# Patient Record
Sex: Female | Born: 1960
Health system: Southern US, Community
[De-identification: ages and names within clinical notes are randomized; demographics above are authoritative.]

## PROBLEM LIST (undated history)

## (undated) DIAGNOSIS — R011 Cardiac murmur, unspecified: Secondary | ICD-10-CM

## (undated) DIAGNOSIS — I341 Nonrheumatic mitral (valve) prolapse: Secondary | ICD-10-CM

## (undated) DIAGNOSIS — N200 Calculus of kidney: Secondary | ICD-10-CM

## (undated) DIAGNOSIS — E785 Hyperlipidemia, unspecified: Secondary | ICD-10-CM

## (undated) DIAGNOSIS — E119 Type 2 diabetes mellitus without complications: Secondary | ICD-10-CM

## (undated) DIAGNOSIS — I10 Essential (primary) hypertension: Secondary | ICD-10-CM

## (undated) HISTORY — PX: BACK SURGERY: SHX140

## (undated) HISTORY — PX: CARDIAC CATHETERIZATION: SHX172

## (undated) HISTORY — PX: BREAST SURGERY: SHX581

## (undated) HISTORY — PX: TONSILLECTOMY: SUR1361

## (undated) HISTORY — PX: TUBAL LIGATION: SHX77

## (undated) HISTORY — PX: TRIGGER FINGER RELEASE: SHX641

## (undated) HISTORY — DX: Hyperlipidemia, unspecified: E78.5

## (undated) HISTORY — PX: CHOLECYSTECTOMY: SHX55

## (undated) HISTORY — PX: CARPAL TUNNEL RELEASE: SHX101

## (undated) HISTORY — DX: Type 2 diabetes mellitus without complications: E11.9

---

## 2001-05-06 ENCOUNTER — Encounter: Admission: RE | Admit: 2001-05-06 | Discharge: 2001-05-06 | Payer: Self-pay | Admitting: Internal Medicine

## 2001-05-06 ENCOUNTER — Encounter: Payer: Self-pay | Admitting: Internal Medicine

## 2001-05-12 ENCOUNTER — Encounter: Payer: Self-pay | Admitting: Urology

## 2001-05-12 ENCOUNTER — Encounter: Admission: RE | Admit: 2001-05-12 | Discharge: 2001-05-12 | Payer: Self-pay | Admitting: Urology

## 2001-05-21 ENCOUNTER — Encounter (HOSPITAL_BASED_OUTPATIENT_CLINIC_OR_DEPARTMENT_OTHER): Payer: Self-pay | Admitting: General Surgery

## 2001-05-23 ENCOUNTER — Ambulatory Visit (HOSPITAL_COMMUNITY): Admission: RE | Admit: 2001-05-23 | Discharge: 2001-05-24 | Payer: Self-pay | Admitting: General Surgery

## 2001-05-23 ENCOUNTER — Encounter (HOSPITAL_BASED_OUTPATIENT_CLINIC_OR_DEPARTMENT_OTHER): Payer: Self-pay | Admitting: General Surgery

## 2001-05-23 ENCOUNTER — Encounter (INDEPENDENT_AMBULATORY_CARE_PROVIDER_SITE_OTHER): Payer: Self-pay | Admitting: *Deleted

## 2001-11-18 ENCOUNTER — Other Ambulatory Visit: Admission: RE | Admit: 2001-11-18 | Discharge: 2001-11-18 | Payer: Self-pay | Admitting: Internal Medicine

## 2005-12-18 ENCOUNTER — Ambulatory Visit (HOSPITAL_COMMUNITY): Admission: RE | Admit: 2005-12-18 | Discharge: 2005-12-18 | Payer: Self-pay | Admitting: Internal Medicine

## 2006-01-08 ENCOUNTER — Encounter: Admission: RE | Admit: 2006-01-08 | Discharge: 2006-01-08 | Payer: Self-pay | Admitting: Internal Medicine

## 2008-03-22 ENCOUNTER — Encounter: Admission: RE | Admit: 2008-03-22 | Discharge: 2008-03-22 | Payer: Self-pay | Admitting: Internal Medicine

## 2008-06-02 ENCOUNTER — Encounter: Admission: RE | Admit: 2008-06-02 | Discharge: 2008-06-02 | Payer: Self-pay | Admitting: Internal Medicine

## 2008-07-23 ENCOUNTER — Ambulatory Visit (HOSPITAL_COMMUNITY): Admission: RE | Admit: 2008-07-23 | Discharge: 2008-07-23 | Payer: Self-pay | Admitting: Urology

## 2008-12-08 ENCOUNTER — Ambulatory Visit (HOSPITAL_COMMUNITY): Admission: RE | Admit: 2008-12-08 | Discharge: 2008-12-08 | Payer: Self-pay | Admitting: Neurosurgery

## 2009-01-12 ENCOUNTER — Ambulatory Visit (HOSPITAL_COMMUNITY): Admission: RE | Admit: 2009-01-12 | Discharge: 2009-01-13 | Payer: Self-pay | Admitting: Neurosurgery

## 2009-04-05 ENCOUNTER — Encounter: Admission: RE | Admit: 2009-04-05 | Discharge: 2009-04-05 | Payer: Self-pay | Admitting: Internal Medicine

## 2010-04-10 ENCOUNTER — Encounter: Admission: RE | Admit: 2010-04-10 | Discharge: 2010-04-10 | Payer: Self-pay | Admitting: Internal Medicine

## 2011-01-14 ENCOUNTER — Encounter: Payer: Self-pay | Admitting: Internal Medicine

## 2011-03-28 ENCOUNTER — Other Ambulatory Visit: Payer: Self-pay | Admitting: Internal Medicine

## 2011-03-28 ENCOUNTER — Ambulatory Visit
Admission: RE | Admit: 2011-03-28 | Discharge: 2011-03-28 | Disposition: A | Discharge status: Home / Self Care | Payer: 59 | Source: Ambulatory Visit | Attending: Internal Medicine | Admitting: Internal Medicine

## 2011-03-28 DIAGNOSIS — M79646 Pain in unspecified finger(s): Secondary | ICD-10-CM

## 2011-04-09 LAB — BASIC METABOLIC PANEL
Calcium: 9.2 mg/dL (ref 8.4–10.5)
Chloride: 109 mEq/L (ref 96–112)
Creatinine, Ser: 0.75 mg/dL (ref 0.4–1.2)
GFR calc non Af Amer: >60 mL/min (ref >60)
Glucose, Bld: 96 mg/dL (ref 70–99)
Potassium: 4.1 mEq/L (ref 3.5–5.1)

## 2011-04-09 LAB — CBC
Hemoglobin: 12.7 g/dL (ref 12.0–15.0)
RDW: 14.1 % (ref 11.5–15.5)

## 2011-04-30 ENCOUNTER — Other Ambulatory Visit: Payer: Self-pay | Admitting: Internal Medicine

## 2011-04-30 DIAGNOSIS — Z1231 Encounter for screening mammogram for malignant neoplasm of breast: Secondary | ICD-10-CM

## 2011-05-04 ENCOUNTER — Ambulatory Visit
Admission: RE | Admit: 2011-05-04 | Discharge: 2011-05-04 | Disposition: A | Discharge status: Home / Self Care | Payer: 59 | Source: Ambulatory Visit | Attending: Internal Medicine | Admitting: Internal Medicine

## 2011-05-04 DIAGNOSIS — Z1231 Encounter for screening mammogram for malignant neoplasm of breast: Secondary | ICD-10-CM

## 2011-05-08 NOTE — Op Note (Signed)
NAME:  Mary Holt, Mary Holt               ACCOUNT NO.:  [id number]   MEDICAL RECORD NO.:  [id number]          PATIENT TYPE:  OIB   LOCATION:  3528                         FACILITY:  [facility]   PHYSICIAN:  [name], M.D.     DATE OF BIRTH:  1961-02-03   DATE OF PROCEDURE:  01/12/2009  DATE OF DISCHARGE:                               OPERATIVE REPORT   PREOPERATIVE DIAGNOSES:  1. Lumbar stenosis L4-5.  2. Right L4-L5 radiculopathy.   POSTOPERATIVE DIAGNOSES:  1. Lumbar stenosis L4-5.  2. Right L4-L5 radiculopathy.   PROCEDURE:  Right L4-5 semi-hemilaminectomy with decompression of L4 and  L5 roots.   COMPLICATIONS:  None.   SURGEON:  [name], MD   ASSISTANT:  Danae Orleans. Venetia Maxon, MD   INDICATIONS:  Mary Holt presented with pain in the right lower  extremity with standing and walking.  Myelogram suggested a hang up at  L4-5 level.  After much discussion, she and I agreed that it might be  worthwhile to undergo decompression.   OPERATIVE NOTE:  [name] was brought to the operating room intubated  and placed under general anesthetic.  Her back was prepped after she was  turned prone onto a Tompkins frame.  All pressure points appropriately  padded.  I infiltrated 0.5% lidocaine with 1:200,000 strength  epinephrine.  I opened the skin with a #10 blade and took this down to  the thoracolumbar fascia.  I then exposed the lamina on the right side  of L4-L5.  After localizing that with x-ray, I then proceeded with a  semi hemi-laminectomy using high-speed drill and Kerrison punches.  I  removed the ligamentum flavum and then thoroughly decompressed the L4-L5  roots.  I then irrigated the wound.  Dr. Venetia Maxon assisted with  decompression.  We then closed the wound in layered fashion using Vicryl  sutures.  Dermabond used for sterile dressing.           ______________________________  [name], M.D.     KC/MEDQ  D:  01/12/2009  T:  01/13/2009  Job:  [id number]

## 2011-05-11 NOTE — Op Note (Signed)
[place]  Patient:    Mary Holt, Mary Holt                      MRN: [id number] Proc. Date: 05/23/01 Adm. Date:  [id number] Attending:  Sonda Primes                           Operative Report  PREOPERATIVE DIAGNOSIS:  Chronic calculous cholecystitis.  POSTOPERATIVE DIAGNOSIS:  Chronic calculous cholecystitis.  PROCEDURE:  Laparoscopic cholecystectomy with intraoperative cholangiogram.  SURGEON:  Luisa Hart L. Lurene Shadow, M.D.  ASSISTANT:  Marnee Spring. Wiliam Ke, M.D.  ANESTHESIA:  General.  CLINICAL NOTE:  The patient is a 50 year old woman presenting with upper abdominal symptoms which on ultrasonography and CT scanning of the abdomen shows cholelithiasis.  The CT scan, though, showed no obstructing nephrolithiasis although she did have a history of kidney stones.  Liver function studies are all within normal limits.  She is now brought to the operating room for a laparoscopic cholecystectomy.  DESCRIPTION OF PROCEDURE:  Following the induction of satisfactory general anesthesia with the patient positioned supinely, the abdomen was prepped and draped routinely.  Open laparoscopy of the umbilicus is created, and a Hasson cannula is inserted.  Visual exploration of the abdomen showed the gallbladder to be chronically scarred, the liver edges sharp, liver surfaces smooth, anterior gastric wall appeared to be normal.  The duodenum was somewhat tethered to the gallbladder and had to be dissected away.  Pelvic organs, the uterus was noted to be normal size.  There were several small fibroids noted. The adnexal structures were not visualized.  None of the small or large intestine appeared to be abnormal.  Under direct vision, epigastric, midclavicular, and anterior axillary ports were placed.  The gallbladder was grasped and retracted cephalad with the patient in the slightly head-up position and tilted toward the left. Dissection carried down to the  region of the ampulla.  After adhesions were removed from the gallbladder, the cystic artery and cystic duct were dissected free and traced up to their entry into the gallbladder.  The cystic artery was doubly clipped and transected.  The cystic duct was clipped proximally and opened.  I inserted a 14-gauge Angiocath into the abdomen and through it passed a Reddick cholangiocatheter, through which I injected one-half strength Hypaque into the cystic duct.  The resulting fluoroscopic cholangiogram showed free flow of contrast into the duodenum, no filling defects, normal caliber of ducts.  The cholangiocatheter was removed and the cystic duct doubly clipped and transected.  The gallbladder was then dissected free from the liver bed using electrocautery and maintaining hemostasis along the course of dissection.  By the end of dissection, the liver bed was again inspected. Additional bleeding points were treated with electrocautery.  The right upper quadrant was thoroughly irrigated with normal saline and aspirated.  The camera removed through the epigastric port and the gallbladder retrieved through the umbilical port without difficulty.  The trocars were then removed under direct vision.  Sponge, instrument, and sharp counts were verified and pneumoperitoneum allowed to deflate with the assistance of Valsalva maneuvers from the anesthetist.  The wounds were then closed in layers as follows: Umbilical wound in two layers with 0 Dexon and 4-0 Dexon.  Epigastric and lateral flank wounds closed with 4-0 Dexon suture.  Then all wounds were then reinforced with Steri-Strips.  Sterile dressings were applied to the wounds, the anesthetic reversed, and  the patient removed from the operating room to the recovery room in stable condition.  She tolerated the procedure well. DD:  05/23/01 TD:  05/23/01 Job: [id number]

## 2011-10-26 ENCOUNTER — Emergency Department (HOSPITAL_COMMUNITY): Payer: 59

## 2011-10-26 ENCOUNTER — Emergency Department (HOSPITAL_COMMUNITY)
Admission: EM | Admit: 2011-10-26 | Discharge: 2011-10-26 | Disposition: A | Discharge status: Home / Self Care | Payer: 59 | Attending: Emergency Medicine | Admitting: Emergency Medicine

## 2011-10-26 DIAGNOSIS — I10 Essential (primary) hypertension: Secondary | ICD-10-CM | POA: Insufficient documentation

## 2011-10-26 DIAGNOSIS — R51 Headache: Secondary | ICD-10-CM | POA: Insufficient documentation

## 2011-10-26 DIAGNOSIS — R209 Unspecified disturbances of skin sensation: Secondary | ICD-10-CM | POA: Insufficient documentation

## 2011-10-26 DIAGNOSIS — G510 Bell's palsy: Secondary | ICD-10-CM | POA: Insufficient documentation

## 2011-10-26 LAB — CBC
HCT: 35.4 % — ABNORMAL LOW (ref 36.0–46.0)
Hemoglobin: 11.8 g/dL — ABNORMAL LOW (ref 12.0–15.0)
MCH: 28.2 pg (ref 26.0–34.0)
MCHC: 33.3 g/dL (ref 30.0–36.0)
Platelets: 301 10*3/uL (ref 150–400)
RBC: 4.18 MIL/uL (ref 3.87–5.11)

## 2011-10-26 LAB — DIFFERENTIAL
Basophils Absolute: 0 10*3/uL (ref 0.0–0.1)
Lymphocytes Relative: 27 % (ref 12–46)
Lymphs Abs: 1.6 10*3/uL (ref 0.7–4.0)
Monocytes Relative: 6 % (ref 3–12)
Neutro Abs: 4 10*3/uL (ref 1.7–7.7)
Neutrophils Relative %: 65 % (ref 43–77)

## 2011-10-26 LAB — BASIC METABOLIC PANEL
CO2: 25 mEq/L (ref 19–32)
Calcium: 9.1 mg/dL (ref 8.4–10.5)
Chloride: 104 mEq/L (ref 96–112)
GFR calc Af Amer: >90 mL/min (ref >90)
GFR calc non Af Amer: >90 mL/min (ref >90)

## 2011-10-26 LAB — POCT I-STAT, CHEM 8
BUN: 13 mg/dL (ref 6–23)
Chloride: 107 mEq/L (ref 96–112)
Hemoglobin: 12.9 g/dL (ref 12.0–15.0)
Potassium: 3.5 mEq/L (ref 3.5–5.1)
TCO2: 22 mmol/L (ref 0–100)

## 2011-10-26 LAB — POCT I-STAT TROPONIN I: Troponin i, poc: 0 ng/mL (ref 0.00–0.08)

## 2013-01-16 ENCOUNTER — Emergency Department (HOSPITAL_COMMUNITY)
Admission: EM | Admit: 2013-01-16 | Discharge: 2013-01-16 | Disposition: A | Discharge status: Home Health | Payer: 59 | Attending: Emergency Medicine | Admitting: Emergency Medicine

## 2013-01-16 ENCOUNTER — Encounter (HOSPITAL_COMMUNITY): Payer: Self-pay | Admitting: *Deleted

## 2013-01-16 ENCOUNTER — Emergency Department (HOSPITAL_COMMUNITY): Payer: 59

## 2013-01-16 DIAGNOSIS — N390 Urinary tract infection, site not specified: Secondary | ICD-10-CM | POA: Insufficient documentation

## 2013-01-16 DIAGNOSIS — N200 Calculus of kidney: Secondary | ICD-10-CM | POA: Insufficient documentation

## 2013-01-16 DIAGNOSIS — I10 Essential (primary) hypertension: Secondary | ICD-10-CM | POA: Insufficient documentation

## 2013-01-16 DIAGNOSIS — R319 Hematuria, unspecified: Secondary | ICD-10-CM | POA: Insufficient documentation

## 2013-01-16 DIAGNOSIS — R112 Nausea with vomiting, unspecified: Secondary | ICD-10-CM | POA: Insufficient documentation

## 2013-01-16 HISTORY — DX: Calculus of kidney: N20.0

## 2013-01-16 HISTORY — DX: Essential (primary) hypertension: I10

## 2013-01-16 LAB — CBC WITH DIFFERENTIAL/PLATELET
Basophils Relative: 0 % (ref 0–1)
Eosinophils Absolute: 0.1 10*3/uL (ref 0.0–0.7)
HCT: 36.7 % (ref 36.0–46.0)
Hemoglobin: 11.8 g/dL — ABNORMAL LOW (ref 12.0–15.0)
MCH: 25.1 pg — ABNORMAL LOW (ref 26.0–34.0)
MCHC: 32.2 g/dL (ref 30.0–36.0)
Monocytes Absolute: 0.9 10*3/uL (ref 0.1–1.0)
Monocytes Relative: 6 % (ref 3–12)

## 2013-01-16 LAB — COMPREHENSIVE METABOLIC PANEL
AST: 15 U/L (ref 0–37)
Albumin: 3.7 g/dL (ref 3.5–5.2)
CO2: 23 mEq/L (ref 19–32)
Calcium: 8.8 mg/dL (ref 8.4–10.5)
Creatinine, Ser: 1.13 mg/dL — ABNORMAL HIGH (ref 0.50–1.10)
GFR calc non Af Amer: 55 mL/min — ABNORMAL LOW (ref >90)

## 2013-01-16 LAB — URINALYSIS, ROUTINE W REFLEX MICROSCOPIC
Bilirubin Urine: NEGATIVE
Glucose, UA: NEGATIVE mg/dL
Ketones, ur: 15 mg/dL — AB
pH: 5.5 (ref 5.0–8.0)

## 2013-01-16 LAB — LIPASE, BLOOD: Lipase: 26 U/L (ref 11–59)

## 2013-01-16 LAB — URINE MICROSCOPIC-ADD ON

## 2013-01-16 LAB — POCT I-STAT, CHEM 8
Calcium, Ion: 1.14 mmol/L (ref 1.12–1.23)
Glucose, Bld: 144 mg/dL — ABNORMAL HIGH (ref 70–99)
HCT: 39 % (ref 36.0–46.0)
Hemoglobin: 13.3 g/dL (ref 12.0–15.0)

## 2013-01-16 MED ORDER — SODIUM CHLORIDE 0.9 % IV SOLN
Freq: Once | INTRAVENOUS | Status: AC
  Administered 2013-01-16: 100 mL/h via INTRAVENOUS

## 2013-01-16 MED ORDER — TAMSULOSIN HCL 0.4 MG PO CAPS
0.4000 mg | ORAL_CAPSULE | Freq: Once | ORAL | Status: AC
  Administered 2013-01-16: 0.4 mg via ORAL
  Filled 2013-01-16: qty 1

## 2013-01-16 MED ORDER — TAMSULOSIN HCL 0.4 MG PO CAPS: 0.4000 mg | ORAL_CAPSULE | Freq: Every day | ORAL | Status: DC

## 2013-01-16 MED ORDER — ONDANSETRON HCL 4 MG/2ML IJ SOLN
4.0000 mg | Freq: Once | INTRAMUSCULAR | Status: AC
  Administered 2013-01-16: 4 mg via INTRAVENOUS
  Filled 2013-01-16: qty 2

## 2013-01-16 MED ORDER — MORPHINE SULFATE 4 MG/ML IJ SOLN
4.0000 mg | Freq: Once | INTRAMUSCULAR | Status: AC
  Administered 2013-01-16: 4 mg via INTRAVENOUS
  Filled 2013-01-16: qty 1

## 2013-01-16 MED ORDER — CIPROFLOXACIN HCL 500 MG PO TABS: 500.0000 mg | ORAL_TABLET | Freq: Two times a day (BID) | ORAL | Status: DC

## 2013-01-16 MED ORDER — OXYCODONE-ACETAMINOPHEN 5-325 MG PO TABS
1.0000 | ORAL_TABLET | Freq: Once | ORAL | Status: AC
  Administered 2013-01-16: 1 via ORAL
  Filled 2013-01-16 (×2): qty 1

## 2013-01-16 MED ORDER — IBUPROFEN 600 MG PO TABS: 600.0000 mg | ORAL_TABLET | Freq: Four times a day (QID) | ORAL | Status: DC | PRN

## 2013-01-16 MED ORDER — ONDANSETRON 8 MG PO TBDP: 8.0000 mg | ORAL_TABLET | Freq: Three times a day (TID) | ORAL | Status: DC | PRN

## 2013-01-16 MED ORDER — OXYCODONE-ACETAMINOPHEN 5-325 MG PO TABS: 1.0000 | ORAL_TABLET | ORAL | Status: DC | PRN

## 2013-01-16 MED ORDER — KETOROLAC TROMETHAMINE 30 MG/ML IJ SOLN
30.0000 mg | Freq: Once | INTRAMUSCULAR | Status: AC
  Administered 2013-01-16: 30 mg via INTRAVENOUS
  Filled 2013-01-16: qty 1

## 2013-01-16 NOTE — ED Notes (Signed)
Patient states left sided flank pain starting since Wednesday, patient states n/v/d associated, patient states history of kidney stones and pain feels similiar

## 2013-01-16 NOTE — ED Provider Notes (Signed)
Medical screening examination/treatment/procedure(s) were performed by non-physician practitioner and as supervising physician I was immediately available for consultation/collaboration.  Sunnie Nielsen, MD 01/16/13 2251

## 2013-01-16 NOTE — ED Provider Notes (Signed)
History     CSN: [id number]  Arrival date & time 01/16/13  0604   First MD Initiated Contact with Patient 01/16/13 [phone]      Chief Complaint  Patient presents with  . Flank Pain    (Consider location/radiation/quality/duration/timing/severity/associated sxs/prior treatment) HPI Comments: Mary Holt is a 52 y.o. Female with a PMHx that presents to ER with c/o flank pain since Wednesday.  She states that it is on the left side only and feels that it might be starting to radiate down to her pubic area.  The pain is intermittent, but when present the patient describes at as stabbing and is currently at a 7-8/10 on the pain scale.  She has tried ibuprofen for the pain with only mild relief, and she states that nothing makes the pain worse.  She has had kidney stones in the past and she states that she feels that this feels similar to those.  She  had nausea and vomiting along with the pain on Wednesday and is also "going to the bathroom more" but denies diarrhea.  She says that she feels like she might be urinating some blood, as well, but is not having any dysuria.   She denies fever, headache, sore throat, body aches, melena, and hematochezia.   Patient is a 52 y.o. female presenting with flank pain. The history is provided by the patient.  Flank Pain This is a new problem. The current episode started in the past 7 days. The problem occurs intermittently. The problem has been unchanged. Associated symptoms include a change in bowel habit, nausea and vomiting. Pertinent negatives include no abdominal pain, anorexia, arthralgias, chest pain, chills, congestion, coughing, diaphoresis, fatigue, fever, headaches, joint swelling, myalgias, neck pain, numbness, rash, sore throat, swollen glands, urinary symptoms, vertigo, visual change or weakness. Nothing aggravates the symptoms. She has tried NSAIDs for the symptoms. The treatment provided mild relief.    Past Medical History  Diagnosis Date  .  Kidney stones   . Hypertension     Past Surgical History  Procedure Date  . Back surgery     No family history on file.  History  Substance Use Topics  . Smoking status: Never Smoker   . Smokeless tobacco: Not on file  . Alcohol Use: No    OB History    Grav Para Term Preterm Abortions TAB SAB Ect Mult Living                  Review of Systems  Constitutional: Negative.  Negative for fever, chills, diaphoresis and fatigue.  HENT: Negative.  Negative for congestion, sore throat and neck pain.   Eyes: Negative.   Respiratory: Negative.  Negative for cough.   Cardiovascular: Negative.  Negative for chest pain.  Gastrointestinal: Positive for nausea, vomiting and change in bowel habit. Negative for abdominal pain, diarrhea, constipation, blood in stool and anorexia.       N/V with flank pain on Wed; states that she is "going to bathroom more" but not really diarrhea  Genitourinary: Positive for hematuria and flank pain. Negative for dysuria, urgency, frequency, decreased urine volume, difficulty urinating and pelvic pain.  Musculoskeletal: Negative.  Negative for myalgias, joint swelling and arthralgias.  Skin: Negative.  Negative for rash.  Neurological: Negative.  Negative for vertigo, weakness, numbness and headaches.  Hematological: Negative.   Psychiatric/Behavioral: Negative.   All other systems reviewed and are negative.    Allergies  Penicillins  Home Medications  No current outpatient prescriptions  on file.  BP 168/95  Pulse 85  Temp 98.5 F (36.9 C) (Oral)  Resp 18  SpO2 98%  Physical Exam  Nursing note and vitals reviewed. Constitutional: She is oriented to person, place, and time. She appears well-developed and well-nourished. She appears distressed.       PT is uncomfortable  HENT:  Head: Normocephalic and atraumatic.  Eyes: Conjunctivae normal and EOM are normal. Pupils are equal, round, and reactive to light. No scleral icterus.  Neck: Normal  range of motion. Neck supple.  Cardiovascular: Normal rate, regular rhythm and normal heart sounds.   Pulmonary/Chest: Effort normal and breath sounds normal. No stridor.  Abdominal: Soft. Bowel sounds are normal. There is no tenderness. There is CVA tenderness.  Genitourinary:       Positive CVA tenderness of left side.  No suprapubic tenderness.  Musculoskeletal: Normal range of motion.  Neurological: She is alert and oriented to person, place, and time.  Skin: Skin is warm and dry. She is not diaphoretic.  Psychiatric: She has a normal mood and affect. Her behavior is normal.    ED Course  Procedures (including critical care time)  Pt with left flank pain, n/v, hx of kidney stones. Will get labs, UA.  Results for orders placed during the hospital encounter of 01/16/13  URINALYSIS, ROUTINE W REFLEX MICROSCOPIC      Component Value Range   Color, Urine YELLOW  YELLOW   APPearance CLOUDY (*) CLEAR   Specific Gravity, Urine 1.017  1.005 - 1.030   pH 5.5  5.0 - 8.0   Glucose, UA NEGATIVE  NEGATIVE mg/dL   Hgb urine dipstick LARGE (*) NEGATIVE   Bilirubin Urine NEGATIVE  NEGATIVE   Ketones, ur 15 (*) NEGATIVE mg/dL   Protein, ur NEGATIVE  NEGATIVE mg/dL   Urobilinogen, UA 0.2  0.0 - 1.0 mg/dL   Nitrite NEGATIVE  NEGATIVE   Leukocytes, UA SMALL (*) NEGATIVE  CBC WITH DIFFERENTIAL      Component Value Range   WBC 15.7 (*) 4.0 - 10.5 K/uL   RBC 4.71  3.87 - 5.11 MIL/uL   Hemoglobin 11.8 (*) 12.0 - 15.0 g/dL   HCT 29.5  28.4 - 13.2 %   MCV 77.9 (*) 78.0 - 100.0 fL   MCH 25.1 (*) 26.0 - 34.0 pg   MCHC 32.2  30.0 - 36.0 g/dL   RDW 44.0 (*) 10.2 - 72.5 %   Platelets 324  150 - 400 K/uL   Neutrophils Relative 84 (*) 43 - 77 %   Neutro Abs 13.2 (*) 1.7 - 7.7 K/uL   Lymphocytes Relative 10 (*) 12 - 46 %   Lymphs Abs 1.5  0.7 - 4.0 K/uL   Monocytes Relative 6  3 - 12 %   Monocytes Absolute 0.9  0.1 - 1.0 K/uL   Eosinophils Relative 0  0 - 5 %   Eosinophils Absolute 0.1  0.0 - 0.7  K/uL   Basophils Relative 0  0 - 1 %   Basophils Absolute 0.0  0.0 - 0.1 K/uL  POCT I-STAT, CHEM 8      Component Value Range   Sodium 139  135 - 145 mEq/L   Potassium 3.8  3.5 - 5.1 mEq/L   Chloride 107  96 - 112 mEq/L   BUN 18  6 - 23 mg/dL   Creatinine, Ser 3.66 (*) 0.50 - 1.10 mg/dL   Glucose, Bld 440 (*) 70 - 99 mg/dL   Calcium, Ion 3.47  1.12 - 1.23 mmol/L   TCO2 23  0 - 100 mmol/L   Hemoglobin 13.3  12.0 - 15.0 g/dL   HCT 16.1  09.6 - 04.5 %  COMPREHENSIVE METABOLIC PANEL      Component Value Range   Sodium 139  135 - 145 mEq/L   Potassium 3.7  3.5 - 5.1 mEq/L   Chloride 104  96 - 112 mEq/L   CO2 23  19 - 32 mEq/L   Glucose, Bld 138 (*) 70 - 99 mg/dL   BUN 17  6 - 23 mg/dL   Creatinine, Ser 4.09 (*) 0.50 - 1.10 mg/dL   Calcium 8.8  8.4 - 81.1 mg/dL   Total Protein 7.1  6.0 - 8.3 g/dL   Albumin 3.7  3.5 - 5.2 g/dL   AST 15  0 - 37 U/L   ALT 15  0 - 35 U/L   Alkaline Phosphatase 74  39 - 117 U/L   Total Bilirubin 0.3  0.3 - 1.2 mg/dL   GFR calc non Af Amer 55 (*) >90 mL/min   GFR calc Af Amer 64 (*) >90 mL/min  LIPASE, BLOOD      Component Value Range   Lipase 26  11 - 59 U/L  URINE MICROSCOPIC-ADD ON      Component Value Range   Squamous Epithelial / LPF FEW (*) RARE   WBC, UA 11-20  <3 WBC/hpf   RBC / HPF 3-6  <3 RBC/hpf   Bacteria, UA MANY (*) RARE    Suspect a kidney stone vs pyelonephritis. Will get CT    Ct Abdomen Pelvis Wo Contrast  01/16/2013  *RADIOLOGY REPORT*  Clinical Data: Left flank pain.  CT ABDOMEN AND PELVIS WITHOUT CONTRAST  Technique:  Multidetector CT imaging of the abdomen and pelvis was performed following the standard protocol without intravenous contrast.  Comparison: 07/15/2008  Findings: Minimal dependent atelectasis in the lung bases.  Heart is upper limits normal in size.  No effusions.  Prior cholecystectomy.  Liver, spleen, pancreas, adrenals have an unremarkable unenhanced appearance.  Bilateral nephrolithiasis.  No hydronephrosis  or ureteral stones on the right.  Mild left hydronephrosis and perinephric stranding.  There are numerous calcifications in the pelvis, most of which are stable since 2009 and compatible with phleboliths.  I suspect there are two distal left ureteral stones adjacent to one another, both measuring approximately 6 mm in diameter.  Urinary bladder is unremarkable.  Low density centrally within the uterus, presumably fluid within the endometrium.  No adnexal masses.  No free fluid, free air or adenopathy.  Stomach, large and small bowel are grossly unremarkable.  No acute bony abnormality.  IMPRESSION: Mild left hydronephrosis and perinephric stranding.  Extensive calcifications in the pelvis compatible with phleboliths.  There are 2 new calcifications in the region of the distal left ureter/left UVJ which I suspect are adjacent 6 mm distal left ureteral stones.   Original Report Authenticated By: Charlett Nose, M.D.      1. Kidney stone on left side   2. UTI (lower urinary tract infection)       MDM  PT with left sided flank pain, n/v, hematuria. Hx of kidney stones. Pt does have leukocytosis with contaminated, possibly infected UA. CT obtained to r/o kidney stone, and shows a 6mm stone at UVJ. Pt's pain improved in ED with 30mg  of toradol and 4mg  of morphine IV. She received a dose of flomax and percocet as well. I discussed pt with Dr. Retta Diones  who recommend treatment with cipro, pain medications, follow up in the office on Monday. [facility] to [facility] ED or call on call physician if fever, worsening pain, vomiting. Discussed with pt. She agrees with the plan. At this time afebrile, non toxic. No vomiting in ED.   Filed Vitals:   01/16/13 0939  BP: 132/79  Pulse: 85  Temp:   Resp: 18           Marra Fraga A Shylo Dillenbeck, PA 01/16/13 1243

## 2013-01-18 LAB — URINE CULTURE

## 2013-01-19 NOTE — ED Notes (Signed)
+  Urine. Patient treated with Cipro. Sensitive to same. Per protocol MD. °

## 2013-03-23 ENCOUNTER — Other Ambulatory Visit: Payer: Self-pay | Admitting: Internal Medicine

## 2013-03-23 DIAGNOSIS — M549 Dorsalgia, unspecified: Secondary | ICD-10-CM

## 2013-03-26 ENCOUNTER — Ambulatory Visit
Admission: RE | Admit: 2013-03-26 | Discharge: 2013-03-26 | Disposition: A | Discharge status: Home / Self Care | Payer: 59 | Source: Ambulatory Visit | Attending: Internal Medicine | Admitting: Internal Medicine

## 2013-03-26 DIAGNOSIS — M549 Dorsalgia, unspecified: Secondary | ICD-10-CM

## 2013-03-26 MED ORDER — GADOBENATE DIMEGLUMINE 529 MG/ML IV SOLN
15.0000 mL | Freq: Once | INTRAVENOUS | Status: AC | PRN
  Administered 2013-03-26: 15 mL via INTRAVENOUS

## 2013-04-28 ENCOUNTER — Other Ambulatory Visit: Payer: Self-pay | Admitting: Neurosurgery

## 2013-04-30 ENCOUNTER — Encounter (HOSPITAL_COMMUNITY): Payer: Self-pay | Admitting: Pharmacy Technician

## 2013-05-06 ENCOUNTER — Ambulatory Visit (HOSPITAL_COMMUNITY)
Admission: RE | Admit: 2013-05-06 | Discharge: 2013-05-06 | Disposition: A | Discharge status: Home / Self Care | Payer: 59 | Source: Ambulatory Visit | Attending: Anesthesiology | Admitting: Anesthesiology

## 2013-05-06 ENCOUNTER — Encounter (HOSPITAL_COMMUNITY): Payer: Self-pay

## 2013-05-06 ENCOUNTER — Encounter (HOSPITAL_COMMUNITY)
Admission: RE | Admit: 2013-05-06 | Discharge: 2013-05-06 | Disposition: A | Discharge status: Home / Self Care | Payer: 59 | Source: Ambulatory Visit | Attending: Neurosurgery | Admitting: Neurosurgery

## 2013-05-06 DIAGNOSIS — Z01812 Encounter for preprocedural laboratory examination: Secondary | ICD-10-CM | POA: Insufficient documentation

## 2013-05-06 DIAGNOSIS — I10 Essential (primary) hypertension: Secondary | ICD-10-CM | POA: Insufficient documentation

## 2013-05-06 DIAGNOSIS — Z01818 Encounter for other preprocedural examination: Secondary | ICD-10-CM | POA: Insufficient documentation

## 2013-05-06 DIAGNOSIS — Z01810 Encounter for preprocedural cardiovascular examination: Secondary | ICD-10-CM | POA: Insufficient documentation

## 2013-05-06 HISTORY — DX: Cardiac murmur, unspecified: R01.1

## 2013-05-06 HISTORY — DX: Nonrheumatic mitral (valve) prolapse: I34.1

## 2013-05-06 LAB — BASIC METABOLIC PANEL
CO2: 25 mEq/L (ref 19–32)
Calcium: 9.3 mg/dL (ref 8.4–10.5)
Chloride: 104 mEq/L (ref 96–112)
Creatinine, Ser: 0.82 mg/dL (ref 0.50–1.10)
GFR calc Af Amer: >90 mL/min (ref >90)
Sodium: 140 mEq/L (ref 135–145)

## 2013-05-06 LAB — SURGICAL PCR SCREEN
MRSA, PCR: NEGATIVE
Staphylococcus aureus: POSITIVE — AB

## 2013-05-06 LAB — ABO/RH: ABO/RH(D): A POS

## 2013-05-06 LAB — CBC
HCT: 35.5 % — ABNORMAL LOW (ref 36.0–46.0)
MCV: 78 fL (ref 78.0–100.0)
Platelets: 432 10*3/uL — ABNORMAL HIGH (ref 150–400)
RBC: 4.55 MIL/uL (ref 3.87–5.11)
RDW: 15 % (ref 11.5–15.5)
WBC: 9.1 10*3/uL (ref 4.0–10.5)

## 2013-05-06 LAB — TYPE AND SCREEN: Antibody Screen: NEGATIVE

## 2013-05-06 NOTE — Pre-Procedure Instructions (Signed)
NIANG MITCHELTREE  05/06/2013   Your procedure is scheduled on:  Friday, May 16th.  Report to [facility] at 7:30 AM.  Call this number if you have problems the morning of surgery: [phone]   Remember:   Do not eat food or drink liquids after midnight.   Take these medicines the morning of surgery with A SIP OF WATER: Metorprolo.   Do not wear jewelry, make-up or nail polish.  Do not wear lotions, powders, or perfumes. You may wear deodorant.  Do not shave 48 hours prior to surgery.   Do not bring valuables to the hospital.  Contacts, dentures or bridgework may not be worn into surgery.  Leave suitcase in the car. After surgery it may be brought to your room.  For patients admitted to the hospital, checkout time is 11:00 AM the day of discharge.     Special Instructions: Shower using CHG 2 nights before surgery and the night before surgery.  If you shower the day of surgery use CHG.  Use special wash - you have one bottle of CHG for all showers.  You should use approximately 1/3 of the bottle for each shower.   Please read over the following fact sheets that you were given: Pain Booklet, Coughing and Deep Breathing, Blood Transfusion Information and Surgical Site Infection Prevention

## 2013-05-07 MED ORDER — VANCOMYCIN HCL IN DEXTROSE 1-5 GM/200ML-% IV SOLN
1000.0000 mg | INTRAVENOUS | Status: AC
  Administered 2013-05-08: 1000 mg via INTRAVENOUS
  Filled 2013-05-07: qty 200

## 2013-05-08 ENCOUNTER — Encounter (HOSPITAL_COMMUNITY): Payer: Self-pay | Admitting: Anesthesiology

## 2013-05-08 ENCOUNTER — Encounter (HOSPITAL_COMMUNITY): Payer: Self-pay | Admitting: *Deleted

## 2013-05-08 ENCOUNTER — Inpatient Hospital Stay (HOSPITAL_COMMUNITY)
Admission: RE | Admit: 2013-05-08 | Discharge: 2013-05-13 | DRG: 460 | Disposition: A | Discharge status: Home / Self Care | Payer: 59 | Source: Ambulatory Visit | Attending: Neurosurgery | Admitting: Neurosurgery

## 2013-05-08 ENCOUNTER — Ambulatory Visit (HOSPITAL_COMMUNITY): Payer: 59 | Admitting: Anesthesiology

## 2013-05-08 ENCOUNTER — Encounter (HOSPITAL_COMMUNITY)
Admission: RE | Disposition: A | Discharge status: Home / Self Care | Payer: Self-pay | Source: Ambulatory Visit | Attending: Neurosurgery

## 2013-05-08 ENCOUNTER — Ambulatory Visit (HOSPITAL_COMMUNITY): Payer: 59

## 2013-05-08 DIAGNOSIS — I059 Rheumatic mitral valve disease, unspecified: Secondary | ICD-10-CM | POA: Diagnosis present

## 2013-05-08 DIAGNOSIS — I10 Essential (primary) hypertension: Secondary | ICD-10-CM | POA: Diagnosis present

## 2013-05-08 DIAGNOSIS — K5900 Constipation, unspecified: Secondary | ICD-10-CM | POA: Diagnosis present

## 2013-05-08 DIAGNOSIS — M48062 Spinal stenosis, lumbar region with neurogenic claudication: Principal | ICD-10-CM | POA: Diagnosis present

## 2013-05-08 DIAGNOSIS — Z87442 Personal history of urinary calculi: Secondary | ICD-10-CM

## 2013-05-08 DIAGNOSIS — Q762 Congenital spondylolisthesis: Secondary | ICD-10-CM

## 2013-05-08 SURGERY — POSTERIOR LUMBAR FUSION 1 LEVEL
Anesthesia: General | Site: Back | Wound class: Clean

## 2013-05-08 MED ORDER — LIDOCAINE-EPINEPHRINE 0.5 %-1:200000 IJ SOLN
INTRAMUSCULAR | Status: DC | PRN
  Administered 2013-05-08: 10 mL

## 2013-05-08 MED ORDER — BISACODYL 5 MG PO TBEC
5.0000 mg | DELAYED_RELEASE_TABLET | Freq: Every day | ORAL | Status: DC | PRN
  Administered 2013-05-11: 5 mg via ORAL
  Filled 2013-05-08: qty 1

## 2013-05-08 MED ORDER — METOPROLOL SUCCINATE ER 25 MG PO TB24
25.0000 mg | ORAL_TABLET | Freq: Every day | ORAL | Status: DC
  Administered 2013-05-08 – 2013-05-13 (×5): 25 mg via ORAL
  Filled 2013-05-08 (×6): qty 1

## 2013-05-08 MED ORDER — LACTATED RINGERS IV SOLN
INTRAVENOUS | Status: DC | PRN
  Administered 2013-05-08 (×4): via INTRAVENOUS

## 2013-05-08 MED ORDER — PHENOL 1.4 % MT LIQD: 1.0000 | OROMUCOSAL | Status: DC | PRN

## 2013-05-08 MED ORDER — METOCLOPRAMIDE HCL 5 MG/ML IJ SOLN: 10.0000 mg | Freq: Once | INTRAMUSCULAR | Status: DC | PRN

## 2013-05-08 MED ORDER — HYDROMORPHONE HCL PF 1 MG/ML IJ SOLN
0.2500 mg | INTRAMUSCULAR | Status: DC | PRN
  Administered 2013-05-08 (×4): 0.5 mg via INTRAVENOUS

## 2013-05-08 MED ORDER — 0.9 % SODIUM CHLORIDE (POUR BTL) OPTIME
TOPICAL | Status: DC | PRN
  Administered 2013-05-08: 1000 mL

## 2013-05-08 MED ORDER — SODIUM CHLORIDE 0.9 % IJ SOLN: 3.0000 mL | INTRAMUSCULAR | Status: DC | PRN

## 2013-05-08 MED ORDER — PHENYLEPHRINE HCL 10 MG/ML IJ SOLN
INTRAMUSCULAR | Status: DC | PRN
  Administered 2013-05-08 (×2): 80 ug via INTRAVENOUS
  Administered 2013-05-08: 40 ug via INTRAVENOUS

## 2013-05-08 MED ORDER — PROPOFOL 10 MG/ML IV BOLUS
INTRAVENOUS | Status: DC | PRN
  Administered 2013-05-08: 150 mg via INTRAVENOUS
  Administered 2013-05-08: 50 mg via INTRAVENOUS

## 2013-05-08 MED ORDER — MIDAZOLAM HCL 5 MG/5ML IJ SOLN
INTRAMUSCULAR | Status: DC | PRN
  Administered 2013-05-08: 2 mg via INTRAVENOUS

## 2013-05-08 MED ORDER — SODIUM CHLORIDE 0.9 % IV SOLN
250.0000 mL | INTRAVENOUS | Status: DC
  Administered 2013-05-11: 250 mL via INTRAVENOUS

## 2013-05-08 MED ORDER — OXYCODONE HCL 5 MG PO TABS
5.0000 mg | ORAL_TABLET | ORAL | Status: DC | PRN
  Administered 2013-05-08 – 2013-05-10 (×8): 10 mg via ORAL
  Administered 2013-05-10: 5 mg via ORAL
  Administered 2013-05-11 – 2013-05-13 (×10): 10 mg via ORAL
  Filled 2013-05-08 (×19): qty 2

## 2013-05-08 MED ORDER — THROMBIN 20000 UNITS EX SOLR
CUTANEOUS | Status: DC | PRN
  Administered 2013-05-08: 12:00:00 via TOPICAL

## 2013-05-08 MED ORDER — OXYCODONE HCL 5 MG PO TABS
5.0000 mg | ORAL_TABLET | Freq: Once | ORAL | Status: AC | PRN
  Administered 2013-05-08: 5 mg via ORAL

## 2013-05-08 MED ORDER — FENTANYL CITRATE 0.05 MG/ML IJ SOLN
INTRAMUSCULAR | Status: DC | PRN
  Administered 2013-05-08: 50 ug via INTRAVENOUS
  Administered 2013-05-08: 100 ug via INTRAVENOUS
  Administered 2013-05-08 (×4): 50 ug via INTRAVENOUS
  Administered 2013-05-08: 150 ug via INTRAVENOUS

## 2013-05-08 MED ORDER — OXYCODONE HCL 5 MG PO TABS
ORAL_TABLET | ORAL | Status: AC
  Filled 2013-05-08: qty 1

## 2013-05-08 MED ORDER — OXYCODONE HCL 5 MG/5ML PO SOLN: 5.0000 mg | Freq: Once | ORAL | Status: AC | PRN

## 2013-05-08 MED ORDER — HYDROMORPHONE HCL PF 1 MG/ML IJ SOLN
INTRAMUSCULAR | Status: AC
  Filled 2013-05-08: qty 1

## 2013-05-08 MED ORDER — SENNA 8.6 MG PO TABS
1.0000 | ORAL_TABLET | Freq: Two times a day (BID) | ORAL | Status: DC
  Administered 2013-05-08 – 2013-05-13 (×7): 8.6 mg via ORAL
  Filled 2013-05-08 (×11): qty 1

## 2013-05-08 MED ORDER — VANCOMYCIN HCL 10 G IV SOLR
1250.0000 mg | Freq: Once | INTRAVENOUS | Status: AC
  Administered 2013-05-08: 1250 mg via INTRAVENOUS
  Filled 2013-05-08: qty 1250

## 2013-05-08 MED ORDER — ONDANSETRON HCL 4 MG/2ML IJ SOLN
4.0000 mg | INTRAMUSCULAR | Status: DC | PRN
  Administered 2013-05-09 (×2): 4 mg via INTRAVENOUS
  Filled 2013-05-08 (×3): qty 2

## 2013-05-08 MED ORDER — PHENYLEPHRINE HCL 10 MG/ML IJ SOLN
10.0000 mg | INTRAVENOUS | Status: DC | PRN
  Administered 2013-05-08: 25 ug/min via INTRAVENOUS

## 2013-05-08 MED ORDER — POLYETHYLENE GLYCOL 3350 17 G PO PACK
17.0000 g | PACK | Freq: Every day | ORAL | Status: DC | PRN
  Filled 2013-05-08: qty 1

## 2013-05-08 MED ORDER — MENTHOL 3 MG MT LOZG: 1.0000 | LOZENGE | OROMUCOSAL | Status: DC | PRN

## 2013-05-08 MED ORDER — POTASSIUM CHLORIDE IN NACL 20-0.9 MEQ/L-% IV SOLN
INTRAVENOUS | Status: DC
  Administered 2013-05-09: 06:00:00 via INTRAVENOUS
  Filled 2013-05-08 (×11): qty 1000

## 2013-05-08 MED ORDER — ROCURONIUM BROMIDE 100 MG/10ML IV SOLN
INTRAVENOUS | Status: DC | PRN
  Administered 2013-05-08: 60 mg via INTRAVENOUS
  Administered 2013-05-08: 20 mg via INTRAVENOUS

## 2013-05-08 MED ORDER — ACETAMINOPHEN 10 MG/ML IV SOLN
1000.0000 mg | Freq: Four times a day (QID) | INTRAVENOUS | Status: AC
  Administered 2013-05-08 – 2013-05-09 (×4): 1000 mg via INTRAVENOUS
  Filled 2013-05-08 (×4): qty 100

## 2013-05-08 MED ORDER — DIPHENHYDRAMINE HCL 50 MG/ML IJ SOLN
25.0000 mg | Freq: Four times a day (QID) | INTRAMUSCULAR | Status: DC | PRN
  Administered 2013-05-08: 25 mg via INTRAVENOUS
  Administered 2013-05-11: 50 mg via INTRAVENOUS
  Administered 2013-05-12 (×2): 25 mg via INTRAVENOUS
  Filled 2013-05-08 (×4): qty 1

## 2013-05-08 MED ORDER — SODIUM CHLORIDE 0.9 % IJ SOLN
3.0000 mL | Freq: Two times a day (BID) | INTRAMUSCULAR | Status: DC
  Administered 2013-05-10 – 2013-05-12 (×5): 3 mL via INTRAVENOUS

## 2013-05-08 MED ORDER — LIDOCAINE HCL (CARDIAC) 20 MG/ML IV SOLN
INTRAVENOUS | Status: DC | PRN
  Administered 2013-05-08: 50 mg via INTRAVENOUS

## 2013-05-08 MED ORDER — MORPHINE SULFATE 2 MG/ML IJ SOLN
2.0000 mg | INTRAMUSCULAR | Status: DC | PRN
  Administered 2013-05-08 – 2013-05-10 (×6): 2 mg via INTRAVENOUS
  Administered 2013-05-10: 4 mg via INTRAVENOUS
  Filled 2013-05-08 (×3): qty 1
  Filled 2013-05-08: qty 2
  Filled 2013-05-08 (×3): qty 1

## 2013-05-08 SURGICAL SUPPLY — 69 items
ADH SKN CLS APL DERMABOND .7 (GAUZE/BANDAGES/DRESSINGS) ×1
APL SKNCLS STERI-STRIP NONHPOA (GAUZE/BANDAGES/DRESSINGS)
BENZOIN TINCTURE PRP APPL 2/3 (GAUZE/BANDAGES/DRESSINGS) IMPLANT
BLADE SURG ROTATE 9660 (MISCELLANEOUS) IMPLANT
BONE MATRIX OSTEOCEL PLUS 5CC (Bone Implant) ×2 IMPLANT
BUR MATCHSTICK NEURO 3.0 LAGG (BURR) ×2 IMPLANT
CAGE COROENT 12X28X4 (Cage) ×2 IMPLANT
CANISTER SUCTION 2500CC (MISCELLANEOUS) ×2 IMPLANT
CLOTH BEACON ORANGE TIMEOUT ST (SAFETY) ×2 IMPLANT
CONT SPEC 4OZ CLIKSEAL STRL BL (MISCELLANEOUS) ×4 IMPLANT
COVER BACK TABLE 24X17X13 BIG (DRAPES) IMPLANT
COVER TABLE BACK 60X90 (DRAPES) ×2 IMPLANT
DECANTER SPIKE VIAL GLASS SM (MISCELLANEOUS) IMPLANT
DERMABOND ADVANCED (GAUZE/BANDAGES/DRESSINGS) ×1
DERMABOND ADVANCED .7 DNX12 (GAUZE/BANDAGES/DRESSINGS) ×1 IMPLANT
DRAPE C-ARM 42X72 X-RAY (DRAPES) ×4 IMPLANT
DRAPE C-ARMOR (DRAPES) ×4 IMPLANT
DRAPE LAPAROTOMY 100X72X124 (DRAPES) ×2 IMPLANT
DRAPE POUCH INSTRU U-SHP 10X18 (DRAPES) ×2 IMPLANT
DRAPE SURG 17X23 STRL (DRAPES) ×2 IMPLANT
DRESSING TELFA 8X3 (GAUZE/BANDAGES/DRESSINGS) IMPLANT
DURAPREP 26ML APPLICATOR (WOUND CARE) ×2 IMPLANT
ELECT REM PT RETURN 9FT ADLT (ELECTROSURGICAL) ×2
ELECTRODE REM PT RTRN 9FT ADLT (ELECTROSURGICAL) ×1 IMPLANT
GAUZE SPONGE 4X4 16PLY XRAY LF (GAUZE/BANDAGES/DRESSINGS) IMPLANT
GLOVE BIOGEL PI IND STRL 6.5 (GLOVE) ×1 IMPLANT
GLOVE BIOGEL PI IND STRL 7.5 (GLOVE) ×3 IMPLANT
GLOVE BIOGEL PI INDICATOR 6.5 (GLOVE) ×1
GLOVE BIOGEL PI INDICATOR 7.5 (GLOVE) ×3
GLOVE ECLIPSE 6.5 STRL STRAW (GLOVE) ×6 IMPLANT
GLOVE ECLIPSE 7.5 STRL STRAW (GLOVE) ×8 IMPLANT
GLOVE ECLIPSE 8.5 STRL (GLOVE) ×2 IMPLANT
GLOVE EXAM NITRILE LRG STRL (GLOVE) IMPLANT
GLOVE EXAM NITRILE MD LF STRL (GLOVE) ×4 IMPLANT
GLOVE EXAM NITRILE XL STR (GLOVE) IMPLANT
GLOVE EXAM NITRILE XS STR PU (GLOVE) IMPLANT
GLOVE SURG SS PI 6.5 STRL IVOR (GLOVE) ×2 IMPLANT
GOWN BRE IMP SLV AUR LG STRL (GOWN DISPOSABLE) ×6 IMPLANT
GOWN BRE IMP SLV AUR XL STRL (GOWN DISPOSABLE) ×6 IMPLANT
GOWN STRL REIN 2XL LVL4 (GOWN DISPOSABLE) IMPLANT
KIT BASIN OR (CUSTOM PROCEDURE TRAY) ×2 IMPLANT
KIT POSITION SURG JACKSON T1 (MISCELLANEOUS) IMPLANT
KIT ROOM TURNOVER OR (KITS) ×2 IMPLANT
NEEDLE HYPO 25X1 1.5 SAFETY (NEEDLE) ×2 IMPLANT
NEEDLE SPNL 18GX3.5 QUINCKE PK (NEEDLE) ×2 IMPLANT
NS IRRIG 1000ML POUR BTL (IV SOLUTION) ×2 IMPLANT
PACK LAMINECTOMY NEURO (CUSTOM PROCEDURE TRAY) ×2 IMPLANT
PAD ARMBOARD 7.5X6 YLW CONV (MISCELLANEOUS) ×6 IMPLANT
ROD 5.5X40MM (Rod) ×2 IMPLANT
ROD PREBENT 45MM LUMBAR (Rod) ×2 IMPLANT
SCREW LOCK (Screw) ×8 IMPLANT
SCREW LOCK FXNS SPNE MAS PL (Screw) ×4 IMPLANT
SCREW SHANK 5.0X30MM (Screw) ×8 IMPLANT
SCREW TULIP 5.5 (Screw) ×8 IMPLANT
SLEEVE SURGEON STRL (DRAPES) ×2 IMPLANT
SPONGE GAUZE 4X4 12PLY (GAUZE/BANDAGES/DRESSINGS) IMPLANT
SPONGE LAP 4X18 X RAY DECT (DISPOSABLE) IMPLANT
SPONGE SURGIFOAM ABS GEL 100 (HEMOSTASIS) ×2 IMPLANT
STRIP CLOSURE SKIN 1/2X4 (GAUZE/BANDAGES/DRESSINGS) IMPLANT
SUT PROLENE 6 0 BV (SUTURE) IMPLANT
SUT VIC AB 0 CT1 18XCR BRD8 (SUTURE) ×1 IMPLANT
SUT VIC AB 0 CT1 8-18 (SUTURE) ×2
SUT VIC AB 2-0 CT1 18 (SUTURE) ×2 IMPLANT
SUT VIC AB 3-0 SH 8-18 (SUTURE) ×2 IMPLANT
SYR 20ML ECCENTRIC (SYRINGE) ×2 IMPLANT
TOWEL OR 17X24 6PK STRL BLUE (TOWEL DISPOSABLE) ×2 IMPLANT
TOWEL OR 17X26 10 PK STRL BLUE (TOWEL DISPOSABLE) ×2 IMPLANT
TRAY FOLEY CATH 14FRSI W/METER (CATHETERS) ×2 IMPLANT
WATER STERILE IRR 1000ML POUR (IV SOLUTION) ×2 IMPLANT

## 2013-05-08 NOTE — H&P (Signed)
  BP 168/93  Pulse 87  Temp(Src) 97.1 F (36.2 C) (Oral)  Resp 20  SpO2 99%  LMP 04/23/2013 CC:L4/5 stenosis Mary Holt presents with pain in the lumbar spine, and severe stenosis at L4/5 and spondylolisthesis at the same level. She had previously undergone a lumbar decompression at L4/5 and had done well for a number of years. Now she has symptoms consistent with neurogenic claudication and radiculopathy due to lateral recess stenosis.  Allergies  Allergen Reactions  . Nabumetone Rash  . Penicillins Rash   Past Medical History  Diagnosis Date  . Kidney stones   . Hypertension   . Heart murmur   . Mitral valve prolapse   . Kidney stones     several times, last  one 2014   Past Surgical History  Procedure Laterality Date  . Back surgery    . Trigger finger release      left thumb  . Tonsillectomy    . Breast surgery      cyst right breast  . Tubal ligation     Prior to Admission medications   Medication Sig Start Date End Date Taking? Authorizing Provider  metoprolol succinate (TOPROL-XL) 25 MG 24 hr tablet Take 25 mg by mouth daily.   Yes Historical Provider, MD   Physical Exam  Constitutional: She is oriented to person, place, and time. She appears well-developed and well-nourished. No distress.  HENT:  Head: Normocephalic and atraumatic.  Right Ear: External ear normal.  Left Ear: External ear normal.  Nose: Nose normal.  Mouth/Throat: Oropharynx is clear and moist.  Eyes: Conjunctivae and EOM are normal. Pupils are equal, round, and reactive to light.  Neck: Normal range of motion. Neck supple.  Cardiovascular: Normal rate, regular rhythm and normal heart sounds.   Pulmonary/Chest: Effort normal and breath sounds normal.  Abdominal: Soft. Bowel sounds are normal.  Neurological: She is alert and oriented to person, place, and time. She has normal reflexes. She displays normal reflexes. No cranial nerve deficit. She exhibits normal muscle tone. Coordination normal.   Skin: Skin is warm and dry.  Psychiatric: She has a normal mood and affect. Her behavior is normal. Judgment and thought content normal.  Assessment/Plan Mary Holt has had conservative treatment without pain relief. She had decided

## 2013-05-08 NOTE — Anesthesia Procedure Notes (Signed)
Procedure Name: Intubation Date/Time: 05/08/2013 11:48 AM Performed by: Alanda Amass A Pre-anesthesia Checklist: Patient identified, Timeout performed, Emergency Drugs available, Patient being monitored and Suction available Patient Re-evaluated:Patient Re-evaluated prior to inductionOxygen Delivery Method: Circle system utilized Preoxygenation: Pre-oxygenation with 100% oxygen Intubation Type: IV induction Ventilation: Mask ventilation without difficulty Laryngoscope Size: Mac and 3 Grade View: Grade I Tube type: Oral Tube size: 7.5 mm Number of attempts: 1 Airway Equipment and Method: Stylet Placement Confirmation: ETT inserted through vocal cords under direct vision,  breath sounds checked- equal and bilateral and positive ETCO2 Secured at: 21 cm Tube secured with: Tape Dental Injury: Teeth and Oropharynx as per pre-operative assessment

## 2013-05-08 NOTE — Anesthesia Postprocedure Evaluation (Signed)
Anesthesia Post Note  Patient: Mary Holt  Procedure(s) Performed: Procedure(s) (LRB): POSTERIOR LUMBAR FUSION 1 LEVEL (N/A)  Anesthesia type: General  Patient location: PACU  Post pain: Pain level controlled and Adequate analgesia  Post assessment: Post-op Vital signs reviewed, Patient's Cardiovascular Status Stable, Respiratory Function Stable, Patent Airway and Pain level controlled  Last Vitals:  Filed Vitals:   05/08/13 1555  BP: 164/97  Pulse: 94  Temp:   Resp: 16    Post vital signs: Reviewed and stable  Level of consciousness: awake, alert  and oriented  Complications: No apparent anesthesia complications

## 2013-05-08 NOTE — Op Note (Addendum)
05/08/2013  3:49 PM  PATIENT:  Mary Holt  52 y.o. female with pain in the lower back and left buttocks. She has symptoms which are consistent with neurogenic claudication and left lumbar radiculopathy. He does have some complaints on the right but they are much less and much he complains of the left side appeared I. therefore plan on taking her to the operating room for a left-sided decompression and pedicle screw fixation at L4-5. He does have spondylolisthesis present at L4-5.  PRE-OPERATIVE DIAGNOSIS:  Spondylolisthesis L4-5, lumbar stenosis L4-5  POST-OPERATIVE DIAGNOSIS:  Spondylolisthesis L4 far, lumbar stenosis L4-5  PROCEDURE:  Procedure(s): POSTERIOR LUMBAR iNTERBODY FUSION 1 LEVEL L4-5, Peek cage placement Nuvasive, 12mm Lumbar decompression beyond what is necessary for a PLIF L4-5, decompression of the L4 and L5 nerve rootS Pedicle screw fixation L4/5, non segmental, medial to lateral trajectory. [email or web address]  SURGEON:  Surgeon(s): Carmela Hurt, MD Temple Pacini, MD  ASSISTANTS:Pool, Sherilyn Cooter  ANESTHESIA:   general  EBL:  Total I/O In: 2900 [I.V.:2900] Out: 1000 [Urine:850; Blood:150]  BLOOD ADMINISTERED:none  CELL SAVER GIVEN:none   COUNT:per nursing  DRAINS: none   SPECIMEN:  No Specimen  DICTATION: Mary Holt was taken to the operating room, intubated and placed under a general anesthetic without difficulty. She was positioned prone on a Reznick frame with all pressure points properly padded. Her back was prepped and draped in a sterile manner. I used her old incision as a starting point. I opened this with a 10 blade and then exposed the lamina of L3-L4 and L5 bilaterally. I exposed the pars intra-articularis of L4 and L5 bilaterally. I then brought the fluoroscopy unit into the operative field.  With fluoroscopic guidance I placed medial to lateral pedicle screws at L4 and L5. I used nuvasive hardware. I used the drill to create a pilot hole, then the  hand drill to create a pathway for the screw the check the integrity of the each final pilot hole and did not appreciate a pedicle cutout. I tapped each hole with a 5.0 mm tap, check the integrity of the pedicle once more, then placed the screws.  The screws were assessed in both the AP and lateral planes and all appeared to be in good position.  I then saw to my decompression. I use a high-speed drill to perform a complete inferior facetectomy of L4 on the left side and partial facetectomy of the superior facet of L5 to I was able to then identify the disc space after removing the ligamentum flavum and exposing the thecal sac. I performed a discectomy at the L4-5 space on the left side. I did not do this on the right side as I had previously done a discectomy there, and most of her pain was on the left side. I prepared the disc space for arthrodesis with a combination of cataracts disc space shavers, rasps, and Kerrison punches. I noted the space. I then placed a 12 mm cage with morselized autograft. I also placed autograft into the disc space prior to the cage placement.  I completed my decompression of the L4 root on the left side with a combination of Kerrison punches and the high-speed drill. I completely took down the facet well beyond the disc space to make sure that there was room for the free egress of the L4 root and of the L5 root. I did use some bone work on the right side removing a portion of the superior facets of L5  and the inferior facet of L4 I did this to ensure that there was no bony compression,  there appeared to be none on the preoperative studies. With the decompression now complete and the posterior lumbar interbody arthrodesis complete I turned my attention to the posterolateral arthrodesis. I decorticated the L4-5 facet on the right side appeared then placed morselized allograft over the decorticated bone  I placed the rods and locking caps for the screw rod system. I secured  everything into position. I then irrigated. I then closed the wound but Dr. Lindalou Hose assistance in layered fashion reapproximating the thoracolumbar subcutaneous and subcuticular layers. I used Dermabond for a sterile dressing.  PLAN OF CARE: Admit to inpatient   PATIENT DISPOSITION:  PACU - hemodynamically stable.   Delay start of Pharmacological VTE agent (>24hrs) due to surgical blood loss or risk of bleeding:  yes

## 2013-05-08 NOTE — Transfer of Care (Signed)
Immediate Anesthesia Transfer of Care Note  Patient: Mary Holt  Procedure(s) Performed: Procedure(s) with comments: POSTERIOR LUMBAR FUSION 1 LEVEL (N/A) - Posterior Lumbar Four-Five Interbody and Fusion with Interbody Prothesis, Posterolateral Arthrodesis, and Posterior Nonsegmental Instrumentation  Patient Location: PACU  Anesthesia Type:General  Level of Consciousness: sedated  Airway & Oxygen Therapy: Patient Spontanous Breathing and Patient connected to nasal cannula oxygen  Post-op Assessment: Report given to PACU RN and Post -op Vital signs reviewed and stable  Post vital signs: Reviewed and stable  Complications: No apparent anesthesia complications

## 2013-05-08 NOTE — Anesthesia Preprocedure Evaluation (Addendum)
Anesthesia Evaluation  Patient identified by MRN, date of birth, ID band Patient awake    Reviewed: Allergy & Precautions, H&P , NPO status , Patient's Chart, lab work & pertinent test results, reviewed documented beta blocker date and time   Airway Mallampati: II TM Distance: >3 FB Neck ROM: full    Dental  (+) Dental Advisory Given   Pulmonary neg pulmonary ROS,  breath sounds clear to auscultation        Cardiovascular hypertension, On Medications and On Home Beta Blockers + Valvular Problems/Murmurs MVP Rhythm:regular     Neuro/Psych negative neurological ROS  negative psych ROS   GI/Hepatic negative GI ROS, Neg liver ROS,   Endo/Other  negative endocrine ROS  Renal/GU Renal disease  negative genitourinary   Musculoskeletal   Abdominal   Peds  Hematology negative hematology ROS (+)   Anesthesia Other Findings See surgeon's H&P   Reproductive/Obstetrics negative OB ROS                          Anesthesia Physical Anesthesia Plan  ASA: II  Anesthesia Plan: General   Post-op Pain Management:    Induction: Intravenous  Airway Management Planned: Oral ETT  Additional Equipment:   Intra-op Plan:   Post-operative Plan: Extubation in OR  Informed Consent: I have reviewed the patients History and Physical, chart, labs and discussed the procedure including the risks, benefits and alternatives for the proposed anesthesia with the patient or authorized representative who has indicated his/her understanding and acceptance.   Dental Advisory Given  Plan Discussed with: CRNA and Surgeon  Anesthesia Plan Comments:         Anesthesia Quick Evaluation

## 2013-05-09 NOTE — Progress Notes (Signed)
Occupational Therapy Evaluation   05/09/13 1400  OT Visit Information  Last OT Received On 05/09/13  Assistance Needed +1  OT Time Calculation  OT Start Time 1432  OT Stop Time 1453  OT Time Calculation (min) 21 min  Precautions  Precautions Back  Precaution Booklet Issued No  Precaution Comments Reviewed 3/3 back precautions with pt and family.  Required Braces or Orthoses Spinal Brace  Spinal Brace Lumbar corset;Applied in sitting position  Home Living  Lives With Alone  Available Help at Discharge Family;Available PRN/intermittently (will be alone part of day)  Type of Home House  Home Access Stairs to enter  Entrance Stairs-Number of Steps 4  Entrance Stairs-Rails Right;Left;Can reach both  Home Layout One level  Bathroom Shower/Tub Tub/shower unit;Curtain  Data processing manager None  Prior Function  Level of Independence Independent  Able to Take Stairs? Yes  Driving Yes  Vocation Full time employment  Communication  Communication No difficulties  ADL  Eating/Feeding Performed;Independent  Where Assessed - Eating/Feeding Edge of bed  Upper Body Bathing Simulated;Minimal assistance  Where Assessed - Upper Body Bathing Unsupported sitting  Lower Body Bathing Simulated;Moderate assistance  Where Assessed - Lower Body Bathing Supported sit to stand  Upper Body Dressing Performed;Minimal assistance  Where Assessed - Upper Body Dressing Unsupported sitting  Lower Body Dressing Simulated;Maximal assistance  Where Assessed - Lower Body Dressing Supported sit to Ecologist;Moderate assistance  Toilet Transfer Method (ambulating)  Toilet Transfer Equipment (bed to ambulate in room)  Equipment Used Gait belt;Back brace;Rolling walker  Transfers/Ambulation Related to ADLs min guard to ambulate in room. PT arrived while ambulating to door, and pt continued ambulating with PT.  ADL Comments Pt requiring increased time due to  pain.   Cognition  Arousal/Alertness Awake/alert  Behavior During Therapy WFL for tasks assessed/performed  Overall Cognitive Status Within Functional Limits for tasks assessed  Right Upper Extremity Assessment  RUE ROM/Strength/Tone WFL for tasks assessed  Left Upper Extremity Assessment  LUE ROM/Strength/Tone WFL for tasks assessed  Bed Mobility  Bed Mobility Rolling Left;Left Sidelying to Sit;Sitting - Scoot to [place] of Bed  Rolling Left 4: Min assist;With rail  Left Sidelying to Sit 3: Mod assist  Sitting - Scoot to Edge of Bed 5: Supervision  Details for Bed Mobility Assistance VCs for log roll technique. Assist to support trunk OOB.  Transfers  Transfers Sit to Stand  Sit to Stand 3: Mod assist;From bed;With upper extremity assist  Details for Transfer Assistance Assist for power up from bed.  OT - End of Session  Equipment Utilized During Treatment Gait belt;Back brace  Activity Tolerance Patient tolerated treatment well  Patient left with family/visitor present (ambulating with PT)  Nurse Communication Mobility status  OT Assessment  Clinical Impression Statement Pt s/p L4-5 PLIF.  Pt will benefit from continued OT services to address below problem list.   OT Recommendation/Assessment Patient needs continued OT Services  OT Problem List Decreased strength;Decreased activity tolerance;Decreased knowledge of use of DME or AE;Decreased knowledge of precautions;Pain  OT Therapy Diagnosis  Generalized weakness;Acute pain  OT Plan  OT Frequency Min 2X/week  OT Treatment/Interventions Self-care/ADL training;DME and/or AE instruction;Therapeutic activities;Patient/family education  OT Recommendation  Follow Up Recommendations Home health OT;Supervision - Intermittent  OT Equipment 3 in 1 bedside comode;Tub/shower bench  Individuals Consulted  Consulted and Agree with Results and Recommendations Patient  Acute Rehab OT Goals  OT Goal Formulation With patient  Time For Goal  Achievement 05/16/13  Potential to Achieve Goals Good  ADL Goals  Pt Will Perform Grooming with modified independence;Standing at sink  Pt Will Transfer to Toilet with modified independence;Ambulation;with DME;Comfort height toilet;Maintaining back safety precautions  Pt Will Perform Toileting - Clothing Manipulation with modified independence;Sitting on 3-in-1 or toilet;Standing  Pt Will Perform Toileting - Hygiene with modified independence;Sit to stand from 3-in-1/toilet  Pt Will Perform Lower Body Dressing with modified independence;Sit to stand from chair;Sit to stand from bed;with adaptive equipment  Pt Will Perform Lower Body Bathing with modified independence;Sit to stand from bed;Sit to stand from chair;with adaptive equipment  ADL Goal: Grooming - Progress Goal set today  ADL Goal: Lower Body Bathing - Progress Goal set today  ADL Goal: Lower Body Dressing - Progress Goal set today  ADL Goal: Toilet Transfer - Progress Goal set today  ADL Goal: Toileting - Clothing Manipulation - Progress Goal set today  ADL Goal: Toileting - Hygiene - Progress Goal set today  Miscellaneous OT Goals  Miscellaneous OT Goal #1 Pt will perform bed mobility at mod I level as precursor for EOB ADLs.  OT Goal: Miscellaneous Goal #1 - Progress Goal set today                 Written Expression  Dominant Hand Right  05/09/2013 Cipriano Mile OTR/L Pager [phone] Office [phone]

## 2013-05-09 NOTE — Progress Notes (Signed)
Pt. Had episode while in the bathroom with nurse tech; became nauseated, then diaphoretic and had to lean against bathroom sink with NT holding pt. To stay standing. Pt. Assisted to sit down and then assisted back to bed. BP 97/59; pulse 86. Pt. Alert and oriented x4 after sitting down. MAEW. Zofran and Morphine given. Metoprolol held secondary to low BP. Dr. Jordan Likes on unit and made aware; came and saw patient. Will monitor.

## 2013-05-09 NOTE — Progress Notes (Signed)
Postop day 1. Patient complains of significant incisional pain has some discomfort in her groin bilaterally but no true radiating pain. No complaints of numbness or paresthesias. No bowel pain.  Afebrile. Vitals are stable. Urine output good. On exam she is awake and alert. She is oriented and appropriate. She is obviously uncomfortable. Examination of her chest and abdomen are benign. Motor examination intact bilaterally. Sensory examination nonfocal. Dressing dry.  Status post lumbar decompression and fusion. Progressing reasonably well. Continue efforts at mobilization and pain control.

## 2013-05-09 NOTE — Evaluation (Signed)
Physical Therapy Evaluation Patient Details Name: Mary Holt MRN: [id number] DOB: 1961-10-02 Today's Date: 05/09/2013 Time: 4098-1191 PT Time Calculation (min): 25 min  PT Assessment / Plan / Recommendation Clinical Impression  Patient is a 52 yo female s/p L4-5 PLIF.  Patient with pain and weakness impacting mobility.  Will benefit from acute PT to maximize independence prior to discharge  home.  Patient reports that she will be alone for part of the day - sister will come prn during day.  Recommend HHPT to continue therapy at discharge.    PT Assessment  Patient needs continued PT services    Follow Up Recommendations  Home health PT;Supervision - Intermittent    Does the patient have the potential to tolerate intense rehabilitation      Barriers to Discharge Decreased caregiver support      Equipment Recommendations  Rolling walker with 5" wheels    Recommendations for Other Services     Frequency Min 5X/week    Precautions / Restrictions Precautions Precautions: Back Precaution Comments: Patient able to recall 3/3 back precautions from earlier session.  Talked with patient about how precautions will limit her ability to take care of her dog, carrying things in house, etc.  Discussed using bag on RW to carry things. Required Braces or Orthoses: Spinal Brace Spinal Brace: Lumbar corset;Applied in sitting position Restrictions Weight Bearing Restrictions: No   Pertinent Vitals/Pain       Mobility  Bed Mobility Bed Mobility: Not assessed Rolling Left: 4: Min assist;With rail Left Sidelying to Sit: 3: Mod assist Sitting - Scoot to Edge of Bed: 5: Supervision Details for Bed Mobility Assistance: VCs for log roll technique. Assist to support trunk OOB. Transfers Transfers: Stand to Sit Sit to Stand: 3: Mod assist;From bed;With upper extremity assist Stand to Sit: 4: Min assist;With upper extremity assist;With armrests;To chair/3-in-1 Details for Transfer  Assistance: Verbal cues for hand placement and technique.  Assist to control descent to chair. Ambulation/Gait Ambulation/Gait Assistance: 4: Min guard Ambulation Distance (Feet): 122 Feet Assistive device: Rolling walker Ambulation/Gait Assistance Details: Verbal cues for safe use of RW.  Cues to stand upright during gait.  Slow, guarded gait. Gait Pattern: Step-through pattern;Decreased step length - right;Decreased step length - left Gait velocity: Slow gait speed    Exercises     PT Diagnosis: Difficulty walking;Abnormality of gait;Generalized weakness;Acute pain  PT Problem List: Decreased strength;Decreased activity tolerance;Decreased balance;Decreased mobility;Decreased knowledge of use of DME;Decreased knowledge of precautions;Pain PT Treatment Interventions: DME instruction;Gait training;Stair training;Functional mobility training;Patient/family education   PT Goals Acute Rehab PT Goals PT Goal Formulation: With patient Time For Goal Achievement: 05/16/13 Potential to Achieve Goals: Good Pt will Roll Supine to Right Side: Independently PT Goal: Rolling Supine to Right Side - Progress: Goal set today Pt will Roll Supine to Left Side: Independently PT Goal: Rolling Supine to Left Side - Progress: Goal set today Pt will go Supine/Side to Sit: Independently;with HOB 0 degrees PT Goal: Supine/Side to Sit - Progress: Goal set today Pt will go Sit to Supine/Side: Independently;with HOB 0 degrees PT Goal: Sit to Supine/Side - Progress: Goal set today Pt will go Sit to Stand: with modified independence;with upper extremity assist PT Goal: Sit to Stand - Progress: Goal set today Pt will Ambulate: >150 feet;with modified independence;with rolling walker PT Goal: Ambulate - Progress: Goal set today Pt will Go Up / Down Stairs: 3-5 stairs;with supervision;with rail(s);with least restrictive assistive device PT Goal: Up/Down Stairs - Progress: Goal set today  Additional Goals Additional  Goal #1: Patient will recall 3/3 back precautions and integrate into functional mobility. PT Goal: Additional Goal #1 - Progress: Goal set today  Visit Information  Last PT Received On: 05/09/13 Assistance Needed: +1    Subjective Data  Subjective: "I'm feeling better up walking" Patient Stated Goal: To go home soon.   Prior Functioning  Home Living Lives With: Alone Available Help at Discharge: Family;Available PRN/intermittently (Reports she will be alone for part of the day.) Type of Home: House Home Access: Stairs to enter [employer] of Steps: 4 Entrance Stairs-Rails: Right;Left;Can reach both Home Layout: One level Bathroom Shower/Tub: Forensic scientist: Standard Home Adaptive Equipment: None Prior Function Level of Independence: Independent Able to Take Stairs?: Yes Driving: Yes Vocation: Full time employment Communication Communication: No difficulties    Cognition  Cognition Arousal/Alertness: Awake/alert Behavior During Therapy: WFL for tasks assessed/performed Overall Cognitive Status: Within Functional Limits for tasks assessed    Extremity/Trunk Assessment Right Upper Extremity Assessment RUE ROM/Strength/Tone: [facility] for tasks assessed Left Upper Extremity Assessment LUE ROM/Strength/Tone: WFL for tasks assessed Right Lower Extremity Assessment RLE ROM/Strength/Tone: WFL for tasks assessed RLE Sensation: WFL - Light Touch Left Lower Extremity Assessment LLE ROM/Strength/Tone: WFL for tasks assessed LLE Sensation: WFL - Light Touch   Balance    End of Session PT - End of Session Equipment Utilized During Treatment: Gait belt;Back brace Activity Tolerance: Patient limited by pain;Patient limited by fatigue Patient left: in chair;with call bell/phone within reach;with family/visitor present Nurse Communication: Mobility status  GP     Vena Austria 05/09/2013, 3:50 PM Durenda Hurt. Renaldo Fiddler, [facility] Acute Rehab  Services Pager [phone]

## 2013-05-09 NOTE — Progress Notes (Signed)
Orthopedic Tech Progress Note Patient Details:  Mary Holt 10/14/1961 [id number]  Patient ID: Mary Holt, female   DOB: 07-Feb-1961, 52 y.o.   MRN: [id number]   Mary Holt 05/09/2013, 4:25 PM L-S support completed by bio-tech .

## 2013-05-09 NOTE — Progress Notes (Signed)
Vomited x1 moderate amount in 20 ounce cup(cup was full), yellow vomitus around 12:40am.Zofran given Placed on clear liquid diet

## 2013-05-10 NOTE — Progress Notes (Signed)
Physical Therapy Treatment Patient Details Name: Mary Holt MRN: [id number] DOB: 09-23-1961 Today's Date: 05/10/2013 Time: 4098-1191 PT Time Calculation (min): 35 min  PT Assessment / Plan / Recommendation Comments on Treatment Session  Patient making slow progress.  Encouraged patient to have assist at home especially initially (sister and son to help).  Pain continues to limit mobility.    Follow Up Recommendations  Home health PT;Supervision - Intermittent     Does the patient have the potential to tolerate intense rehabilitation     Barriers to Discharge        Equipment Recommendations  Rolling walker with 5" wheels    Recommendations for Other Services    Frequency Min 5X/week   Plan Discharge plan remains appropriate;Frequency remains appropriate    Precautions / Restrictions Precautions Precautions: Back Precaution Comments: Able to recall 3/3 back precautions Required Braces or Orthoses: Spinal Brace Spinal Brace: Lumbar corset;Applied in sitting position Restrictions Weight Bearing Restrictions: No   Pertinent Vitals/Pain     Mobility  Bed Mobility Bed Mobility: Rolling Right;Right Sidelying to Sit;Sitting - Scoot to Edge of Bed Rolling Right: 4: Min assist;With rail Right Sidelying to Sit: 4: Min assist;With rails;HOB flat Sitting - Scoot to Edge of Bed: 5: Supervision Details for Bed Mobility Assistance: Verbal cues for technique.  Patient takes increased time to complete tasks due to pain level. Transfers Transfers: Sit to Stand;Stand to Sit Sit to Stand: 4: Min assist;With upper extremity assist;From bed;From toilet Stand to Sit: 4: Min assist;With upper extremity assist;To toilet;With armrests;To chair/3-in-1 Details for Transfer Assistance: Verbal cues for technique. Ambulation/Gait Ambulation/Gait Assistance: 4: Min guard Ambulation Distance (Feet): 160 Feet Assistive device: Rolling walker Ambulation/Gait Assistance Details: Patient with  upright posture.  Gait is slow and guarded. Gait Pattern: Step-through pattern;Decreased step length - right;Decreased step length - left Gait velocity: Slow gait speed      PT Goals Acute Rehab PT Goals Pt will Roll Supine to Right Side: Independently PT Goal: Rolling Supine to Right Side - Progress: Progressing toward goal Pt will Roll Supine to Left Side: Independently PT Goal: Rolling Supine to Left Side - Progress: Progressing toward goal Pt will go Supine/Side to Sit: Independently;with HOB 0 degrees PT Goal: Supine/Side to Sit - Progress: Progressing toward goal Pt will go Sit to Stand: with modified independence;with upper extremity assist PT Goal: Sit to Stand - Progress: Progressing toward goal Pt will Ambulate: >150 feet;with modified independence;with rolling walker PT Goal: Ambulate - Progress: Progressing toward goal Additional Goals Additional Goal #1: Patient will recall 3/3 back precautions and integrate into functional mobility. PT Goal: Additional Goal #1 - Progress: Progressing toward goal  Visit Information  Last PT Received On: 05/10/13 Assistance Needed: +1 PT/OT Co-Evaluation/Treatment: Yes    Subjective Data  Subjective: "It's worse when I'm getting in and out of bed."   Cognition  Cognition Arousal/Alertness: Awake/alert Behavior During Therapy: WFL for tasks assessed/performed Overall Cognitive Status: Within Functional Limits for tasks assessed    Balance     End of Session PT - End of Session Equipment Utilized During Treatment: Gait belt;Back brace Activity Tolerance: Patient limited by pain Patient left: in chair;with call bell/phone within reach;with family/visitor present Nurse Communication: Mobility status   GP     Vena Austria 05/10/2013, 1:25 PM Durenda Hurt. Renaldo Fiddler, [facility] Acute Rehab Services Pager [phone]

## 2013-05-10 NOTE — Progress Notes (Signed)
Postop day 2. Pain control still difficult but improved from yesterday. No further nausea and vomiting. No flatus however.  Afebrile. Vitals are stable. Motor and sensory exam intact. Wound dressing clean and dry. Abdomen soft nontender hypoactive bowel sounds.  Progressing reasonably well. Mobilize further. Possible discharge Monday or Tuesday.

## 2013-05-10 NOTE — Progress Notes (Signed)
Occupational Therapy Treatment Patient Details Name: Mary Holt MRN: [id number] DOB: 1961-06-28 Today's Date: 05/10/2013 Time: 7829-5621 OT Time Calculation (min): 14 min  OT Assessment / Plan / Recommendation Comments on Treatment Session Pt slowly making progress. Updated plan to include 24/7 sup/assist at d/c.  Will plan to focus on AE education and tub transfer next session.    Follow Up Recommendations  Home health OT;Supervision/Assistance - 24 hour    Barriers to Discharge       Equipment Recommendations  3 in 1 bedside comode;Tub/shower bench    Recommendations for Other Services    Frequency Min 2X/week   Plan Discharge plan needs to be updated    Precautions / Restrictions Precautions Precautions: Back Precaution Comments: Able to recall 3/3 back precautions Required Braces or Orthoses: Spinal Brace Spinal Brace: Lumbar corset;Applied in sitting position Restrictions Weight Bearing Restrictions: No   Pertinent Vitals/Pain See vitals    ADL  Grooming: Performed;Wash/dry hands;Supervision/safety Where Assessed - Grooming: Unsupported standing Toilet Transfer: Performed;Minimal assistance Statistician Method:  (ambulating) Acupuncturist: Comfort height toilet;Grab bars Toileting - Clothing Manipulation and Hygiene: Performed;Min guard Where Assessed - Engineer, mining and Hygiene: Sit to stand from 3-in-1 or toilet Equipment Used: Back brace;Gait belt Transfers/Ambulation Related to ADLs: min guard with RW. very slow due to pain. ADL Comments: Pt too painful to focus on AE education.  Assisted pt with toileting task.  RN present during session to administer pain meds.  Recommended to pt that she have family plan to stay with her 24/7 initially, and pt reported her sister could stay with her.    OT Diagnosis:    OT Problem List:   OT Treatment Interventions:     OT Goals ADL Goals Pt Will Perform Grooming: with modified  independence;Standing at sink ADL Goal: Grooming - Progress: Progressing toward goals Pt Will Transfer to Toilet: with modified independence;Ambulation;with DME;Comfort height toilet;Maintaining back safety precautions ADL Goal: Toilet Transfer - Progress: Progressing toward goals Pt Will Perform Toileting - Clothing Manipulation: with modified independence;Sitting on 3-in-1 or toilet;Standing ADL Goal: Toileting - Clothing Manipulation - Progress: Progressing toward goals Pt Will Perform Toileting - Hygiene: with modified independence;Sit to stand from 3-in-1/toilet ADL Goal: Toileting - Hygiene - Progress: Progressing toward goals Pt Will Perform Tub/Shower Transfer: Tub transfer;with modified independence;Ambulation;with DME;Transfer tub bench;Maintaining back safety precautions ADL Goal: Tub/Shower Transfer - Progress: Goal set today  Visit Information  Last OT Received On: 05/10/13 Assistance Needed: +1    Subjective Data      Prior Functioning       Cognition  Cognition Arousal/Alertness: Awake/alert Behavior During Therapy: WFL for tasks assessed/performed Overall Cognitive Status: Within Functional Limits for tasks assessed    Mobility  Bed Mobility Bed Mobility: Not assessed (sitting EOB with PT on arrival)  Transfers Transfers: Sit to Stand;Stand to Sit Sit to Stand: 4: Min assist;From bed;From toilet;With upper extremity assist Stand to Sit: 4: Min assist;To toilet;With upper extremity assist Details for Transfer Assistance: VCs for sequencing and hand placement.    Exercises      Balance     End of Session OT - End of Session Equipment Utilized During Treatment: Back brace;Gait belt Activity Tolerance: Patient limited by pain Patient left:  (ambulating with PT) Nurse Communication: Patient requests pain meds  GO    05/10/2013 Cipriano Mile OTR/L Pager [phone] Office [phone]  Cipriano Mile 05/10/2013, 1:46 PM

## 2013-05-10 NOTE — Progress Notes (Signed)
Utilization Review Completed.   Jontez Redfield, RN, BSN Nurse Case Manager  [phone]  

## 2013-05-11 MED ORDER — MAGNESIUM CITRATE PO SOLN
1.0000 | Freq: Once | ORAL | Status: AC
  Administered 2013-05-11: 1 via ORAL
  Filled 2013-05-11: qty 296

## 2013-05-11 NOTE — Progress Notes (Signed)
Patient ID: Mary Holt, female   DOB: 1961/05/04, 52 y.o.   MRN: [id number] BP 111/81  Pulse 100  Temp(Src) 98.5 F (36.9 C) (Oral)  Resp 17  Ht 5\' 3"  (1.6 m)  Wt 78.019 kg (172 lb)  BMI 30.48 kg/m2  SpO2 100%  LMP 04/23/2013 Alert and oriented x 4 Pain in lower extremities bilaterally Still with pain in lower back. Wound is clean and dry Constipated, will order magnesium citrate.

## 2013-05-11 NOTE — Progress Notes (Signed)
   CARE MANAGEMENT NOTE 05/11/2013  Patient:  Mary Holt, Mary Holt   Account Number:  [id number]  Date Initiated:  05/11/2013  Documentation initiated by:  [facility]  Subjective/Objective Assessment:   POSTERIOR LUMBAR iNTERBODY FUSION 1 LEVEL L4-5     Action/Plan:   Anticipated DC Date:  05/11/2013   Anticipated DC Plan:        DC Planning Services  CM consult      [facility] Choice  HOME HEALTH   Choice offered to / List presented to:  C-1 Patient   DME arranged  3-N-1  Levan Hurst      DME agency  [facility]        [facility] agency  [facility]   Status of service:  Completed, signed off Medicare Important Message given?   (If response is "NO", the following Medicare IM given date fields will be blank) Date Medicare IM given:   Date Additional Medicare IM given:    Discharge Disposition:  HOME W HOME HEALTH SERVICES  Per UR Regulation:    If discussed at Long Length of Stay Meetings, dates discussed:    Comments:  05/11/2013 1435 NCM spoke to pt and offered choice for [facility]. Provided list for [facility]. Pt is requesting [facility] for HH. Notified [facility] for HH. Waiting orders for [facility] per MD orders. Isidoro Donning RN CCM Case Mgmt phone [phone]

## 2013-05-11 NOTE — Progress Notes (Signed)
Physical Therapy Treatment Patient Details Name: Mary Holt MRN: [id number] DOB: 11/24/1961 Today's Date: 05/11/2013 Time: 4098-1191 PT Time Calculation (min): 36 min  PT Assessment / Plan / Recommendation Comments on Treatment Session  Pt continues to progress slowly but was able to ambulate 200 feet and ascend/ descend 5 stairs today as well as perform bed mobility without physical assist. She is experiencing some abdominal pain as a result of no BM x 4 days and is hoping this is resolved before d/c. Rec HHPT, PT will continue to follow.    Follow Up Recommendations  Home health PT;Supervision - Intermittent     Does the patient have the potential to tolerate intense rehabilitation     Barriers to Discharge        Equipment Recommendations  Rolling walker with 5" wheels    Recommendations for Other Services    Frequency Min 5X/week   Plan Discharge plan remains appropriate;Frequency remains appropriate    Precautions / Restrictions Precautions Precautions: Back Precaution Comments: recalled 3/3 precautions but needed occasional vc's to prevent twisting with some mobility Required Braces or Orthoses: Spinal Brace Spinal Brace: Lumbar corset;Applied in sitting position Restrictions Weight Bearing Restrictions: No   Pertinent Vitals/Pain 8/10 back pain, decreased with mobility    Mobility  Bed Mobility Bed Mobility: Rolling Right;Right Sidelying to Sit;Sit to Sidelying Right;Rolling Left Rolling Right: 5: Supervision Rolling Left: 5: Supervision Right Sidelying to Sit: 5: Supervision;HOB flat Sitting - Scoot to Edge of Bed: 6: Modified independent (Device/Increase time) Sit to Sidelying Right: 5: Supervision;HOB flat Details for Bed Mobility Assistance: practiced bed mobility into and out of bed. Pt able to perform with bed flat and no rails and no physical assist but vc's needed to guide mvmt to prevent pain. When getting into bed, it helped pt greatly to leave both  knees in bridged position while rolling onto back and then pillow placed under knees. When coming out of bed, ot cued to get all the way onto her side and get legs fully off bed before trying to sit up. She experienced much less pain with bed mobility this afternoon than she had experienced this AM Transfers Transfers: Sit to Stand;Stand to Sit Sit to Stand: 5: Supervision;From bed;With upper extremity assist Stand to Sit: 5: Supervision;To chair/3-in-1;With upper extremity assist Details for Transfer Assistance: pt attempted to stand pushing from bed and was unable to do so without physical assist. Then she tried one hand on RW but this caused twisting as well as concern of therapist that RW would tilt. The only way she could get up without assist was with both hands on RW pushing straight down. Allowed her to do this but educated that she needs to progress to both hands on bed as she is able. Ambulation/Gait Ambulation/Gait Assistance: 5: Supervision Ambulation Distance (Feet): 200 Feet Assistive device: Rolling walker Ambulation/Gait Assistance Details: vc's for not holding breath at end of each inhalation, for relaxing shoulders, and for equal step length, pt sliding left foot on floor instead of picking it up and only stepping it up to right. Able to correct with vc's Gait Pattern: Step-to pattern;Decreased stride length;Decreased dorsiflexion - left;Decreased weight shift to left;Decreased step length - left;Shuffle Gait velocity: decreased Stairs: Yes Stairs Assistance: 5: Supervision Stairs Assistance Details (indicate cue type and reason): pt felt most comfortable and could keep precautions best when facing rail and holding with both hands Stair Management Technique: One rail Left;Sideways;Step to pattern Number of Stairs: Social worker  Mobility: No    Exercises     PT Diagnosis:    PT Problem List:   PT Treatment Interventions:     PT Goals Acute Rehab PT  Goals PT Goal Formulation: With patient Time For Goal Achievement: 05/16/13 Potential to Achieve Goals: Good Pt will Roll Supine to Right Side: Independently PT Goal: Rolling Supine to Right Side - Progress: Progressing toward goal Pt will Roll Supine to Left Side: Independently PT Goal: Rolling Supine to Left Side - Progress: Progressing toward goal Pt will go Supine/Side to Sit: Independently;with HOB 0 degrees PT Goal: Supine/Side to Sit - Progress: Progressing toward goal Pt will go Sit to Supine/Side: Independently;with HOB 0 degrees PT Goal: Sit to Supine/Side - Progress: Progressing toward goal Pt will go Sit to Stand: with modified independence;with upper extremity assist PT Goal: Sit to Stand - Progress: Progressing toward goal Pt will Ambulate: >150 feet;with modified independence;with rolling walker PT Goal: Ambulate - Progress: Progressing toward goal Pt will Go Up / Down Stairs: 3-5 stairs;with supervision;with rail(s);with least restrictive assistive device PT Goal: Up/Down Stairs - Progress: Met Additional Goals Additional Goal #1: Patient will recall 3/3 back precautions and integrate into functional mobility. PT Goal: Additional Goal #1 - Progress: Progressing toward goal  Visit Information  Last PT Received On: 05/11/13 Assistance Needed: +1    Subjective Data  Subjective: I just had to scream this morning when I got out of bed Patient Stated Goal: To go home soon.   Cognition  Cognition Arousal/Alertness: Awake/alert Behavior During Therapy: WFL for tasks assessed/performed Overall Cognitive Status: Within Functional Limits for tasks assessed    Balance  Balance Balance Assessed: Yes Dynamic Standing Balance Dynamic Standing - Balance Support: Bilateral upper extremity supported;During functional activity Dynamic Standing - Level of Assistance: 5: Stand by assistance  End of Session PT - End of Session Equipment Utilized During Treatment: Gait belt;Back  brace Activity Tolerance: Patient tolerated treatment well Patient left: in chair;with call bell/phone within reach Nurse Communication: Mobility status   GP   Lyanne Co, PT  Acute Rehab Services  [phone]   Lyanne Co 05/11/2013, 3:52 PM

## 2013-05-11 NOTE — Progress Notes (Signed)
Occupational Therapy Treatment Patient Details Name: Mary Holt MRN: [id number] DOB: 05/05/1961 Today's Date: 05/11/2013 Time: 4098-1191 OT Time Calculation (min): 37 min  OT Assessment / Plan / Recommendation Comments on Treatment Session Pt to complete tub transfer next session. Pt progressing well and remains slow progression with RW use. Pt with decr gait velocity. Pt completed grooming task at sink level     Follow Up Recommendations  Home health OT;Supervision/Assistance - 24 hour    Barriers to Discharge       Equipment Recommendations  3 in 1 bedside comode;Tub/shower bench    Recommendations for Other Services    Frequency Min 2X/week   Plan Discharge plan needs to be updated    Precautions / Restrictions Precautions Precautions: Back Precaution Comments: Able to recall 3/3 back precautions Required Braces or Orthoses: Spinal Brace Spinal Brace: Lumbar corset;Applied in sitting position Restrictions Weight Bearing Restrictions: No   Pertinent Vitals/Pain 7 out 10 (back and BIL LE pain)    ADL  Eating/Feeding: Modified independent Where Assessed - Eating/Feeding: Chair Grooming: Wash/dry face;Teeth care;Supervision/safety Where Assessed - Grooming: Unsupported standing Lower Body Bathing: Supervision/safety Where Assessed - Lower Body Bathing: Unsupported standing (LT UE on sink for support) Upper Body Dressing: Modified independent Where Assessed - Upper Body Dressing: Unsupported sitting Lower Body Dressing: Minimal assistance (with AE use and (A) for sit<>Stand) Where Assessed - Lower Body Dressing: Supported sit to Pharmacist, hospital: Minimal assistance Statistician Method: Sit to Barista: Regular height toilet;Grab bars Toileting - Clothing Manipulation and Hygiene: Minimal assistance Where Assessed - Engineer, mining and Hygiene: Sit to stand from 3-in-1 or toilet Equipment Used: Gait belt;Back  brace;Rolling walker Transfers/Ambulation Related to ADLs: Pt ambulated with Lt LE first in RW sequence due to weakness. pt with decr gait velocity ADL Comments: pt completed bed mobility, toileting, sink level grooming and sitting in chair for AE education. Pt educated on hand placement for toilet transfer and could benefit from 3n1. Pt educated on need to sponge bath upon d./c home at this time. Pt to be further educated on tub transfer. Pt with deficits single leg standing this session so Ot educated on sponge bath. Pt educated on need for sister (A) for bathing and to avoid bath tub until therapist has practiced if d/c prior to 05/12/13. Pt able to use reacher to don pants. Pt recalled all precautions. Handout provided and house hold adls (kitchen bathroom and dressing reviewed for height saftey with precautions)    OT Diagnosis:    OT Problem List:   OT Treatment Interventions:     OT Goals Acute Rehab OT Goals OT Goal Formulation: With patient Time For Goal Achievement: 05/16/13 Potential to Achieve Goals: Good ADL Goals Pt Will Perform Grooming: with modified independence;Standing at sink ADL Goal: Grooming - Progress: Progressing toward goals Pt Will Perform Lower Body Bathing: with modified independence;Sit to stand from bed;Sit to stand from chair;with adaptive equipment ADL Goal: Lower Body Bathing - Progress: Progressing toward goals Pt Will Perform Lower Body Dressing: with modified independence;Sit to stand from chair;Sit to stand from bed;with adaptive equipment ADL Goal: Lower Body Dressing - Progress: Progressing toward goals Pt Will Transfer to Toilet: with modified independence;Ambulation;with DME;Comfort height toilet;Maintaining back safety precautions ADL Goal: Toilet Transfer - Progress: Progressing toward goals Pt Will Perform Toileting - Clothing Manipulation: with modified independence;Sitting on 3-in-1 or toilet;Standing ADL Goal: Toileting - Clothing Manipulation -  Progress: Progressing toward goals Pt Will Perform Toileting -  Hygiene: with modified independence;Sit to stand from 3-in-1/toilet ADL Goal: Toileting - Hygiene - Progress: Progressing toward goals Pt Will Perform Tub/Shower Transfer: Tub transfer;with modified independence;Ambulation;with DME;Transfer tub bench;Maintaining back safety precautions Miscellaneous OT Goals Miscellaneous OT Goal #1: Pt will perform bed mobility at mod I level as precursor for EOB ADLs. OT Goal: Miscellaneous Goal #1 - Progress: Progressing toward goals  Visit Information  Last OT Received On: 05/11/13 Assistance Needed: +1    Subjective Data      Prior Functioning       Cognition  Cognition Arousal/Alertness: Awake/alert Behavior During Therapy: WFL for tasks assessed/performed Overall Cognitive Status: Within Functional Limits for tasks assessed    Mobility  Bed Mobility Bed Mobility: Supine to Sit;Sitting - Scoot to Edge of Bed Supine to Sit: 4: Min assist;HOB flat Sitting - Scoot to [place] of Bed: 5: Supervision Details for Bed Mobility Assistance: Pt (A) with side to sit due to Lt UE across body and twisting abdomen. Pt cued to avoid twisting. Pt needed (A) to push to upright position Transfers Sit to Stand: 4: Min assist;With upper extremity assist;From bed Stand to Sit: 4: Min assist;With upper extremity assist;To chair/3-in-1 Details for Transfer Assistance: MIn (A) and mod v/c to avoid pulling on RW> Pt with low bed surface at home and simulated this session. Pt unable to complete transfer without (A). Pt educated on higher the surface easier the transfer    Exercises      Balance     End of Session OT - End of Session Activity Tolerance: Patient tolerated treatment well Patient left: in chair Nurse Communication: Mobility status;Precautions  GO     Harrel Carina [facility] 05/11/2013, 9:00 AM Pager: [phone]

## 2013-05-12 NOTE — Progress Notes (Signed)
Physical Therapy Treatment Patient Details Name: Mary Holt MRN: [id number] DOB: 1961-11-01 Today's Date: 05/12/2013 Time: 8295-6213 PT Time Calculation (min): 42 min  PT Assessment / Plan / Recommendation Comments on Treatment Session  Still moving rather slow 2/2 pain. Reviewed and practice bed mobility today. Patient able to verbally describe technique however speed and efficiency limited by her pain. Reviewed safety precautions and efficiency to improve safety at home (with regards to proximity of RW when gettig in the bed).     Follow Up Recommendations  Home health PT;Supervision - Intermittent     Does the patient have the potential to tolerate intense rehabilitation     Barriers to Discharge        Equipment Recommendations  Rolling walker with 5" wheels    Recommendations for Other Services    Frequency Min 5X/week   Plan Discharge plan remains appropriate;Frequency remains appropriate    Precautions / Restrictions Precautions Precautions: Back;Fall Precaution Comments: able to recall 3/3 precautions Required Braces or Orthoses: Spinal Brace Spinal Brace: Lumbar corset Spinal Brace Comments: Pt said that nursing helped her don brace (just getting it around her back to prevent breaking precautions) Restrictions Weight Bearing Restrictions: No   Pertinent Vitals/Pain Reports 7/10 back pain, reinforced back precautions and assisted pt to reposition    Mobility  Bed Mobility Bed Mobility: Right Sidelying to Sit;Sit to Sidelying Right Rolling Right: 6: Modified independent (Device/Increase time) Right Sidelying to Sit: 4: Min guard;HOB flat Sit to Sidelying Right: 4: Min assist;4: Min guard;HOB flat Details for Bed Mobility Assistance: performed sit<>sidelying right x2 for reinforcement of technique, patient initially needing minA to initiate bring legs up to bed level but able to perform the second time without physical assist; verbal cues for efficiency and pain  control during all bed mobility Transfers Transfers: Sit to Stand;Stand to Sit Sit to Stand: 5: Supervision;From chair/3-in-1;From bed;With upper extremity assist Stand to Sit: 5: Supervision;To chair/3-in-1;To bed;With upper extremity assist Details for Transfer Assistance: cues for safety, slow to rise due to pain and needed 1 hand on RW to provide trunk support (patient educated that she must push down and not attempt to pull on this or it will not provide her with any support), sit<>stand x2; cueing for scooting edge of chair to better position herself for sit->stand Ambulation/Gait Ambulation/Gait Assistance: 5: Supervision Assistive device: Rolling walker Ambulation/Gait Assistance Details: slow ambulation but no major concerns, did cue her to try to pick her feet up during swing to avoid catching her toe on anything at home Gait Pattern: Step-through pattern;Shuffle General Gait Details: pt declined further ambulation or stair training today, denied any concerns Stairs: No      PT Goals Acute Rehab PT Goals PT Goal: Rolling Supine to Right Side - Progress: Met PT Goal: Rolling Supine to Left Side - Progress: Progressing toward goal PT Goal: Supine/Side to Sit - Progress: Progressing toward goal PT Goal: Sit to Supine/Side - Progress: Progressing toward goal PT Goal: Sit to Stand - Progress: Progressing toward goal PT Goal: Ambulate - Progress: Progressing toward goal Additional Goals PT Goal: Additional Goal #1 - Progress: Progressing toward goal  Visit Information  Last PT Received On: 05/12/13 Assistance Needed: +1    Subjective Data  Subjective: Its just so sore.  Patient Stated Goal: home   Cognition  Cognition Arousal/Alertness: Awake/alert Behavior During Therapy: WFL for tasks assessed/performed Overall Cognitive Status: Within Functional Limits for tasks assessed    Balance  Dynamic Standing Balance Dynamic Standing -  Balance Support: Left upper extremity  supported Dynamic Standing - Level of Assistance: 5: Stand by assistance  End of Session PT - End of Session Equipment Utilized During Treatment: Gait belt Activity Tolerance: Patient tolerated treatment well Patient left: in chair;with call bell/phone within reach Nurse Communication: Mobility status   GP     [facility] HELEN 05/12/2013, 11:30 AM

## 2013-05-12 NOTE — Progress Notes (Signed)
Patient ID: Mary Holt, female   DOB: Jul 03, 1961, 52 y.o.   MRN: [id number] BP 133/84  Pulse 110  Temp(Src) 97.9 F (36.6 C) (Oral)  Resp 18  Ht 5\' 3"  (1.6 m)  Wt 78.019 kg (172 lb)  BMI 30.48 kg/m2  SpO2 96%  LMP 04/23/2013 Alert and oriented x 3, moving all extremities Wound is clean dry, and without signs of infection.  Discharge tomorrow.

## 2013-05-12 NOTE — Progress Notes (Signed)
I agree with the following treatment note after reviewing documentation.   Johnston, Farrell Broerman Brynn   OTR/L Pager: [phone] Office: [phone] .   

## 2013-05-12 NOTE — Progress Notes (Signed)
Occupational Therapy Treatment Patient Details Name: Mary Holt MRN: [id number] DOB: 11-28-1961 Today's Date: 05/12/2013 Time: 5621-3086 OT Time Calculation (min): 35 min  OT Assessment / Plan / Recommendation Comments on Treatment Session Pt completed tub transfer, and Pt was educated on maintaining precautions and setting shower up so that they can be successful. OTS recommended that Pt have bathing assistance available at home.    Follow Up Recommendations  Home health OT       Equipment Recommendations  3 in 1 bedside comode;Tub/shower bench       Frequency Min 2X/week   Plan Discharge plan remains appropriate    Precautions / Restrictions Precautions Precautions: Back;Fall Precaution Comments: able to recall 3/3 precautions Required Braces or Orthoses: Spinal Brace Spinal Brace: Other (comment) (Pt was sitting in brace when OTS entered the room) Spinal Brace Comments: Pt said that nursing helped her don brace (just getting it around her back to prevent breaking precautions) Restrictions Weight Bearing Restrictions: No   Pertinent Vitals/Pain Pt rated pain at 7/10 pain.    ADL  Tub/Shower Transfer: Performed;Min guard Tub/Shower Transfer Method: Other (comment) (Tub Bench) Tub/Shower Transfer Equipment: Counsellor Used: Back brace;Gait belt;Rolling walker;Other (comment) (Tub Bench,) Transfers/Ambulation Related to ADLs: Pt ambulated down the hall to gym at a decr pace, but increased from previous session. Pt needed mod cueing/education for hand placement during tub transfer because it was Pt's first attempt ADL Comments: Pt completed tub transfer safely under min guard supervision.  Pt needed min cueing on handplacement for precautions and also was instructed to use a belt or towel under knee to aid in getting leg over tub edge. Pt also educated on tub safety as far as bending to reach shower nozzles.  Pt says she will have help at home the first few  times she takes a shower.      OT Goals Acute Rehab OT Goals OT Goal Formulation: With patient Time For Goal Achievement: 05/16/13 Potential to Achieve Goals: Good ADL Goals Pt Will Perform Grooming: with modified independence;Standing at sink Pt Will Perform Lower Body Bathing: with modified independence;Sit to stand from bed;Sit to stand from chair;with adaptive equipment Pt Will Perform Lower Body Dressing: with modified independence;Sit to stand from chair;Sit to stand from bed;with adaptive equipment Pt Will Transfer to Toilet: with modified independence;Ambulation;with DME;Comfort height toilet;Maintaining back safety precautions Pt Will Perform Toileting - Clothing Manipulation: with modified independence;Sitting on 3-in-1 or toilet;Standing Pt Will Perform Toileting - Hygiene: with modified independence;Sit to stand from 3-in-1/toilet Pt Will Perform Tub/Shower Transfer: Tub transfer;with modified independence;Ambulation;with DME;Transfer tub bench;Maintaining back safety precautions ADL Goal: Tub/Shower Transfer - Progress: Progressing toward goals Miscellaneous OT Goals Miscellaneous OT Goal #1: Pt will perform bed mobility at mod I level as precursor for EOB ADLs.  Visit Information  Last OT Received On: 05/12/13 Assistance Needed: +1          Cognition  Cognition Arousal/Alertness: Awake/alert Behavior During Therapy: WFL for tasks assessed/performed Overall Cognitive Status: Within Functional Limits for tasks assessed    Mobility  Bed Mobility Bed Mobility: Not assessed Transfers Transfers: Sit to Stand;Stand to Sit Sit to Stand: 5: Supervision;From chair/3-in-1;With armrests;With upper extremity assist Stand to Sit: 5: Supervision;With upper extremity assist;With armrests;To chair/3-in-1 Details for Transfer Assistance: Pt performed sit <> stand transfers with min cueing and safe handplacement.  Pt had aid of arm rests from chair. Pt was able to stand<>sit from  tub bench with education on lower surface area. OT  instructed that she could use one hand on her knee and one on RW. Pt was successful pushing up from bench with one hand on RW.          End of Session OT - End of Session Equipment Utilized During Treatment: Gait belt;Back brace;Other (comment) (RW) Activity Tolerance: Patient tolerated treatment well Patient left: in chair;with call bell/phone within reach Nurse Communication: Mobility status;Precautions  GO     Sherryl Manges 05/12/2013, 9:33 AM

## 2013-05-12 NOTE — Progress Notes (Signed)
Pts. NSL lt. Ac unable to flushed, site slightly red and tender to touch , IV d/c Dr. Gerlene Fee notified with order to d/c NSL no need to be restarted.

## 2013-05-13 MED ORDER — OXYCODONE-ACETAMINOPHEN 5-325 MG PO TABS
1.0000 | ORAL_TABLET | Freq: Four times a day (QID) | ORAL | Status: DC | PRN
Start: 2013-05-13 — End: 2013-07-31

## 2013-05-13 MED ORDER — CYCLOBENZAPRINE HCL 10 MG PO TABS: 10.0000 mg | ORAL_TABLET | Freq: Three times a day (TID) | ORAL | Status: DC | PRN

## 2013-05-13 NOTE — Progress Notes (Signed)
Physical Therapy Treatment Patient Details Name: Mary Holt MRN: [id number] DOB: 17-Aug-1961 Today's Date: 05/13/2013 Time: 4098-1191 PT Time Calculation (min): 22 min  PT Assessment / Plan / Recommendation Comments on Treatment Session  Moving slow due to pain however appears more mobile and agile today. Reviewed education for likely d/c home today. Reviewed proper transfer technique. Pt to d/c today and she is modI and supervision at this point. Will defer further PT needs to HHPT.     Follow Up Recommendations  Home health PT;Supervision - Intermittent     Does the patient have the potential to tolerate intense rehabilitation     Barriers to Discharge        Equipment Recommendations  Rolling walker with 5" wheels    Recommendations for Other Services    Frequency     Plan Discharge plan remains appropriate; Pt to d/c home today, has reached supervision/modified independent level demonstrating safe mobility for when she will be home by herself, all education about precautions and safe mobility have been reviewed and patient denies further concerns, will defer further PT to HHPT   Precautions / Restrictions Precautions Precautions: Back;Fall Precaution Comments: able to recall 3/3 precautions Required Braces or Orthoses: Spinal Brace Spinal Brace: Lumbar corset Restrictions Weight Bearing Restrictions: No   Pertinent Vitals/Pain Reports aching in her back unrated, RN made aware and pain meds provided    Mobility  Bed Mobility Bed Mobility: Not assessed Details for Bed Mobility Assistance: Patient able to verablize correct technique and reports she was able to do this overnight without physical assist but declined practicing this am Transfers Transfers: Sit to Stand;Stand to Sit Sit to Stand: 5: Supervision;With upper extremity assist;With armrests;From chair/3-in-1 Stand to Sit: 6: Modified independent (Device/Increase time);With upper extremity assist;With  armrests;To chair/3-in-1 Details for Transfer Assistance: initial cues for safe technique and hand placement but good carry over Ambulation/Gait Ambulation/Gait Assistance: 6: Modified independent (Device/Increase time);5: Supervision Ambulation Distance (Feet): 30 Feet Assistive device: Rolling walker Ambulation/Gait Assistance Details: initial verbal and demonstrative cues for proper positioning within RW Gait Pattern: Step-through pattern;Shuffle General Gait Details: decreased step height, declined practicing stairs      PT Goals Acute Rehab PT Goals PT Goal: Sit to Stand - Progress: Met PT Goal: Ambulate - Progress: Met Additional Goals PT Goal: Additional Goal #1 - Progress: Met  Visit Information  Last PT Received On: 05/13/13 Assistance Needed: +1    Subjective Data  Subjective: Im ready to go home today. I guess I'll just hurt here and at home.  Patient Stated Goal: home   Cognition  Cognition Arousal/Alertness: Awake/alert Behavior During Therapy: WFL for tasks assessed/performed Overall Cognitive Status: Within Functional Limits for tasks assessed    Balance     End of Session PT - End of Session Equipment Utilized During Treatment: Gait belt;Back brace Activity Tolerance: Patient tolerated treatment well Patient left: in chair;with call bell/phone within reach Nurse Communication: Mobility status   GP     [facility] HELEN 05/13/2013, 8:58 AM

## 2013-05-13 NOTE — Discharge Summary (Signed)
Physician Discharge Summary  Patient ID: Mary Holt MRN: [id number] DOB/AGE: 1961/08/12 52 y.o.  Admit date: 05/08/2013 Discharge date: 05/13/2013  Admission Diagnoses:Spondylolisthesis L4-5, lumbar stenosis L4-5   Discharge Diagnoses: Spondylolisthesis L4-5, lumbar stenosis L4-5  Active Problems:   * No active hospital problems. *   Discharged Condition: good  Hospital Course: [name] was admitted to the hospital and taken to the operating room for an un complicated lumbar decompression and fusion. Post op she had the expected amount of pain in the lumbar region. She is ambulating, tolerating a regular diet, and voiding without difficulty. Her wound is clean, dry, and without signs of infection at discharge.   Consults: None  Significant Diagnostic Studies: none  Treatments: surgery: POSTERIOR LUMBAR iNTERBODY FUSION 1 LEVEL L4-5 Lumbar decompression beyond what is necessary for a PLIF L4-5, decompression of the L4 and L5 nerve rootS, Pedicle screw fixation. Nuvasive cages and hardware    Discharge Exam: Blood pressure 149/78, pulse 103, temperature 98.4 F (36.9 C), temperature source Oral, resp. rate 19, height 5\' 3"  (1.6 m), weight 78.019 kg (172 lb), last menstrual period 04/23/2013, SpO2 100.00%. General appearance: alert, cooperative, appears stated age and mild distress Neurologic: Alert and oriented X 3, normal strength and tone. Normal symmetric reflexes. Normal coordination and gait  Disposition: 06-Home-Health Care Svc     Medication List    TAKE these medications       cyclobenzaprine 10 MG tablet  Commonly known as:  FLEXERIL  Take 1 tablet (10 mg total) by mouth 3 (three) times daily as needed for muscle spasms.     metoprolol succinate 25 MG 24 hr tablet  Commonly known as:  TOPROL-XL  Take 25 mg by mouth daily.     oxyCODONE-acetaminophen 5-325 MG per tablet  Commonly known as:  ROXICET  Take 1 tablet by mouth every 6 (six) hours as needed  for pain.           Follow-up Information   Follow up with Britney Captain L, MD In 4 weeks. (call office to make appointment)    Contact information:   [address]                         [address] [place] [address] [phone]       Signed: Corderro Koloski L 05/13/2013, 10:39 AM

## 2013-07-31 ENCOUNTER — Encounter (HOSPITAL_COMMUNITY): Payer: Self-pay

## 2013-08-04 ENCOUNTER — Encounter (HOSPITAL_COMMUNITY): Payer: Self-pay

## 2013-08-04 ENCOUNTER — Encounter (HOSPITAL_COMMUNITY)
Admission: RE | Admit: 2013-08-04 | Discharge: 2013-08-04 | Disposition: A | Discharge status: Home / Self Care | Payer: 59 | Source: Ambulatory Visit | Attending: Obstetrics and Gynecology | Admitting: Obstetrics and Gynecology

## 2013-08-04 ENCOUNTER — Other Ambulatory Visit: Payer: Self-pay | Admitting: Obstetrics and Gynecology

## 2013-08-04 DIAGNOSIS — Z01818 Encounter for other preprocedural examination: Secondary | ICD-10-CM | POA: Insufficient documentation

## 2013-08-04 DIAGNOSIS — Z01812 Encounter for preprocedural laboratory examination: Secondary | ICD-10-CM | POA: Insufficient documentation

## 2013-08-04 HISTORY — DX: Nonrheumatic mitral (valve) prolapse: I34.1

## 2013-08-04 LAB — BASIC METABOLIC PANEL
Chloride: 105 mEq/L (ref 96–112)
GFR calc Af Amer: >90 mL/min (ref >90)
GFR calc non Af Amer: >90 mL/min (ref >90)
Glucose, Bld: 107 mg/dL — ABNORMAL HIGH (ref 70–99)
Potassium: 4 mEq/L (ref 3.5–5.1)
Sodium: 138 mEq/L (ref 135–145)

## 2013-08-04 LAB — CBC
Hemoglobin: 11 g/dL — ABNORMAL LOW (ref 12.0–15.0)
MCHC: 31.4 g/dL (ref 30.0–36.0)
WBC: 6.6 10*3/uL (ref 4.0–10.5)

## 2013-08-04 NOTE — Patient Instructions (Addendum)
Your procedure is scheduled on:08/11/13  Enter through the Main Entrance at :0945am Pick up desk phone and dial [phone] and inform us of your arrival.  Please call [phone] if you have any problems the morning of surgery.  Remember: Do not eat food or after midnight:Monday Clear liquids are ok until:0700 am Tuesday   You may brush your teeth the morning of surgery.  Take these meds the morning of surgery with a sip of water:Toprol  DO NOT wear jewelry, eye make-up, lipstick,body lotion, or dark fingernail polish.  (Polished toes are ok) You may wear deodorant.  If you are to be admitted after surgery, leave suitcase in car until your room has been assigned. Patients discharged on the day of surgery will not be allowed to drive home. Wear loose fitting, comfortable clothes for your ride home.

## 2013-08-10 MED ORDER — FENTANYL CITRATE 0.05 MG/ML IJ SOLN
INTRAMUSCULAR | Status: AC
  Filled 2013-08-10: qty 2

## 2013-08-10 MED ORDER — OXYTOCIN 10 UNIT/ML IJ SOLN
INTRAMUSCULAR | Status: AC
  Filled 2013-08-10: qty 2

## 2013-08-10 MED ORDER — MORPHINE SULFATE 0.5 MG/ML IJ SOLN
INTRAMUSCULAR | Status: AC
  Filled 2013-08-10: qty 10

## 2013-08-10 MED ORDER — ONDANSETRON HCL 4 MG/2ML IJ SOLN
INTRAMUSCULAR | Status: AC
  Filled 2013-08-10: qty 2

## 2013-08-11 ENCOUNTER — Encounter (HOSPITAL_COMMUNITY): Payer: Self-pay | Admitting: Anesthesiology

## 2013-08-11 ENCOUNTER — Ambulatory Visit (HOSPITAL_COMMUNITY)
Admission: RE | Admit: 2013-08-11 | Discharge: 2013-08-11 | Disposition: A | Discharge status: Home / Self Care | Payer: 59 | Source: Ambulatory Visit | Attending: Obstetrics and Gynecology | Admitting: Obstetrics and Gynecology

## 2013-08-11 ENCOUNTER — Ambulatory Visit (HOSPITAL_COMMUNITY): Payer: 59 | Admitting: Anesthesiology

## 2013-08-11 ENCOUNTER — Encounter (HOSPITAL_COMMUNITY): Payer: Self-pay

## 2013-08-11 ENCOUNTER — Encounter (HOSPITAL_COMMUNITY)
Admission: RE | Disposition: A | Discharge status: Home / Self Care | Payer: Self-pay | Source: Ambulatory Visit | Attending: Obstetrics and Gynecology

## 2013-08-11 DIAGNOSIS — N949 Unspecified condition associated with female genital organs and menstrual cycle: Secondary | ICD-10-CM | POA: Insufficient documentation

## 2013-08-11 DIAGNOSIS — N840 Polyp of corpus uteri: Secondary | ICD-10-CM | POA: Insufficient documentation

## 2013-08-11 DIAGNOSIS — N924 Excessive bleeding in the premenopausal period: Secondary | ICD-10-CM

## 2013-08-11 DIAGNOSIS — N925 Other specified irregular menstruation: Secondary | ICD-10-CM | POA: Insufficient documentation

## 2013-08-11 DIAGNOSIS — N938 Other specified abnormal uterine and vaginal bleeding: Secondary | ICD-10-CM | POA: Insufficient documentation

## 2013-08-11 HISTORY — PX: DILATATION & CURRETTAGE/HYSTEROSCOPY WITH RESECTOCOPE: SHX5572

## 2013-08-11 SURGERY — DILATATION & CURETTAGE/HYSTEROSCOPY WITH RESECTOCOPE
Anesthesia: General | Site: Vagina | Wound class: Clean Contaminated

## 2013-08-11 MED ORDER — MIDAZOLAM HCL 2 MG/2ML IJ SOLN: 0.5000 mg | Freq: Once | INTRAMUSCULAR | Status: DC | PRN

## 2013-08-11 MED ORDER — MEPERIDINE HCL 25 MG/ML IJ SOLN: 6.2500 mg | INTRAMUSCULAR | Status: DC | PRN

## 2013-08-11 MED ORDER — CHLOROPROCAINE HCL 1 % IJ SOLN
INTRAMUSCULAR | Status: AC
  Filled 2013-08-11: qty 30

## 2013-08-11 MED ORDER — DEXAMETHASONE SODIUM PHOSPHATE 10 MG/ML IJ SOLN
INTRAMUSCULAR | Status: AC
  Filled 2013-08-11: qty 1

## 2013-08-11 MED ORDER — LIDOCAINE HCL (CARDIAC) 20 MG/ML IV SOLN
INTRAVENOUS | Status: DC | PRN
  Administered 2013-08-11: 50 mg via INTRAVENOUS

## 2013-08-11 MED ORDER — PROPOFOL 10 MG/ML IV EMUL
INTRAVENOUS | Status: AC
  Filled 2013-08-11: qty 20

## 2013-08-11 MED ORDER — ONDANSETRON HCL 4 MG/2ML IJ SOLN
INTRAMUSCULAR | Status: DC | PRN
  Administered 2013-08-11: 4 mg via INTRAVENOUS

## 2013-08-11 MED ORDER — CHLOROPROCAINE HCL 1 % IJ SOLN
INTRAMUSCULAR | Status: DC | PRN
  Administered 2013-08-11: 20 mL

## 2013-08-11 MED ORDER — ONDANSETRON HCL 4 MG/2ML IJ SOLN
INTRAMUSCULAR | Status: AC
  Filled 2013-08-11: qty 2

## 2013-08-11 MED ORDER — PHENYLEPHRINE HCL 10 MG/ML IJ SOLN
INTRAMUSCULAR | Status: DC | PRN
  Administered 2013-08-11: 40 ug via INTRAVENOUS
  Administered 2013-08-11 (×2): 80 ug via INTRAVENOUS

## 2013-08-11 MED ORDER — FENTANYL CITRATE 0.05 MG/ML IJ SOLN
INTRAMUSCULAR | Status: DC | PRN
  Administered 2013-08-11: 50 ug via INTRAVENOUS
  Administered 2013-08-11: 100 ug via INTRAVENOUS

## 2013-08-11 MED ORDER — LIDOCAINE HCL (CARDIAC) 20 MG/ML IV SOLN
INTRAVENOUS | Status: AC
  Filled 2013-08-11: qty 5

## 2013-08-11 MED ORDER — PROMETHAZINE HCL 25 MG/ML IJ SOLN: 6.2500 mg | INTRAMUSCULAR | Status: DC | PRN

## 2013-08-11 MED ORDER — MIDAZOLAM HCL 5 MG/5ML IJ SOLN
INTRAMUSCULAR | Status: DC | PRN
  Administered 2013-08-11: 2 mg via INTRAVENOUS

## 2013-08-11 MED ORDER — PHENYLEPHRINE 40 MCG/ML (10ML) SYRINGE FOR IV PUSH (FOR BLOOD PRESSURE SUPPORT)
PREFILLED_SYRINGE | INTRAVENOUS | Status: AC
  Filled 2013-08-11: qty 5

## 2013-08-11 MED ORDER — FENTANYL CITRATE 0.05 MG/ML IJ SOLN
INTRAMUSCULAR | Status: AC
  Filled 2013-08-11: qty 5

## 2013-08-11 MED ORDER — LACTATED RINGERS IV SOLN
INTRAVENOUS | Status: DC
  Administered 2013-08-11 (×2): via INTRAVENOUS

## 2013-08-11 MED ORDER — FENTANYL CITRATE 0.05 MG/ML IJ SOLN: 25.0000 ug | INTRAMUSCULAR | Status: DC | PRN

## 2013-08-11 MED ORDER — DEXAMETHASONE SODIUM PHOSPHATE 10 MG/ML IJ SOLN
INTRAMUSCULAR | Status: DC | PRN
  Administered 2013-08-11: 10 mg via INTRAVENOUS

## 2013-08-11 MED ORDER — PROPOFOL 10 MG/ML IV BOLUS
INTRAVENOUS | Status: DC | PRN
  Administered 2013-08-11: 180 mg via INTRAVENOUS

## 2013-08-11 MED ORDER — MIDAZOLAM HCL 2 MG/2ML IJ SOLN
INTRAMUSCULAR | Status: AC
  Filled 2013-08-11: qty 2

## 2013-08-11 MED ORDER — GLYCINE 1.5 % IR SOLN
Status: DC | PRN
  Administered 2013-08-11: 3000 mL

## 2013-08-11 SURGICAL SUPPLY — 22 items
CANISTER SUCTION 2500CC (MISCELLANEOUS) ×2 IMPLANT
CATH ROBINSON RED A/P 16FR (CATHETERS) ×2 IMPLANT
CLOTH BEACON ORANGE TIMEOUT ST (SAFETY) ×2 IMPLANT
CONTAINER PREFILL 10% NBF 60ML (FORM) ×4 IMPLANT
CORD ACTIVE DISPOSABLE (ELECTRODE) ×1
CORD ELECTRO ACTIVE DISP (ELECTRODE) ×1 IMPLANT
DRESSING TELFA 8X3 (GAUZE/BANDAGES/DRESSINGS) ×2 IMPLANT
ELECT LOOP GYNE PRO 24FR (CUTTING LOOP) ×2
ELECT REM PT RETURN 9FT ADLT (ELECTROSURGICAL) ×2
ELECTRODE LOOP GYNE PRO 24FR (CUTTING LOOP) ×1 IMPLANT
ELECTRODE REM PT RTRN 9FT ADLT (ELECTROSURGICAL) ×1 IMPLANT
ELECTRODE ROLLER VERSAPOINT (ELECTRODE) IMPLANT
ELECTRODE RT ANGLE VERSAPOINT (CUTTING LOOP) IMPLANT
GLOVE BIO SURGEON STRL SZ 6.5 (GLOVE) ×2 IMPLANT
GLOVE BIOGEL PI IND STRL 7.0 (GLOVE) ×1 IMPLANT
GLOVE BIOGEL PI INDICATOR 7.0 (GLOVE) ×1
GOWN STRL REIN XL XLG (GOWN DISPOSABLE) ×4 IMPLANT
LOOP ANGLED CUTTING 22FR (CUTTING LOOP) ×2 IMPLANT
PACK HYSTEROSCOPY LF (CUSTOM PROCEDURE TRAY) ×2 IMPLANT
PAD OB MATERNITY 4.3X12.25 (PERSONAL CARE ITEMS) ×2 IMPLANT
TOWEL OR 17X24 6PK STRL BLUE (TOWEL DISPOSABLE) ×4 IMPLANT
WATER STERILE IRR 1000ML POUR (IV SOLUTION) ×2 IMPLANT

## 2013-08-11 NOTE — Brief Op Note (Signed)
08/11/2013  12:30 PM  PATIENT:  Mary Holt  52 y.o. female  PRE-OPERATIVE DIAGNOSIS:  Abnormal Peri-Menopausal Bleeding, Endometrial Mass  [phone]  POST-OPERATIVE DIAGNOSIS:  Abnormal Perimenopausal bleeding,  Endometrial polyps  PROCEDURE:  Diagnostic hysteroscopy, resection of endometrial polyps, dilation and currettage  SURGEON:  Surgeon(s) and Role:    * Quinlan Mcfall A Harlean Regula, MD - Primary  PHYSICIAN ASSISTANT:   ASSISTANTS: none   ANESTHESIA:   general and paracervical block  Findings: multiple polypoid lesions, endometrial thickening EBL:  Total I/O In: 1300 [I.V.:1300] Out: 160 [Urine:150; Blood:10]  BLOOD ADMINISTERED:none  DRAINS: none   LOCAL MEDICATIONS USED:  OTHER nesicaine  SPECIMEN:  Source of Specimen:  emc with polyps  DISPOSITION OF SPECIMEN:  PATHOLOGY  COUNTS:  YES  TOURNIQUET:  * No tourniquets in log *  DICTATION: .Other Dictation: Dictation Number [id number]  PLAN OF CARE: Discharge to home after PACU  PATIENT DISPOSITION:  PACU - hemodynamically stable.   Delay start of Pharmacological VTE agent (>24hrs) due to surgical blood loss or risk of bleeding: no

## 2013-08-11 NOTE — Anesthesia Postprocedure Evaluation (Signed)
  Anesthesia Post Note  Patient: Mary Holt  Procedure(s) Performed: Procedure(s) (LRB): DILATATION & CURETTAGE/HYSTEROSCOPY WITH RESECTOCOPE (N/A)  Anesthesia type: GA  Patient location: PACU  Post pain: Pain level controlled  Post assessment: Post-op Vital signs reviewed  Last Vitals:  Filed Vitals:   08/11/13 1245  BP: 138/81  Pulse: 86  Temp:   Resp: 12    Post vital signs: Reviewed  Level of consciousness: sedated  Complications: No apparent anesthesia complications

## 2013-08-11 NOTE — Anesthesia Preprocedure Evaluation (Signed)
Anesthesia Evaluation  Patient identified by MRN, date of birth, ID band Patient awake    Reviewed: Allergy & Precautions, H&P , Patient's Chart, lab work & pertinent test results, reviewed documented beta blocker date and time   History of Anesthesia Complications Negative for: history of anesthetic complications  Airway Mallampati: II TM Distance: >3 FB Neck ROM: full    Dental no notable dental hx.    Pulmonary neg pulmonary ROS,  breath sounds clear to auscultation  Pulmonary exam normal       Cardiovascular Exercise Tolerance: Good hypertension, negative cardio ROS  Rhythm:regular Rate:Normal     Neuro/Psych negative neurological ROS  negative psych ROS   GI/Hepatic negative GI ROS, Neg liver ROS,   Endo/Other  negative endocrine ROS  Renal/GU Renal diseasenegative Renal ROS     Musculoskeletal   Abdominal   Peds  Hematology negative hematology ROS (+)   Anesthesia Other Findings Kidney stones     Hypertension        Mitral valve prolapse     Kidney stones   several times, last  one 2014    Heart murmur   asymptomatic MVP (mitral valve prolapse)    Reproductive/Obstetrics negative OB ROS                           Anesthesia Physical Anesthesia Plan  ASA: II  Anesthesia Plan: General LMA   Post-op Pain Management:    Induction:   Airway Management Planned:   Additional Equipment:   Intra-op Plan:   Post-operative Plan:   Informed Consent: I have reviewed the patients History and Physical, chart, labs and discussed the procedure including the risks, benefits and alternatives for the proposed anesthesia with the patient or authorized representative who has indicated his/her understanding and acceptance.   Dental Advisory Given  Plan Discussed with: CRNA, Surgeon and Anesthesiologist  Anesthesia Plan Comments:         Anesthesia Quick Evaluation

## 2013-08-11 NOTE — Transfer of Care (Signed)
Immediate Anesthesia Transfer of Care Note  Patient: Mary Holt  Procedure(s) Performed: Procedure(s): DILATATION & CURETTAGE/HYSTEROSCOPY WITH RESECTOCOPE (N/A)  Patient Location: PACU  Anesthesia Type:General  Level of Consciousness: awake  Airway & Oxygen Therapy: Patient Spontanous Breathing  Post-op Assessment: Report given to PACU RN  Post vital signs: stable  Filed Vitals:   08/11/13 0957  BP: 146/86  Pulse: 69  Temp: 36.7 C  Resp: 16    Complications: No apparent anesthesia complications

## 2013-08-12 ENCOUNTER — Encounter (HOSPITAL_COMMUNITY): Payer: Self-pay | Admitting: Obstetrics and Gynecology

## 2013-08-12 NOTE — Op Note (Signed)
NAME:  Mary Holt, Mary Holt               ACCOUNT NO.:  [id number]  MEDICAL RECORD NO.:  [id number]  LOCATION:  [facility]                          FACILITY:  [facility]  PHYSICIAN:  Maxie Better, M.D.DATE OF BIRTH:  04-May-1961  DATE OF PROCEDURE:  08/11/2013 DATE OF DISCHARGE:  08/11/2013                              OPERATIVE REPORT   PREOPERATIVE DIAGNOSES:  Abnormal perimenopausal bleeding, endometrial mass.  PROCEDURE:  Diagnostic hysteroscopy, hysteroscopic resection of endometrial masses, dilation and curettage.  POSTOPERATIVE DIAGNOSES:  Abnormal perimenopausal bleeding, endometrial mass.  ANESTHESIA:  General, paracervical block.  SURGEON:  Maxie Better, M.D.  ASSISTANT:  None.  PROCEDURE:  Under adequate general anesthesia, the patient was placed in the dorsal lithotomy position.  She was sterilely prepped and draped in usual fashion.  The bladder catheterized for large amount of urine. Examination under anesthesia revealed an enlarged anteverted uterus.  No adnexal masses could be appreciated.  Bivalve speculum was placed in the vagina.  20 mL of 1% Nesacaine was injected paracervically at 3 and 9 o'clock positions.  The anterior lip of the cervix was grasped with single-tooth tenaculum.  The cervix was then serially dilated up to #31 [facility] dilator.  Glycine-primed resectoscope with a single loop was inserted into the uterine cavity.  Both tubal ostias could be seen. Polypoid lesions and thickened endometrium was noted particularly in the posterior wall, all of which was resected and when the resections were done and no other lesions noted, the cavity was then gently curetted. No lesions in the endocervical canal was seen and the procedure at that point was felt to be complete.  All instruments were then removed from the vagina.  Specimen labeled endometrial curetting with polyps sent to Pathology.  Estimated blood loss was 30 mL.  Fluid deficit 125 mL. Complication was  none.  The patient tolerated procedure well, was transferred to the recovery room in stable condition.     Maxie Better, M.D.     [place]/MEDQ  D:  08/11/2013  T:  08/12/2013  Job:  [id number]

## 2014-10-02 ENCOUNTER — Ambulatory Visit (INDEPENDENT_AMBULATORY_CARE_PROVIDER_SITE_OTHER): Payer: 59

## 2014-10-02 ENCOUNTER — Ambulatory Visit (INDEPENDENT_AMBULATORY_CARE_PROVIDER_SITE_OTHER): Payer: 59 | Admitting: Internal Medicine

## 2014-10-02 VITALS — BP 144/90 | HR 74 | Temp 98.4°F | Resp 16 | Ht 63.0 in | Wt 183.8 lb

## 2014-10-02 DIAGNOSIS — R059 Cough, unspecified: Secondary | ICD-10-CM

## 2014-10-02 DIAGNOSIS — R05 Cough: Secondary | ICD-10-CM

## 2014-10-02 DIAGNOSIS — J988 Other specified respiratory disorders: Secondary | ICD-10-CM

## 2014-10-02 DIAGNOSIS — I10 Essential (primary) hypertension: Secondary | ICD-10-CM

## 2014-10-02 DIAGNOSIS — J22 Unspecified acute lower respiratory infection: Secondary | ICD-10-CM

## 2014-10-02 DIAGNOSIS — Z6832 Body mass index (BMI) 32.0-32.9, adult: Secondary | ICD-10-CM

## 2014-10-02 DIAGNOSIS — I119 Hypertensive heart disease without heart failure: Secondary | ICD-10-CM | POA: Insufficient documentation

## 2014-10-02 DIAGNOSIS — Z6831 Body mass index (BMI) 31.0-31.9, adult: Secondary | ICD-10-CM | POA: Insufficient documentation

## 2014-10-02 MED ORDER — AZITHROMYCIN 250 MG PO TABS: ORAL_TABLET | ORAL | Status: DC

## 2014-10-02 MED ORDER — BENZONATATE 100 MG PO CAPS: 100.0000 mg | ORAL_CAPSULE | Freq: Two times a day (BID) | ORAL | Status: DC | PRN

## 2014-10-02 NOTE — Progress Notes (Signed)
Subjective:    Patient ID: Mary Holt, female    DOB: Apr 24, 1961, 53 y.o.   MRN: [id number]  Cough Pertinent negatives include no fever or shortness of breath.   Chief Complaint  Patient presents with  . Cough    x 1 week   This chart was scribed for Mary Lin, MD by Thea Alken, ED Scribe. This patient was seen in room 13 and the patient's care was started at 9:11 AM. 1st [facility] visit  HPI Comments: Mary Holt is a 53 y.o. female who presents to the [facility] complaining of a productive, nagging cough onset 1 week. Pt reports cough interfere with sleep with mild diaphoresis. Pt denies fever, fatigue, SOB. She denies hx asthma. Pt denies smoking. Pt reports hand surgery next week(CTS#2-Dr [facility]).    Past Medical History  Diagnosis Date  . Kidney stones   . Hypertension----stable on meds   . Mitral valve prolapse   . Kidney stones     several times, last  one 2014  . Heart murmur     asymptomatic  . MVP (mitral valve prolapse)    Past Surgical History  Procedure Laterality Date  . Back surgery    . Trigger finger release      left thumb  . Tonsillectomy    . Breast surgery      cyst right breast  . Tubal ligation    . Dilatation & currettage/hysteroscopy with resectocope N/A 08/11/2013    Procedure: DILATATION & CURETTAGE/HYSTEROSCOPY WITH RESECTOCOPE;  Surgeon: Marvene Staff, MD;  Location: [facility];  Service: Gynecology;  Laterality: N/A;   Prior to Admission medications   Medication Sig Start Date End Date Taking? Authorizing Provider  ibuprofen (ADVIL,MOTRIN) 600 MG tablet Take 600 mg by mouth daily as needed for pain (takes a couple times per week).   Yes Historical Provider, MD  metoprolol succinate (TOPROL-XL) 25 MG 24 hr tablet Take 25 mg by mouth daily.   Yes Historical Provider, MD    Review of Systems  Constitutional: Positive for diaphoresis. Negative for fever and fatigue.  Respiratory: Negative for shortness of  breath.   No Reflux  Objective:   Physical Exam  Nursing note and vitals reviewed. Constitutional: She is oriented to person, place, and time. She appears well-developed and well-nourished. No distress.  HENT:  Head: Normocephalic and atraumatic.  Right Ear: External ear normal.  Left Ear: External ear normal.  Mouth/Throat: Oropharynx is clear and moist.  Eyes: Conjunctivae and EOM are normal.  Neck: Neck supple.  Cardiovascular: Normal rate, regular rhythm and normal heart sounds.   No murmur heard. Pulmonary/Chest: Effort normal. She has decreased breath sounds. She has rales.  Decreased breath sound at right base with rales and rhonchi on the right posteriorly  Musculoskeletal: Normal range of motion.  Neurological: She is alert and oriented to person, place, and time.  Skin: Skin is warm and dry.  Psychiatric: She has a normal mood and affect. Her behavior is normal.   [facility] reading (PRIMARY) by Dr. Laney Pastor. No consolidation or infiltrate.   Assessment & Plan:   I have completed the patient encounter in its entirety as documented by the scribe, with editing by me where necessary. Sarahelizabeth Conway P. Laney Pastor, M.D. Cough - Plan: DG Chest 2 View  Lower resp. tract infection  Essential hypertension  BMI 32.0-32.9,adult  Meds ordered this encounter  Medications  . azithromycin (ZITHROMAX) 250 MG tablet    Sig: As packaged  Dispense:  6 tablet    Refill:  0  . benzonatate (TESSALON) 100 MG capsule    Sig: Take 1 capsule (100 mg total) by mouth 2 (two) times daily as needed for cough.    Dispense:  20 capsule    Refill:  0

## 2014-10-05 ENCOUNTER — Telehealth: Payer: Self-pay

## 2014-10-05 NOTE — Telephone Encounter (Signed)
Pt states seen by dr Laney Pastor last weekend and medications prescribed not helping, she still has a cough. Pt is having surgery on Friday and wants to know if there is something else he can prescribe that will help clear this up by her surgery Please call pt to advise

## 2014-10-05 NOTE — Telephone Encounter (Signed)
Pt says the cough is not getting better. The tessalon pearls are not providing relief. Cough is making it hard for her to fall asleep; once she is asleep she is okay. Pt is concerned because she is having hand surgery on Friday.  Please advise.

## 2014-10-06 MED ORDER — PHENYLEPH-PROMETHAZINE-COD 5-6.25-10 MG/5ML PO SYRP: 5.0000 mL | ORAL_SOLUTION | Freq: Four times a day (QID) | ORAL | Status: AC | PRN

## 2014-10-06 NOTE — Telephone Encounter (Signed)
Pt advised.

## 2014-10-06 NOTE — Telephone Encounter (Signed)
Rx printed.  Meds ordered this encounter  Medications  . Phenyleph-Promethazine-Cod 5-6.25-10 MG/5ML SYRP    Sig: Take 5 mLs by mouth every 6 (six) hours as needed (cough).    Dispense:  120 mL    Refill:  0    Order Specific Question:  Supervising Provider    Answer:  DOOLITTLE, ROBERT P [[id number]]

## 2014-10-18 ENCOUNTER — Other Ambulatory Visit: Payer: Self-pay

## 2014-10-18 DIAGNOSIS — Z1231 Encounter for screening mammogram for malignant neoplasm of breast: Secondary | ICD-10-CM

## 2014-11-05 ENCOUNTER — Ambulatory Visit
Admission: RE | Admit: 2014-11-05 | Discharge: 2014-11-05 | Disposition: A | Discharge status: Home / Self Care | Payer: 59 | Source: Ambulatory Visit

## 2014-11-05 DIAGNOSIS — Z1231 Encounter for screening mammogram for malignant neoplasm of breast: Secondary | ICD-10-CM

## 2014-11-09 ENCOUNTER — Other Ambulatory Visit: Payer: Self-pay | Admitting: Internal Medicine

## 2014-11-09 DIAGNOSIS — R928 Other abnormal and inconclusive findings on diagnostic imaging of breast: Secondary | ICD-10-CM

## 2014-11-15 ENCOUNTER — Ambulatory Visit
Admission: RE | Admit: 2014-11-15 | Discharge: 2014-11-15 | Disposition: A | Discharge status: Home / Self Care | Payer: 59 | Source: Ambulatory Visit | Attending: Internal Medicine | Admitting: Internal Medicine

## 2014-11-15 ENCOUNTER — Other Ambulatory Visit: Payer: Self-pay | Admitting: Internal Medicine

## 2014-11-15 DIAGNOSIS — R928 Other abnormal and inconclusive findings on diagnostic imaging of breast: Secondary | ICD-10-CM

## 2015-05-04 ENCOUNTER — Other Ambulatory Visit: Payer: Self-pay | Admitting: Internal Medicine

## 2015-05-04 DIAGNOSIS — N6310 Unspecified lump in the right breast, unspecified quadrant: Secondary | ICD-10-CM

## 2015-05-10 ENCOUNTER — Ambulatory Visit
Admission: RE | Admit: 2015-05-10 | Discharge: 2015-05-10 | Disposition: A | Discharge status: Home / Self Care | Payer: 59 | Source: Ambulatory Visit | Attending: Internal Medicine | Admitting: Internal Medicine

## 2015-05-10 DIAGNOSIS — N6310 Unspecified lump in the right breast, unspecified quadrant: Secondary | ICD-10-CM

## 2016-04-30 ENCOUNTER — Ambulatory Visit (HOSPITAL_COMMUNITY)
Admission: EM | Admit: 2016-04-30 | Discharge: 2016-04-30 | Disposition: A | Discharge status: Home / Self Care | Payer: 59 | Attending: Family Medicine | Admitting: Family Medicine

## 2016-04-30 ENCOUNTER — Encounter (HOSPITAL_COMMUNITY): Payer: Self-pay | Admitting: *Deleted

## 2016-04-30 DIAGNOSIS — I10 Essential (primary) hypertension: Secondary | ICD-10-CM

## 2016-04-30 DIAGNOSIS — R0789 Other chest pain: Secondary | ICD-10-CM | POA: Diagnosis not present

## 2016-04-30 DIAGNOSIS — Z6832 Body mass index (BMI) 32.0-32.9, adult: Secondary | ICD-10-CM

## 2016-04-30 NOTE — Discharge Instructions (Signed)
Advise to follow up with primary care provider at [facility] for blood pressure and atypical chest pain. Spoke to Cardiology and they recommend work up through primary care provider for chest pain that is not suspected to be cardiac in etiology.  Would recommend getting in with PCP this week.  Chest Wall Pain Chest wall pain is pain in or around the bones and muscles of your chest. Sometimes, an injury causes this pain. Sometimes, the cause may not be known. This pain may take several weeks or longer to get better. HOME CARE INSTRUCTIONS  Pay attention to any changes in your symptoms. Take these actions to help with your pain:   Rest as told by your health care provider.   Avoid activities that cause pain. These include any activities that use your chest muscles or your abdominal and side muscles to lift heavy items.   If directed, apply ice to the painful area:  Put ice in a plastic bag.  Place a towel between your skin and the bag.  Leave the ice on for 20 minutes, 2-3 times per day.  Take over-the-counter and prescription medicines only as told by your health care provider.  Do not use tobacco products, including cigarettes, chewing tobacco, and e-cigarettes. If you need help quitting, ask your health care provider.  Keep all follow-up visits as told by your health care provider. This is important. SEEK MEDICAL CARE IF:  You have a fever.  Your chest pain becomes worse.  You have new symptoms. SEEK IMMEDIATE MEDICAL CARE IF:  You have nausea or vomiting.  You feel sweaty or light-headed.  You have a cough with phlegm (sputum) or you cough up blood.  You develop shortness of breath.   This information is not intended to replace advice given to you by your health care provider. Make sure you discuss any questions you have with your health care provider.   Document Released: 12/10/2005 Document Revised: 08/31/2015 Document Reviewed: 03/07/2015 Elsevier Interactive Patient  Education [employer].  Electrocardiography Electrocardiography is a test that records the electrical impulses of the heart. It assesses many aspects of heart health, including:  Heart function.  Heart rhythm.  Heart muscle thickness. Electrocardiography can be done as a routine part of a physical exam. It can also be done to evaluate symptoms such as severe chest pain and heart palpitations. PROCEDURE   Electrocardiography is simple, safe, and painless. No electricity goes through your body during the procedure.  You will be asked to remove your clothes from the waist up and lie on your back for the test.  Sticky patches (electrodes) will be placed on your chest, arms, and legs. The electrodes will be attached by wires to the electrocardiography machine.  You will be asked to relax and lie still for a few seconds while the electrocardiography machine records the electrical activity of your heart. AFTER THE PROCEDURE  If electrocardiography is part of a routine physical exam, you may return to normal activities as told by your health care provider.  Your health care provider or a heart doctor (cardiologist) will interpret the recording.  The test result may not be available during your visit. If your test result is not back during the visit, make an appointment with your health care provider to find out the result. It is your responsibility to get your test results.   This information is not intended to replace advice given to you by your health care provider. Make sure you discuss any questions  you have with your health care provider.   Document Released: 12/07/2000 Document Revised: 12/31/2014 Document Reviewed: 04/21/2012 Elsevier Interactive Patient Education [employer].

## 2016-04-30 NOTE — ED Notes (Signed)
Pt  Reports   Symptoms  Of    Back  Pain   With  Epigastric   Pain  As   Well       Pt  Reports      Symptoms  X  1  Week        [t  Has  An  appt  In  3  Days  With  Her  Spine  Dr   But  She  Felt  Like     She could  Not  Wait  That  Long

## 2016-04-30 NOTE — ED Provider Notes (Signed)
CSN: [id number]     Arrival date & time 04/30/16  1331 History   None    Chief Complaint  Patient presents with  . Chest Pain   (Consider location/radiation/quality/duration/timing/severity/associated sxs/prior Treatment) HPI Comments: 55 y/o AA female c/o intermittant poking pressure in epigastric area when at rest and when she moves.  She denies diaphoresis and nausea.  She has family hx of CAD a father who died in his 80's of MI.  She has hx of back problems and is going to see her back doctor next week.  She denies any sx's at present.  Patient is a 55 y.o. female presenting with chest pain. The history is provided by the patient.  Chest Pain Pain location:  Epigastric Pain quality: dull and stabbing   Pain radiates to:  Does not radiate Pain radiates to the back: no   Pain severity:  Mild Onset quality:  Sudden Duration:  1 week Timing:  Intermittent Progression:  Unchanged Chronicity:  Recurrent Context: movement and at rest   Worsened by:  Nothing tried Ineffective treatments:  None tried Risk factors: hypertension     Past Medical History  Diagnosis Date  . Kidney stones   . Hypertension   . Mitral valve prolapse   . Kidney stones     several times, last  one 2014  . Heart murmur     asymptomatic  . MVP (mitral valve prolapse)    Past Surgical History  Procedure Laterality Date  . Back surgery    . Trigger finger release      left thumb  . Tonsillectomy    . Breast surgery      cyst right breast  . Tubal ligation    . Dilatation & currettage/hysteroscopy with resectocope N/A 08/11/2013    Procedure: DILATATION & CURETTAGE/HYSTEROSCOPY WITH RESECTOCOPE;  Surgeon: Marvene Staff, MD;  Location: [facility];  Service: Gynecology;  Laterality: N/A;   Family History  Problem Relation Age of Onset  . Stroke Mother   . Heart attack Father    Social History  Substance Use Topics  . Smoking status: Never Smoker   . Smokeless tobacco: None  . Alcohol Use: No    OB History    No data available     Review of Systems  Constitutional: Negative.   HENT: Negative.   Eyes: Negative.   Respiratory: Negative.   Cardiovascular: Positive for chest pain.  Endocrine: Negative.   Genitourinary: Negative.   Musculoskeletal: Negative.   Skin: Negative.   Allergic/Immunologic: Negative.   Neurological: Negative.   Hematological: Negative.   Psychiatric/Behavioral: Negative.     Allergies  Nabumetone and Penicillins  Home Medications   Prior to Admission medications   Medication Sig Start Date End Date Taking? Authorizing Provider  azithromycin (ZITHROMAX) 250 MG tablet As packaged 10/02/14   Leandrew Koyanagi, MD  benzonatate (TESSALON) 100 MG capsule Take 1 capsule (100 mg total) by mouth 2 (two) times daily as needed for cough. 10/02/14   Leandrew Koyanagi, MD  ibuprofen (ADVIL,MOTRIN) 600 MG tablet Take 600 mg by mouth daily as needed for pain (takes a couple times per week).    Historical Provider, MD  metoprolol succinate (TOPROL-XL) 25 MG 24 hr tablet Take 25 mg by mouth daily.    Historical Provider, MD   Meds Ordered and Administered this Visit  Medications - No data to display  BP 183/94 mmHg  Pulse 103  Temp(Src) 99.4 F (37.4 C) (Oral)  Resp 16  SpO2 100%  LMP 09/30/2014 No data found.   Physical Exam  Constitutional: She appears well-developed and well-nourished.  HENT:  Head: Normocephalic and atraumatic.  Right Ear: External ear normal.  Left Ear: External ear normal.  Eyes: Conjunctivae and EOM are normal. Pupils are equal, round, and reactive to light.  Neck: Normal range of motion. Neck supple.  Cardiovascular: Normal rate, regular rhythm and normal heart sounds.   Pulmonary/Chest: Effort normal and breath sounds normal.  Abdominal: Soft. Bowel sounds are normal.  Musculoskeletal: She exhibits no edema or tenderness.  No reproducible chest discomfort in epigastric or costal area.    ED Course   .EKG  Date/Time: 04/30/2016 2:59 PM Performed by: Lysbeth Penner Authorized by: Lysbeth Penner Comparison: not compared with previous ECG  Previous ECG: no previous ECG available Rhythm: sinus rhythm Rate: normal QRS axis: normal ST Segments: ST segments normal T Waves: T waves normal Other: no other findings Clinical impression: normal ECG   (including critical care time)  Labs Review Labs Reviewed - No data to display  Imaging Review No results found.   Visual Acuity Review  Right Eye Distance:   Left Eye Distance:   Bilateral Distance:    Right Eye Near:   Left Eye Near:    Bilateral Near:         MDM  Atypical Chest Pain - Called and spoke with Dr. Tamala Julian Cardiology and he recommends she get established with cardiology and then get some work up and go from there.  Dr. Maryruth Eve made aware of patient H&P and advice of Cardiology Dr. Tamala Julian.  I advised patient if the pain changes or returns  And does not go away then would be good idea to go to the ED.  HTN - Follow up this week with PCP for follow up and tx of hypertension.    Lysbeth Penner, FNP 04/30/16 2149

## 2016-05-04 ENCOUNTER — Other Ambulatory Visit: Payer: Self-pay | Admitting: Internal Medicine

## 2016-05-04 ENCOUNTER — Other Ambulatory Visit: Payer: Self-pay | Admitting: Nurse Practitioner

## 2016-05-04 ENCOUNTER — Ambulatory Visit
Admission: RE | Admit: 2016-05-04 | Discharge: 2016-05-04 | Disposition: A | Discharge status: Home / Self Care | Payer: 59 | Source: Ambulatory Visit | Attending: Internal Medicine | Admitting: Internal Medicine

## 2016-05-04 DIAGNOSIS — R0789 Other chest pain: Secondary | ICD-10-CM

## 2016-05-04 DIAGNOSIS — M549 Dorsalgia, unspecified: Secondary | ICD-10-CM

## 2016-05-04 DIAGNOSIS — M545 Low back pain: Secondary | ICD-10-CM

## 2016-11-22 ENCOUNTER — Ambulatory Visit (INDEPENDENT_AMBULATORY_CARE_PROVIDER_SITE_OTHER): Payer: 59 | Admitting: Orthopaedic Surgery

## 2016-11-22 DIAGNOSIS — M65331 Trigger finger, right middle finger: Secondary | ICD-10-CM | POA: Diagnosis not present

## 2016-11-22 DIAGNOSIS — M65332 Trigger finger, left middle finger: Secondary | ICD-10-CM

## 2016-11-22 MED ORDER — BENZONATATE 100 MG PO CAPS: 100.0000 mg | ORAL_CAPSULE | Freq: Two times a day (BID) | ORAL | 0 refills | Status: DC | PRN

## 2016-11-22 MED ORDER — AZITHROMYCIN 250 MG PO TABS: ORAL_TABLET | ORAL | 0 refills | Status: DC

## 2016-11-22 MED ORDER — LIDOCAINE HCL 1 % IJ SOLN
1.0000 mL | INTRAMUSCULAR | Status: AC | PRN
  Administered 2016-11-22: 1 mL

## 2016-11-22 MED ORDER — METHYLPREDNISOLONE ACETATE 40 MG/ML IJ SUSP
40.0000 mg | INTRAMUSCULAR | Status: AC | PRN
  Administered 2016-11-22: 40 mg

## 2016-11-22 NOTE — Progress Notes (Signed)
Office Visit Note   Patient: Mary Holt           Date of Birth: July 19, 1961           MRN: [id number] Visit Date: 11/22/2016              Requested by: No referring provider defined for this encounter. PCP: No primary care provider on file.   Assessment & Plan: Visit Diagnoses: No diagnosis found.  Plan: I did send in a Z-Pak and Tessalon Perles Four her. She tolerated the injections in the A1 pulley on both middle fingers well. She'll follow up as needed.  Follow-Up Instructions: Return if symptoms worsen or fail to improve.   Orders:  No orders of the defined types were placed in this encounter.  Meds ordered this encounter  Medications  . benzonatate (TESSALON) 100 MG capsule    Sig: Take 1 capsule (100 mg total) by mouth 2 (two) times daily as needed for cough.    Dispense:  20 capsule    Refill:  0  . benzonatate (TESSALON) 100 MG capsule    Sig: Take 1 capsule (100 mg total) by mouth 2 (two) times daily as needed for cough.    Dispense:  20 capsule    Refill:  0  . azithromycin (ZITHROMAX) 250 MG tablet    Sig: As packaged    Dispense:  6 tablet    Refill:  0      Procedures: Hand/UE Inj Date/Time: 11/22/2016 4:26 PM Performed by: Mcarthur Rossetti Authorized by: Mcarthur Rossetti   Condition: trigger finger   Location:  Long finger Site:  L long A1 Medications:  1 mL lidocaine 1 %; 40 mg methylPREDNISolone acetate 40 MG/ML Hand/UE Inj Date/Time: 11/22/2016 4:26 PM Performed by: Mcarthur Rossetti Authorized by: Mcarthur Rossetti   Timeout: prior to procedure the correct patient, procedure, and site was verified   Condition: trigger finger   Location:  Long finger Site:  R long A1 Medications:  1 mL lidocaine 1 %; 40 mg methylPREDNISolone acetate 40 MG/ML     Clinical Data: No additional findings.   Subjective: Chief Complaint  Patient presents with  . Right Hand - Pain    Bilateral middle fingers and right  thumb pain. Wants injections   . Left Hand - Pain    HPI She did ask leg just to have an injection in her A1 pulley of the middle finger on both hands. She said the thumb is actually doing well and does need to have one in her thumb today. She does wish for me to: A Z-Pak for her today as well as Tessalon Perles or chronic cough Review of Systems She has no headache but she does have a significant cough and fullness in her sinuses. She denies any shortness of breath fever chills nausea vomiting or chest pain.  Objective: Vital Signs: LMP 09/30/2014   Physical Exam She is alert and oriented 3 in no acute distress. Ortho Exam He has pain over the A1 pulley of both hands middle fingers. I could not get either the trigger today. She says it does wake her up with triggering. Specialty Comments:  No specialty comments available.  Imaging: No results found.   PMFS History: Patient Active Problem List   Diagnosis Date Noted  . HTN (hypertension) 10/02/2014  . BMI 32.0-32.9,adult 10/02/2014   Past Medical History:  Diagnosis Date  . Heart murmur    asymptomatic  . Hypertension   .  Kidney stones   . Kidney stones    several times, last  one 2014  . Mitral valve prolapse   . MVP (mitral valve prolapse)     Family History  Problem Relation Age of Onset  . Stroke Mother   . Heart attack Father     Past Surgical History:  Procedure Laterality Date  . BACK SURGERY    . BREAST SURGERY     cyst right breast  . DILATATION & CURRETTAGE/HYSTEROSCOPY WITH RESECTOCOPE N/A 08/11/2013   Procedure: DILATATION & CURETTAGE/HYSTEROSCOPY WITH RESECTOCOPE;  Surgeon: Marvene Staff, MD;  Location: [facility];  Service: Gynecology;  Laterality: N/A;  . TONSILLECTOMY    . TRIGGER FINGER RELEASE     left thumb  . TUBAL LIGATION     Social History   Occupational History  . Not on file.   Social History Main Topics  . Smoking status: Never Smoker  . Smokeless tobacco: Not on file  .  Alcohol use No  . Drug use: No  . Sexual activity: Not on file

## 2017-02-01 DIAGNOSIS — Z23 Encounter for immunization: Secondary | ICD-10-CM | POA: Diagnosis not present

## 2017-02-01 DIAGNOSIS — I10 Essential (primary) hypertension: Secondary | ICD-10-CM | POA: Diagnosis not present

## 2017-02-01 DIAGNOSIS — R7309 Other abnormal glucose: Secondary | ICD-10-CM | POA: Diagnosis not present

## 2017-02-15 DIAGNOSIS — I10 Essential (primary) hypertension: Secondary | ICD-10-CM | POA: Diagnosis not present

## 2017-05-03 DIAGNOSIS — R0683 Snoring: Secondary | ICD-10-CM | POA: Diagnosis not present

## 2017-05-03 DIAGNOSIS — G4730 Sleep apnea, unspecified: Secondary | ICD-10-CM | POA: Diagnosis not present

## 2017-05-03 DIAGNOSIS — I10 Essential (primary) hypertension: Secondary | ICD-10-CM | POA: Diagnosis not present

## 2017-06-18 ENCOUNTER — Institutional Professional Consult (permissible substitution): Payer: 59 | Admitting: Neurology

## 2017-07-01 ENCOUNTER — Ambulatory Visit (INDEPENDENT_AMBULATORY_CARE_PROVIDER_SITE_OTHER): Payer: 59

## 2017-07-01 ENCOUNTER — Ambulatory Visit (INDEPENDENT_AMBULATORY_CARE_PROVIDER_SITE_OTHER): Payer: 59 | Admitting: Orthopaedic Surgery

## 2017-07-01 DIAGNOSIS — M25562 Pain in left knee: Secondary | ICD-10-CM

## 2017-07-01 MED ORDER — LIDOCAINE HCL 1 % IJ SOLN
3.0000 mL | INTRAMUSCULAR | Status: AC | PRN
  Administered 2017-07-01: 3 mL

## 2017-07-01 MED ORDER — METHYLPREDNISOLONE ACETATE 40 MG/ML IJ SUSP
40.0000 mg | INTRAMUSCULAR | Status: AC | PRN
Start: 2017-07-01 — End: 2017-07-01
  Administered 2017-07-01: 40 mg via INTRA_ARTICULAR

## 2017-07-01 MED ORDER — MELOXICAM 15 MG PO TABS: 15.0000 mg | ORAL_TABLET | Freq: Every day | ORAL | 0 refills | Status: DC

## 2017-07-01 NOTE — Progress Notes (Signed)
Office Visit Note   Patient: Mary Holt           Date of Birth: Feb 18, 1961           MRN: [id number] Visit Date: 07/01/2017              Requested by: No referring provider defined for this encounter. PCP: Glendale Chard, MD   Assessment & Plan: Visit Diagnoses:  1. Acute pain of left knee     Plan: We talked about accommodation of a steroid injection in her left knee as well as anti-inflammatories for short-term and rest and activity modification. She agreed with all these things. I explained the risks and benefits steroid injections. She tolerated the ejection well. I'll see her back in 2 weeks to see how she is doing overall. This is a knee that I would obtain an MRI of that she is having no resolution of symptoms. She is also work on Forensic scientist exercises.  Follow-Up Instructions: Return in about 2 weeks (around 07/15/2017).   Orders:  Orders Placed This Encounter  Procedures  . Large Joint Injection/Arthrocentesis  . XR Knee 1-2 Views Left   Meds ordered this encounter  Medications  . meloxicam (MOBIC) 15 MG tablet    Sig: Take 1 tablet (15 mg total) by mouth daily.    Dispense:  30 tablet    Refill:  0      Procedures: Large Joint Inj Date/Time: 07/01/2017 3:53 PM Performed by: Mcarthur Rossetti Authorized by: Mcarthur Rossetti   Location:  Knee Site:  L knee Ultrasound Guidance: No   Fluoroscopic Guidance: No   Arthrogram: No   Medications:  3 mL lidocaine 1 %; 40 mg methylPREDNISolone acetate 40 MG/ML     Clinical Data: No additional findings.   Subjective: No chief complaint on file. The patient comes in today with chief complaint left knee pain is been slowly worsening for 2 months now. She has no known injury but stands on her feet all night at work. She comes in limping today said is mainly in the back of her knee but not her calf. She's taking some Tylenol as needed and occasional ibuprofen but I does affect her blood  pressure. She denies any locking catching the points the medial joint line is mainly the source of her pain. Is becoming aggravating to her as well. She's not noted any swelling though.  HPI  Review of Systems She currently denies any headache, chest pain, shortness of breath, fever, chills, nausea, vomiting.  Objective: Vital Signs: LMP 09/30/2014   Physical Exam She is alert or 3 in no distress but obvious discomfort as a relates to her left knee. She is walking with a limp. Ortho Exam Examination of her left knee shows no effusion. The knee is cool. Her range of motion is full but painful. She has negative McMurray's and negative Lachman's exam. There is medial joint line tenderness. Specialty Comments:  No specialty comments available.  Imaging: Xr Knee 1-2 Views Left  Result Date: 07/01/2017 AP and lateral left knee show well-maintained joint space with no acute findings. There is no evidence of fracture or an effusion    PMFS History: Patient Active Problem List   Diagnosis Date Noted  . HTN (hypertension) 10/02/2014  . BMI 32.0-32.9,adult 10/02/2014   Past Medical History:  Diagnosis Date  . Heart murmur    asymptomatic  . Hypertension   . Kidney stones   . Kidney stones  several times, last  one 2014  . Mitral valve prolapse   . MVP (mitral valve prolapse)     Family History  Problem Relation Age of Onset  . Stroke Mother   . Heart attack Father     Past Surgical History:  Procedure Laterality Date  . BACK SURGERY    . BREAST SURGERY     cyst right breast  . DILATATION & CURRETTAGE/HYSTEROSCOPY WITH RESECTOCOPE N/A 08/11/2013   Procedure: DILATATION & CURETTAGE/HYSTEROSCOPY WITH RESECTOCOPE;  Surgeon: Marvene Staff, MD;  Location: [facility];  Service: Gynecology;  Laterality: N/A;  . TONSILLECTOMY    . TRIGGER FINGER RELEASE     left thumb  . TUBAL LIGATION     Social History   Occupational History  . Not on file.   Social History Main  Topics  . Smoking status: Never Smoker  . Smokeless tobacco: Not on file  . Alcohol use No  . Drug use: No  . Sexual activity: Not on file

## 2017-07-09 ENCOUNTER — Telehealth (INDEPENDENT_AMBULATORY_CARE_PROVIDER_SITE_OTHER): Payer: Self-pay | Admitting: Radiology

## 2017-07-09 ENCOUNTER — Encounter (INDEPENDENT_AMBULATORY_CARE_PROVIDER_SITE_OTHER): Payer: Self-pay | Admitting: Orthopaedic Surgery

## 2017-07-09 ENCOUNTER — Ambulatory Visit (INDEPENDENT_AMBULATORY_CARE_PROVIDER_SITE_OTHER): Payer: 59 | Admitting: Orthopaedic Surgery

## 2017-07-09 DIAGNOSIS — M25562 Pain in left knee: Secondary | ICD-10-CM | POA: Diagnosis not present

## 2017-07-09 NOTE — Telephone Encounter (Signed)
error 

## 2017-07-09 NOTE — Addendum Note (Signed)
Addended by: Precious Bard on: 07/09/2017 01:45 PM   Modules accepted: Orders

## 2017-07-09 NOTE — Progress Notes (Signed)
Office Visit Note   Patient: Mary Holt           Date of Birth: Feb 06, 1961           MRN: [id number] Visit Date: 07/09/2017              Requested by: Glendale Chard, [address] [place], [place] [address] PCP: Glendale Chard, MD   Assessment & Plan: Visit Diagnoses:  1. Acute pain of left knee     Plan: Overall impression is left knee pain concerning for stress fracture of the tibial plateau. MRI ordered today ASAP. Crutches and out of work note given. Follow-up with Dr. Rush Farmer.  Follow-Up Instructions: Return if symptoms worsen or fail to improve.   Orders:  No orders of the defined types were placed in this encounter.  No orders of the defined types were placed in this encounter.     Procedures: No procedures performed   Clinical Data: No additional findings.   Subjective: Chief Complaint  Patient presents with  . Left Knee - Pain, Follow-up    Patient is a 56 year old female for follow-up of severe left knee pain. Injection that was provided last week helped for a couple days. Now she is having severe pain with weightbearing. She is unable to really ambulate without significant limping. She denies any swelling.    Review of Systems  Constitutional: Negative.   HENT: Negative.   Eyes: Negative.   Respiratory: Negative.   Cardiovascular: Negative.   Endocrine: Negative.   Musculoskeletal: Negative.   Neurological: Negative.   Hematological: Negative.   Psychiatric/Behavioral: Negative.   All other systems reviewed and are negative.    Objective: Vital Signs: LMP 09/30/2014   Physical Exam  Constitutional: She is oriented to person, place, and time. She appears well-developed and well-nourished.  Pulmonary/Chest: Effort normal.  Neurological: She is alert and oriented to person, place, and time.  Skin: Skin is warm. Capillary refill takes less than 2 seconds.  Psychiatric: She has a normal mood and affect. Her behavior is  normal. Judgment and thought content normal.  Nursing note and vitals reviewed.   Ortho Exam Left knee exam shows no joint effusion. She is very tender along the proximal medial portion of the tibia. She is walking with a severe limp and with a very short gait Specialty Comments:  No specialty comments available.  Imaging: No results found.   PMFS History: Patient Active Problem List   Diagnosis Date Noted  . HTN (hypertension) 10/02/2014  . BMI 32.0-32.9,adult 10/02/2014   Past Medical History:  Diagnosis Date  . Heart murmur    asymptomatic  . Hypertension   . Kidney stones   . Kidney stones    several times, last  one 2014  . Mitral valve prolapse   . MVP (mitral valve prolapse)     Family History  Problem Relation Age of Onset  . Stroke Mother   . Heart attack Father     Past Surgical History:  Procedure Laterality Date  . BACK SURGERY    . BREAST SURGERY     cyst right breast  . DILATATION & CURRETTAGE/HYSTEROSCOPY WITH RESECTOCOPE N/A 08/11/2013   Procedure: DILATATION & CURETTAGE/HYSTEROSCOPY WITH RESECTOCOPE;  Surgeon: Marvene Staff, MD;  Location: [facility];  Service: Gynecology;  Laterality: N/A;  . TONSILLECTOMY    . TRIGGER FINGER RELEASE     left thumb  . TUBAL LIGATION     Social History   Occupational  History  . Not on file.   Social History Main Topics  . Smoking status: Never Smoker  . Smokeless tobacco: Never Used  . Alcohol use No  . Drug use: No  . Sexual activity: Not on file

## 2017-07-12 ENCOUNTER — Telehealth (INDEPENDENT_AMBULATORY_CARE_PROVIDER_SITE_OTHER): Payer: Self-pay | Admitting: Orthopaedic Surgery

## 2017-07-12 NOTE — Telephone Encounter (Signed)
I received a request for records from [facility]. It did not have signed release.  I faxed back informing that we must have pts authorization before we can process their request.

## 2017-07-15 ENCOUNTER — Ambulatory Visit (INDEPENDENT_AMBULATORY_CARE_PROVIDER_SITE_OTHER): Payer: 59 | Admitting: Orthopaedic Surgery

## 2017-07-19 ENCOUNTER — Ambulatory Visit
Admission: RE | Admit: 2017-07-19 | Discharge: 2017-07-19 | Disposition: A | Discharge status: Home / Self Care | Payer: 59 | Source: Ambulatory Visit | Attending: Orthopaedic Surgery | Admitting: Orthopaedic Surgery

## 2017-07-19 DIAGNOSIS — M25562 Pain in left knee: Secondary | ICD-10-CM

## 2017-07-19 DIAGNOSIS — M179 Osteoarthritis of knee, unspecified: Secondary | ICD-10-CM | POA: Diagnosis not present

## 2017-07-23 ENCOUNTER — Telehealth (INDEPENDENT_AMBULATORY_CARE_PROVIDER_SITE_OTHER): Payer: Self-pay | Admitting: Orthopaedic Surgery

## 2017-07-23 NOTE — Telephone Encounter (Signed)
Patient called asked if Dr Ninfa Linden will call her with the results of her MRI. The number to contact patient is [phone]

## 2017-07-23 NOTE — Telephone Encounter (Signed)
Please advise 

## 2017-07-24 ENCOUNTER — Other Ambulatory Visit (INDEPENDENT_AMBULATORY_CARE_PROVIDER_SITE_OTHER): Payer: Self-pay

## 2017-07-24 ENCOUNTER — Encounter (INDEPENDENT_AMBULATORY_CARE_PROVIDER_SITE_OTHER): Payer: Self-pay

## 2017-07-24 ENCOUNTER — Telehealth (INDEPENDENT_AMBULATORY_CARE_PROVIDER_SITE_OTHER): Payer: Self-pay | Admitting: Orthopaedic Surgery

## 2017-07-24 DIAGNOSIS — M25562 Pain in left knee: Secondary | ICD-10-CM

## 2017-07-24 NOTE — Telephone Encounter (Signed)
I did speak to her on the phone. Please set up some outpatient physical therapy to work on strengthening her knee as well as any modalities to help calm down her knee pain. I talked her about the MRI and it had no significant findings. Also have her work and at some point keep her out of work the next 3-4 weeks as she is participating in therapy to get her knee to feel better.

## 2017-07-24 NOTE — Telephone Encounter (Signed)
She coming to pick the note up?

## 2017-07-24 NOTE — Telephone Encounter (Signed)
Re-faxed to [employer] that records cannot be faxed until we receive authorization

## 2017-07-29 ENCOUNTER — Ambulatory Visit (INDEPENDENT_AMBULATORY_CARE_PROVIDER_SITE_OTHER): Payer: 59 | Admitting: Orthopaedic Surgery

## 2017-07-30 ENCOUNTER — Encounter: Payer: Self-pay | Admitting: Physical Therapy

## 2017-07-30 ENCOUNTER — Ambulatory Visit: Payer: 59 | Attending: Internal Medicine | Admitting: Physical Therapy

## 2017-07-30 DIAGNOSIS — M6281 Muscle weakness (generalized): Secondary | ICD-10-CM | POA: Diagnosis not present

## 2017-07-30 DIAGNOSIS — R262 Difficulty in walking, not elsewhere classified: Secondary | ICD-10-CM | POA: Diagnosis present

## 2017-07-30 DIAGNOSIS — M25562 Pain in left knee: Secondary | ICD-10-CM | POA: Diagnosis not present

## 2017-07-30 NOTE — Therapy (Signed)
[place], [place], [address] Phone: [phone]   Fax:  [phone]  Physical Therapy Evaluation  Patient Details  Name: Mary Holt MRN: [id number] Date of Birth: [birth year] Referring Provider: Mcarthur Rossetti, MD  Encounter Date: 07/30/2017      PT End of Session - 07/30/17 1146    Visit Number 1   Number of Visits 17   Date for PT Re-Evaluation 09/27/17   Authorization Type U[employer]- 60 visit limit   PT Start Time 1136   PT Stop Time 1222   PT Time Calculation (min) 46 min   Activity Tolerance Patient tolerated treatment well   Behavior During Therapy [facility] for tasks assessed/performed      Past Medical History:  Diagnosis Date  . Heart murmur    asymptomatic  . Hypertension   . Kidney stones   . Kidney stones    several times, last  one 2014  . Mitral valve prolapse   . MVP (mitral valve prolapse)     Past Surgical History:  Procedure Laterality Date  . BACK SURGERY    . BREAST SURGERY     cyst right breast  . DILATATION & CURRETTAGE/HYSTEROSCOPY WITH RESECTOCOPE N/A 08/11/2013   Procedure: DILATATION & CURETTAGE/HYSTEROSCOPY WITH RESECTOCOPE;  Surgeon: Marvene Staff, MD;  Location: [facility];  Service: Gynecology;  Laterality: N/A;  . TONSILLECTOMY    . TRIGGER FINGER RELEASE     left thumb  . TUBAL LIGATION      There were no vitals filed for this visit.       Subjective Assessment - 07/30/17 1135    Subjective A few mo prior to MRI felt something not right behind L knee, caused her to limp. Steriod injection which was helpful for a few days but got worse after going back to work. Works in a [employer] for 19 yr, standing all day. Overall pain is better but something is not quite right on medial & post aspect of knee. Currently out of work. Walks up/down street for exercise (not lately). No set date for return to work.    How long can you stand comfortably? able to walk  grocery store, taking time   Patient Stated Goals return to work, standing   Currently in Pain? Yes   Pain Score 2    Pain Location Knee   Pain Orientation Left   Pain Descriptors / Indicators Sharp   Aggravating Factors  walking/standing for long periods   Pain Relieving Factors rest            [facility] PT Assessment - 07/30/17 0001      Assessment   Medical Diagnosis Left knee pain   Referring Provider Mcarthur Rossetti, MD   Onset Date/Surgical Date --  approx 2 mo ago   Hand Dominance Right   Prior Therapy not this year     Precautions   Precautions None     Restrictions   Weight Bearing Restrictions No     Balance Screen   Has the patient fallen in the past 6 months No     [place] residence   Living Arrangements Alone   Additional Comments a few steps to enter home     Prior Function   Level of Independence Independent   Vocation Other (comment)  currently out of work     Cognition   Overall Cognitive Status Within Functional Limits for tasks assessed  Observation/Other Assessments   Focus on Therapeutic Outcomes (FOTO)  49% limited     Sensation   Additional Comments [facility]     Coordination   9 Hole Peg Test --     Posture/Postural Control   Posture Comments slight flexion at hips upon stand to walk     ROM / Strength   AROM / PROM / Strength Strength     Strength   Strength Assessment Site Hip;Knee   Right/Left Hip Right;Left   Right Hip Flexion 4/5   Right Hip Extension 4/5   Right Hip ABduction 4/5   Left Hip Flexion 3+/5   Left Hip Extension 3/5   Left Hip ABduction 3+/5   Right/Left Knee Right;Left   Right Knee Flexion 5/5   Right Knee Extension 5/5   Left Knee Flexion 4+/5   Left Knee Extension 4+/5     Palpation   Patella mobility limited lateral tracking    Palpation comment Limited L HS flexibility     Ambulation/Gait   Gait Comments mild antalgic pattern noted             Objective measurements completed on examination: See above findings.          [facility] Adult PT Treatment/Exercise - 07/30/17 0001      Exercises   Exercises Knee/Hip     Knee/Hip Exercises: Stretches   Passive Hamstring Stretch Limitations supine with green strap, seated   Hip Flexor Stretch Limitations thomas test position, at wall, 1/2 kneeling   Piriformis Stretch Limitations figure 4 supine & seated     Knee/Hip Exercises: Supine   Bridges with Clamshell 20 reps  green tband                PT Education - 07/30/17 1252    Education provided Yes   Education Details anatomy of condition, POC, HEP, exercise form/rationale   Person(s) Educated Patient   Methods Explanation;Demonstration;Tactile cues;Verbal cues;Handout   Comprehension Verbalized understanding;Returned demonstration;Verbal cues required;Tactile cues required;Need further instruction          PT Short Term Goals - 07/30/17 1239      PT SHORT TERM GOAL #1   Title Pt will verbalize ability to incorporate stretches into daily activities   Baseline began educating at eval   Time 4   Period Weeks   Status New   Target Date 08/30/17     PT SHORT TERM GOAL #2   Title Gross hip strength to 4/5 bilat   Baseline see flowsheet   Time 4   Period Weeks   Status New   Target Date 08/30/17           PT Long Term Goals - 07/30/17 1240      PT LONG TERM GOAL #1   Title Gross hip strength to 5/5 for proper support to biomechanical chain   Baseline see flowsheet   Time 8   Period Weeks   Status New   Target Date 09/27/17     PT LONG TERM GOAL #2   Title Pt will be able to return to work with pain <=3/10 through the day, manage with proper stretch/rest breaks   Baseline not currently at work   Time 8   Period Weeks   Status New   Target Date 09/27/17     PT LONG TERM GOAL #3   Title Pt will be able to return to walking program for long term exercise   Baseline not walking  right now  due to pain   Time 8   Period Weeks   Status New   Target Date 09/27/17     PT LONG TERM GOAL #4   Title FOTO to 35% limitation to indicate significant improvement in functional ability   Baseline 49% limitation at eval   Time 8   Period Weeks   Status New   Target Date 09/27/17                Plan - 07/30/17 1231    Clinical Impression Statement Pt presents to PT with complaints of L knee pain that began insidiously about 2 months ago. MRI shows ACL strain and diffuse degenerative changes. Significant weakness noted in bilateral hips providing poor proximal support to distal control. Pt will benefit from skilled PT in order to improve strength to provide support to knee joint and meet long term goals.    History and Personal Factors relevant to plan of care: HTN, mitral valve prolapse   Clinical Presentation Stable   Clinical Presentation due to: n/a   Clinical Decision Making Low   Rehab Potential Good   PT Frequency 2x / week   PT Duration 8 weeks   PT Treatment/Interventions ADLs/Self Care Home Management;Cryotherapy;Electrical Stimulation;Iontophoresis 4mg /ml Dexamethasone;Functional mobility training;Stair training;Gait training;Ultrasound;Traction;Moist Heat;Therapeutic activities;Therapeutic exercise;Balance training;Neuromuscular re-education;Patient/family education;Passive range of motion;Manual techniques;Dry needling;Taping   PT Next Visit Plan gross hip strength, reformer   PT Home Exercise Plan hip flexor, hamstring, gastroc, piriformis stretches; bridge with clam    Consulted and Agree with Plan of Care Patient      Patient will benefit from skilled therapeutic intervention in order to improve the following deficits and impairments:  Difficulty walking, Increased muscle spasms, Decreased activity tolerance, Decreased endurance, Pain, Improper body mechanics, Impaired flexibility, Decreased balance, Decreased strength, Postural dysfunction  Visit  Diagnosis: Acute pain of left knee - Plan: PT plan of care cert/re-cert  Difficulty in walking, not elsewhere classified - Plan: PT plan of care cert/re-cert  Muscle weakness (generalized) - Plan: PT plan of care cert/re-cert     Problem List Patient Active Problem List   Diagnosis Date Noted  . HTN (hypertension) 10/02/2014  . BMI 32.0-32.9,adult 10/02/2014   Emory Leaver C. Nevea Spiewak PT, DPT 07/30/17 12:55 PM   [facility] [address] [place], [place], [address] Phone: [phone]   Fax:  [phone]  Name: Mary Holt MRN: [id number] Date of Birth: July 18, 1961

## 2017-07-31 ENCOUNTER — Encounter: Payer: Self-pay | Admitting: Physical Therapy

## 2017-07-31 ENCOUNTER — Ambulatory Visit: Payer: 59 | Admitting: Physical Therapy

## 2017-07-31 DIAGNOSIS — M6281 Muscle weakness (generalized): Secondary | ICD-10-CM

## 2017-07-31 DIAGNOSIS — M25562 Pain in left knee: Secondary | ICD-10-CM

## 2017-07-31 DIAGNOSIS — R262 Difficulty in walking, not elsewhere classified: Secondary | ICD-10-CM

## 2017-07-31 NOTE — Therapy (Signed)
[place], [place], [address] Phone: [phone]   Fax:  [phone]  Physical Therapy Treatment  Patient Details  Name: Mary Holt MRN: [id number] Date of Birth: September 02, 1961 Referring Provider: Mcarthur Rossetti, MD  Encounter Date: 07/31/2017      PT End of Session - 07/31/17 1145    Visit Number 2   Number of Visits 17   Date for PT Re-Evaluation 09/27/17   Authorization Type UHC- 60 visit limit   PT Start Time 1145   PT Stop Time 1226   PT Time Calculation (min) 41 min   Activity Tolerance Patient tolerated treatment well   Behavior During Therapy [facility] for tasks assessed/performed      Past Medical History:  Diagnosis Date  . Heart murmur    asymptomatic  . Hypertension   . Kidney stones   . Kidney stones    several times, last  one 2014  . Mitral valve prolapse   . MVP (mitral valve prolapse)     Past Surgical History:  Procedure Laterality Date  . BACK SURGERY    . BREAST SURGERY     cyst right breast  . DILATATION & CURRETTAGE/HYSTEROSCOPY WITH RESECTOCOPE N/A 08/11/2013   Procedure: DILATATION & CURETTAGE/HYSTEROSCOPY WITH RESECTOCOPE;  Surgeon: Marvene Staff, MD;  Location: [facility];  Service: Gynecology;  Laterality: N/A;  . TONSILLECTOMY    . TRIGGER FINGER RELEASE     left thumb  . TUBAL LIGATION      There were no vitals filed for this visit.      Subjective Assessment - 07/31/17 1145    Subjective Not too bad today. Is sleepy today because she has her grand son this week who wakes her up a few times at night.                          [place] Adult PT Treatment/Exercise - 07/31/17 0001      Knee/Hip Exercises: Stretches   Passive Hamstring Stretch Limitations supine with green strap   Hip Flexor Stretch Limitations thomas test position   Piriformis Stretch Limitations figure 4 supine   Gastroc Stretch 30 seconds   Gastroc Stretch  Limitations slant board     Knee/Hip Exercises: Aerobic   Nustep 5 min L 5     Knee/Hip Exercises: Standing   Heel Raises 20 reps;Both   Gait Training heel toe pattern     Knee/Hip Exercises: Supine   Bridges Limitations in DF, arms over chest x20   Straight Leg Raises Both;10 reps   Straight Leg Raise with External Rotation Both;10 reps   Straight Leg Raise with External Rotation Limitations alternating neutral & ER in one exercise      Knee/Hip Exercises: Sidelying   Hip ADduction Both;15 reps   Clams x30 each                PT Education - 07/30/17 1252    Education provided Yes   Education Details anatomy of condition, POC, HEP, exercise form/rationale   Person(s) Educated Patient   Methods Explanation;Demonstration;Tactile cues;Verbal cues;Handout   Comprehension Verbalized understanding;Returned demonstration;Verbal cues required;Tactile cues required;Need further instruction          PT Short Term Goals - 07/30/17 1239      PT SHORT TERM GOAL #1   Title Pt will verbalize ability to incorporate stretches into daily activities   Baseline began educating at eval  Time 4   Period Weeks   Status New   Target Date 08/30/17     PT SHORT TERM GOAL #2   Title Gross hip strength to 4/5 bilat   Baseline see flowsheet   Time 4   Period Weeks   Status New   Target Date 08/30/17           PT Long Term Goals - 07/30/17 1240      PT LONG TERM GOAL #1   Title Gross hip strength to 5/5 for proper support to biomechanical chain   Baseline see flowsheet   Time 8   Period Weeks   Status New   Target Date 09/27/17     PT LONG TERM GOAL #2   Title Pt will be able to return to work with pain <=3/10 through the day, manage with proper stretch/rest breaks   Baseline not currently at work   Time 8   Period Weeks   Status New   Target Date 09/27/17     PT LONG TERM GOAL #3   Title Pt will be able to return to walking program for long term exercise    Baseline not walking right now due to pain   Time 8   Period Weeks   Status New   Target Date 09/27/17     PT LONG TERM GOAL #4   Title FOTO to 35% limitation to indicate significant improvement in functional ability   Baseline 49% limitation at eval   Time 8   Period Weeks   Status New   Target Date 09/27/17               Plan - 07/31/17 1244    Clinical Impression Statement Good tolerance to exercise, fatigue noted without increase in pain. Asked pt to focus on heel-toe gait and try stretching as much as possible. Will add more to HEP after she is finished babysitting her grandson next week.    PT Treatment/Interventions ADLs/Self Care Home Management;Cryotherapy;Electrical Stimulation;Iontophoresis 4mg /ml Dexamethasone;Functional mobility training;Stair training;Gait training;Ultrasound;Traction;Moist Heat;Therapeutic activities;Therapeutic exercise;Balance training;Neuromuscular re-education;Patient/family education;Passive range of motion;Manual techniques;Dry needling;Taping   PT Next Visit Plan gross hip strength, reformer   PT Home Exercise Plan hip flexor, hamstring, gastroc, piriformis stretches; bridge with clam    Consulted and Agree with Plan of Care Patient      Patient will benefit from skilled therapeutic intervention in order to improve the following deficits and impairments:  Difficulty walking, Increased muscle spasms, Decreased activity tolerance, Decreased endurance, Pain, Improper body mechanics, Impaired flexibility, Decreased balance, Decreased strength, Postural dysfunction  Visit Diagnosis: Acute pain of left knee  Difficulty in walking, not elsewhere classified  Muscle weakness (generalized)     Problem List Patient Active Problem List   Diagnosis Date Noted  . HTN (hypertension) 10/02/2014  . BMI 32.0-32.9,adult 10/02/2014   Destini Cambre C. Kalis Friese PT, DPT 07/31/17 12:46 PM   [place] [facility] [address] [place], [place], [address] Phone: [phone]   Fax:  [phone]  Name: Mary Holt MRN: [id number] Date of Birth: 02-19-1961

## 2017-08-06 ENCOUNTER — Ambulatory Visit: Payer: 59 | Admitting: Physical Therapy

## 2017-08-06 ENCOUNTER — Encounter: Payer: Self-pay | Admitting: Physical Therapy

## 2017-08-06 DIAGNOSIS — R262 Difficulty in walking, not elsewhere classified: Secondary | ICD-10-CM

## 2017-08-06 DIAGNOSIS — M25562 Pain in left knee: Secondary | ICD-10-CM | POA: Diagnosis not present

## 2017-08-06 DIAGNOSIS — M6281 Muscle weakness (generalized): Secondary | ICD-10-CM

## 2017-08-06 NOTE — Therapy (Signed)
[place], [place], [address] Phone: [phone]   Fax:  [phone]  Physical Therapy Treatment  Patient Details  Name: CHANA LINDSTROM MRN: [id number] Date of Birth: [birth year] Referring Provider: Mcarthur Rossetti, MD  Encounter Date: 08/06/2017      PT End of Session - 08/06/17 1740    Visit Number 3   Number of Visits 17   Date for PT Re-Evaluation 09/27/17   PT Start Time 1633   PT Stop Time 1716   PT Time Calculation (min) 43 min   Activity Tolerance Patient tolerated treatment well   Behavior During Therapy [facility] for tasks assessed/performed      Past Medical History:  Diagnosis Date  . Heart murmur    asymptomatic  . Hypertension   . Kidney stones   . Kidney stones    several times, last  one 2014  . Mitral valve prolapse   . MVP (mitral valve prolapse)     Past Surgical History:  Procedure Laterality Date  . BACK SURGERY    . BREAST SURGERY     cyst right breast  . DILATATION & CURRETTAGE/HYSTEROSCOPY WITH RESECTOCOPE N/A 08/11/2013   Procedure: DILATATION & CURETTAGE/HYSTEROSCOPY WITH RESECTOCOPE;  Surgeon: Marvene Staff, MD;  Location: [facility];  Service: Gynecology;  Laterality: N/A;  . TONSILLECTOMY    . TRIGGER FINGER RELEASE     left thumb  . TUBAL LIGATION      There were no vitals filed for this visit.                       [facility] Adult PT Treatment/Exercise - 08/06/17 0001      Knee/Hip Exercises: Stretches   Passive Hamstring Stretch 3 reps;30 seconds  and right 1 rep 30 seconds   Gastroc Stretch 3 reps;30 seconds   Gastroc Stretch Limitations slant board     Knee/Hip Exercises: Aerobic   Nustep 7 minutes ,  L5  354 steps     Knee/Hip Exercises: Standing   SLS with Vectors SLS with leaning forward to touch cone on floor , SBA 3 x each side,  wobbles.     Knee/Hip Exercises: Seated   Hamstring Curl 10 reps;2 sets;Both   Sit to  [employer] 10 reps  cue initially     Knee/Hip Exercises: Supine   Bridges Limitations 5 reps both and 5 reps each single,  min assist with lifted leg,  small lifts off mat noted.   Other Supine Knee/Hip Exercises reformer exercises various leg positions, and foot positions,  cues  double and single      Knee/Hip Exercises: Sidelying   Clams 1o x each side                  PT Short Term Goals - 07/30/17 1239      PT SHORT TERM GOAL #1   Title Pt will verbalize ability to incorporate stretches into daily activities   Baseline began educating at eval   Time 4   Period Weeks   Status New   Target Date 08/30/17     PT SHORT TERM GOAL #2   Title Gross hip strength to 4/5 bilat   Baseline see flowsheet   Time 4   Period Weeks   Status New   Target Date 08/30/17           PT Long Term Goals - 07/30/17 1240  PT LONG TERM GOAL #1   Title Gross hip strength to 5/5 for proper support to biomechanical chain   Baseline see flowsheet   Time 8   Period Weeks   Status New   Target Date 09/27/17     PT LONG TERM GOAL #2   Title Pt will be able to return to work with pain <=3/10 through the day, manage with proper stretch/rest breaks   Baseline not currently at work   Time 8   Period Weeks   Status New   Target Date 09/27/17     PT LONG TERM GOAL #3   Title Pt will be able to return to walking program for long term exercise   Baseline not walking right now due to pain   Time 8   Period Weeks   Status New   Target Date 09/27/17     PT LONG TERM GOAL #4   Title FOTO to 35% limitation to indicate significant improvement in functional ability   Baseline 49% limitation at eval   Time 8   Period Weeks   Status New   Target Date 09/27/17               Plan - 08/06/17 1740    PT Treatment/Interventions ADLs/Self Care Home Management;Cryotherapy;Electrical Stimulation;Iontophoresis 4mg /ml Dexamethasone;Functional mobility training;Stair training;Gait  training;Ultrasound;Traction;Moist Heat;Therapeutic activities;Therapeutic exercise;Balance training;Neuromuscular re-education;Patient/family education;Passive range of motion;Manual techniques;Dry needling;Taping   PT Next Visit Plan gross hip strength, reformer   PT Home Exercise Plan hip flexor, hamstring, gastroc, piriformis stretches; bridge with clam       Patient will benefit from skilled therapeutic intervention in order to improve the following deficits and impairments:  Difficulty walking, Increased muscle spasms, Decreased activity tolerance, Decreased endurance, Pain, Improper body mechanics, Impaired flexibility, Decreased balance, Decreased strength, Postural dysfunction  Visit Diagnosis: Acute pain of left knee  Difficulty in walking, not elsewhere classified  Muscle weakness (generalized)     Problem List Patient Active Problem List   Diagnosis Date Noted  . HTN (hypertension) 10/02/2014  . BMI 32.0-32.9,adult 10/02/2014    HARRIS,KAREN PTA 08/06/2017, 5:42 PM  [facility] [address] [place], [place], [address] Phone: [phone]   Fax:  [phone]  Name: MANAIA SAMAD MRN: [id number] Date of Birth: Nov 08, 1961

## 2017-08-09 ENCOUNTER — Encounter: Payer: Self-pay | Admitting: Physical Therapy

## 2017-08-09 ENCOUNTER — Ambulatory Visit: Payer: 59 | Admitting: Physical Therapy

## 2017-08-09 DIAGNOSIS — R262 Difficulty in walking, not elsewhere classified: Secondary | ICD-10-CM

## 2017-08-09 DIAGNOSIS — M25562 Pain in left knee: Secondary | ICD-10-CM

## 2017-08-09 DIAGNOSIS — M6281 Muscle weakness (generalized): Secondary | ICD-10-CM

## 2017-08-09 NOTE — Therapy (Signed)
[place], [place], [address] Phone: [phone]   Fax:  [phone]  Physical Therapy Treatment  Patient Details  Name: Mary Holt MRN: [id number] Date of Birth: May 13, 1961 Referring Provider: Mcarthur Rossetti, MD  Encounter Date: 08/09/2017      PT End of Session - 08/09/17 1058    Visit Number 4   Number of Visits 17   Date for PT Re-Evaluation 09/27/17   Authorization Type UHC- 60 visit limit   PT Start Time 1100   PT Stop Time 1144   PT Time Calculation (min) 44 min   Activity Tolerance Patient tolerated treatment well   Behavior During Therapy [facility] for tasks assessed/performed      Past Medical History:  Diagnosis Date  . Heart murmur    asymptomatic  . Hypertension   . Kidney stones   . Kidney stones    several times, last  one 2014  . Mitral valve prolapse   . MVP (mitral valve prolapse)     Past Surgical History:  Procedure Laterality Date  . BACK SURGERY    . BREAST SURGERY     cyst right breast  . DILATATION & CURRETTAGE/HYSTEROSCOPY WITH RESECTOCOPE N/A 08/11/2013   Procedure: DILATATION & CURETTAGE/HYSTEROSCOPY WITH RESECTOCOPE;  Surgeon: Marvene Staff, MD;  Location: [facility];  Service: Gynecology;  Laterality: N/A;  . TONSILLECTOMY    . TRIGGER FINGER RELEASE     left thumb  . TUBAL LIGATION      There were no vitals filed for this visit.      Subjective Assessment - 08/09/17 1058    Subjective Lat night noticed that knee began bothering her a little more, right on the bone. Has been getting down on the floor to play with her grand son. Hurts to press on medial joint line. Notable ambulation with lack of extension. 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    Patient Stated Goals return to work, standing   Currently in Pain? Yes   Pain Score 6    Pain Location Knee   Pain Orientation Left;Medial   Pain Descriptors /  Indicators --  bruised   Aggravating Factors  pressing on medial joint line   Pain Relieving Factors rest                         [facility] Adult PT Treatment/Exercise - 08/09/17 0001      Knee/Hip Exercises: Stretches   Passive Hamstring Stretch Both;2 reps;30 seconds   Passive Hamstring Stretch Limitations seated with strap   Other Knee/Hip Stretches seated figure 4 stretch     Knee/Hip Exercises: Aerobic   Nustep 5 min L5     Knee/Hip Exercises: Standing   Heel Raises Limitations toe raises with UE support for balance   Rocker Board Limitations A/P & lateral   Gait Training heel strike with TKE   Other Standing Knee Exercises TKE blue tband   Other Standing Knee Exercises hip hike 1 foot unweighted up on step     Knee/Hip Exercises: Supine   Straight Leg Raises 15 reps;Both   Straight Leg Raise with External Rotation 15 reps;Both     Knee/Hip Exercises: Sidelying   Hip ABduction 20 reps;Both                PT Education - 08/09/17 1317    Education provided Yes   Education Details importance of  stretching, gait pattern   Person(s) Educated Patient   Methods Demonstration;Explanation;Tactile cues;Verbal cues;Handout   Comprehension Verbalized understanding;Returned demonstration;Verbal cues required;Tactile cues required;Need further instruction          PT Short Term Goals - 07/30/17 1239      PT SHORT TERM GOAL #1   Title Pt will verbalize ability to incorporate stretches into daily activities   Baseline began educating at eval   Time 4   Period Weeks   Status New   Target Date 08/30/17     PT SHORT TERM GOAL #2   Title Gross hip strength to 4/5 bilat   Baseline see flowsheet   Time 4   Period Weeks   Status New   Target Date 08/30/17           PT Long Term Goals - 07/30/17 1240      PT LONG TERM GOAL #1   Title Gross hip strength to 5/5 for proper support to biomechanical chain   Baseline see flowsheet   Time 8   Period  Weeks   Status New   Target Date 09/27/17     PT LONG TERM GOAL #2   Title Pt will be able to return to work with pain <=3/10 through the day, manage with proper stretch/rest breaks   Baseline not currently at work   Time 8   Period Weeks   Status New   Target Date 09/27/17     PT LONG TERM GOAL #3   Title Pt will be able to return to walking program for long term exercise   Baseline not walking right now due to pain   Time 8   Period Weeks   Status New   Target Date 09/27/17     PT LONG TERM GOAL #4   Title FOTO to 35% limitation to indicate significant improvement in functional ability   Baseline 49% limitation at eval   Time 8   Period Weeks   Status New   Target Date 09/27/17               Plan - 08/09/17 1145    Clinical Impression Statement Notable flat foot ambulation which was improved with cuing and exercises. Has to stand on L leg at work while R reaches up to press peddal. Will cont to work on balance and glut med stretngth.    PT Treatment/Interventions ADLs/Self Care Home Management;Cryotherapy;Electrical Stimulation;Iontophoresis 4mg /ml Dexamethasone;Functional mobility training;Stair training;Gait training;Ultrasound;Traction;Moist Heat;Therapeutic activities;Therapeutic exercise;Balance training;Neuromuscular re-education;Patient/family education;Passive range of motion;Manual techniques;Dry needling;Taping   PT Next Visit Plan gross hip strength, reformer   PT Home Exercise Plan hip flexor, hamstring, gastroc, piriformis stretches; bridge with clam; SLR neutral/ER/abd; stand equally on feet, heel strike;    Consulted and Agree with Plan of Care Patient      Patient will benefit from skilled therapeutic intervention in order to improve the following deficits and impairments:  Difficulty walking, Increased muscle spasms, Decreased activity tolerance, Decreased endurance, Pain, Improper body mechanics, Impaired flexibility, Decreased balance, Decreased  strength, Postural dysfunction  Visit Diagnosis: Acute pain of left knee  Difficulty in walking, not elsewhere classified  Muscle weakness (generalized)     Problem List Patient Active Problem List   Diagnosis Date Noted  . HTN (hypertension) 10/02/2014  . BMI 32.0-32.9,adult 10/02/2014    Elfie Costanza C. Joseth Weigel PT, DPT 08/09/17 1:18 PM   [facility] [address] [place], [place], [address] Phone: [phone]   Fax:  [phone]  Name: ELISAVET BUEHRER MRN: [id number] Date of Birth: May 04, 1961

## 2017-08-13 ENCOUNTER — Encounter: Payer: Self-pay | Admitting: Physical Therapy

## 2017-08-13 ENCOUNTER — Ambulatory Visit: Payer: 59 | Admitting: Physical Therapy

## 2017-08-13 DIAGNOSIS — M25562 Pain in left knee: Secondary | ICD-10-CM | POA: Diagnosis not present

## 2017-08-13 DIAGNOSIS — M6281 Muscle weakness (generalized): Secondary | ICD-10-CM

## 2017-08-13 DIAGNOSIS — R262 Difficulty in walking, not elsewhere classified: Secondary | ICD-10-CM

## 2017-08-13 NOTE — Therapy (Signed)
[place], [place], [address] Phone: [phone]   Fax:  [phone]  Physical Therapy Treatment  Patient Details  Name: Mary Holt MRN: [id number] Date of Birth: January 13, 1961 Referring Provider: Mcarthur Rossetti, MD  Encounter Date: 08/13/2017      PT End of Session - 08/13/17 1145    Visit Number 5   Number of Visits 17   Date for PT Re-Evaluation 09/27/17   Authorization Type UHC- 60 visit limit   PT Start Time 1145   PT Stop Time 1227   PT Time Calculation (min) 42 min   Activity Tolerance Patient tolerated treatment well   Behavior During Therapy [facility] for tasks assessed/performed      Past Medical History:  Diagnosis Date  . Heart murmur    asymptomatic  . Hypertension   . Kidney stones   . Kidney stones    several times, last  one 2014  . Mitral valve prolapse   . MVP (mitral valve prolapse)     Past Surgical History:  Procedure Laterality Date  . BACK SURGERY    . BREAST SURGERY     cyst right breast  . DILATATION & CURRETTAGE/HYSTEROSCOPY WITH RESECTOCOPE N/A 08/11/2013   Procedure: DILATATION & CURETTAGE/HYSTEROSCOPY WITH RESECTOCOPE;  Surgeon: Marvene Staff, MD;  Location: [facility];  Service: Gynecology;  Laterality: N/A;  . TONSILLECTOMY    . TRIGGER FINGER RELEASE     left thumb  . TUBAL LIGATION      There were no vitals filed for this visit.      Subjective Assessment - 08/13/17 1145    Subjective Knee is feeling a little better. Doing exercises, went walking yesterday for about 30 min. Sometimes I think I should ice it but I just don't feel like it.    Currently in Pain? Yes   Pain Score 3    Pain Location Knee   Pain Orientation Left;Medial   Pain Descriptors / Indicators Sore                         [facility] Adult PT Treatment/Exercise - 08/13/17 0001      Knee/Hip Exercises: Stretches   Gastroc Stretch 3 reps;30 seconds   Gastroc  Stretch Limitations slant board     Knee/Hip Exercises: Aerobic   Nustep 7 min L4     Knee/Hip Exercises: Standing   SLS on R toes, flat L foot, eccentric sit ball bw knees; static holds on airex   Other Standing Knee Exercises lateral steps with red band at knees     Knee/Hip Exercises: Seated   Sit to Sand 10 reps  eccentric with band at knees     Modalities   Modalities Ultrasound     Ultrasound   Ultrasound Location L medial knee   Ultrasound Parameters pulsed 1.0 w/cm2 8 min   Ultrasound Goals Pain                  PT Short Term Goals - 07/30/17 1239      PT SHORT TERM GOAL #1   Title Pt will verbalize ability to incorporate stretches into daily activities   Baseline began educating at eval   Time 4   Period Weeks   Status New   Target Date 08/30/17     PT SHORT TERM GOAL #2   Title Gross hip strength to 4/5 bilat   Baseline see flowsheet  Time 4   Period Weeks   Status New   Target Date 08/30/17           PT Long Term Goals - 07/30/17 1240      PT LONG TERM GOAL #1   Title Gross hip strength to 5/5 for proper support to biomechanical chain   Baseline see flowsheet   Time 8   Period Weeks   Status New   Target Date 09/27/17     PT LONG TERM GOAL #2   Title Pt will be able to return to work with pain <=3/10 through the day, manage with proper stretch/rest breaks   Baseline not currently at work   Time 8   Period Weeks   Status New   Target Date 09/27/17     PT LONG TERM GOAL #3   Title Pt will be able to return to walking program for long term exercise   Baseline not walking right now due to pain   Time 8   Period Weeks   Status New   Target Date 09/27/17     PT LONG TERM GOAL #4   Title FOTO to 35% limitation to indicate significant improvement in functional ability   Baseline 49% limitation at eval   Time 8   Period Weeks   Status New   Target Date 09/27/17               Plan - 08/13/17 1153    Clinical  Impression Statement Good tolerance to exercise today, denied increase in pain. Educated on pressure that will arise but exercises should not increase pain. challenged balance and control for stability and safety in partial SLS when she returns to work.    PT Treatment/Interventions ADLs/Self Care Home Management;Cryotherapy;Electrical Stimulation;Iontophoresis 4mg /ml Dexamethasone;Functional mobility training;Stair training;Gait training;Ultrasound;Traction;Moist Heat;Therapeutic activities;Therapeutic exercise;Balance training;Neuromuscular re-education;Patient/family education;Passive range of motion;Manual techniques;Dry needling;Taping   PT Next Visit Plan Re-eval for MD visit, gross hip strengthening.    PT Home Exercise Plan hip flexor, hamstring, gastroc, piriformis stretches; bridge with clam; SLR neutral/ER/abd; stand equally on feet, heel strike; SLS;    Consulted and Agree with Plan of Care Patient      Patient will benefit from skilled therapeutic intervention in order to improve the following deficits and impairments:  Difficulty walking, Increased muscle spasms, Decreased activity tolerance, Decreased endurance, Pain, Improper body mechanics, Impaired flexibility, Decreased balance, Decreased strength, Postural dysfunction  Visit Diagnosis: Acute pain of left knee  Difficulty in walking, not elsewhere classified  Muscle weakness (generalized)     Problem List Patient Active Problem List   Diagnosis Date Noted  . HTN (hypertension) 10/02/2014  . BMI 32.0-32.9,adult 10/02/2014  Kashon Kraynak C. Vandora Jaskulski PT, DPT 08/13/17 12:42 PM   [place] [address] [place], [place], [address] Phone: [phone]   Fax:  [phone]  Name: Mary Holt MRN: [id number] Date of Birth: 1961-04-01

## 2017-08-15 ENCOUNTER — Ambulatory Visit: Payer: 59 | Admitting: Physical Therapy

## 2017-08-15 ENCOUNTER — Encounter: Payer: Self-pay | Admitting: Physical Therapy

## 2017-08-15 DIAGNOSIS — R262 Difficulty in walking, not elsewhere classified: Secondary | ICD-10-CM

## 2017-08-15 DIAGNOSIS — M25562 Pain in left knee: Secondary | ICD-10-CM

## 2017-08-15 DIAGNOSIS — M6281 Muscle weakness (generalized): Secondary | ICD-10-CM

## 2017-08-15 NOTE — Therapy (Signed)
[place], [place], [address] Phone: [phone]   Fax:  [phone]  Physical Therapy Treatment  Patient Details  Name: Mary Holt MRN: [id number] Date of Birth: 1961/05/08 Referring Provider: Mcarthur Rossetti, MD  Encounter Date: 08/15/2017      PT End of Session - 08/15/17 1142    Visit Number 6   Number of Visits 17   Date for PT Re-Evaluation 09/27/17   Authorization Type UHC- 60 visit limit   PT Start Time 1143   PT Stop Time 1222   PT Time Calculation (min) 39 min   Activity Tolerance Patient tolerated treatment well   Behavior During Therapy [facility] for tasks assessed/performed      Past Medical History:  Diagnosis Date  . Heart murmur    asymptomatic  . Hypertension   . Kidney stones   . Kidney stones    several times, last  one 2014  . Mitral valve prolapse   . MVP (mitral valve prolapse)     Past Surgical History:  Procedure Laterality Date  . BACK SURGERY    . BREAST SURGERY     cyst right breast  . DILATATION & CURRETTAGE/HYSTEROSCOPY WITH RESECTOCOPE N/A 08/11/2013   Procedure: DILATATION & CURETTAGE/HYSTEROSCOPY WITH RESECTOCOPE;  Surgeon: Marvene Staff, MD;  Location[facility];  Service: Gynecology;  Laterality: N/A;  . TONSILLECTOMY    . TRIGGER FINGER RELEASE     left thumb  . TUBAL LIGATION      There were no vitals filed for this visit.      Subjective Assessment - 08/15/17 1146    Subjective knee is not doing bad today, just a little medial knee pain.  Did not get any good sleep last night.    Patient Stated Goals return to work, standing            [facility] PT Assessment - 08/15/17 0001      Observation/Other Assessments   Focus on Therapeutic Outcomes (FOTO)  22% limited     Strength   Right Hip Flexion 5/5   Right Hip Extension 5/5   Right Hip ABduction 4+/5   Left Hip Flexion 4+/5   Left Hip Extension 4+/5   Left Hip ABduction 4/5   Left Knee Flexion 5/5   Left Knee Extension 5/5     Palpation   Patella mobility good mobility   Palpation comment [facility]     Ambulation/Gait   Gait Comments no antalgic pattern, L tends to cross over R                     [facility] Adult PT Treatment/Exercise - 08/15/17 0001      Knee/Hip Exercises: Stretches   Gastroc Stretch 2 reps;30 seconds;Both   Gastroc Stretch Limitations slant board   Other Knee/Hip Stretches figure 4- towel pull under thigh     Knee/Hip Exercises: Standing   SLS with reaches for cone taps, half moon   Other Standing Knee Exercises lateral step & return green tband   Other Standing Knee Exercises wide BOS gait with green tband, fwd & retro     Knee/Hip Exercises: Sidelying   Clams 30 ea grean tband                PT Education - 08/15/17 1240    Education provided Yes   Education Details goals & progress, exercise form/rationale   Person(s) Educated Patient   Methods Explanation;Demonstration;Tactile cues;Verbal  cues   Comprehension Verbalized understanding;Returned demonstration;Verbal cues required;Need further instruction;Tactile cues required          PT Short Term Goals - 08/15/17 1148      PT SHORT TERM GOAL #1   Title Pt will verbalize ability to incorporate stretches into daily activities   Baseline I haven't been stretching   Status On-going     PT SHORT TERM GOAL #2   Title Gross hip strength to 4/5 bilat           PT Long Term Goals - 07/30/17 1240      PT LONG TERM GOAL #1   Title Gross hip strength to 5/5 for proper support to biomechanical chain   Baseline see flowsheet   Time 8   Period Weeks   Status New   Target Date 09/27/17     PT LONG TERM GOAL #2   Title Pt will be able to return to work with pain <=3/10 through the day, manage with proper stretch/rest breaks   Baseline not currently at work   Time 8   Period Weeks   Status New   Target Date 09/27/17     PT LONG TERM GOAL #3   Title Pt  will be able to return to walking program for long term exercise   Baseline not walking right now due to pain   Time 8   Period Weeks   Status New   Target Date 09/27/17     PT LONG TERM GOAL #4   Title FOTO to 35% limitation to indicate significant improvement in functional ability   Baseline 49% limitation at eval   Time 8   Period Weeks   Status New   Target Date 09/27/17               Plan - 08/15/17 1145    Clinical Impression Statement Pt has made significant improvement since beginning PT. Cont to have mild TTP at  L pes anserine with scissoring gait of L crossing R. Will continue to benefit from LE biomechanical chain challenges in preparation for return to work.    PT Treatment/Interventions ADLs/Self Care Home Management;Cryotherapy;Electrical Stimulation;Iontophoresis 4mg /ml Dexamethasone;Functional mobility training;Stair training;Gait training;Ultrasound;Traction;Moist Heat;Therapeutic activities;Therapeutic exercise;Balance training;Neuromuscular re-education;Patient/family education;Passive range of motion;Manual techniques;Dry needling;Taping   PT Next Visit Plan gross hip strenthening, balance challenges   PT Home Exercise Plan hip flexor, hamstring, gastroc, piriformis stretches; bridge with clam; SLR neutral/ER/abd; stand equally on feet, heel strike; SLS;    Consulted and Agree with Plan of Care Patient      Patient will benefit from skilled therapeutic intervention in order to improve the following deficits and impairments:  Difficulty walking, Increased muscle spasms, Decreased activity tolerance, Decreased endurance, Pain, Improper body mechanics, Impaired flexibility, Decreased balance, Decreased strength, Postural dysfunction  Visit Diagnosis: Acute pain of left knee  Difficulty in walking, not elsewhere classified  Muscle weakness (generalized)     Problem List Patient Active Problem List   Diagnosis Date Noted  . HTN (hypertension) 10/02/2014   . BMI 32.0-32.9,adult 10/02/2014  Wladyslawa Disbro C. Raylan Hanton PT, DPT 08/15/17 12:41 PM   [place] [address] [place], [place], [address] Phone: [phone]   Fax:  [phone]  Name: LAURELLE SKIVER MRN: [id number] Date of Birth: 1961/09/21

## 2017-08-23 ENCOUNTER — Encounter: Payer: Self-pay | Admitting: Physical Therapy

## 2017-08-23 ENCOUNTER — Ambulatory Visit: Payer: 59 | Admitting: Physical Therapy

## 2017-08-23 DIAGNOSIS — M6281 Muscle weakness (generalized): Secondary | ICD-10-CM

## 2017-08-23 DIAGNOSIS — M25562 Pain in left knee: Secondary | ICD-10-CM

## 2017-08-23 DIAGNOSIS — R262 Difficulty in walking, not elsewhere classified: Secondary | ICD-10-CM

## 2017-08-23 NOTE — Therapy (Signed)
[place], [place], [address] Phone: [phone]   Fax:  [phone]  Physical Therapy Treatment/Discharge Summary  Patient Details  Name: Mary Holt MRN: [id number] Date of Birth: Sep 28, 1961 Referring Provider: Jean Rosenthal MD  Encounter Date: 08/23/2017      PT End of Session - 08/23/17 0800    Visit Number 7   Number of Visits 17   Date for PT Re-Evaluation 09/27/17   Authorization Type [employer]- 60 visit limit   PT Start Time 0800   PT Stop Time 0839   PT Time Calculation (min) 39 min   Activity Tolerance Patient tolerated treatment well   Behavior During Therapy [facility] for tasks assessed/performed      Past Medical History:  Diagnosis Date  . Heart murmur    asymptomatic  . Hypertension   . Kidney stones   . Kidney stones    several times, last  one 2014  . Mitral valve prolapse   . MVP (mitral valve prolapse)     Past Surgical History:  Procedure Laterality Date  . BACK SURGERY    . BREAST SURGERY     cyst right breast  . DILATATION & CURRETTAGE/HYSTEROSCOPY WITH RESECTOCOPE N/A 08/11/2013   Procedure: DILATATION & CURETTAGE/HYSTEROSCOPY WITH RESECTOCOPE;  Surgeon: Marvene Staff, MD;  Location: [facility];  Service: Gynecology;  Laterality: N/A;  . TONSILLECTOMY    . TRIGGER FINGER RELEASE     left thumb  . TUBAL LIGATION      There were no vitals filed for this visit.      Subjective Assessment - 08/23/17 0800    Subjective Knee is not too bad. Medial knee discomfort   Patient Stated Goals return to work, standing   Currently in Pain? Yes   Pain Score 2    Pain Location Knee   Pain Orientation Left;Medial   Pain Descriptors / Indicators Sore   Aggravating Factors  no specific activities noted            [facility] PT Assessment - 08/23/17 0001      Assessment   Medical Diagnosis Left knee pain   Referring Provider Jean Rosenthal MD      Observation/Other Assessments   Focus on Therapeutic Outcomes (FOTO)  11% limited     Strength   Right Hip ABduction 5/5   Left Hip Flexion 5/5   Left Hip Extension 5/5   Left Hip ABduction 4+/5     Ambulation/Gait   Gait Comments pattern [facility]                     [facility] Adult PT Treatment/Exercise - 08/23/17 0001      Knee/Hip Exercises: Stretches   Passive Hamstring Stretch Limitations seated EOB   Hip Flexor Stretch Limitations thomas test position   Gastroc Stretch 30 seconds   Gastroc Stretch Limitations slant board   Other Knee/Hip Stretches figure 4- towel pull under thigh     Knee/Hip Exercises: Aerobic   Nustep 5 min L5     Knee/Hip Exercises: Supine   Bridges with Clamshell 15 reps  green tband   Straight Leg Raises 5 sets;Both   Straight Leg Raise with External Rotation 5 sets;Both     Knee/Hip Exercises: Sidelying   Hip ABduction 20 reps;Both                PT Education - 08/23/17 [phone]    Education provided Yes   Education Details  goals discussion, exercise form/rationale, MMT   Person(s) Educated Patient   Methods Explanation;Demonstration;Tactile cues;Verbal cues   Comprehension Verbalized understanding;Returned demonstration;Tactile cues required;Verbal cues required;Need further instruction          PT Short Term Goals - 08/23/17 0829      PT SHORT TERM GOAL #1   Title Pt will verbalize ability to incorporate stretches into daily activities   Baseline I haven't been stretching   Status Not Met     PT SHORT TERM GOAL #2   Title Gross hip strength to 4/5 bilat   Status Achieved           PT Long Term Goals - 08/23/17 0805      PT LONG TERM GOAL #1   Title Gross hip strength to 5/5 for proper support to biomechanical chain   Baseline see flowsheet   Status Partially Met     PT LONG TERM GOAL #2   Title Pt will be able to return to work with pain <=3/10 through the day, manage with proper stretch/rest breaks    Baseline has not returned but is reportedly able to perform activities required   Status Partially Met     PT LONG TERM GOAL #3   Title Pt will be able to return to walking program for long term exercise   Baseline has been able to walk without increase in knee pain, is working her way to increasing time/distance   Status Achieved     PT LONG TERM GOAL #4   Title FOTO to 35% limitation to indicate significant improvement in functional ability   Baseline 11% limitation   Status Achieved               Plan - 08/23/17 0829    Clinical Impression Statement Pt has made good progress since beginning PT and is ready to be d/c to independent program. Pt denies doing strethes and was encouraged to try to do stretches to avoid regressing as she increases her activity. Pt was encouraged to contact us with any further quetsions.    PT Treatment/Interventions ADLs/Self Care Home Management;Cryotherapy;Electrical Stimulation;Iontophoresis 7m/ml Dexamethasone;Functional mobility training;Stair training;Gait training;Ultrasound;Traction;Moist Heat;Therapeutic activities;Therapeutic exercise;Balance training;Neuromuscular re-education;Patient/family education;Passive range of motion;Manual techniques;Dry needling;Taping   PT Home Exercise Plan hip flexor, hamstring, gastroc, piriformis stretches; bridge with clam; SLR neutral/ER/abd; stand equally on feet, heel strike; SLS;    Consulted and Agree with Plan of Care Patient      Patient will benefit from skilled therapeutic intervention in order to improve the following deficits and impairments:  Difficulty walking, Increased muscle spasms, Decreased activity tolerance, Decreased endurance, Pain, Improper body mechanics, Impaired flexibility, Decreased balance, Decreased strength, Postural dysfunction  Visit Diagnosis: Acute pain of left knee  Difficulty in walking, not elsewhere classified  Muscle weakness (generalized)     Problem  List Patient Active Problem List   Diagnosis Date Noted  . HTN (hypertension) 10/02/2014  . BMI 32.0-32.9,adult 10/02/2014   PHYSICAL THERAPY DISCHARGE SUMMARY  Visits from Start of Care: 7  Current functional level related to goals / functional outcomes: See above   Remaining deficits: See above   Education / Equipment: Anatomy of condition, POC, HEP, exercise form/rationale  Plan: Patient agrees to discharge.  Patient goals were met. Patient is being discharged due to meeting the stated rehab goals.  ?????    Natalye Kott C. Reyhan Moronta PT, DPT 08/23/17 8:40 AM    [facility] [address]  Phone: [phone]   Fax:  [phone]  Name: Mary Holt MRN: [id number] Date of Birth: 1961/09/05

## 2017-08-27 ENCOUNTER — Ambulatory Visit (INDEPENDENT_AMBULATORY_CARE_PROVIDER_SITE_OTHER): Payer: 59 | Admitting: Physician Assistant

## 2017-08-27 DIAGNOSIS — M65331 Trigger finger, right middle finger: Secondary | ICD-10-CM | POA: Diagnosis not present

## 2017-08-27 DIAGNOSIS — M65332 Trigger finger, left middle finger: Secondary | ICD-10-CM

## 2017-08-27 DIAGNOSIS — M1712 Unilateral primary osteoarthritis, left knee: Secondary | ICD-10-CM | POA: Diagnosis not present

## 2017-08-27 MED ORDER — BUPIVACAINE HCL 0.25 % IJ SOLN
0.3300 mL | INTRAMUSCULAR | Status: AC | PRN
  Administered 2017-08-27: .33 mL

## 2017-08-27 MED ORDER — METHYLPREDNISOLONE ACETATE 40 MG/ML IJ SUSP
20.0000 mg | INTRAMUSCULAR | Status: AC | PRN
  Administered 2017-08-27: 20 mg

## 2017-08-27 NOTE — Progress Notes (Signed)
   Procedure Note  Patient: Mary Holt             Date of Birth: 07-01-1961           MRN: [id number]             Visit Date: 08/27/2017 History of present illness Mrs. Rheaume returns today follow-up of her left knee. She states that her knee is much better since: At therapy. Again she had an MRI which showed some arthritis in the knee and the results of this were reviewed with her by Dr. Ninfa Linden over the phone. Her main complaint today is bilateral middle fingers which are triggering. She is requesting injections in these. She's had these in the past and has done well.  Procedures: Visit Diagnoses: Trigger finger, right middle finger  Trigger middle finger of left hand  Unilateral primary osteoarthritis, left knee  Hand/UE Inj Date/Time: 08/27/2017 12:37 PM Performed by: Pete Pelt Authorized by: Pete Pelt   Consent Given by:  Patient Indications:  Pain and therapeutic Condition: trigger finger   Location:  Long finger Site:  R long A1 Needle Size:  25 G Approach:  Volar Ultrasound Guidance: No   Medications:  0.33 mL bupivacaine 0.25 %; 20 mg methylPREDNISolone acetate 40 MG/ML Hand/UE Inj Date/Time: 08/27/2017 12:38 PM Performed by: Pete Pelt Authorized by: Pete Pelt   Consent Given by:  Parent Indications:  Pain and therapeutic Condition: trigger finger   Needle Size:  25 G Approach:  Volar Medications:  0.33 mL bupivacaine 0.25 %; 20 mg methylPREDNISolone acetate 40 MG/ML   Plan she'll follow with [place] on an as-needed basis. We did return her to work on 1918 at her request of full duties without restrictions.

## 2017-08-28 ENCOUNTER — Encounter: Payer: 59 | Admitting: Physical Therapy

## 2017-08-29 ENCOUNTER — Encounter: Payer: 59 | Admitting: Physical Therapy

## 2017-09-03 ENCOUNTER — Ambulatory Visit: Payer: 59 | Admitting: Physical Therapy

## 2017-09-05 ENCOUNTER — Encounter: Payer: 59 | Admitting: Physical Therapy

## 2017-09-30 ENCOUNTER — Ambulatory Visit (INDEPENDENT_AMBULATORY_CARE_PROVIDER_SITE_OTHER): Payer: 59 | Admitting: Orthopaedic Surgery

## 2017-09-30 DIAGNOSIS — G8929 Other chronic pain: Secondary | ICD-10-CM | POA: Insufficient documentation

## 2017-09-30 DIAGNOSIS — M25562 Pain in left knee: Secondary | ICD-10-CM

## 2017-09-30 MED ORDER — METHYLPREDNISOLONE ACETATE 40 MG/ML IJ SUSP
40.0000 mg | INTRAMUSCULAR | Status: AC | PRN
  Administered 2017-09-30: 40 mg via INTRA_ARTICULAR

## 2017-09-30 MED ORDER — LIDOCAINE HCL 1 % IJ SOLN
3.0000 mL | INTRAMUSCULAR | Status: AC | PRN
  Administered 2017-09-30: 3 mL

## 2017-09-30 NOTE — Progress Notes (Signed)
Office Visit Note   Patient: Mary Holt           Date of Birth: 06-Jul-1961           MRN: [id number] Visit Date: 09/30/2017              Requested by: Glendale Chard, [address] [place], [place] [address] PCP: Glendale Chard, MD   Assessment & Plan: Visit Diagnoses:  1. Chronic pain of left knee     Plan: I started about trying a steroid injection in her knee one more time and she is agreeable to this. She is a perfect candidate to try hyaluronic acid for the knee as well. I gave her handout about this and she is agreeable to this order hyaluronic acid for her. Again she's already been to physical therapy for her knee and she'll need to continue her quad exercise strengthening program.  Follow-Up Instructions: Return in about 4 weeks (around 10/28/2017).   Orders:  No orders of the defined types were placed in this encounter.  No orders of the defined types were placed in this encounter.     Procedures: Large Joint Inj Date/Time: 09/30/2017 2:50 PM Performed by: Mcarthur Rossetti Authorized by: Mcarthur Rossetti   Location:  Knee Site:  L knee Ultrasound Guidance: No   Fluoroscopic Guidance: No   Arthrogram: No   Medications:  3 mL lidocaine 1 %; 40 mg methylPREDNISolone acetate 40 MG/ML     Clinical Data: No additional findings.   Subjective: No chief complaint on file. The patient still complains of left knee pain which is been on her feet all day long and she gets a tight feeling in her knee. It hurts behind her knee as well and on the sides. It's hard to get down that knee as well. He does wake her up at night. When she brings her knee up to a flexed position at night she gets a tight feeling in the knee. She's had MRI of his knee earlier this year in July which showed mild arthritic changes in all 3 compartments and some degenerative changes but no full-thickness cartilage loss and no tear of the meniscus of either the medial  lateral side. She's had a steroid injection in the past. She is not had any hyaluronic acid.  HPI  Review of Systems She denies any headache, chest pain, sore breath, fever, chills, nausea, vomiting.  Objective: Vital Signs: LMP 09/30/2014   Physical Exam She is alert and oriented 3 and in no acute distress Ortho Exam Examination of her left knee shows no effusion today. She has painful range of motion globally with range of motion is full. Knee is ligamentously stable. Specialty Comments:  No specialty comments available.  Imaging: No results found. Again the MRI of her left knee in the past showed no full-thickness cartilage loss and no tearing of any of the Arledge struggles orders of the knee.  PMFS History: Patient Active Problem List   Diagnosis Date Noted  . Chronic pain of left knee 09/30/2017  . HTN (hypertension) 10/02/2014  . BMI 32.0-32.9,adult 10/02/2014   Past Medical History:  Diagnosis Date  . Heart murmur    asymptomatic  . Hypertension   . Kidney stones   . Kidney stones    several times, last  one 2014  . Mitral valve prolapse   . MVP (mitral valve prolapse)     Family History  Problem Relation Age of Onset  .  Stroke Mother   . Heart attack Father     Past Surgical History:  Procedure Laterality Date  . BACK SURGERY    . BREAST SURGERY     cyst right breast  . DILATATION & CURRETTAGE/HYSTEROSCOPY WITH RESECTOCOPE N/A 08/11/2013   Procedure: DILATATION & CURETTAGE/HYSTEROSCOPY WITH RESECTOCOPE;  Surgeon: Marvene Staff, MD;  Location: [facility];  Service: Gynecology;  Laterality: N/A;  . TONSILLECTOMY    . TRIGGER FINGER RELEASE     left thumb  . TUBAL LIGATION     Social History   Occupational History  . Not on file.   Social History Main Topics  . Smoking status: Never Smoker  . Smokeless tobacco: Never Used  . Alcohol use No  . Drug use: No  . Sexual activity: Not on file

## 2017-10-28 ENCOUNTER — Encounter (INDEPENDENT_AMBULATORY_CARE_PROVIDER_SITE_OTHER): Payer: Self-pay | Admitting: Orthopaedic Surgery

## 2017-10-28 ENCOUNTER — Ambulatory Visit (INDEPENDENT_AMBULATORY_CARE_PROVIDER_SITE_OTHER): Payer: 59 | Admitting: Orthopaedic Surgery

## 2017-10-28 DIAGNOSIS — M1712 Unilateral primary osteoarthritis, left knee: Secondary | ICD-10-CM | POA: Diagnosis not present

## 2017-10-28 DIAGNOSIS — G8929 Other chronic pain: Secondary | ICD-10-CM

## 2017-10-28 DIAGNOSIS — M25562 Pain in left knee: Principal | ICD-10-CM

## 2017-10-28 MED ORDER — HYALURONAN 88 MG/4ML IX SOSY
88.0000 mg | PREFILLED_SYRINGE | INTRA_ARTICULAR | Status: AC | PRN
  Administered 2017-10-28: 88 mg via INTRA_ARTICULAR

## 2017-10-28 NOTE — Progress Notes (Signed)
   Procedure Note  Patient: Mary Holt             Date of Birth: [birth year]           MRN: [id number]             Visit Date: 10/28/2017  Procedures: Visit Diagnoses: Chronic pain of left knee  Unilateral primary osteoarthritis, left knee  Large Joint Inj: R knee on 10/28/2017 1:46 PM Medications: 88 mg Hyaluronan 88 MG/4ML     [name] is here today for scheduled Monovisc injection in her left knee.  This is to treat mild to moderate arthritic changes and pain in that knee.  She has tried and failed other forms of conservative treatment.  An MRI her left knee shows diffuse thinning of the cartilage throughout the knee with no full-thickness cartilage deficits.  The knee continues well for her and has been uncomfortable especially standing or walking on concrete for long distances.  On examination of her left knee there is mild soft tissue swelling but no significant effusion.  She has global pain with full range of motion.  The knee feels grossly stable.  I talked her about hyaluronic acid again and our rationale behind trying this injection.  She agreed to this as well we placed the Monovisc injection in her left knee without difficulty.  We will see her back in 2 months to see what effect this is had on her.

## 2017-11-13 ENCOUNTER — Telehealth (INDEPENDENT_AMBULATORY_CARE_PROVIDER_SITE_OTHER): Payer: Self-pay | Admitting: Orthopaedic Surgery

## 2017-11-13 NOTE — Telephone Encounter (Signed)
Dr. Blackman patient 

## 2017-11-13 NOTE — Telephone Encounter (Signed)
Mary Holt from [facility] left a message stating that she has questions regarding her Monovisc prescription.  She said she will hold this until Friday and then the process will have to be started all over again.  KR#[phone]

## 2017-12-30 ENCOUNTER — Ambulatory Visit (INDEPENDENT_AMBULATORY_CARE_PROVIDER_SITE_OTHER): Payer: 59 | Admitting: Orthopaedic Surgery

## 2018-02-24 DIAGNOSIS — E782 Mixed hyperlipidemia: Secondary | ICD-10-CM | POA: Diagnosis not present

## 2018-02-24 DIAGNOSIS — J00 Acute nasopharyngitis [common cold]: Secondary | ICD-10-CM | POA: Diagnosis not present

## 2018-02-24 DIAGNOSIS — G4733 Obstructive sleep apnea (adult) (pediatric): Secondary | ICD-10-CM | POA: Diagnosis not present

## 2018-02-24 DIAGNOSIS — I10 Essential (primary) hypertension: Secondary | ICD-10-CM | POA: Diagnosis not present

## 2018-06-30 DIAGNOSIS — R7309 Other abnormal glucose: Secondary | ICD-10-CM | POA: Diagnosis not present

## 2018-06-30 DIAGNOSIS — G4730 Sleep apnea, unspecified: Secondary | ICD-10-CM | POA: Diagnosis not present

## 2018-06-30 DIAGNOSIS — G4733 Obstructive sleep apnea (adult) (pediatric): Secondary | ICD-10-CM | POA: Diagnosis not present

## 2018-06-30 DIAGNOSIS — I10 Essential (primary) hypertension: Secondary | ICD-10-CM | POA: Diagnosis not present

## 2018-10-02 ENCOUNTER — Other Ambulatory Visit: Payer: Self-pay | Admitting: Internal Medicine

## 2018-10-02 DIAGNOSIS — Z1231 Encounter for screening mammogram for malignant neoplasm of breast: Secondary | ICD-10-CM

## 2018-10-13 ENCOUNTER — Ambulatory Visit
Admission: RE | Admit: 2018-10-13 | Discharge: 2018-10-13 | Disposition: A | Discharge status: Home / Self Care | Payer: 59 | Source: Ambulatory Visit | Attending: Internal Medicine | Admitting: Internal Medicine

## 2018-10-13 DIAGNOSIS — Z1231 Encounter for screening mammogram for malignant neoplasm of breast: Secondary | ICD-10-CM

## 2019-01-06 ENCOUNTER — Other Ambulatory Visit: Payer: Self-pay | Admitting: Nurse Practitioner

## 2019-01-06 ENCOUNTER — Other Ambulatory Visit: Payer: Self-pay

## 2019-01-06 MED ORDER — OLMESARTAN MEDOXOMIL-HCTZ 40-12.5 MG PO TABS: 1.0000 | ORAL_TABLET | Freq: Every day | ORAL | 0 refills | Status: DC

## 2019-02-02 ENCOUNTER — Other Ambulatory Visit (HOSPITAL_COMMUNITY)
Admission: RE | Admit: 2019-02-02 | Discharge: 2019-02-02 | Disposition: A | Discharge status: Home / Self Care | Payer: 59 | Source: Ambulatory Visit | Attending: Nurse Practitioner | Admitting: Nurse Practitioner

## 2019-02-02 ENCOUNTER — Encounter: Payer: Self-pay | Admitting: Nurse Practitioner

## 2019-02-02 ENCOUNTER — Ambulatory Visit (INDEPENDENT_AMBULATORY_CARE_PROVIDER_SITE_OTHER): Payer: 59 | Admitting: Nurse Practitioner

## 2019-02-02 VITALS — BP 132/80 | HR 95 | Temp 98.8°F | Ht 63.25 in | Wt 183.0 lb

## 2019-02-02 DIAGNOSIS — I10 Essential (primary) hypertension: Secondary | ICD-10-CM | POA: Diagnosis not present

## 2019-02-02 DIAGNOSIS — Z113 Encounter for screening for infections with a predominantly sexual mode of transmission: Secondary | ICD-10-CM | POA: Insufficient documentation

## 2019-02-02 DIAGNOSIS — Z0000 Encounter for general adult medical examination without abnormal findings: Secondary | ICD-10-CM

## 2019-02-02 DIAGNOSIS — Z124 Encounter for screening for malignant neoplasm of cervix: Secondary | ICD-10-CM | POA: Diagnosis not present

## 2019-02-02 DIAGNOSIS — R4589 Other symptoms and signs involving emotional state: Secondary | ICD-10-CM

## 2019-02-02 DIAGNOSIS — Z1159 Encounter for screening for other viral diseases: Secondary | ICD-10-CM

## 2019-02-02 DIAGNOSIS — L659 Nonscarring hair loss, unspecified: Secondary | ICD-10-CM | POA: Diagnosis not present

## 2019-02-02 DIAGNOSIS — R7303 Prediabetes: Secondary | ICD-10-CM

## 2019-02-02 DIAGNOSIS — Z1211 Encounter for screening for malignant neoplasm of colon: Secondary | ICD-10-CM

## 2019-02-02 DIAGNOSIS — Z139 Encounter for screening, unspecified: Secondary | ICD-10-CM | POA: Diagnosis not present

## 2019-02-02 DIAGNOSIS — R21 Rash and other nonspecific skin eruption: Secondary | ICD-10-CM | POA: Insufficient documentation

## 2019-02-02 LAB — POCT URINALYSIS DIPSTICK
Bilirubin, UA: NEGATIVE
GLUCOSE UA: NEGATIVE
KETONES UA: NEGATIVE
Leukocytes, UA: NEGATIVE
Nitrite, UA: NEGATIVE
Protein, UA: NEGATIVE
Spec Grav, UA: 1.02 (ref 1.010–1.025)
Urobilinogen, UA: 0.2 E.U./dL
pH, UA: 6 (ref 5.0–8.0)

## 2019-02-02 LAB — POCT UA - MICROALBUMIN
Albumin/Creatinine Ratio, Urine, POC: 30
Creatinine, POC: 200 mg/dL
Microalbumin Ur, POC: 10 mg/L

## 2019-02-02 MED ORDER — MOMETASONE FUROATE 0.1 % EX CREA: TOPICAL_CREAM | CUTANEOUS | 1 refills | Status: AC

## 2019-02-02 MED ORDER — OLMESARTAN MEDOXOMIL 40 MG PO TABS: 40.0000 mg | ORAL_TABLET | Freq: Every day | ORAL | 1 refills | Status: DC

## 2019-02-02 NOTE — Patient Instructions (Signed)

## 2019-02-02 NOTE — Progress Notes (Addendum)
Subjective:     Patient ID: MLISS WEDIN , female    DOB: February 03, 1961 , 58 y.o.   MRN: [id number]   Chief Complaint  Patient presents with  . Annual Exam   The patient states she uses none for birth control. Last LMP was Patient's last menstrual period was 09/30/2014..Negative for Dysmenorrhea and Negative for Menorrhagia Mammogram last done 10/13/2018.  Negative for: breast discharge, breast lump(s), breast pain and breast self exam.  Pertinent negatives include abnormal bleeding (hematology), anxiety, decreased libido, depression, difficulty falling sleep, dyspareunia, history of infertility, nocturia, sexual dysfunction, sleep disturbances, urinary incontinence, urinary urgency, vaginal discharge and vaginal itching. Diet regular.  The patient states her exercise level is   walking 3 days a week, 30 minutes at a time   The patient's tobacco use is:   Social History   Tobacco Use  Smoking Status Never Smoker  Smokeless Tobacco Never Used   She has been exposed to passive smoke. The patient's alcohol use is:  Social History   Substance and Sexual Activity  Alcohol Use No   Additional information: Last pap more than 3 years ago, next one scheduled for today   HPI  Here for HM  Hypertension  This is a chronic problem. The current episode started more than 1 year ago. The problem is unchanged. The problem is controlled. Pertinent negatives include no anxiety, blurred vision, chest pain, headaches or palpitations. There are no associated agents to hypertension. Past treatments include nothing.     Past Medical History:  Diagnosis Date  . Heart murmur    asymptomatic  . Hypertension   . Kidney stones   . Kidney stones    several times, last  one 2014  . Mitral valve prolapse   . MVP (mitral valve prolapse)      Family History  Problem Relation Age of Onset  . Stroke Mother   . Heart attack Father      Current Outpatient Medications:  .   olmesartan-hydrochlorothiazide (BENICAR HCT) 40-12.5 MG tablet, Take 1 tablet by mouth daily. Patient needs an appointment, Disp: 30 tablet, Rfl: 0   Allergies  Allergen Reactions  . Nabumetone Rash  . Penicillins Rash     Review of Systems  Constitutional: Negative.  Negative for fatigue.  HENT: Negative.   Eyes: Negative.  Negative for blurred vision.  Respiratory: Negative.   Cardiovascular: Negative.  Negative for chest pain and palpitations.  Gastrointestinal: Negative.   Endocrine: Negative.   Genitourinary: Negative.   Musculoskeletal: Negative.   Skin: Negative.        Hair thinning - began in her 30's  Allergic/Immunologic: Negative.   Neurological: Negative.  Negative for dizziness and headaches.  Hematological: Negative.   Psychiatric/Behavioral: Positive for suicidal ideas (she does not feel like harming herself "just unhappy at times").     Today's Vitals   02/02/19 1441  BP: 132/80  Pulse: 95  Temp: 98.8 F (37.1 C)  TempSrc: Oral  Weight: 183 lb (83 kg)  Height: 5' 3.25" (1.607 m)  PainSc: 0-No pain   Body mass index is 32.16 kg/m.   Objective:  Physical Exam Vitals signs reviewed.  Constitutional:      Appearance: Normal appearance. She is well-developed.  HENT:     Head: Normocephalic and atraumatic.     Right Ear: Hearing, tympanic membrane, ear canal and external ear normal.     Left Ear: Hearing, tympanic membrane, ear canal and external ear normal.  Nose: Nose normal.     Mouth/Throat:     Mouth: Mucous membranes are moist.  Eyes:     General: Lids are normal.     Conjunctiva/sclera: Conjunctivae normal.     Pupils: Pupils are equal, round, and reactive to light.     Funduscopic exam:    Right eye: No papilledema.        Left eye: No papilledema.  Neck:     Musculoskeletal: Full passive range of motion without pain, normal range of motion and neck supple.     Thyroid: No thyroid mass.     Vascular: No carotid bruit.   Cardiovascular:     Rate and Rhythm: Normal rate and regular rhythm.     Pulses: Normal pulses.     Heart sounds: Normal heart sounds. No murmur.  Pulmonary:     Effort: Pulmonary effort is normal.     Breath sounds: Normal breath sounds.  Abdominal:     General: Abdomen is flat. Bowel sounds are normal.     Palpations: Abdomen is soft.  Genitourinary:    Pubic Area: No rash.      Vagina: Normal.     Cervix: Normal.     Uterus: Normal.      Adnexa: Right adnexa normal and left adnexa normal.  Musculoskeletal: Normal range of motion.        General: No swelling.     Right lower leg: No edema.     Left lower leg: No edema.  Skin:    General: Skin is warm and dry.     Capillary Refill: Capillary refill takes less than 2 seconds.  Neurological:     General: No focal deficit present.     Mental Status: She is alert and oriented to person, place, and time.     Cranial Nerves: No cranial nerve deficit.     Sensory: No sensory deficit.  Psychiatric:        Mood and Affect: Mood normal.        Behavior: Behavior normal.        Thought Content: Thought content normal.        Judgment: Judgment normal.         Assessment And Plan:        1. Health maintenance examination . Behavior modifications discussed and diet history reviewed.   . Pt will continue to exercise regularly and modify diet with low GI, plant based foods and decrease intake of processed foods.  . Recommend intake of daily multivitamin, Vitamin D, and calcium.  . Recommend mammogram and colonoscopy for preventive screenings, as well as recommend immunizations that include influenza, TDAP, and Shingles   2. Encounter for screening colonoscopy  According to USPTF Colorectal cancer Screening guidelines. Colonoscopy is recommended every 10 years, starting at age 82years.  Will refer to GI for colon cancer screening. - Ambulatory referral to Gastroenterology  3. Encounter for hepatitis C screening test for low  risk patient  Will check for Hepatitis C screening due to being born between the years 56-1966  - Hepatitis C antibody  4. Encounter for screening  - HIV antibody (with reflex)  5. Encounter for Papanicolaou smear of cervix  No abnormal findings on physical exam - Cytology -Pap Smear  6. Screening for STD (sexually transmitted disease)  - Cervicovaginal ancillary only  7. Rash  This is for her chronic dermatitis, had been receiving at the dermatologist  No rash present at current time - mometasone (ELOCON) 0.1 %  cream; Apply to affected area daily  Dispense: 45 g; Refill: 1  8. Loss of hair  She has thinning hair, would like a referral to dermatology for further evaluation - Ambulatory referral to Dermatology - TSH  9. Prediabetes  Chronic, fair control  Continue with current medications  Encouraged to limit intake of sugary foods and drinks  Encouraged to increase physical activity to 150 minutes per week - Hemoglobin A1c  10. Essential hypertension  Changed BP med to remove HCTZ due to hair loss  Will try on olmesartan 40mg  daily - EKG 12-Lead - POCT Urinalysis Dipstick (81002) - POCT UA - Microalbumin - Lipid panel - CMP14 + Anion Gap - olmesartan (BENICAR) 40 MG tablet; Take 1 tablet (40 mg total) by mouth daily.  Dispense: 90 tablet; Refill: 1   11. Sadness  Reported suicidal ideations but not wanting to harm herself just feeling "unhappy", does not have a plan.  Offered to refer for counseling and declines at this time  [name], FNP

## 2019-02-03 LAB — CMP14 + ANION GAP
ALK PHOS: 106 IU/L (ref 39–117)
ALT: 27 IU/L (ref 0–32)
AST: 19 IU/L (ref 0–40)
Albumin/Globulin Ratio: 1.6 (ref 1.2–2.2)
Albumin: 4.8 g/dL (ref 3.8–4.9)
Anion Gap: 19 mmol/L — ABNORMAL HIGH (ref 10.0–18.0)
BUN/Creatinine Ratio: 19 (ref 9–23)
BUN: 16 mg/dL (ref 6–24)
Bilirubin Total: <0.2 mg/dL (ref 0.0–1.2)
CO2: 24 mmol/L (ref 20–29)
Calcium: 9.7 mg/dL (ref 8.7–10.2)
Chloride: 97 mmol/L (ref 96–106)
Creatinine, Ser: 0.86 mg/dL (ref 0.57–1.00)
GFR calc Af Amer: 86 mL/min/{1.73_m2} (ref >59)
GFR calc non Af Amer: 75 mL/min/{1.73_m2} (ref >59)
Globulin, Total: 3 g/dL (ref 1.5–4.5)
Glucose: 112 mg/dL — ABNORMAL HIGH (ref 65–99)
Potassium: 3.8 mmol/L (ref 3.5–5.2)
Sodium: 140 mmol/L (ref 134–144)
Total Protein: 7.8 g/dL (ref 6.0–8.5)

## 2019-02-03 LAB — HEMOGLOBIN A1C
ESTIMATED AVERAGE GLUCOSE: 157 mg/dL
Hgb A1c MFr Bld: 7.1 % — ABNORMAL HIGH (ref 4.8–5.6)

## 2019-02-03 LAB — LIPID PANEL
Chol/HDL Ratio: 5.1 ratio — ABNORMAL HIGH (ref 0.0–4.4)
Cholesterol, Total: 277 mg/dL — ABNORMAL HIGH (ref 100–199)
HDL: 54 mg/dL (ref >39)
LDL CALC: 186 mg/dL — AB (ref 0–99)
Triglycerides: 184 mg/dL — ABNORMAL HIGH (ref 0–149)
VLDL Cholesterol Cal: 37 mg/dL (ref 5–40)

## 2019-02-03 LAB — HIV ANTIBODY (ROUTINE TESTING W REFLEX): HIV Screen 4th Generation wRfx: NONREACTIVE

## 2019-02-03 LAB — HEPATITIS C ANTIBODY: Hep C Virus Ab: <0.1 s/co ratio (ref 0.0–0.9)

## 2019-02-03 LAB — TSH: TSH: 0.969 u[IU]/mL (ref 0.450–4.500)

## 2019-02-04 ENCOUNTER — Other Ambulatory Visit: Payer: Self-pay | Admitting: Nurse Practitioner

## 2019-02-04 DIAGNOSIS — E119 Type 2 diabetes mellitus without complications: Secondary | ICD-10-CM

## 2019-02-04 LAB — CYTOLOGY - PAP
Adequacy: ABSENT
Diagnosis: NEGATIVE

## 2019-02-04 MED ORDER — METFORMIN HCL 500 MG PO TABS: 500.0000 mg | ORAL_TABLET | Freq: Two times a day (BID) | ORAL | 11 refills | Status: DC

## 2019-02-05 LAB — CERVICOVAGINAL ANCILLARY ONLY
Bacterial vaginitis: POSITIVE — AB
Chlamydia: NEGATIVE
Neisseria Gonorrhea: NEGATIVE
Trichomonas: NEGATIVE

## 2019-02-06 ENCOUNTER — Other Ambulatory Visit: Payer: Self-pay | Admitting: Nurse Practitioner

## 2019-02-06 ENCOUNTER — Telehealth: Payer: Self-pay | Admitting: Nurse Practitioner

## 2019-02-06 DIAGNOSIS — B9689 Other specified bacterial agents as the cause of diseases classified elsewhere: Secondary | ICD-10-CM

## 2019-02-06 DIAGNOSIS — N760 Acute vaginitis: Secondary | ICD-10-CM

## 2019-02-06 MED ORDER — METRONIDAZOLE 500 MG PO TABS: 500.0000 mg | ORAL_TABLET | Freq: Three times a day (TID) | ORAL | 0 refills | Status: DC

## 2019-02-06 NOTE — Telephone Encounter (Signed)
See lab note, left a message to return call for her lab results.

## 2019-02-10 ENCOUNTER — Other Ambulatory Visit: Payer: Self-pay | Admitting: Nurse Practitioner

## 2019-02-10 MED ORDER — SIMVASTATIN 10 MG PO TABS: 10.0000 mg | ORAL_TABLET | Freq: Every evening | ORAL | 2 refills | Status: DC

## 2019-02-10 NOTE — Progress Notes (Signed)
I am sending her a Rx for simvastatin 10 mg at bedtime to help decrease her cholesterol. She needs to make sure to stay well hydrated and can take the vitamin CoQ10 daily to help with potential muscle aches. She will need to a follow up in 8 weeks to see how the medication is working.

## 2019-02-11 IMAGING — MR MR KNEE*L* W/O CM
4 of 6 series · 19 of 40 positions shown · non-contrast
Comparison: Left knee radiographs 07/01/2017

CLINICAL DATA: Acute pain in the back of the left knee with
swelling and popping. Concern for stress fracture.

EXAM:
MRI OF THE LEFT KNEE WITHOUT CONTRAST
TECHNIQUE: Multiplanar, multisequence MR imaging of the knee was performed. No
intravenous contrast was administered.

[Series 4: PD fat-sat · axial · 3.5mm · 0.31mm/px · z∈[-100,+22]mm · 7 of 30 slices shown (1 of 4)]
[im 1/30]
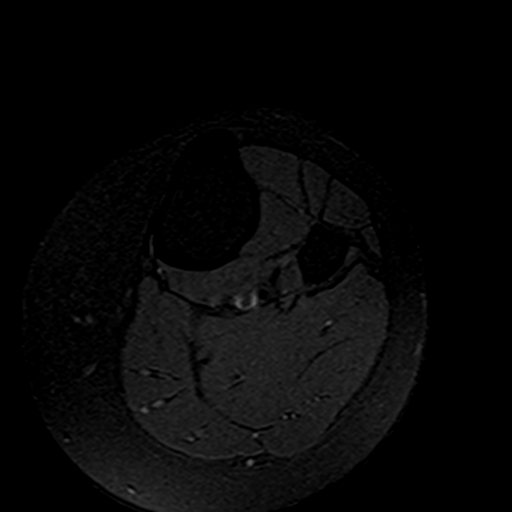
[im 5/30]
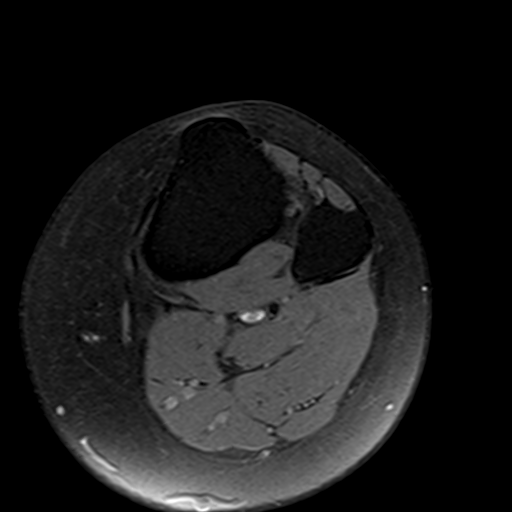
[im 10/30]
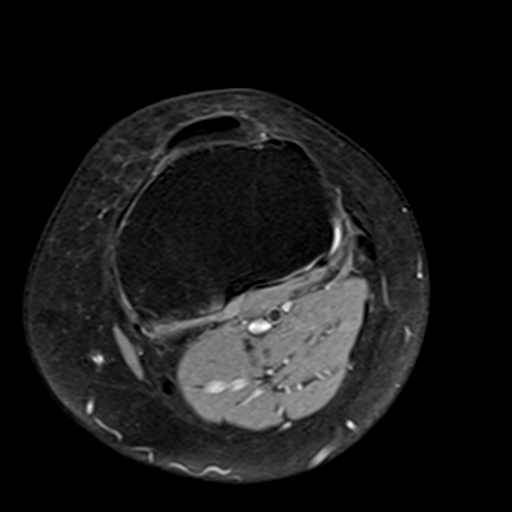
[im 15/30]
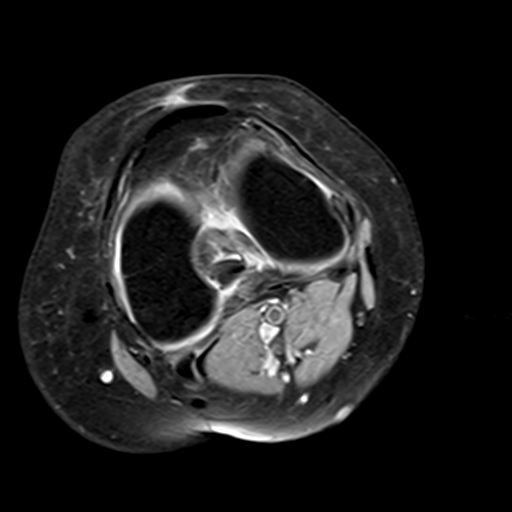
[im 20/30]
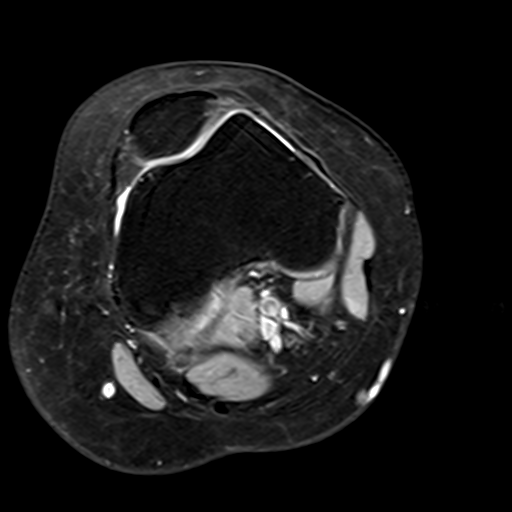
[im 25/30]
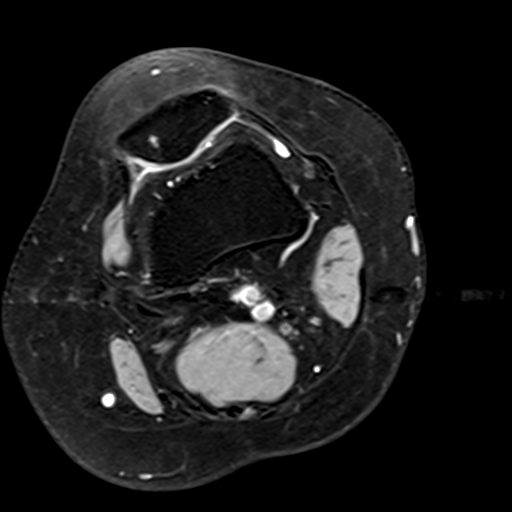
[im 30/30]
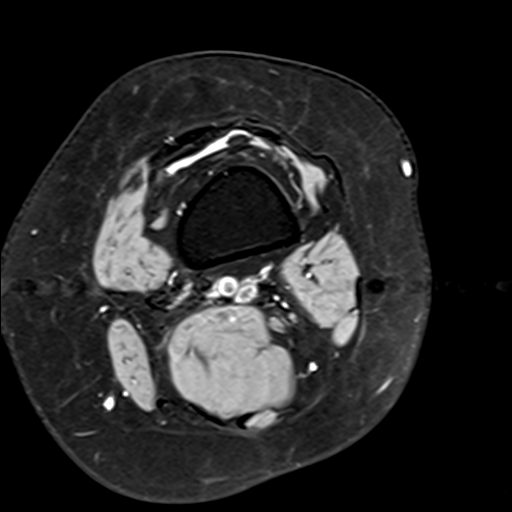

[Series 8: PD fat-sat · coronal · 3.5mm · 0.29mm/px · 6 of 25 slices shown (2 of 4)]
[im 1/25]
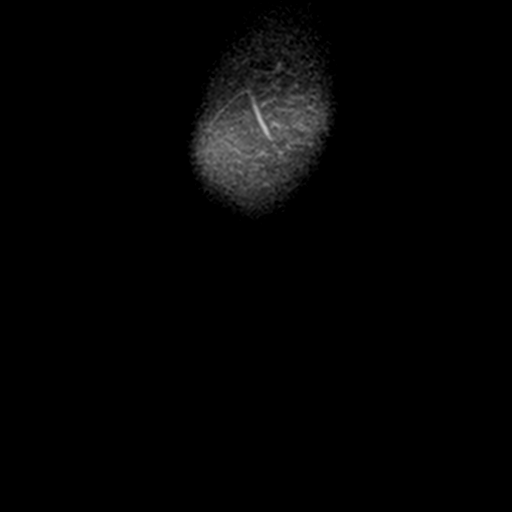
[im 5/25]
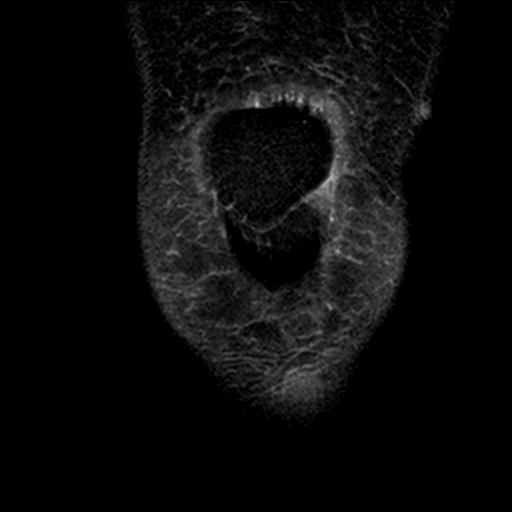
[im 9/25]
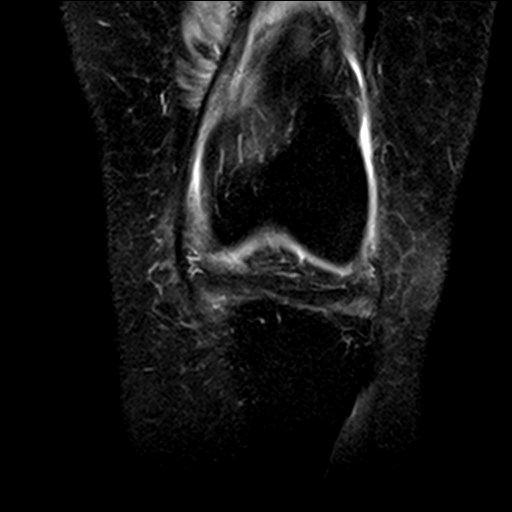
[im 13/25]
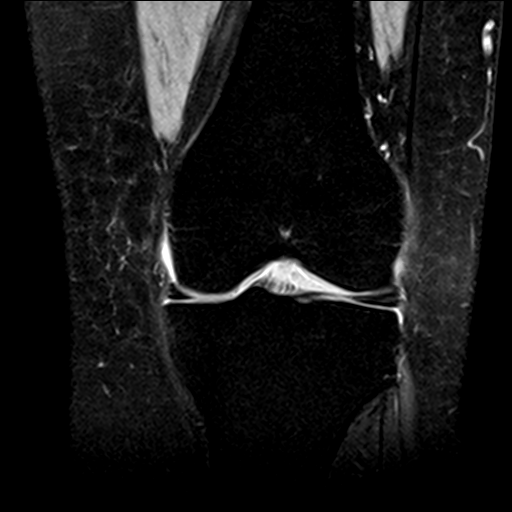
[im 17/25]
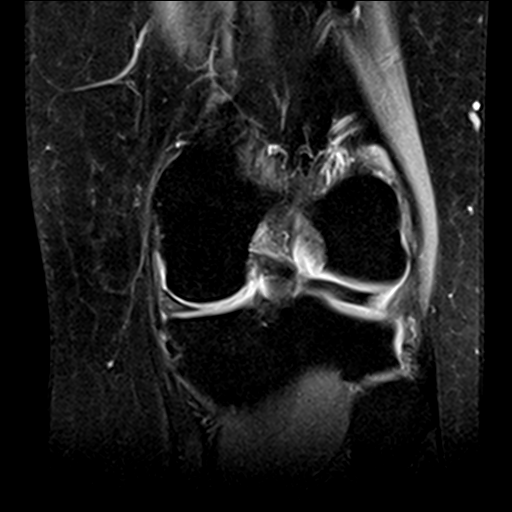
[im 21/25]
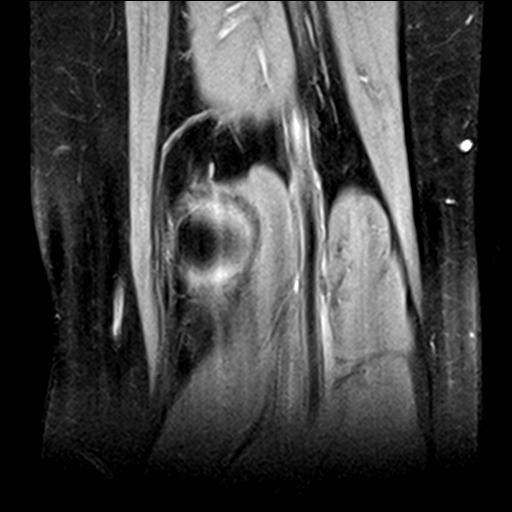

[Series 10: PD fat-sat · sagittal · 3.2mm · 0.29mm/px · 3 of 28 slices shown (3 of 4)]
[im 4/28]
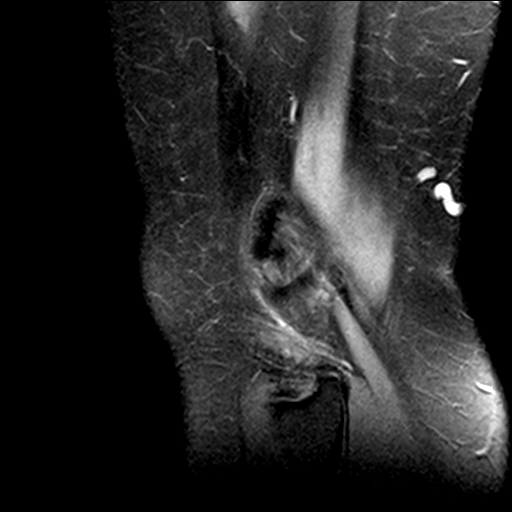
[im 16/28]
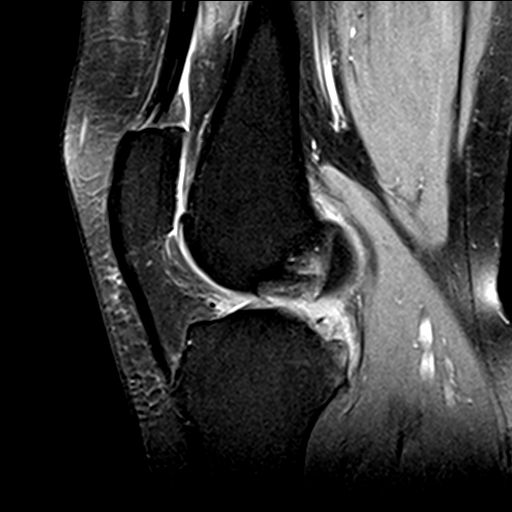
[im 24/28]
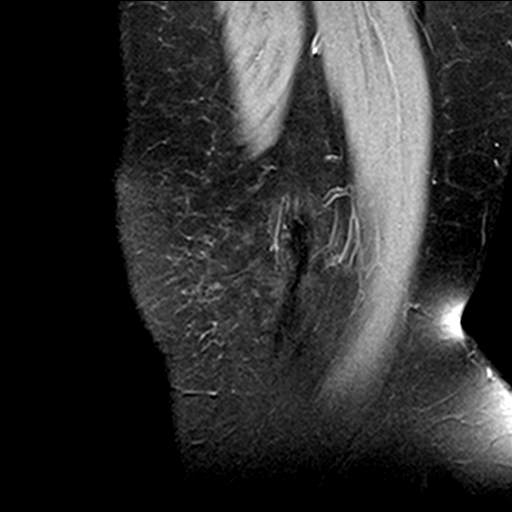

[Series 11: PD fat-sat · oblique · 2.0mm · 0.29mm/px · 3 of 16 slices shown (4 of 4)]
[im 1/16]
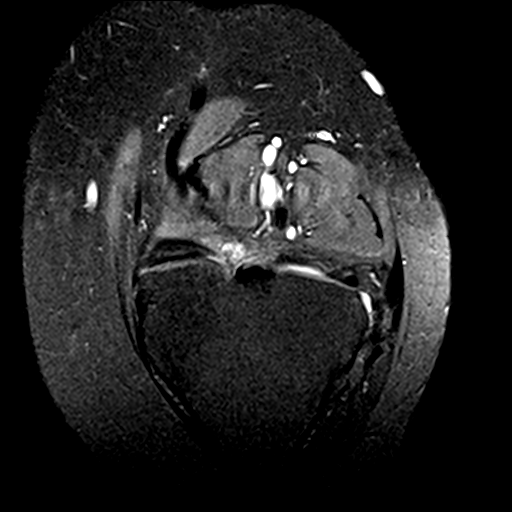
[im 11/16]
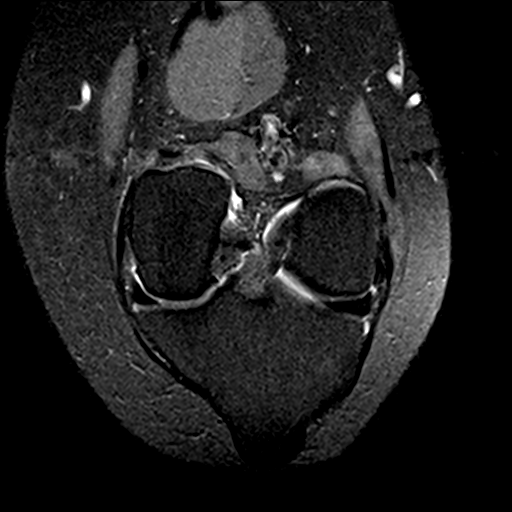
[im 16/16]
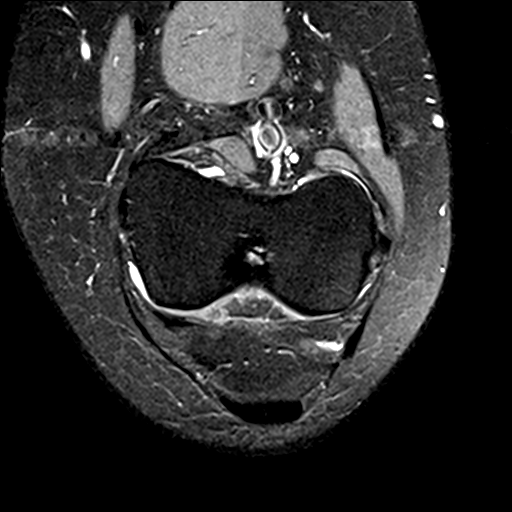

[19 of 40 positions shown; findings below may reference images not displayed]

FINDINGS: LEFT KNEE

MENISCI

Medial meniscus: Intrasubstance signal consistent with degenerative
change. No discrete tear identified.

Lateral meniscus: Intrasubstance signal consistent with degenerative
change. No discrete tear identified.

LIGAMENTS

Cruciates: Diffuse thinning and increased signal intensity within
the anterior cruciate ligament consistent with strain or
degeneration. Fibers appear intact. Posterior cruciate ligament is
unremarkable.

Collaterals: Medial collateral ligament is intact. Lateral
collateral ligament complex is intact.

CARTILAGE

Patellofemoral: Diffuse thinning of patellofemoral cartilage with
focal osteochondral defect along the medial articular surface.

Medial: Diffuse cartilaginous thinning without osteochondral
changes.

Lateral: Mild diffuse cartilaginous thinning without osteochondral
changes.

Joint:  A small joint effusion is present.

Popliteal Fossa:  No Baker cyst. Intact popliteus tendon.

Extensor Mechanism:  Intact quadriceps tendon and patellar tendon.

Bones: No focal marrow signal abnormality. No fracture or
dislocation.

Other: No evidence of bone contusion or stress reaction.
IMPRESSION: 1. Degenerative changes in the left knee as described.
2. Strain versus degenerative change in the anterior cruciate
ligament without disruption.
3. Chondromalacia patella.
4. Small left knee effusion.

## 2019-02-13 ENCOUNTER — Emergency Department (HOSPITAL_COMMUNITY)
Admission: EM | Admit: 2019-02-13 | Discharge: 2019-02-14 | Disposition: A | Discharge status: Home / Self Care | Payer: 59 | Attending: Emergency Medicine | Admitting: Emergency Medicine

## 2019-02-13 ENCOUNTER — Emergency Department (HOSPITAL_COMMUNITY): Payer: 59

## 2019-02-13 ENCOUNTER — Other Ambulatory Visit: Payer: Self-pay

## 2019-02-13 ENCOUNTER — Encounter (HOSPITAL_COMMUNITY): Payer: Self-pay | Admitting: *Deleted

## 2019-02-13 DIAGNOSIS — Z7984 Long term (current) use of oral hypoglycemic drugs: Secondary | ICD-10-CM | POA: Diagnosis not present

## 2019-02-13 DIAGNOSIS — E119 Type 2 diabetes mellitus without complications: Secondary | ICD-10-CM | POA: Diagnosis not present

## 2019-02-13 DIAGNOSIS — Z79899 Other long term (current) drug therapy: Secondary | ICD-10-CM | POA: Diagnosis not present

## 2019-02-13 DIAGNOSIS — R079 Chest pain, unspecified: Secondary | ICD-10-CM | POA: Diagnosis not present

## 2019-02-13 DIAGNOSIS — R0789 Other chest pain: Secondary | ICD-10-CM | POA: Diagnosis not present

## 2019-02-13 DIAGNOSIS — I10 Essential (primary) hypertension: Secondary | ICD-10-CM | POA: Diagnosis not present

## 2019-02-13 LAB — BASIC METABOLIC PANEL
Anion gap: 11 (ref 5–15)
BUN: 14 mg/dL (ref 6–20)
CALCIUM: 9.5 mg/dL (ref 8.9–10.3)
CO2: 23 mmol/L (ref 22–32)
CREATININE: 0.9 mg/dL (ref 0.44–1.00)
Chloride: 106 mmol/L (ref 98–111)
GFR calc Af Amer: >60 mL/min (ref >60)
GFR calc non Af Amer: >60 mL/min (ref >60)
Glucose, Bld: 180 mg/dL — ABNORMAL HIGH (ref 70–99)
Potassium: 3.9 mmol/L (ref 3.5–5.1)
Sodium: 140 mmol/L (ref 135–145)

## 2019-02-13 LAB — CBC
HCT: 42.2 % (ref 36.0–46.0)
Hemoglobin: 13.2 g/dL (ref 12.0–15.0)
MCH: 27.9 pg (ref 26.0–34.0)
MCHC: 31.3 g/dL (ref 30.0–36.0)
MCV: 89.2 fL (ref 80.0–100.0)
PLATELETS: 320 10*3/uL (ref 150–400)
RBC: 4.73 MIL/uL (ref 3.87–5.11)
RDW: 12.9 % (ref 11.5–15.5)
WBC: 9.2 10*3/uL (ref 4.0–10.5)
nRBC: 0 % (ref 0.0–0.2)

## 2019-02-13 LAB — I-STAT TROPONIN, ED: Troponin i, poc: 0 ng/mL (ref 0.00–0.08)

## 2019-02-13 LAB — I-STAT BETA HCG BLOOD, ED (MC, WL, AP ONLY): I-stat hCG, quantitative: <5 m[IU]/mL (ref <5)

## 2019-02-13 MED ORDER — SODIUM CHLORIDE 0.9% FLUSH: 3.0000 mL | Freq: Once | INTRAVENOUS | Status: DC

## 2019-02-13 NOTE — ED Triage Notes (Addendum)
Pt has hx of spinal fusion and reports chronic back pain. Has been having chest discomfort that has progressed for the past several days. Reports with her job she has to lean down and back up often which worsens the discomfort. Also has recently been started on antibiotics for UTI and several antihypertensive medicaitons

## 2019-02-13 NOTE — ED Provider Notes (Signed)
[facility] EMERGENCY DEPARTMENT Provider Note   CSN: [id number] Arrival date & time: 02/13/19  2000    History   Chief Complaint Chief Complaint  Patient presents with  . Chest Pain  . Back Pain    HPI Mary Holt is a 58 y.o. female who presents to the Ed with cc of chest and back pain. The patient states that she has Pain in her upper back.  That radiates around the front of her chest.  It feels tight or squeezing.  Is worse when she leans down.  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  She is constantly holding material moving close off the clothing rack and using her arms to steam the clothing.  Patient states that these symptoms occur randomly.  She does not have any exertional chest pain, shortness of breath.  She has no associated nausea, diaphoresis, pain in her jaw shoulder arms.  She denies.  Paresthesias. Patient states that the symptoms have been going on for months.  She feels like they have increased in frequency.  She denies history of PE or DVT, unilateral leg swelling, exogenous estrogen use.  She does not smoke.     HPI  Past Medical History:  Diagnosis Date  . Heart murmur    asymptomatic  . Hypertension   . Kidney stones   . Kidney stones    several times, last  one 2014  . Mitral valve prolapse   . MVP (mitral valve prolapse)     Patient Active Problem List   Diagnosis Date Noted  . Rash 02/02/2019  . Loss of hair 02/02/2019  . Prediabetes 02/02/2019  . Unilateral primary osteoarthritis, left knee 10/28/2017  . Chronic pain of left knee 09/30/2017  . HTN (hypertension) 10/02/2014  . BMI 32.0-32.9,adult 10/02/2014    Past Surgical History:  Procedure Laterality Date  . BACK SURGERY    . BREAST SURGERY     cyst right breast  . DILATATION & CURRETTAGE/HYSTEROSCOPY WITH RESECTOCOPE N/A 08/11/2013   Procedure: DILATATION & CURETTAGE/HYSTEROSCOPY WITH RESECTOCOPE;  Surgeon: Marvene Staff, MD;   Location: [facility];  Service: Gynecology;  Laterality: N/A;  . TONSILLECTOMY    . TRIGGER FINGER RELEASE     left thumb  . TUBAL LIGATION       OB History   No obstetric history on file.      Home Medications    Prior to Admission medications   Medication Sig Start Date End Date Taking? Authorizing Provider  metFORMIN (GLUCOPHAGE) 500 MG tablet Take 1 tablet (500 mg total) by mouth 2 (two) times daily with a meal. 02/04/19 02/04/20  [name], FNP  metroNIDAZOLE (FLAGYL) 500 MG tablet Take 1 tablet (500 mg total) by mouth 3 (three) times daily. 02/06/19   [name], FNP  mometasone (ELOCON) 0.1 % cream Apply to affected area daily 02/02/19 02/02/20  [name], FNP  olmesartan (BENICAR) 40 MG tablet Take 1 tablet (40 mg total) by mouth daily. 02/02/19 02/02/20  [name], FNP  simvastatin (ZOCOR) 10 MG tablet Take 1 tablet (10 mg total) by mouth every evening. 02/10/19 02/10/20  [name], FNP    Family History Family History  Problem Relation Age of Onset  . Stroke Mother   . Heart attack Father     Social History Social History   Tobacco Use  . Smoking status: Never Smoker  . Smokeless tobacco: Never Used  Substance Use Topics  . Alcohol  use: No  . Drug use: No     Allergies   Nabumetone and Penicillins   Review of Systems Review of Systems  Ten systems reviewed and are negative for acute change, except as noted in the HPI.   Physical Exam Updated Vital Signs BP (!) 161/95   Pulse (!) 127   Temp 99.8 F (37.7 C)   Resp 18   LMP 09/30/2014   SpO2 96%   Physical Exam Vitals signs and nursing note reviewed.  Constitutional:      General: She is not in acute distress.    Appearance: She is well-developed. She is not diaphoretic.  HENT:     Head: Normocephalic and atraumatic.  Eyes:     General: No scleral icterus.    Conjunctiva/sclera: Conjunctivae normal.  Neck:     Musculoskeletal: Normal range of motion.  Cardiovascular:     Rate and  Rhythm: Regular rhythm. Tachycardia present.     Heart sounds: Normal heart sounds. No murmur. No friction rub. No gallop.   Pulmonary:     Effort: Pulmonary effort is normal. No tachypnea or respiratory distress.     Breath sounds: Normal breath sounds.  Chest:     Chest wall: No tenderness.  Abdominal:     General: Bowel sounds are normal. There is no distension.     Palpations: Abdomen is soft. There is no mass.     Tenderness: There is no abdominal tenderness. There is no guarding.  Skin:    General: Skin is warm and dry.  Neurological:     Mental Status: She is alert and oriented to person, place, and time.  Psychiatric:        Behavior: Behavior normal.      ED Treatments / Results  Labs (all labs ordered are listed, but only abnormal results are displayed) Labs Reviewed  BASIC METABOLIC PANEL - Abnormal; Notable for the following components:      Result Value   Glucose, Bld 180 (*)    All other components within normal limits  CBC  TSH  I-STAT TROPONIN, ED  I-STAT BETA HCG BLOOD, ED ([facility], [facility], [facility] ONLY)  I-STAT TROPONIN, ED    EKG EKG Interpretation  Date/Time:  Friday February 13 2019 20:05:03 EST Ventricular Rate:  128 PR Interval:  142 QRS Duration: 72 QT Interval:  304 QTC Calculation: 443 R Axis:   65 Text Interpretation:  Sinus tachycardia Otherwise normal ECG faster HR than previous. improved U waves compared to 2017 Confirmed by Merrily Pew [phone]) on 02/13/2019 11:07:10 PM   Radiology Dg Chest 2 View  Result Date: 02/13/2019 CLINICAL DATA:  Chest pain. EXAM: CHEST - 2 VIEW COMPARISON:  Chest x-ray dated October 02, 2014. FINDINGS: The heart size and mediastinal contours are within normal limits. Both lungs are clear. The visualized skeletal structures are unremarkable. IMPRESSION: No active cardiopulmonary disease. Electronically Signed   By: Titus Dubin M.D.   On: 02/13/2019 21:12    Procedures Procedures (including critical care  time)  Medications Ordered in ED Medications  sodium chloride flush (NS) 0.9 % injection 3 mL (has no administration in time range)     Initial Impression / Assessment and Plan / ED Course  I have reviewed the triage vital signs and the nursing notes.  Pertinent labs & imaging results that were available during my care of the patient were reviewed by me and considered in my medical decision making (see chart for details).  Patient with chest tightness, upper back pain.  HEART SCORE: 2-3 Initially tachycardic- TSH normal. Her EKG showed Sinus tachycardia without ischemia. Symptoms have been occurring for months- I doubt PE She has no neurologic complaints and no active chest pain- I have low suspicions for Thoracic aortic aneurysm or dissection. The patient has negative delta troponins.  BMP shows glucose of 180 and patient was recently started on metformin by her PCP.  I have low suspicion for ACS and her risk is low.  2 view CXR negative.I personally reviewed the films which show no acute abnormalities. I question whether she may have arrhythmia causing her symptoms given her tachycardia. Patient may need holter monitoring in the OP setting.  I feel the patient is safe for discharge at this time with Close PCP follow up. Patient instructed on return precautions.    Final Clinical Impressions(s) / ED Diagnoses   Final diagnoses:  Atypical chest pain    ED Discharge Orders    None       Margarita Mail, PA-C 02/14/19 0030    Sherwood Gambler, MD 02/15/19 [phone]

## 2019-02-14 LAB — I-STAT TROPONIN, ED: Troponin i, poc: 0 ng/mL (ref 0.00–0.08)

## 2019-02-14 LAB — TSH: TSH: 1.602 u[IU]/mL (ref 0.350–4.500)

## 2019-02-14 NOTE — Discharge Instructions (Signed)

## 2019-02-17 ENCOUNTER — Encounter: Payer: Self-pay | Admitting: Nurse Practitioner

## 2019-04-20 ENCOUNTER — Other Ambulatory Visit: Payer: Self-pay

## 2019-04-20 DIAGNOSIS — I10 Essential (primary) hypertension: Secondary | ICD-10-CM

## 2019-04-20 MED ORDER — OLMESARTAN MEDOXOMIL 40 MG PO TABS: 40.0000 mg | ORAL_TABLET | Freq: Every day | ORAL | 1 refills | Status: DC

## 2019-05-04 ENCOUNTER — Ambulatory Visit: Payer: 59 | Admitting: Nurse Practitioner

## 2019-05-05 ENCOUNTER — Telehealth: Payer: Self-pay

## 2019-05-05 NOTE — Telephone Encounter (Signed)
Called to reschedule pt appt that was missed 05/04/19, pt stated she will call us back at a later date to reschedule her appt  05/05/19

## 2019-05-26 ENCOUNTER — Encounter: Payer: Self-pay | Admitting: Gastroenterology

## 2019-06-15 ENCOUNTER — Ambulatory Visit: Payer: 59 | Admitting: *Deleted

## 2019-06-15 ENCOUNTER — Other Ambulatory Visit: Payer: Self-pay

## 2019-06-15 VITALS — Ht 63.0 in | Wt 175.0 lb

## 2019-06-15 DIAGNOSIS — Z1211 Encounter for screening for malignant neoplasm of colon: Secondary | ICD-10-CM

## 2019-06-15 MED ORDER — SUPREP BOWEL PREP KIT 17.5-3.13-1.6 GM/177ML PO SOLN: 1.0000 | Freq: Once | ORAL | 0 refills | Status: AC

## 2019-06-15 NOTE — Progress Notes (Signed)
No egg or soy allergy known to patient  No issues with past sedation with any surgeries  or procedures, no intubation problems  No diet pills per patient No home 02 use per patient  No blood thinners per patient  Pt denies issues with constipation  No A fib or A flutter  EMMI video sent to pt's e mail   Pt verified name, DOB, address and insurance during PV today. Pt mailed instruction packet to included paper to complete and mail back to [facility] with addressed and stamped envelope, Emmi video, copy of consent form to read and not return, and instructions. Suprep $15  coupon mailed in packet. PV completed over the phone. Pt encouraged to call with questions or issues   Pt is aware that care partner will wait in the car during parking lot; if they feel like they will be too hot to wait in the car; they may wait in the lobby.  We want them to wear a mask (we do not have any that we can provide them), practice social distancing, and we will check their temperatures when they get here.  I did remind patient that their care partner needs to stay in the parking lot the entire time. Pt will wear mask into building

## 2019-06-25 ENCOUNTER — Telehealth: Payer: Self-pay | Admitting: Gastroenterology

## 2019-06-25 NOTE — Telephone Encounter (Signed)
Left message on Vmail to call back regarding Covid-19 screening questions °Covid-19 Screening Questions ° °Do you now or have you had a fever in the last 14 days?      ° °Do you have any respiratory symptoms of shortness of breath or cough now or in the last 14 days?     ° °Do you have any family members or close contacts with diagnosed or suspected Covid-19 in the past 14 days?      ° °Have you been tested for Covid-19 and found to be positive?     ° ° °

## 2019-06-29 ENCOUNTER — Other Ambulatory Visit: Payer: Self-pay

## 2019-06-29 ENCOUNTER — Encounter: Payer: Self-pay | Admitting: Gastroenterology

## 2019-06-29 ENCOUNTER — Ambulatory Visit (AMBULATORY_SURGERY_CENTER): Payer: 59 | Admitting: Gastroenterology

## 2019-06-29 VITALS — BP 157/87 | HR 75 | Temp 98.3°F | Resp 23 | Ht 63.0 in | Wt 175.0 lb

## 2019-06-29 DIAGNOSIS — D124 Benign neoplasm of descending colon: Secondary | ICD-10-CM | POA: Diagnosis not present

## 2019-06-29 DIAGNOSIS — D120 Benign neoplasm of cecum: Secondary | ICD-10-CM | POA: Diagnosis not present

## 2019-06-29 DIAGNOSIS — Z1211 Encounter for screening for malignant neoplasm of colon: Secondary | ICD-10-CM | POA: Diagnosis not present

## 2019-06-29 MED ORDER — SODIUM CHLORIDE 0.9 % IV SOLN: 500.0000 mL | Freq: Once | INTRAVENOUS | Status: DC

## 2019-06-29 NOTE — Op Note (Signed)
[facility] Patient Name: Mary Holt Procedure Date: 06/29/2019 8:26 AM MRN: [id number] Endoscopist: Thornton Park MD, MD Age: 58 Referring MD:  Date of Birth: Apr 08, 1961 Gender: Female Account #: [id number] Procedure:                Colonoscopy Indications:              Screening for colorectal malignant neoplasm, This                            is the patient's first colonoscopy                           No known family history of colon cancer or polyps.                           No baseline GI symptoms. Medicines:                See the Anesthesia note for documentation of the                            administered medications Procedure:                Pre-Anesthesia Assessment:                           - Prior to the procedure, a History and Physical                            was performed, and patient medications and                            allergies were reviewed. The patient's tolerance of                            previous anesthesia was also reviewed. The risks                            and benefits of the procedure and the sedation                            options and risks were discussed with the patient.                            All questions were answered, and informed consent                            was obtained. Prior Anticoagulants: The patient has                            taken no previous anticoagulant or antiplatelet                            agents. ASA Grade Assessment: II - A patient with  mild systemic disease. After reviewing the risks                            and benefits, the patient was deemed in                            satisfactory condition to undergo the procedure.                           After obtaining informed consent, the colonoscope                            was passed under direct vision. Throughout the                            procedure, the patient's blood pressure, pulse, and                             oxygen saturations were monitored continuously. The                            Colonoscope was introduced through the anus and                            advanced to the the terminal ileum, with                            identification of the appendiceal orifice and IC                            valve. A second forward view of the right colon was                            performed. The colonoscopy was performed without                            difficulty. The patient tolerated the procedure                            well. The quality of the bowel preparation was                            good. The terminal ileum, ileocecal valve,                            appendiceal orifice, and rectum were photographed. Scope In: 8:32:01 AM Scope Out: 8:48:47 AM Scope Withdrawal Time: 0 hours 11 minutes 48 seconds  Total Procedure Duration: 0 hours 16 minutes 46 seconds  Findings:                 The perianal and digital rectal examinations were                            normal.  A 1 mm polyp was found in the cecum. The polyp was                            sessile. The polyp was removed with a cold snare.                            Resection and retrieval were complete. Estimated                            blood loss was minimal.                           A 3 mm polyp was found in the descending colon. The                            polyp was flat. The polyp was removed with a cold                            snare. Resection and retrieval were complete.                            Estimated blood loss was minimal.                           A 2 mm polyp was found in the sigmoid colon. The                            polyp was sessile. The polyp was removed with a                            cold snare. Resection and retrieval were complete.                            Estimated blood loss was minimal.                           Non-bleeding  internal hemorrhoids were found.                           The exam was otherwise without abnormality on                            direct and retroflexion views. Complications:            No immediate complications. Estimated blood loss:                            Minimal. Estimated Blood Loss:     Estimated blood loss was minimal. Impression:               - One 1 mm polyp in the cecum, removed with a cold                            snare. Resected  and retrieved.                           - One 3 mm polyp in the descending colon, removed                            with a cold snare. Resected and retrieved.                           - One 2 mm polyp in the sigmoid colon, removed with                            a cold snare. Resected and retrieved.                           - Non-bleeding internal hemorrhoids.                           - The examination was otherwise normal on direct                            and retroflexion views. Recommendation:           - Patient has a contact number available for                            emergencies. The signs and symptoms of potential                            delayed complications were discussed with the                            patient. Return to normal activities tomorrow.                            Written discharge instructions were provided to the                            patient.                           - Resume regular diet today.                           - Continue present medications.                           - Await pathology results.                           - Repeat colonoscopy in 3 years if all 3 polyps are                            adenomatous. Repeat colonoscopy in 7 years for  surveillance if 1 or 2 polyps are adenomatous. Thornton Park MD, MD 06/29/2019 8:54:48 AM This report has been signed electronically.

## 2019-06-29 NOTE — Progress Notes (Signed)
Called to room to assist during endoscopic procedure.  Patient ID and intended procedure confirmed with present staff. Received instructions for my participation in the procedure from the performing physician.  

## 2019-06-29 NOTE — Progress Notes (Signed)
Pt's states no medical or surgical changes since previsit or office visit. Covid screening and temp done by [facility]. Vital signs done by Rica Mote. Notified Dr.Beavers that patient had roast yesterday at 3:00 pm, but says her stool is clear yellow, ok to proceed with procedure.

## 2019-06-29 NOTE — Patient Instructions (Signed)
Discharge instructions given. Handouts on polyps and hemorrhoids. Resume previous medications. YOU HAD AN ENDOSCOPIC PROCEDURE TODAY AT [facility]:   Refer to the procedure report that was given to you for any specific questions about what was found during the examination.  If the procedure report does not answer your questions, please call your gastroenterologist to clarify.  If you requested that your care partner not be given the details of your procedure findings, then the procedure report has been included in a sealed envelope for you to review at your convenience later.  YOU SHOULD EXPECT: Some feelings of bloating in the abdomen. Passage of more gas than usual.  Walking can help get rid of the air that was put into your GI tract during the procedure and reduce the bloating. If you had a lower endoscopy (such as a colonoscopy or flexible sigmoidoscopy) you may notice spotting of blood in your stool or on the toilet paper. If you underwent a bowel prep for your procedure, you may not have a normal bowel movement for a few days.  Please Note:  You might notice some irritation and congestion in your nose or some drainage.  This is from the oxygen used during your procedure.  There is no need for concern and it should clear up in a day or so.  SYMPTOMS TO REPORT IMMEDIATELY:   Following lower endoscopy (colonoscopy or flexible sigmoidoscopy):  Excessive amounts of blood in the stool  Significant tenderness or worsening of abdominal pains  Swelling of the abdomen that is new, acute  Fever of 100F or higher   For urgent or emergent issues, a gastroenterologist can be reached at any hour by calling [phone].   DIET:  We do recommend a small meal at first, but then you may proceed to your regular diet.  Drink plenty of fluids but you should avoid alcoholic beverages for 24 hours.  ACTIVITY:  You should plan to take it easy for the rest of today and you should NOT DRIVE  or use heavy machinery until tomorrow (because of the sedation medicines used during the test).    FOLLOW UP: Our staff will call the number listed on your records 48-72 hours following your procedure to check on you and address any questions or concerns that you may have regarding the information given to you following your procedure. If we do not reach you, we will leave a message.  We will attempt to reach you two times.  During this call, we will ask if you have developed any symptoms of COVID 19. If you develop any symptoms (ie: fever, flu-like symptoms, shortness of breath, cough etc.) before then, please call [phone].  If you test positive for Covid 19 in the 2 weeks post procedure, please call and report this information to [place].    If any biopsies were taken you will be contacted by phone or by letter within the next 1-3 weeks.  Please call us at [phone] if you have not heard about the biopsies in 3 weeks.    SIGNATURES/CONFIDENTIALITY: You and/or your care partner have signed paperwork which will be entered into your electronic medical record.  These signatures attest to the fact that that the information above on your After Visit Summary has been reviewed and is understood.  Full responsibility of the confidentiality of this discharge information lies with you and/or your care-partner.

## 2019-06-29 NOTE — Progress Notes (Signed)
Report to PACU, RN, vss, BBS= Clear.  

## 2019-07-01 ENCOUNTER — Telehealth: Payer: Self-pay | Admitting: *Deleted

## 2019-07-01 NOTE — Telephone Encounter (Signed)
  Follow up Call-  Call back number 06/29/2019  Post procedure Call Back phone  # [phone]  Permission to leave phone message Yes  Some recent data might be hidden     Patient questions:  Do you have a fever, pain , or abdominal swelling? No. Pain Score  0 *  Have you tolerated food without any problems? Yes.    Have you been able to return to your normal activities? Yes.    Do you have any questions about your discharge instructions: Diet   No. Medications  No. Follow up visit  No.  Do you have questions or concerns about your Care? No.  Actions: * If pain score is 4 or above: No action needed, pain <4.  1. Have you developed a fever since your procedure? no  2.   Have you had an respiratory symptoms (SOB or cough) since your procedure? no  3.   Have you tested positive for COVID 19 since your procedure no  4.   Have you had any family members/close contacts diagnosed with the COVID 19 since your procedure?  no   If yes to any of these questions please route to Joylene John, RN and Alphonsa Gin, Therapist, sports.

## 2019-07-02 ENCOUNTER — Encounter: Payer: Self-pay | Admitting: Gastroenterology

## 2019-08-04 ENCOUNTER — Telehealth: Payer: Self-pay

## 2019-08-04 NOTE — Telephone Encounter (Signed)
[facility] dermatology sent over a letter stating pt cancelled her appt. I have called pt to see why she cancelled it. Left pt v/m to call the office. YRL,RMA

## 2019-08-05 ENCOUNTER — Telehealth: Payer: Self-pay

## 2019-08-05 NOTE — Telephone Encounter (Signed)
Left detailed message per pt request. Need to know why she cancelled her appt for [facility] they sent a letter

## 2019-08-30 ENCOUNTER — Other Ambulatory Visit: Payer: Self-pay | Admitting: Nurse Practitioner

## 2019-08-30 DIAGNOSIS — I10 Essential (primary) hypertension: Secondary | ICD-10-CM

## 2019-09-10 ENCOUNTER — Telehealth: Payer: Self-pay

## 2019-11-16 ENCOUNTER — Other Ambulatory Visit: Payer: Self-pay | Admitting: Nurse Practitioner

## 2019-11-16 DIAGNOSIS — I10 Essential (primary) hypertension: Secondary | ICD-10-CM

## 2019-12-01 ENCOUNTER — Telehealth: Payer: Self-pay

## 2019-12-01 NOTE — Telephone Encounter (Signed)
Patient called stating she needs a refill on her bp med the pharmacy had requested a refill for her but it was denied I advised the pt that it was denied because she was supposed to f/u with up in May. We need her to be seen so that we can do bloodwork and make sure the medication is working well. She advised it is hard her to come into the office because of her work schedule she stated she works from 11 until 8:30pm I offered her an 8:30am appointment she declined and stated it is hard for her to get up in the morning. So we have scheduled her a virtual appt instead and she is to find a way to check her bp and she was also notified that she will only get a 30 day supply of her blood pressure med until she is seen here in the office per JM. YRL,RMA

## 2019-12-02 ENCOUNTER — Telehealth: Payer: Self-pay | Admitting: Nurse Practitioner

## 2019-12-07 ENCOUNTER — Ambulatory Visit (INDEPENDENT_AMBULATORY_CARE_PROVIDER_SITE_OTHER): Payer: 59 | Admitting: Nurse Practitioner

## 2019-12-07 ENCOUNTER — Other Ambulatory Visit: Payer: Self-pay

## 2019-12-07 ENCOUNTER — Encounter: Payer: Self-pay | Admitting: Nurse Practitioner

## 2019-12-07 VITALS — BP 168/100 | HR 92 | Temp 98.6°F | Ht 62.0 in | Wt 178.8 lb

## 2019-12-07 DIAGNOSIS — E119 Type 2 diabetes mellitus without complications: Secondary | ICD-10-CM

## 2019-12-07 DIAGNOSIS — R0789 Other chest pain: Secondary | ICD-10-CM | POA: Diagnosis not present

## 2019-12-07 DIAGNOSIS — Z8679 Personal history of other diseases of the circulatory system: Secondary | ICD-10-CM

## 2019-12-07 DIAGNOSIS — R21 Rash and other nonspecific skin eruption: Secondary | ICD-10-CM | POA: Diagnosis not present

## 2019-12-07 DIAGNOSIS — I10 Essential (primary) hypertension: Secondary | ICD-10-CM | POA: Diagnosis not present

## 2019-12-07 DIAGNOSIS — Z9119 Patient's noncompliance with other medical treatment and regimen: Secondary | ICD-10-CM

## 2019-12-07 MED ORDER — METFORMIN HCL 500 MG PO TABS: 500.0000 mg | ORAL_TABLET | Freq: Two times a day (BID) | ORAL | 1 refills | Status: DC

## 2019-12-07 MED ORDER — SIMVASTATIN 10 MG PO TABS: 10.0000 mg | ORAL_TABLET | Freq: Every evening | ORAL | 1 refills | Status: DC

## 2019-12-07 MED ORDER — EDARBYCLOR 40-12.5 MG PO TABS: 1.0000 | ORAL_TABLET | Freq: Every day | ORAL | 1 refills | Status: DC

## 2019-12-07 MED ORDER — METFORMIN HCL 500 MG PO TABS: 500.0000 mg | ORAL_TABLET | Freq: Two times a day (BID) | ORAL | 11 refills | Status: DC

## 2019-12-07 NOTE — Progress Notes (Signed)
This visit occurred during the SARS-CoV-2 public health emergency.  Safety protocols were in place, including screening questions prior to the visit, additional usage of staff PPE, and extensive cleaning of exam room while observing appropriate contact time as indicated for disinfecting solutions.  Subjective:     Patient ID: Mary Holt , female    DOB: [birth year] , 58 y.o.   MRN: [id number]   Chief Complaint  Patient presents with  . Hypertension    HPI  She reports her blood pressure has been taking her medications daily.  She is working long hours.  Feels her sleep could be better and she has been eating more bad foods.    Hypertension This is a chronic problem. The current episode started more than 1 year ago. The problem has been gradually worsening since onset. The problem is uncontrolled. Pertinent negatives include no anxiety, chest pain, headaches or palpitations. There are no associated agents to hypertension. Risk factors for coronary artery disease include dyslipidemia, sedentary lifestyle and obesity. Past treatments include angiotensin blockers. There are no compliance problems.  There is no history of angina. There is no history of chronic renal disease.     Past Medical History:  Diagnosis Date  . Diabetes mellitus without complication ([place])   . Heart murmur    asymptomatic  . Hyperlipidemia   . Hypertension   . Kidney stones   . Kidney stones    several times, last  one 2014  . Mitral valve prolapse   . MVP (mitral valve prolapse)      Family History  Problem Relation Age of Onset  . Stroke Mother   . Heart attack Father   . Breast cancer Sister   . Ovarian cancer Sister   . Lymphoma Sister   . Colon cancer Neg Hx   . Colon polyps Neg Hx   . Esophageal cancer Neg Hx   . Rectal cancer Neg Hx   . Stomach cancer Neg Hx      Current Outpatient Medications:  .  ibuprofen (ADVIL,MOTRIN) 200 MG tablet, Take 200 mg by mouth every 6 (six) hours as  needed (for headaches or pain). , Disp: , Rfl:  .  metFORMIN (GLUCOPHAGE) 500 MG tablet, Take 1 tablet (500 mg total) by mouth 2 (two) times daily with a meal. (Patient taking differently: Take 500 mg by mouth daily with lunch. ), Disp: 60 tablet, Rfl: 11 .  mometasone (ELOCON) 0.1 % cream, Apply to affected area daily (Patient taking differently: Apply 1 application topically See admin instructions. Apply as directed to affected areas of ears once a day), Disp: 45 g, Rfl: 1 .  olmesartan (BENICAR) 40 MG tablet, Take 1 tablet (40 mg total) by mouth daily., Disp: 90 tablet, Rfl: 1 .  metroNIDAZOLE (FLAGYL) 500 MG tablet, Take 1 tablet (500 mg total) by mouth 3 (three) times daily. (Patient not taking: Reported on 06/29/2019), Disp: 30 tablet, Rfl: 0 .  simvastatin (ZOCOR) 10 MG tablet, Take 1 tablet (10 mg total) by mouth every evening. (Patient not taking: Reported on 12/07/2019), Disp: 30 tablet, Rfl: 2  Current Facility-Administered Medications:  .  0.9 %  sodium chloride infusion, 500 mL, Intravenous, Once, [place], MD   Allergies  Allergen Reactions  . Nabumetone Rash  . Penicillins Rash    Did it involve swelling of the face/tongue/throat, SOB, or low BP? Yes Did it involve sudden or severe rash/hives, skin peeling, or any reaction on the inside of your mouth or  nose? No Did you need to seek medical attention at a hospital or doctor's office? No When did it last happen? >10 years ago If all above answers are "NO", may proceed with cephalosporin use.      Review of Systems  Constitutional: Negative.   Respiratory: Negative.  Negative for cough.   Cardiovascular: Negative.  Negative for chest pain, palpitations and leg swelling.  Neurological: Negative for dizziness and headaches.  Psychiatric/Behavioral: Negative.      Today's Vitals   12/07/19 1132  BP: (!) 168/100  Pulse: 92  Temp: 98.6 F (37 C)  TempSrc: Oral  Weight: 178 lb 12.8 oz (81.1 kg)  Height: '5\' 2"'   (1.575 m)  PainSc: 0-No pain   Body mass index is 32.7 kg/m.   Objective:  Physical Exam Constitutional:      Appearance: Normal appearance.  Cardiovascular:     Rate and Rhythm: Normal rate and regular rhythm.     Pulses: Normal pulses.     Heart sounds: Normal heart sounds. No murmur.  Pulmonary:     Effort: Pulmonary effort is normal. No respiratory distress.     Breath sounds: Normal breath sounds.  Musculoskeletal:     Comments: Reports pain to lateral left side near chest area.  She reports she has a history of mitral valve prolapse.    Skin:    Capillary Refill: Capillary refill takes less than 2 seconds.  Neurological:     General: No focal deficit present.     Mental Status: She is alert and oriented to person, place, and time.  Psychiatric:        Mood and Affect: Mood normal.        Behavior: Behavior normal.        Thought Content: Thought content normal.        Judgment: Judgment normal.         Assessment And Plan:     1. Rash  Intermittent rash to ears none presently needs refill of elocon   2. Type 2 diabetes mellitus without complication, without long-term current use of insulin (HCC)  Chronic was elevated at the last visit and has only been taking metformin once a day instead of twice a day  Will recheck HgbA1c and reordered her metformin she is to take two times a day.   - Lipid Profile - metFORMIN (GLUCOPHAGE) 500 MG tablet; Take 1 tablet (500 mg total) by mouth 2 (two) times daily with a meal.  Dispense: 180 tablet; Refill: 1 - simvastatin (ZOCOR) 10 MG tablet; Take 1 tablet (10 mg total) by mouth every evening.  Dispense: 90 tablet; Refill: 1  3. Uncontrolled hypertension  Chronic and poorly controlled, has not been seen since February reports due to work  She has been having an alternating work schedule which could be affecting her blood pressure due to poor sleep.   Advised to avoid high salt foods  - CMP14+EGFR - Ambulatory referral to  Cardiology  4. Essential hypertension  She has not been seen since February, reports her blood pressure was elevated last week  Will start her on Edarbyclor 40/12.80m to take daily, Rx sent to [facility] - Hemoglobin A1c - Azilsartan-Chlorthalidone (EDARBYCLOR) 40-12.5 MG TABS; Take 1 tablet by mouth daily.  Dispense: 90 tablet; Refill: 1  5. History of mitral valve prolapse  She reports a previous history of mitral valve prolapse and does not remember the provider due to her poorly controlled hypertension I would like to refer her  to cardiology for further evaluation.   - Ambulatory referral to Cardiology   [name], FNP    THE PATIENT IS ENCOURAGED TO PRACTICE SOCIAL DISTANCING DUE TO THE COVID-19 PANDEMIC.

## 2019-12-08 LAB — CMP14+EGFR
ALT: 30 IU/L (ref 0–32)
AST: 21 IU/L (ref 0–40)
Albumin/Globulin Ratio: 1.8 (ref 1.2–2.2)
Albumin: 4.8 g/dL (ref 3.8–4.9)
Alkaline Phosphatase: 113 IU/L (ref 39–117)
BUN/Creatinine Ratio: 15 (ref 9–23)
BUN: 12 mg/dL (ref 6–24)
Bilirubin Total: 0.3 mg/dL (ref 0.0–1.2)
CO2: 23 mmol/L (ref 20–29)
Calcium: 9.2 mg/dL (ref 8.7–10.2)
Chloride: 105 mmol/L (ref 96–106)
Creatinine, Ser: 0.81 mg/dL (ref 0.57–1.00)
GFR calc Af Amer: 92 mL/min/{1.73_m2} (ref >59)
GFR calc non Af Amer: 80 mL/min/{1.73_m2} (ref >59)
Globulin, Total: 2.6 g/dL (ref 1.5–4.5)
Glucose: 113 mg/dL — ABNORMAL HIGH (ref 65–99)
Potassium: 4.1 mmol/L (ref 3.5–5.2)
Sodium: 143 mmol/L (ref 134–144)
Total Protein: 7.4 g/dL (ref 6.0–8.5)

## 2019-12-08 LAB — LIPID PANEL
Chol/HDL Ratio: 4.6 ratio — ABNORMAL HIGH (ref 0.0–4.4)
Cholesterol, Total: 250 mg/dL — ABNORMAL HIGH (ref 100–199)
HDL: 54 mg/dL (ref >39)
LDL Chol Calc (NIH): 176 mg/dL — ABNORMAL HIGH (ref 0–99)
Triglycerides: 113 mg/dL (ref 0–149)
VLDL Cholesterol Cal: 20 mg/dL (ref 5–40)

## 2019-12-08 LAB — HEMOGLOBIN A1C
Est. average glucose Bld gHb Est-mCnc: 148 mg/dL
Hgb A1c MFr Bld: 6.8 % — ABNORMAL HIGH (ref 4.8–5.6)

## 2019-12-10 ENCOUNTER — Telehealth: Payer: Self-pay

## 2019-12-10 NOTE — Telephone Encounter (Signed)
Patient notified abut her lab results. YRL,RMA

## 2020-01-05 ENCOUNTER — Ambulatory Visit (HOSPITAL_COMMUNITY): Payer: 59 | Attending: Cardiovascular Disease

## 2020-01-06 ENCOUNTER — Other Ambulatory Visit: Payer: Self-pay

## 2020-01-06 ENCOUNTER — Encounter: Payer: Self-pay | Admitting: Cardiovascular Disease

## 2020-01-06 ENCOUNTER — Ambulatory Visit (INDEPENDENT_AMBULATORY_CARE_PROVIDER_SITE_OTHER): Payer: 59 | Admitting: Cardiovascular Disease

## 2020-01-06 VITALS — BP 128/82 | HR 99 | Ht 63.0 in | Wt 180.2 lb

## 2020-01-06 DIAGNOSIS — R079 Chest pain, unspecified: Secondary | ICD-10-CM | POA: Diagnosis not present

## 2020-01-06 DIAGNOSIS — R0681 Apnea, not elsewhere classified: Secondary | ICD-10-CM | POA: Diagnosis not present

## 2020-01-06 DIAGNOSIS — R0683 Snoring: Secondary | ICD-10-CM | POA: Diagnosis not present

## 2020-01-06 DIAGNOSIS — I341 Nonrheumatic mitral (valve) prolapse: Secondary | ICD-10-CM | POA: Insufficient documentation

## 2020-01-06 DIAGNOSIS — Z01812 Encounter for preprocedural laboratory examination: Secondary | ICD-10-CM

## 2020-01-06 HISTORY — DX: Chest pain, unspecified: R07.9

## 2020-01-06 HISTORY — DX: Snoring: R06.83

## 2020-01-06 MED ORDER — METOPROLOL TARTRATE 100 MG PO TABS: ORAL_TABLET | ORAL | 0 refills | Status: DC

## 2020-01-06 NOTE — Patient Instructions (Addendum)
Medication Instructions:  TAKE METOPROLOL 100 MG 2 HOURS PRIOR TO CARDIAC CT  *If you need a refill on your cardiac medications before your next appointment, please call your pharmacy*  Lab Work: BMET 1 WEEK PRIOR TO CT   If you have labs (blood work) drawn today and your tests are completely normal, you will receive your results only by: Marland Kitchen MyChart Message (if you have MyChart) OR . A paper copy in the mail If you have any lab test that is abnormal or we need to change your treatment, we will call you to review the results.  Testing/Procedures: Your physician has requested that you have cardiac CT. Cardiac computed tomography (CT) is a painless test that uses an x-ray machine to take clear, detailed pictures of your heart. For further information please visit [email or web address]. Please follow instruction sheet as given. THE OFFICE WILL CALL YOU TO SCHEDULE ONCE YOUR INSURANCE HAS APPROVED   Your physician has requested that you have an echocardiogram. Echocardiography is a painless test that uses sound waves to create images of your heart. It provides your doctor with information about the size and shape of your heart and how well your heart's chambers and valves are working. This procedure takes approximately one hour. There are no restrictions for this procedure. [place] [address]  Your physician has recommended that you have a sleep study. This test records several body functions during sleep, including: brain activity, eye movement, oxygen and carbon dioxide blood levels, heart rate and rhythm, breathing rate and rhythm, the flow of air through your mouth and nose, snoring, body muscle movements, and chest and belly movement. THE OFFICE WILL CALL YOU TO SCHEDULE ONCE YOUR INSURANCE HAS APPROVED   Follow-Up: At [facility], you and your health needs are our priority.  As part of our continuing mission to provide you with exceptional heart care, we have created  designated Provider Care Teams.  These Care Teams include your primary Cardiologist (physician) and Advanced Practice Providers (APPs -  Physician Assistants and Nurse Practitioners) who all work together to provide you with the care you need, when you need it.  Your next appointment:   6 week(s)  The format for your next appointment:   In Person  Provider:   You may see DR [facility] or one of the following Advanced Practice Providers on your designated Care Team:    Kerin Ransom, PA-C  [place], [place]  [name], [place]   Other Instructions  Your cardiac CT will be scheduled at one of the below locations:   [facility] [address] [place], [place] [address] [phone]  [facility] [address] [place], [place] [address] [phone]  If scheduled at [facility] [address], please arrive at the [facility] main entrance of [facility] 30-45 minutes prior to test start time. Proceed to the [facility] Radiology Department (first floor) to check-in and test prep.  If scheduled at [facility], please arrive 15 mins early for check-in and test prep.  Please follow these instructions carefully (unless otherwise directed):  Hold all erectile dysfunction medications at least 3 days (72 hrs) prior to test.  On the Night Before the Test: . Be sure to Drink plenty of water. . Do not consume any caffeinated/decaffeinated beverages or chocolate 12 hours prior to your test. . Do not take any antihistamines 12 hours prior to your test. .  If the patient has contrast allergy: ? Patient will need a prescription for Prednisone and very clear instructions (as follows): 1. Prednisone 50 mg - take 13 hours prior to test 2. Take another Prednisone 50 mg 7 hours prior to test 3. Take another Prednisone 50 mg 1 hour prior to test 4. Take Benadryl 50 mg 1 hour prior to test . Patient  must complete all four doses of above prophylactic medications. . Patient will need a ride after test due to Benadryl.  On the Day of the Test: . Drink plenty of water. Do not drink any water within one hour of the test. . Do not eat any food 4 hours prior to the test. . You may take your regular medications prior to the test.  . Take metoprolol (Lopressor) two hours prior to test. . HOLD Furosemide/Hydrochlorothiazide morning of the test. . FEMALES- please wear underwire-free bra if available       After the Test: . Drink plenty of water. . After receiving IV contrast, you may experience a mild flushed feeling. This is normal. . On occasion, you may experience a mild rash up to 24 hours after the test. This is not dangerous. If this occurs, you can take Benadryl 25 mg and increase your fluid intake. . If you experience trouble breathing, this can be serious. If it is severe call 911 IMMEDIATELY. If it is mild, please call our office. . If you take any of these medications: Glipizide/Metformin, Avandament, Glucavance, please do not take 48 hours after completing test unless otherwise instructed.   Once we have confirmed authorization from your insurance company, we will call you to set up a date and time for your test.   For non-scheduling related questions, please contact the cardiac imaging nurse navigator should you have any questions/concerns: Marchia Bond, RN Navigator Cardiac Imaging [facility] [phone] Office    Cardiac CT Angiogram A cardiac CT angiogram is a procedure to look at the heart and the area around the heart. It may be done to help find the cause of chest pains or other symptoms of heart disease. During this procedure, a substance called contrast dye is injected into the blood vessels in the area to be checked. A large X-ray machine, called a CT scanner, then takes detailed pictures of the heart and the surrounding area. The procedure is  also sometimes called a coronary CT angiogram, coronary artery scanning, or CTA. A cardiac CT angiogram allows the health care provider to see how well blood is flowing to and from the heart. The health care provider will be able to see if there are any problems, such as:  Blockage or narrowing of the coronary arteries in the heart.  Fluid around the heart.  Signs of weakness or disease in the muscles, valves, and tissues of the heart. Tell a health care provider about:  Any allergies you have. This is especially important if you have had a previous allergic reaction to contrast dye.  All medicines you are taking, including vitamins, herbs, eye drops, creams, and over-the-counter medicines.  Any blood disorders you have.  Any surgeries you have had.  Any medical conditions you have.  Whether you are pregnant or may be pregnant.  Any anxiety disorders, chronic pain, or other conditions you have that may increase your stress or prevent you from lying still. What are the risks? Generally, this is a safe procedure. However, problems may occur, including:  Bleeding.  Infection.  Allergic reactions to medicines or dyes.  Damage to other structures or organs.  Kidney damage from the contrast dye that is used.  Increased risk of cancer from radiation exposure. This risk is low. Talk with your health care provider about: ? The risks and benefits of testing. ? How you can receive the lowest dose of radiation. What happens before the procedure?  Wear comfortable clothing and remove any jewelry, glasses, dentures, and hearing aids.  Follow instructions from your health care provider about eating and drinking. This may include: ? For 12 hours before the procedure -- avoid caffeine. This includes tea, coffee, soda, energy drinks, and diet pills. Drink plenty of water or other fluids that do not have caffeine in them. Being well hydrated can prevent complications. ? For 4-6 hours before  the procedure -- stop eating and drinking. The contrast dye can cause nausea, but this is less likely if your stomach is empty.  Ask your health care provider about changing or stopping your regular medicines. This is especially important if you are taking diabetes medicines, blood thinners, or medicines to treat problems with erections (erectile dysfunction). What happens during the procedure?   Hair on your chest may need to be removed so that small sticky patches called electrodes can be placed on your chest. These will transmit information that helps to monitor your heart during the procedure.  An IV will be inserted into one of your veins.  You might be given a medicine to control your heart rate during the procedure. This will help to ensure that good images are obtained.  You will be asked to lie on an exam table. This table will slide in and out of the CT machine during the procedure.  Contrast dye will be injected into the IV. You might feel warm, or you may get a metallic taste in your mouth.  You will be given a medicine called nitroglycerin. This will relax or dilate the arteries in your heart.  The table that you are lying on will move into the CT machine tunnel for the scan.  The person running the machine will give you instructions while the scans are being done. You may be asked to: ? Keep your arms above your head. ? Hold your breath. ? Stay very still, even if the table is moving.  When the scanning is complete, you will be moved out of the machine.  The IV will be removed. The procedure may vary among health care providers and hospitals. What can I expect after the procedure? After your procedure, it is common to have:  A metallic taste in your mouth from the contrast dye.  A feeling of warmth.  A headache from the nitroglycerin. Follow these instructions at home:  Take over-the-counter and prescription medicines only as told by your health care  provider.  If you are told, drink enough fluid to keep your urine pale yellow. This will help to flush the contrast dye out of your body.  Most people can return to their normal activities right after the procedure. Ask your health care provider what activities are safe for you.  It is up to you to get the results of your procedure. Ask your health care provider, or the department that is doing the procedure, when your results will be ready.  Keep all follow-up visits as told by your health care provider. This is important. Contact a health care provider if:  You have any symptoms of allergy to the contrast  dye. These include: ? Shortness of breath. ? Rash or hives. ? A racing heartbeat. Summary  A cardiac CT angiogram is a procedure to look at the heart and the area around the heart. It may be done to help find the cause of chest pains or other symptoms of heart disease.  During this procedure, a large X-ray machine, called a CT scanner, takes detailed pictures of the heart and the surrounding area after a contrast dye has been injected into blood vessels in the area.  Ask your health care provider about changing or stopping your regular medicines before the procedure. This is especially important if you are taking diabetes medicines, blood thinners, or medicines to treat erectile dysfunction.  If you are told, drink enough fluid to keep your urine pale yellow. This will help to flush the contrast dye out of your body. This information is not intended to replace advice given to you by your health care provider. Make sure you discuss any questions you have with your health care provider. Document Revised: 08/05/2019 Document Reviewed: 08/05/2019 Elsevier Patient Education  [place].    Echocardiogram An echocardiogram is a procedure that uses painless sound waves (ultrasound) to produce an image of the heart. Images from an echocardiogram can provide important information  about:  Signs of coronary artery disease (CAD).  Aneurysm detection. An aneurysm is a weak or damaged part of an artery wall that bulges out from the normal force of blood pumping through the body.  Heart size and shape. Changes in the size or shape of the heart can be associated with certain conditions, including heart failure, aneurysm, and CAD.  Heart muscle function.  Heart valve function.  Signs of a past heart attack.  Fluid buildup around the heart.  Thickening of the heart muscle.  A tumor or infectious growth around the heart valves. Tell a health care provider about:  Any allergies you have.  All medicines you are taking, including vitamins, herbs, eye drops, creams, and over-the-counter medicines.  Any blood disorders you have.  Any surgeries you have had.  Any medical conditions you have.  Whether you are pregnant or may be pregnant. What are the risks? Generally, this is a safe procedure. However, problems may occur, including:  Allergic reaction to dye (contrast) that may be used during the procedure. What happens before the procedure? No specific preparation is needed. You may eat and drink normally. What happens during the procedure?   An IV tube may be inserted into one of your veins.  You may receive contrast through this tube. A contrast is an injection that improves the quality of the pictures from your heart.  A gel will be applied to your chest.  A wand-like tool (transducer) will be moved over your chest. The gel will help to transmit the sound waves from the transducer.  The sound waves will harmlessly bounce off of your heart to allow the heart images to be captured in real-time motion. The images will be recorded on a computer. The procedure may vary among health care providers and hospitals. What happens after the procedure?  You may return to your normal, everyday life, including diet, activities, and medicines, unless your health care  provider tells you not to do that. Summary  An echocardiogram is a procedure that uses painless sound waves (ultrasound) to produce an image of the heart.  Images from an echocardiogram can provide important information about the size and shape of your heart, heart muscle  function, heart valve function, and fluid buildup around your heart.  You do not need to do anything to prepare before this procedure. You may eat and drink normally.  After the echocardiogram is completed, you may return to your normal, everyday life, unless your health care provider tells you not to do that. This information is not intended to replace advice given to you by your health care provider. Make sure you discuss any questions you have with your health care provider. Document Revised: 04/02/2019 Document Reviewed: 01/12/2017 Elsevier Patient Education  [place].    Sleep Studies A sleep study (polysomnogram) is a series of tests done while you are sleeping. A sleep study records your brain waves, heart rate, breathing rate, oxygen level, and eye and leg movements. A sleep study helps your health care provider:  See how well you sleep.  Diagnose a sleep disorder.  Determine how severe your sleep disorder is.  Create a plan to treat your sleep disorder. Your health care provider may recommend a sleep study if you:  Feel sleepy on most days.  Snore loudly while sleeping.  Have unusual behaviors while you sleep, such as walking.  Have brief periods in which you stop breathing during sleep (sleepapnea).  Fall asleep suddenly during the day (narcolepsy).  Have trouble falling asleep or staying asleep (insomnia).  Feel like you need to move your legs when trying to fall asleep (restless legs syndrome).  Move your legs by flexing and extending them regularly while asleep (periodic limb movement disorder).  Act out your dreams while you sleep (sleep behavior disorder).  Feel like you cannot  move when you first wake up (sleep paralysis). What tests are part of a sleep study? Most sleep studies record the following during sleep:  Brain activity.  Eye movements.  Heart rate and rhythm.  Breathing rate and rhythm.  Blood-oxygen level.  Blood pressure.  Chest and belly movement as you breathe.  Arm and leg movements.  Snoring or other noises.  Body position. Where are sleep studies done? Sleep studies are done at sleep centers. A sleep center may be inside a hospital, office, or clinic. The room where you have the study may look like a hospital room or a hotel room. The health care providers doing the study may come in and out of the room during the study. Most of the time, they will be in another room monitoring your test as you sleep. How are sleep studies done? Most sleep studies are done during a normal period of time for a full night of sleep. You will arrive at the study center in the evening and go home in the morning. Before the test  Bring your pajamas and toothbrush with you to the sleep study.  Do not have caffeine on the day of your sleep study.  Do not drink alcohol on the day of your sleep study.  Your health care provider will let you know if you should stop taking any of your regular medicines before the test. During the test      Round, sticky patches with sensors attached to recording wires (electrodes) are placed on your scalp, face, chest, and limbs.  Wires from all the electrodes and sensors run from your bed to a computer. The wires can be taken off and put back on if you need to get out of bed to go to the bathroom.  A sensor is placed over your nose to measure airflow.  A finger clip  is put on your finger or ear to measure your blood oxygen level (pulse oximetry).  A belt is placed around your belly and a belt is placed around your chest to measure breathing movements.  If you have signs of the sleep disorder called sleep apnea  during your test, you may get a treatment mask to wear for the second half of the night. ? The mask provides positive airway pressure (PAP) to help you breathe better during sleep. This may greatly improve your sleep apnea. ? You will then have all tests done again with the mask in place to see if your measurements and recordings change. After the test  A medical doctor who specializes in sleep will evaluate the results of your sleep study and share them with you and your primary health care provider.  Based on your results, your medical history, and a physical exam, you may be diagnosed with a sleep disorder, such as: ? Sleep apnea. ? Restless legs syndrome. ? Sleep-related behavior disorder. ? Sleep-related movement disorders. ? Sleep-related seizure disorders.  Your health care team will help determine your treatment options based on your diagnosis. This may include: ? Improving your sleep habits (sleep hygiene). ? Wearing a continuous positive airway pressure (CPAP) or bi-level positive airway pressure (BPAP) mask. ? Wearing an oral device at night to improve breathing and reduce snoring. ? Taking medicines. Follow these instructions at home:  Take over-the-counter and prescription medicines only as told by your health care provider.  If you are instructed to use a CPAP or BPAP mask, make sure you use it nightly as directed.  Make any lifestyle changes that your health care provider recommends.  If you were given a device to open your airway while you sleep, use it only as told by your health care provider.  Do not use any tobacco products, such as cigarettes, chewing tobacco, and e-cigarettes. If you need help quitting, ask your health care provider.  Keep all follow-up visits as told by your health care provider. This is important. Summary  A sleep study (polysomnogram) is a series of tests done while you are sleeping. It shows how well you sleep.  Most sleep studies are  done over one full night of sleep. You will arrive at the study center in the evening and go home in the morning.  If you have signs of the sleep disorder called sleep apnea during your test, you may get a treatment mask to wear for the second half of the night.  A medical doctor who specializes in sleep will evaluate the results of your sleep study and share them with your primary health care provider. This information is not intended to replace advice given to you by your health care provider. Make sure you discuss any questions you have with your health care provider. Document Revised: 05/27/2019 Document Reviewed: 01/07/2018 Elsevier Patient Education  [place].

## 2020-01-06 NOTE — Progress Notes (Signed)
Cardiology Office Note   Date:  01/06/2020   ID:  Mary Holt, DOB 07/08/1961, MRN [id number]  PCP:  Mary Brine, FNP  Cardiologist:   Mary Latch, MD   No chief complaint on file.    History of Present Illness: Mary Holt is a 59 y.o. female with hypertension, diabetes,hyperlipidemia, and mitral valve prolapse who is being seen today for the evaluation of hypertension at the request of Mary Holt, [place].  Ms. Brach reports that when she was a young child she had episodes of chest pain.  She was evaluated and found to have mitral valve prolapse.  She previously took antibiotics prior to going to the dentist and developed a rash.  She was subsequently told that she no longer needed to take antibiotics.  Lately she has been experiencing chest pain.  It happens more frequently when she lays down.  She feels pain in her L side and under her left breast.  She sometimes has it when she bends in certain positions. The episodes occur less than once per week. It seems to occur randomly, but may be associated with stress.  It is not associated with shortness of breath, nausea or diaphoresis.  She does have a history of GERD but feels that this has been well-controlled lately.  She does not get much formal exercise.  She works the third shift so it is dark when she gets home and too cold to exercise in the morning.  When she does exercise she denies any exertional chest pain or shortness of breath.  She has not been sleeping well because of back pain and the chest discomfort.  She does not feel well-rested when she wakes up in the mornings.  She saw [name].  At that time her blood pressure was poorly-controlled.  She was started on his losartan/chlorthalidone.  Olmesartan was discontinued.  She was also started on simvastatin.  Since that time her blood pressure has been better-controlled.  Due to her blood pressure and reports of chest discomfort she was referred to cardiology  for further evaluation.  Past Medical History:  Diagnosis Date  . Chest pain of uncertain etiology 123XX123  . Diabetes mellitus without complication ([place])   . Heart murmur    asymptomatic  . Hyperlipidemia   . Hypertension   . Kidney stones   . Kidney stones    several times, last  one 2014  . Mitral valve prolapse   . MVP (mitral valve prolapse)   . Snoring 01/06/2020    Past Surgical History:  Procedure Laterality Date  . BACK SURGERY     x2  . BREAST SURGERY     cyst right breast  . CARPAL TUNNEL RELEASE Bilateral   . DILATATION & CURRETTAGE/HYSTEROSCOPY WITH RESECTOCOPE N/A 08/11/2013   Procedure: DILATATION & CURETTAGE/HYSTEROSCOPY WITH RESECTOCOPE;  Surgeon: Marvene Staff, MD;  Location: [facility];  Service: Gynecology;  Laterality: N/A;  . TONSILLECTOMY    . TRIGGER FINGER RELEASE     left thumb  . TUBAL LIGATION       Current Outpatient Medications  Medication Sig Dispense Refill  . Azilsartan-Chlorthalidone (EDARBYCLOR) 40-12.5 MG TABS Take 1 tablet by mouth daily. 90 tablet 1  . ibuprofen (ADVIL,MOTRIN) 200 MG tablet Take 200 mg by mouth every 6 (six) hours as needed (for headaches or pain).     . metFORMIN (GLUCOPHAGE) 500 MG tablet Take 1 tablet (500 mg total) by mouth 2 (two) times daily with a meal.  180 tablet 1  . metoprolol tartrate (LOPRESSOR) 100 MG tablet TAKE 1 TABLET 2 HOURS PRIOR TO CT 1 tablet 0  . mometasone (ELOCON) 0.1 % cream Apply to affected area daily (Patient taking differently: Apply 1 application topically See admin instructions. Apply as directed to affected areas of ears once a day) 45 g 1  . simvastatin (ZOCOR) 10 MG tablet Take 1 tablet (10 mg total) by mouth every evening. 90 tablet 1   Current Facility-Administered Medications  Medication Dose Route Frequency Provider Last Rate Last Admin  . 0.9 %  sodium chloride infusion  500 mL Intravenous Once Thornton Park, MD        Allergies:   Nabumetone and Penicillins     Social History:  The patient  reports that she has never smoked. She has never used smokeless tobacco. She reports that she does not drink alcohol or use drugs.   Family History:  The patient's family history includes Breast cancer in her sister; Heart attack in her brother, brother, and father; Lymphoma in her sister; Ovarian cancer in her sister; Stroke in her mother.    ROS:  Please see the history of present illness.   Otherwise, review of systems are positive for none.   All other systems are reviewed and negative.    PHYSICAL EXAM: VS:  BP 128/82   Pulse 99   Ht 5\' 3"  (1.6 m)   Wt 180 lb 3.2 oz (81.7 kg)   LMP 09/30/2014   BMI 31.92 kg/m  , BMI Body mass index is 31.92 kg/m. GENERAL:  Well appearing HEENT:  Pupils equal round and reactive, fundi not visualized, oral mucosa unremarkable NECK:  No jugular venous distention, waveform within normal limits, carotid upstroke brisk and symmetric, no bruits, no thyromegaly LYMPHATICS:  No cervical adenopathy LUNGS:  Clear to auscultation bilaterally HEART:  RRR.  PMI not displaced or sustained,S1 and S2 within normal limits, no S3, no S4, no clicks, no rubs, no murmurs ABD:  Flat, positive bowel sounds normal in frequency in pitch, no bruits, no rebound, no guarding, no midline pulsatile mass, no hepatomegaly, no splenomegaly EXT:  2 plus pulses throughout, no edema, no cyanosis no clubbing SKIN:  No rashes no nodules NEURO:  Cranial nerves II through XII grossly intact, motor grossly intact throughout PSYCH:  Cognitively intact, oriented to person place and time   EKG:  EKG is ordered today. The ekg ordered today demonstrates sinus rhythm 99 bpm.  Cannot rule out prior inferior infarct.   Recent Labs: 02/13/2019: Hemoglobin 13.2; Platelets 320; TSH 1.602 12/07/2019: ALT 30; BUN 12; Creatinine, Ser 0.81; Potassium 4.1; Sodium 143    Lipid Panel    Component Value Date/Time   CHOL 250 (H) 12/07/2019 1412   TRIG 113  12/07/2019 1412   HDL 54 12/07/2019 1412   CHOLHDL 4.6 (H) 12/07/2019 1412   LDLCALC 176 (H) 12/07/2019 1412      Wt Readings from Last 3 Encounters:  01/06/20 180 lb 3.2 oz (81.7 kg)  12/07/19 178 lb 12.8 oz (81.1 kg)  06/29/19 175 lb (79.4 kg)      ASSESSMENT AND PLAN:  # Chest pain:  Symptoms are very atypical and never with exertion.  However she does have risk factors.  We will get a coronary CT-a to better assess her coronaries and overall risk.  # Snoring: Concerning for OSA.  She was told that she had sleep apnea before but was unable to afford the dental device.  She  awakens herself with snoring.  We will get a sleep study.  This could be the source of all her symptoms.  # Mitral valve prolapse: Mild systolic murmur on exam.  I suspect this is not severe.  We will get an echocardiogram to confirm.  We discussed the fact that antibiotic prophylaxis is not indicated.  # Hypertension: Blood pressure better controlled.  Continue Edarbyclor.  # Hyperlipidemia:   She was just started on simvastatin this week.  Given her diabetes LDL goal is less than 70.  I suspect she will not get to goal with this.  She will need repeat lab work in 6 to 8 weeks.  Current medicines are reviewed at length with the patient today.  The patient does not have concerns regarding medicines.  The following changes have been made:  no change  Labs/ tests ordered today include:   Orders Placed This Encounter  Procedures  . CT CORONARY MORPH W/CTA COR W/SCORE W/CA W/CM &/OR WO/CM  . CT CORONARY FRACTIONAL FLOW RESERVE DATA PREP  . CT CORONARY FRACTIONAL FLOW RESERVE FLUID ANALYSIS  . Basic metabolic panel  . ECHOCARDIOGRAM COMPLETE  . Split night study     Disposition:   FU with Laken Lobato C. Oval Linsey, MD, [facility] in 6 weeks.   Signed, Daylan Boggess C. Oval Linsey, MD, [facility]  01/06/2020 6:02 PM    [facility]

## 2020-01-07 ENCOUNTER — Telehealth: Payer: Self-pay | Admitting: *Deleted

## 2020-01-07 NOTE — Telephone Encounter (Signed)
PA for in lab sleep study submitted to [employer] via web portal. 

## 2020-01-14 LAB — BASIC METABOLIC PANEL
BUN/Creatinine Ratio: 23 (ref 9–23)
BUN: 20 mg/dL (ref 6–24)
CO2: 22 mmol/L (ref 20–29)
Calcium: 9.9 mg/dL (ref 8.7–10.2)
Chloride: 102 mmol/L (ref 96–106)
Creatinine, Ser: 0.86 mg/dL (ref 0.57–1.00)
GFR calc Af Amer: 86 mL/min/{1.73_m2} (ref >59)
GFR calc non Af Amer: 74 mL/min/{1.73_m2} (ref >59)
Glucose: 114 mg/dL — ABNORMAL HIGH (ref 65–99)
Potassium: 3.8 mmol/L (ref 3.5–5.2)
Sodium: 141 mmol/L (ref 134–144)

## 2020-01-15 ENCOUNTER — Telehealth: Payer: Self-pay | Admitting: *Deleted

## 2020-01-15 ENCOUNTER — Other Ambulatory Visit: Payer: Self-pay | Admitting: Cardiovascular Disease

## 2020-01-15 DIAGNOSIS — R400 Somnolence: Secondary | ICD-10-CM

## 2020-01-15 DIAGNOSIS — R0683 Snoring: Secondary | ICD-10-CM

## 2020-01-15 DIAGNOSIS — I10 Essential (primary) hypertension: Secondary | ICD-10-CM

## 2020-01-15 NOTE — Telephone Encounter (Signed)
Left message to return a call to inform of sleep study appointment.

## 2020-01-20 ENCOUNTER — Other Ambulatory Visit: Payer: Self-pay

## 2020-01-20 ENCOUNTER — Ambulatory Visit (HOSPITAL_COMMUNITY): Payer: 59 | Attending: Cardiovascular Disease

## 2020-01-20 DIAGNOSIS — R079 Chest pain, unspecified: Secondary | ICD-10-CM

## 2020-01-20 DIAGNOSIS — R0681 Apnea, not elsewhere classified: Secondary | ICD-10-CM | POA: Diagnosis not present

## 2020-01-20 DIAGNOSIS — R0683 Snoring: Secondary | ICD-10-CM | POA: Diagnosis not present

## 2020-01-20 DIAGNOSIS — I341 Nonrheumatic mitral (valve) prolapse: Secondary | ICD-10-CM | POA: Insufficient documentation

## 2020-02-04 ENCOUNTER — Ambulatory Visit (INDEPENDENT_AMBULATORY_CARE_PROVIDER_SITE_OTHER): Payer: 59 | Admitting: Nurse Practitioner

## 2020-02-04 ENCOUNTER — Encounter: Payer: 59 | Admitting: Nurse Practitioner

## 2020-02-04 ENCOUNTER — Other Ambulatory Visit: Payer: Self-pay

## 2020-02-04 ENCOUNTER — Telehealth (HOSPITAL_COMMUNITY): Payer: Self-pay | Admitting: Emergency Medicine

## 2020-02-04 ENCOUNTER — Encounter: Payer: Self-pay | Admitting: Nurse Practitioner

## 2020-02-04 VITALS — BP 124/86 | HR 90 | Temp 98.3°F | Ht 62.2 in | Wt 175.6 lb

## 2020-02-04 DIAGNOSIS — Z0000 Encounter for general adult medical examination without abnormal findings: Secondary | ICD-10-CM

## 2020-02-04 DIAGNOSIS — I341 Nonrheumatic mitral (valve) prolapse: Secondary | ICD-10-CM | POA: Diagnosis not present

## 2020-02-04 DIAGNOSIS — R21 Rash and other nonspecific skin eruption: Secondary | ICD-10-CM | POA: Diagnosis not present

## 2020-02-04 DIAGNOSIS — I10 Essential (primary) hypertension: Secondary | ICD-10-CM | POA: Diagnosis not present

## 2020-02-04 DIAGNOSIS — E119 Type 2 diabetes mellitus without complications: Secondary | ICD-10-CM

## 2020-02-04 DIAGNOSIS — Z1231 Encounter for screening mammogram for malignant neoplasm of breast: Secondary | ICD-10-CM | POA: Diagnosis not present

## 2020-02-04 LAB — POCT URINALYSIS DIPSTICK
Bilirubin, UA: NEGATIVE
Blood, UA: NEGATIVE
Glucose, UA: NEGATIVE
Ketones, UA: NEGATIVE
Nitrite, UA: NEGATIVE
Protein, UA: NEGATIVE
Spec Grav, UA: >=1.03 — AB (ref 1.010–1.025)
Urobilinogen, UA: 0.2 E.U./dL
pH, UA: 5.5 (ref 5.0–8.0)

## 2020-02-04 LAB — POCT UA - MICROALBUMIN
Albumin/Creatinine Ratio, Urine, POC: 30
Creatinine, POC: 300 mg/dL
Microalbumin Ur, POC: 30 mg/L

## 2020-02-04 MED ORDER — EDARBYCLOR 40-12.5 MG PO TABS: 1.0000 | ORAL_TABLET | Freq: Every day | ORAL | 1 refills | Status: DC

## 2020-02-04 MED ORDER — CLINDAMYCIN HCL 300 MG PO CAPS: ORAL_CAPSULE | ORAL | 0 refills | Status: DC

## 2020-02-04 MED ORDER — MOMETASONE FUROATE 0.1 % EX CREA: TOPICAL_CREAM | CUTANEOUS | 1 refills | Status: DC

## 2020-02-04 NOTE — Telephone Encounter (Signed)
Reaching out to patient to offer assistance regarding upcoming cardiac imaging study; pt verbalizes understanding of appt date/time, parking situation and where to check in, pre-test NPO status and medications ordered, and verified current allergies; name and call back number provided for further questions should they arise Shruti Arrey RN Navigator Cardiac [facility] [phone] office [phone] cell 

## 2020-02-04 NOTE — Patient Instructions (Signed)
Health Maintenance, Female Adopting a healthy lifestyle and getting preventive care are important in promoting health and wellness. Ask your health care provider about:  The right schedule for you to have regular tests and exams.  Things you can do on your own to prevent diseases and keep yourself healthy. What should I know about diet, weight, and exercise? Eat a healthy diet   Eat a diet that includes plenty of vegetables, fruits, low-fat dairy products, and lean protein.  Do not eat a lot of foods that are high in solid fats, added sugars, or sodium. Maintain a healthy weight Body mass index (BMI) is used to identify weight problems. It estimates body fat based on height and weight. Your health care provider can help determine your BMI and help you achieve or maintain a healthy weight. Get regular exercise Get regular exercise. This is one of the most important things you can do for your health. Most adults should:  Exercise for at least 150 minutes each week. The exercise should increase your heart rate and make you sweat (moderate-intensity exercise).  Do strengthening exercises at least twice a week. This is in addition to the moderate-intensity exercise.  Spend less time sitting. Even light physical activity can be beneficial. Watch cholesterol and blood lipids Have your blood tested for lipids and cholesterol at 60 years of age, then have this test every 5 years. Have your cholesterol levels checked more often if:  Your lipid or cholesterol levels are high.  You are older than 60 years of age.  You are at high risk for heart disease. What should I know about cancer screening? Depending on your health history and family history, you may need to have cancer screening at various ages. This may include screening for:  Breast cancer.  Cervical cancer.  Colorectal cancer.  Skin cancer.  Lung cancer. What should I know about heart disease, diabetes, and high blood  pressure? Blood pressure and heart disease  High blood pressure causes heart disease and increases the risk of stroke. This is more likely to develop in people who have high blood pressure readings, are of African descent, or are overweight.  Have your blood pressure checked: ? Every 3-5 years if you are 18-39 years of age. ? Every year if you are 40 years old or older. Diabetes Have regular diabetes screenings. This checks your fasting blood sugar level. Have the screening done:  Once every three years after age 40 if you are at a normal weight and have a low risk for diabetes.  More often and at a younger age if you are overweight or have a high risk for diabetes. What should I know about preventing infection? Hepatitis B If you have a higher risk for hepatitis B, you should be screened for this virus. Talk with your health care provider to find out if you are at risk for hepatitis B infection. Hepatitis C Testing is recommended for:  Everyone born from 1945 through 1965.  Anyone with known risk factors for hepatitis C. Sexually transmitted infections (STIs)  Get screened for STIs, including gonorrhea and chlamydia, if: ? You are sexually active and are younger than 60 years of age. ? You are older than 60 years of age and your health care provider tells you that you are at risk for this type of infection. ? Your sexual activity has changed since you were last screened, and you are at increased risk for chlamydia or gonorrhea. Ask your health care provider if   you are at risk.  Ask your health care provider about whether you are at high risk for HIV. Your health care provider may recommend a prescription medicine to help prevent HIV infection. If you choose to take medicine to prevent HIV, you should first get tested for HIV. You should then be tested every 3 months for as long as you are taking the medicine. Pregnancy  If you are about to stop having your period (premenopausal) and  you may become pregnant, seek counseling before you get pregnant.  Take 400 to 800 micrograms (mcg) of folic acid every day if you become pregnant.  Ask for birth control (contraception) if you want to prevent pregnancy. Osteoporosis and menopause Osteoporosis is a disease in which the bones lose minerals and strength with aging. This can result in bone fractures. If you are 65 years old or older, or if you are at risk for osteoporosis and fractures, ask your health care provider if you should:  Be screened for bone loss.  Take a calcium or vitamin D supplement to lower your risk of fractures.  Be given hormone replacement therapy (HRT) to treat symptoms of menopause. Follow these instructions at home: Lifestyle  Do not use any products that contain nicotine or tobacco, such as cigarettes, e-cigarettes, and chewing tobacco. If you need help quitting, ask your health care provider.  Do not use street drugs.  Do not share needles.  Ask your health care provider for help if you need support or information about quitting drugs. Alcohol use  Do not drink alcohol if: ? Your health care provider tells you not to drink. ? You are pregnant, may be pregnant, or are planning to become pregnant.  If you drink alcohol: ? Limit how much you use to 0-1 drink a day. ? Limit intake if you are breastfeeding.  Be aware of how much alcohol is in your drink. In the [place] one drink equals one 12 oz bottle of beer (355 mL), one 5 oz glass of wine (148 mL), or one 1 oz glass of hard liquor (44 mL). General instructions  Schedule regular health, dental, and eye exams.  Stay current with your vaccines.  Tell your health care provider if: ? You often feel depressed. ? You have ever been abused or do not feel safe at home. Summary  Adopting a healthy lifestyle and getting preventive care are important in promoting health and wellness.  Follow your health care provider's instructions about healthy  diet, exercising, and getting tested or screened for diseases.  Follow your health care provider's instructions on monitoring your cholesterol and blood pressure. This information is not intended to replace advice given to you by your health care provider. Make sure you discuss any questions you have with your health care provider. Document Revised: 12/03/2018 Document Reviewed: 12/03/2018 Elsevier Patient Education  2020 Elsevier Inc.  

## 2020-02-04 NOTE — Progress Notes (Signed)
This visit occurred during the SARS-CoV-2 public health emergency.  Safety protocols were in place, including screening questions prior to the visit, additional usage of staff PPE, and extensive cleaning of exam room while observing appropriate contact time as indicated for disinfecting solutions.  Subjective:     Patient ID: Mary Holt , female    DOB: [birth year] , 60 y.o.   MRN: [id number]   Chief Complaint  Patient presents with  . Annual Exam    HPI  Here for HM.   Hypertension This is a chronic problem. The current episode started more than 1 year ago. The problem has been gradually improving since onset. The problem is controlled. Pertinent negatives include no anxiety, chest pain, headaches or palpitations. There are no associated agents to hypertension. Risk factors for coronary artery disease include dyslipidemia, sedentary lifestyle and obesity. Past treatments include angiotensin blockers. There are no compliance problems.  There is no history of angina. There is no history of chronic renal disease.    The patient states she is  post menopausal status Mammogram last done 10/13/2018.  Negative for: breast discharge, breast lump(s), breast pain and breast self exam.  Pertinent negatives include abnormal bleeding (hematology), anxiety, decreased libido, depression, difficulty falling sleep, dyspareunia, history of infertility, nocturia, sexual dysfunction, sleep disturbances, urinary incontinence, urinary urgency, vaginal discharge and vaginal itching. Diet regular, trying to eat more healthy. The patient states her exercise level is minimal.      The patient's tobacco use is:  Social History   Tobacco Use  Smoking Status Never Smoker  Smokeless Tobacco Never Used   She has been exposed to passive smoke. The patient's alcohol use is:  Social History   Substance and Sexual Activity  Alcohol Use No   Additional information: Last pap 02/02/2019 next one scheduled for  01/2022.    Past Medical History:  Diagnosis Date  . Chest pain of uncertain etiology 123XX123  . Diabetes mellitus without complication ([place])   . Heart murmur    asymptomatic  . Hyperlipidemia   . Hypertension   . Kidney stones   . Kidney stones    several times, last  one 2014  . Mitral valve prolapse   . MVP (mitral valve prolapse)   . Snoring 01/06/2020     Family History  Problem Relation Age of Onset  . Stroke Mother   . Heart attack Father   . Breast cancer Sister   . Heart attack Brother   . Ovarian cancer Sister   . Lymphoma Sister   . Heart attack Brother   . Colon cancer Neg Hx   . Colon polyps Neg Hx   . Esophageal cancer Neg Hx   . Rectal cancer Neg Hx   . Stomach cancer Neg Hx      Current Outpatient Medications:  .  Azilsartan-Chlorthalidone (EDARBYCLOR) 40-12.5 MG TABS, Take 1 tablet by mouth daily., Disp: 90 tablet, Rfl: 1 .  ibuprofen (ADVIL,MOTRIN) 200 MG tablet, Take 200 mg by mouth every 6 (six) hours as needed (for headaches or pain). , Disp: , Rfl:  .  metFORMIN (GLUCOPHAGE) 500 MG tablet, Take 1 tablet (500 mg total) by mouth 2 (two) times daily with a meal., Disp: 180 tablet, Rfl: 1 .  metoprolol tartrate (LOPRESSOR) 100 MG tablet, TAKE 1 TABLET 2 HOURS PRIOR TO CT, Disp: 1 tablet, Rfl: 0 .  simvastatin (ZOCOR) 10 MG tablet, Take 1 tablet (10 mg total) by mouth every evening., Disp: 90 tablet, Rfl: 1  Current Facility-Administered Medications:  .  0.9 %  sodium chloride infusion, 500 mL, Intravenous, Once, Thornton Park, MD   Allergies  Allergen Reactions  . Nabumetone Rash  . Penicillins Rash    Did it involve swelling of the face/tongue/throat, SOB, or low BP? Yes Did it involve sudden or severe rash/hives, skin peeling, or any reaction on the inside of your mouth or nose? No Did you need to seek medical attention at a hospital or doctor's office? No When did it last happen? >10 years ago If all above answers are "NO", may proceed  with cephalosporin use.      Review of Systems  Cardiovascular: Negative for chest pain and palpitations.  Neurological: Negative for headaches.     Today's Vitals   02/04/20 1000  BP: 124/86  Pulse: 90  Temp: 98.3 F (36.8 C)  TempSrc: Oral  Weight: 175 lb 9.6 oz (79.7 kg)  Height: 5' 2.2" (1.58 m)  PainSc: 0-No pain   Body mass index is 31.91 kg/m.   Objective:  Physical Exam Vitals reviewed.  Constitutional:      Appearance: Normal appearance. She is well-developed.  HENT:     Head: Normocephalic and atraumatic.     Right Ear: Hearing, tympanic membrane, ear canal and external ear normal. There is no impacted cerumen.     Left Ear: Hearing, tympanic membrane, ear canal and external ear normal. There is no impacted cerumen.     Nose: Nose normal. No congestion.     Mouth/Throat:     Mouth: Mucous membranes are moist.  Eyes:     General: Lids are normal.     Extraocular Movements: Extraocular movements intact.     Conjunctiva/sclera: Conjunctivae normal.     Pupils: Pupils are equal, round, and reactive to light.     Funduscopic exam:    Right eye: No papilledema.        Left eye: No papilledema.  Neck:     Thyroid: No thyroid mass.     Vascular: No carotid bruit.  Cardiovascular:     Rate and Rhythm: Normal rate and regular rhythm.     Pulses: Normal pulses.     Heart sounds: Normal heart sounds. No murmur.  Pulmonary:     Effort: Pulmonary effort is normal.     Breath sounds: Normal breath sounds.  Chest:     Breasts:        Right: Normal. No mass or tenderness.        Left: Normal. No mass or tenderness.  Abdominal:     General: Abdomen is flat. Bowel sounds are normal.     Palpations: Abdomen is soft.  Musculoskeletal:        General: No swelling. Normal range of motion.     Cervical back: Full passive range of motion without pain, normal range of motion and neck supple.     Right lower leg: No edema.     Left lower leg: No edema.   Lymphadenopathy:     Upper Body:     Right upper body: No supraclavicular adenopathy.     Left upper body: No supraclavicular adenopathy.  Skin:    General: Skin is warm and dry.     Capillary Refill: Capillary refill takes less than 2 seconds.  Neurological:     General: No focal deficit present.     Mental Status: She is alert and oriented to person, place, and time.     Cranial Nerves: No cranial nerve  deficit.     Sensory: No sensory deficit.  Psychiatric:        Mood and Affect: Mood normal.        Behavior: Behavior normal.        Thought Content: Thought content normal.        Judgment: Judgment normal.         Assessment And Plan:     1. Health maintenance examination . Behavior modifications discussed and diet history reviewed.   . Pt will continue to exercise regularly and modify diet with low GI, plant based foods and decrease intake of processed foods.  . Recommend intake of daily multivitamin, Vitamin D, and calcium.  . Recommend mammogram and colonoscopy for preventive screenings, as well as recommend immunizations that include influenza, TDAP  2. Encounter for screening mammogram for breast cancer  Pt instructed on Self Breast Exam.According to ACOG guidelines Women aged 37 and older are recommended to get an annual mammogram. Form completed and given to patient contact the [facility] for appointment scheduing.   Pt encouraged to get annual mammogram - MM DIGITAL SCREENING BILATERAL; Future  3. Mitral valve prolapse  History, she will need predental procedure antibiotic treatment - clindamycin (CLEOCIN) 300 MG capsule; Take 2 tabs by mouth 30-60 minutes before dental procedure  Dispense: 10 capsule; Refill: 0  4. Essential hypertension . B/P is well controlled.  . CMP ordered to check renal function.  . The importance of regular exercise and dietary modification was stressed to the patient.  - POCT Urinalysis Dipstick (81002) - POCT UA -  Microalbumin - Azilsartan-Chlorthalidone (EDARBYCLOR) 40-12.5 MG TABS; Take 1 tablet by mouth daily.  Dispense: 90 tablet; Refill: 1  5. Type 2 diabetes mellitus without complication, without long-term current use of insulin (HCC)  Chronic, stable  Continue with current medications  Encouraged to limit intake of sugary foods and drinks  Encouraged to increase physical activity to 150 minutes per week - POCT Urinalysis Dipstick (81002) - POCT UA - Microalbumin  6. Rash  This is long standing intermittent rash  Refilled her cream    [name], FNP    THE PATIENT IS ENCOURAGED TO PRACTICE SOCIAL DISTANCING DUE TO THE COVID-19 PANDEMIC.

## 2020-02-05 ENCOUNTER — Ambulatory Visit (HOSPITAL_COMMUNITY)
Admission: RE | Admit: 2020-02-05 | Discharge: 2020-02-05 | Disposition: A | Discharge status: Home / Self Care | Payer: 59 | Source: Ambulatory Visit | Attending: Cardiovascular Disease | Admitting: Cardiovascular Disease

## 2020-02-05 DIAGNOSIS — R0681 Apnea, not elsewhere classified: Secondary | ICD-10-CM | POA: Insufficient documentation

## 2020-02-05 DIAGNOSIS — R079 Chest pain, unspecified: Secondary | ICD-10-CM

## 2020-02-05 DIAGNOSIS — I2510 Atherosclerotic heart disease of native coronary artery without angina pectoris: Secondary | ICD-10-CM | POA: Diagnosis not present

## 2020-02-05 DIAGNOSIS — R0683 Snoring: Secondary | ICD-10-CM | POA: Diagnosis present

## 2020-02-05 DIAGNOSIS — I341 Nonrheumatic mitral (valve) prolapse: Secondary | ICD-10-CM | POA: Diagnosis present

## 2020-02-05 MED ORDER — IOHEXOL 350 MG/ML SOLN
100.0000 mL | Freq: Once | INTRAVENOUS | Status: AC | PRN
  Administered 2020-02-05: 100 mL via INTRAVENOUS

## 2020-02-05 MED ORDER — NITROGLYCERIN 0.4 MG SL SUBL: 0.8000 mg | SUBLINGUAL_TABLET | Freq: Once | SUBLINGUAL | Status: AC

## 2020-02-05 MED ORDER — NITROGLYCERIN 0.4 MG SL SUBL
SUBLINGUAL_TABLET | SUBLINGUAL | Status: AC
  Administered 2020-02-05: 0.8 mg via SUBLINGUAL
  Filled 2020-02-05: qty 2

## 2020-02-05 MED ORDER — METOPROLOL TARTRATE 5 MG/5ML IV SOLN
INTRAVENOUS | Status: AC
  Filled 2020-02-05: qty 10

## 2020-02-10 DIAGNOSIS — I2510 Atherosclerotic heart disease of native coronary artery without angina pectoris: Secondary | ICD-10-CM | POA: Diagnosis not present

## 2020-02-11 ENCOUNTER — Ambulatory Visit (HOSPITAL_COMMUNITY)
Admission: RE | Admit: 2020-02-11 | Discharge: 2020-02-11 | Disposition: A | Discharge status: Home / Self Care | Payer: 59 | Source: Ambulatory Visit | Attending: Cardiovascular Disease | Admitting: Cardiovascular Disease

## 2020-02-11 DIAGNOSIS — I341 Nonrheumatic mitral (valve) prolapse: Secondary | ICD-10-CM | POA: Diagnosis present

## 2020-02-11 DIAGNOSIS — R0683 Snoring: Secondary | ICD-10-CM | POA: Diagnosis present

## 2020-02-11 DIAGNOSIS — R079 Chest pain, unspecified: Secondary | ICD-10-CM | POA: Insufficient documentation

## 2020-02-11 DIAGNOSIS — R0681 Apnea, not elsewhere classified: Secondary | ICD-10-CM | POA: Insufficient documentation

## 2020-02-15 ENCOUNTER — Other Ambulatory Visit: Payer: Self-pay | Admitting: Nurse Practitioner

## 2020-02-15 ENCOUNTER — Encounter (HOSPITAL_BASED_OUTPATIENT_CLINIC_OR_DEPARTMENT_OTHER): Payer: 59 | Admitting: Cardiovascular Disease

## 2020-02-15 DIAGNOSIS — I10 Essential (primary) hypertension: Secondary | ICD-10-CM

## 2020-02-15 DIAGNOSIS — R21 Rash and other nonspecific skin eruption: Secondary | ICD-10-CM

## 2020-02-15 MED ORDER — MOMETASONE FUROATE 0.1 % EX CREA: TOPICAL_CREAM | CUTANEOUS | 2 refills | Status: AC

## 2020-02-16 ENCOUNTER — Telehealth: Payer: Self-pay | Admitting: Cardiovascular Disease

## 2020-02-16 ENCOUNTER — Other Ambulatory Visit: Payer: Self-pay | Admitting: *Deleted

## 2020-02-16 ENCOUNTER — Other Ambulatory Visit: Payer: Self-pay | Admitting: Cardiovascular Disease

## 2020-02-16 DIAGNOSIS — R931 Abnormal findings on diagnostic imaging of heart and coronary circulation: Secondary | ICD-10-CM

## 2020-02-16 DIAGNOSIS — I209 Angina pectoris, unspecified: Secondary | ICD-10-CM

## 2020-02-16 DIAGNOSIS — Z01812 Encounter for preprocedural laboratory examination: Secondary | ICD-10-CM

## 2020-02-16 MED ORDER — ASPIRIN EC 81 MG PO TBEC: 81.0000 mg | DELAYED_RELEASE_TABLET | Freq: Every day | ORAL | 3 refills | Status: AC

## 2020-02-16 NOTE — Telephone Encounter (Signed)
Spoke to patient, aware to start ASA 81 mg daily per Dr. Oval Linsey.    She is requesting cath be scheduled for 3/5.  Advised Covid test needed 3/2 and will need to quarantine until procedure.    Patient also will get lab work 3/2 before [employer].   Orders placed and covid test scheduled.    Patient also requesting note for work since she has to quarantine for 3 days.    She request to pick this up on Thursday 2/25.     Advised we will work on getting note completed for pick up and scheduling her procedure.   Advised will call tomorrow to discuss instructions and times.   Patient aware and request to be called around 12 noon.

## 2020-02-16 NOTE — Telephone Encounter (Signed)
Dr. Oval Linsey aware, will call patient.

## 2020-02-16 NOTE — Telephone Encounter (Signed)
Mary Holt is returning phone call in regards to CT results. She states the best way to contact her is on her home phone anytime up until 2:00 PM. She works second shift and leaves for work then. She states if she is unable to answer a detailed voicemail can be left.

## 2020-02-16 NOTE — Progress Notes (Signed)
Discussed abnormal coronary CT-A findings with patient.  She would like to proceed with heart cath.  Risks and benefits of cardiac catheterization have been discussed with the patient.  The patient understands that risks included but are not limited to stroke (1 in 1000), death (1 in 71), kidney failure [usually temporary] (1 in 500), bleeding (1 in 200), allergic reaction [possibly serious] (1 in 200). The patient understands and agrees to proceed.   Lenard Kampf C. Oval Linsey, MD, [facility]  02/16/2020 12:07 PM

## 2020-02-16 NOTE — Progress Notes (Signed)
sa

## 2020-02-18 ENCOUNTER — Telehealth: Payer: Self-pay | Admitting: Cardiovascular Disease

## 2020-02-18 NOTE — Telephone Encounter (Signed)
New message:    Patient calling stating that she is coming around 106 to pick a note up concering Tuesday threw Friday next week for work. Please call patient back.

## 2020-02-18 NOTE — Telephone Encounter (Signed)
Letter composed for patient to be excused from work from 3/2 - 3/5 d/t covid19 screening prior to 3/5 left heart cath

## 2020-02-18 NOTE — Telephone Encounter (Signed)
Patient scheduled with Dr Ellyn Hack at 7;30 Will review with patient at follow up visit

## 2020-02-22 ENCOUNTER — Telehealth (INDEPENDENT_AMBULATORY_CARE_PROVIDER_SITE_OTHER): Payer: 59 | Admitting: Cardiovascular Disease

## 2020-02-22 ENCOUNTER — Encounter: Payer: Self-pay | Admitting: Cardiovascular Disease

## 2020-02-22 ENCOUNTER — Other Ambulatory Visit: Payer: Self-pay

## 2020-02-22 VITALS — Ht 63.0 in

## 2020-02-22 DIAGNOSIS — I25119 Atherosclerotic heart disease of native coronary artery with unspecified angina pectoris: Secondary | ICD-10-CM | POA: Insufficient documentation

## 2020-02-22 DIAGNOSIS — I209 Angina pectoris, unspecified: Secondary | ICD-10-CM

## 2020-02-22 DIAGNOSIS — I2510 Atherosclerotic heart disease of native coronary artery without angina pectoris: Secondary | ICD-10-CM

## 2020-02-22 DIAGNOSIS — I10 Essential (primary) hypertension: Secondary | ICD-10-CM

## 2020-02-22 HISTORY — DX: Atherosclerotic heart disease of native coronary artery without angina pectoris: I25.10

## 2020-02-22 HISTORY — DX: Angina pectoris, unspecified: I20.9

## 2020-02-22 NOTE — Patient Instructions (Addendum)
Medication Instructions:  Your physician recommends that you continue on your current medications as directed. Please refer to the Current Medication list given to you today.  *If you need a refill on your cardiac medications before your next appointment, please call your pharmacy*   Lab Work: CBC/BMET 02/23/2020 PRIOR TO YOUR COVID TEST   COVID SCREENING 02/23/2020 AT 12:05 [place] [facility]  YOU WILL NEED TO QUARANTINE YOURSELF UNTIL AFTER YOUR CATH   If you have labs (blood work) drawn today and your tests are completely normal, you will receive your results only by: Marland Kitchen MyChart Message (if you have MyChart) OR . A paper copy in the mail If you have any lab test that is abnormal or we need to change your treatment, we will call you to review the results.  Testing/Procedures: Your physician has requested that you have a cardiac catheterization. Cardiac catheterization is used to diagnose and/or treat various heart conditions. Doctors may recommend this procedure for a number of different reasons. The most common reason is to evaluate chest pain. Chest pain can be a symptom of coronary artery disease (CAD), and cardiac catheterization can show whether plaque is narrowing or blocking your heart's arteries. This procedure is also used to evaluate the valves, as well as measure the blood flow and oxygen levels in different parts of your heart. For further information please visit [email or web address]. Please follow instruction sheet, as given.  Follow-Up: At [facility], you and your health needs are our priority.  As part of our continuing mission to provide you with exceptional heart care, we have created designated Provider Care Teams.  These Care Teams include your primary Cardiologist (physician) and Advanced Practice Providers (APPs -  Physician Assistants and Nurse Practitioners) who all work together to provide you with the care you need, when you need it.  We recommend signing up for  the patient portal called "MyChart".  Sign up information is provided on this After Visit Summary.  MyChart is used to connect with patients for Virtual Visits (Telemedicine).  Patients are able to view lab/test results, encounter notes, upcoming appointments, etc.  Non-urgent messages can be sent to your provider as well.   To learn more about what you can do with MyChart, go to [email or web address].    Your next appointment:    05/03/2020 at 8:40 with Dr Oval Linsey    Other Instructions     [facility] CARDIOVASCULAR DIVISION [facility] [place] [address] Dept: [phone] Loc: [place]  02/22/2020  You are scheduled for a Cardiac Catheterization on Friday, March 5 with Dr. Glenetta Hew.  1. Please arrive at the [facility] (Main Entrance A) at [facility]: [address] [place], [place] [address] at 5:30 AM (This time is two hours before your procedure to ensure your preparation). Free valet parking service is available.   Special note: Every effort is made to have your procedure done on time. Please understand that emergencies sometimes delay scheduled procedures.  2. Diet: Do not eat solid foods after midnight.  The patient may have clear liquids until 5am upon the day of the procedure.  3. Labs: You will need to have blood drawn on Tuesday, March 2 at [place]  Open: [place] (Lunch 12:30 - 1:30)   Phone: [phone]. You do not need to be fasting.  4. Medication instructions in preparation for your procedure:  Contrast Allergy: No  Hold your Edarbyclor morning of procedure   Do not take Diabetes Med Glucophage (Metformin) on the day of the procedure and HOLD 48 HOURS AFTER THE PROCEDURE.  On the morning of your procedure, take your Aspirin and any morning medicines NOT listed above.  You may use sips of water.  5. Plan for one  night stay--bring personal belongings. 6. Bring a current list of your medications and current insurance cards. 7. You MUST have a responsible person to drive you home. 8. Someone MUST be with you the first 24 hours after you arrive home or your discharge will be delayed. 9. Please wear clothes that are easy to get on and off and wear slip-on shoes.  Thank you for allowing [place] to care for you!   -- [place] [facility]

## 2020-02-22 NOTE — H&P (View-Only) (Signed)
Cardiology Office Note   Date:  02/22/2020   ID:  NANINE DYSINGER, DOB 02-17-1961, MRN [id number]  PCP:  [name], FNP  Cardiologist:   Skeet Latch, MD   No chief complaint on file.    History of Present Illness: LORE LOPEZ is a 59 y.o. female with CAD, hypertension, diabetes,hyperlipidemia, and mitral valve prolapse here for follow up.  She was initially see for hypertension 12/2019.  Ms. Ran reported that when she was a young child she had episodes of chest pain.  She was evaluated and found to have mitral valve prolapse.  She previously took antibiotics prior to going to the dentist and developed a rash.  She was subsequently told that she no longer needed to take antibiotics.  At that appointment she reported episodes of chest pain that were occurring mostly when she lays down or with bending in certain positions.  However it also seems to be associated with stress.  Lately she has noticed that when working.  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  She does not get much formal exercise.  She was referred for coronary CT-a 01/2020 that revealed 25 to 49% ostial LAD disease followed by greater than 70% stenosis in ostial D1.  FFR was abnormal in this area.  She consented to left heart catheterization but presents today given that her last appointment was over 1 month ago.  She does have a history of GERD but feels that this has been well-controlled lately.  She continues to have chest pain.   She notices it in the center of her chest and it lasts for hours.  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 There is no associated shortness of breath but she does have diaphoresis but no nausea.  She was referred for a sleep study.  She saw [name].  At that time her blood pressure was poorly-controlled.  She was started on his losartan/chlorthalidone.  Olmesartan was discontinued.  She was also started on simvastatin.  Since that time her blood pressure has  been better-controlled.  Due to her blood pressure and reports of chest discomfort she was referred to cardiology for further evaluation.    Past Medical History:  Diagnosis Date  . Angina pectoris ([place]) 02/22/2020  . CAD in native artery 02/22/2020  . Chest pain of uncertain etiology 123XX123  . Diabetes mellitus without complication ([place])   . Heart murmur    asymptomatic  . Hyperlipidemia   . Hypertension   . Kidney stones   . Kidney stones    several times, last  one 2014  . Mitral valve prolapse   . MVP (mitral valve prolapse)   . Snoring 01/06/2020    Past Surgical History:  Procedure Laterality Date  . BACK SURGERY     x2  . BREAST SURGERY     cyst right breast  . CARPAL TUNNEL RELEASE Bilateral   . DILATATION & CURRETTAGE/HYSTEROSCOPY WITH RESECTOCOPE N/A 08/11/2013   Procedure: DILATATION & CURETTAGE/HYSTEROSCOPY WITH RESECTOCOPE;  Surgeon: Marvene Staff, MD;  Location: [facility];  Service: Gynecology;  Laterality: N/A;  . TONSILLECTOMY    . TRIGGER FINGER RELEASE     left thumb  . TUBAL LIGATION       Current Outpatient Medications  Medication Sig Dispense Refill  . aspirin EC 81 MG tablet Take 1 tablet (81 mg total) by mouth daily. 90 tablet 3  . Azilsartan-Chlorthalidone (EDARBYCLOR) 40-12.5 MG TABS Take  1 tablet by mouth daily. 90 tablet 1  . clindamycin (CLEOCIN) 300 MG capsule Take 2 tabs by mouth 30-60 minutes before dental procedure 10 capsule 0  . metFORMIN (GLUCOPHAGE) 500 MG tablet Take 1 tablet (500 mg total) by mouth 2 (two) times daily with a meal. 180 tablet 1  . mometasone (ELOCON) 0.1 % cream Apply to affected area daily (Patient taking differently: Apply 1 application topically at bedtime. ) 45 g 2  . simvastatin (ZOCOR) 10 MG tablet Take 1 tablet (10 mg total) by mouth every evening. 90 tablet 1   Current Facility-Administered Medications  Medication Dose Route Frequency Provider Last Rate Last Admin  . 0.9 %  sodium chloride infusion  500 mL  Intravenous Once Thornton Park, MD        Allergies:   Nabumetone and Penicillins    Social History:  The patient  reports that she has never smoked. She has never used smokeless tobacco. She reports that she does not drink alcohol or use drugs.   Family History:  The patient's family history includes Breast cancer in her sister; Heart attack in her brother, brother, and father; Lymphoma in her sister; Ovarian cancer in her sister; Stroke in her mother.    ROS:  Please see the history of present illness.   Otherwise, review of systems are positive for none.   All other systems are reviewed and negative.    PHYSICAL EXAM: VS:  Ht 5\' 3"  (1.6 m)   LMP 09/30/2014   BMI 31.11 kg/m  , BMI Body mass index is 31.11 kg/m. GENERAL:  Sounds well HEENT:  Pupils equal round and reactive, fundi not visualized, oral mucosa unremarkable LUNGS:  Respirations unlabored NEURO: Speech fluent PSYCH:  Cognitively intact, oriented to person place and time   EKG:  EKG is ordered today. The ekg ordered today demonstrates sinus rhythm 99 bpm.  Cannot rule out prior inferior infarct.  Echo 01/20/20:   1. Left ventricular ejection fraction, by visual estimation, is 60 to  65%. The left ventricle has normal function. There is no left ventricular  hypertrophy.  2. The left ventricle has no regional wall motion abnormalities.  3. Global right ventricle has normal systolic function.The right  ventricular size is normal. No increase in right ventricular wall  thickness.  4. Left atrial size was normal.  5. Right atrial size was normal.  6. The mitral valve is normal in structure. Trivial mitral valve  regurgitation. No evidence of mitral stenosis.  7. Prolapse not appreciated.  8. The tricuspid valve is normal in structure.  9. The tricuspid valve is normal in structure. Tricuspid valve  regurgitation is trivial.  10. The aortic valve is normal in structure. Aortic valve regurgitation is    not visualized. No evidence of aortic valve sclerosis or stenosis.  11. The pulmonic valve was normal in structure. Pulmonic valve  regurgitation is trivial.    Recent Labs: 12/07/2019: ALT 30 01/14/2020: BUN 20; Creatinine, Ser 0.86; Potassium 3.8; Sodium 141    Lipid Panel    Component Value Date/Time   CHOL 250 (H) 12/07/2019 1412   TRIG 113 12/07/2019 1412   HDL 54 12/07/2019 1412   CHOLHDL 4.6 (H) 12/07/2019 1412   LDLCALC 176 (H) 12/07/2019 1412      Wt Readings from Last 3 Encounters:  02/04/20 175 lb 9.6 oz (79.7 kg)  01/06/20 180 lb 3.2 oz (81.7 kg)  12/07/19 178 lb 12.8 oz (81.1 kg)  ASSESSMENT AND PLAN:  # Angina:  # CAD: # Hyperlipidemia:  Symptoms are very atypical and never with exertion.  However coronary CT-A was consistent with ostial D1 disease.  She is amenable to undergoing cardiac catheterization.  Continue aspirin.  She was started on simvastatin.  LDL goal is now <70.  Will switch to rosuvastatin 40mg .  Check lipids/CMP in 3 months.   Risks and benefits of cardiac catheterization have been discussed with the patient.  The patient understands that risks included but are not limited to stroke (1 in 1000), death (1 in 45), kidney failure [usually temporary] (1 in 500), bleeding (1 in 200), allergic reaction [possibly serious] (1 in 200). The patient understands and agrees to proceed.   # Snoring: Sleep study pending.  # Mitral valve prolapse: Mild systolic murmur on exam.  No MVP on echo 12/2019.  # Hypertension: Blood pressure better controlled.  Continue Edarbyclor.    She was just started on simvastatin this week.  Given her diabetes LDL goal is less than 70.  I suspect she will not get to goal with this.  She will need repeat lab work in 6 to 8 weeks.  Current medicines are reviewed at length with the patient today.  The patient does not have concerns regarding medicines.  The following changes have been made:  no change  Labs/ tests  ordered today include:   No orders of the defined types were placed in this encounter.    Disposition:   FU with Kirsta Probert C. Oval Linsey, MD, [facility] in 2 months   Signed, Khiley Lieser C. Oval Linsey, MD, [facility]  02/22/2020 12:50 PM    [place]

## 2020-02-22 NOTE — Progress Notes (Signed)
Cardiology Office Note   Date:  02/22/2020   ID:  Mary Holt, DOB 02/01/1961, MRN [id number]  PCP:  [name], FNP  Cardiologist:   Skeet Latch, MD   No chief complaint on file.    History of Present Illness: Mary Holt is a 59 y.o. female with CAD, hypertension, diabetes,hyperlipidemia, and mitral valve prolapse here for follow up.  She was initially see for hypertension 12/2019.  Ms. Wences reported that when she was a young child she had episodes of chest pain.  She was evaluated and found to have mitral valve prolapse.  She previously took antibiotics prior to going to the dentist and developed a rash.  She was subsequently told that she no longer needed to take antibiotics.  At that appointment she reported episodes of chest pain that were occurring mostly when she lays down or with bending in certain positions.  However it also seems to be associated with stress.  Lately she has noticed that when working.  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  She does not get much formal exercise.  She was referred for coronary CT-a 01/2020 that revealed 25 to 49% ostial LAD disease followed by greater than 70% stenosis in ostial D1.  FFR was abnormal in this area.  She consented to left heart catheterization but presents today given that her last appointment was over 1 month ago.  She does have a history of GERD but feels that this has been well-controlled lately.  She continues to have chest pain.   She notices it in the center of her chest and it lasts for hours.  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 There is no associated shortness of breath but she does have diaphoresis but no nausea.  She was referred for a sleep study.  She saw [name].  At that time her blood pressure was poorly-controlled.  She was started on his losartan/chlorthalidone.  Olmesartan was discontinued.  She was also started on simvastatin.  Since that time her blood pressure has  been better-controlled.  Due to her blood pressure and reports of chest discomfort she was referred to cardiology for further evaluation.    Past Medical History:  Diagnosis Date  . Angina pectoris ([place]) 02/22/2020  . CAD in native artery 02/22/2020  . Chest pain of uncertain etiology 123XX123  . Diabetes mellitus without complication ([place])   . Heart murmur    asymptomatic  . Hyperlipidemia   . Hypertension   . Kidney stones   . Kidney stones    several times, last  one 2014  . Mitral valve prolapse   . MVP (mitral valve prolapse)   . Snoring 01/06/2020    Past Surgical History:  Procedure Laterality Date  . BACK SURGERY     x2  . BREAST SURGERY     cyst right breast  . CARPAL TUNNEL RELEASE Bilateral   . DILATATION & CURRETTAGE/HYSTEROSCOPY WITH RESECTOCOPE N/A 08/11/2013   Procedure: DILATATION & CURETTAGE/HYSTEROSCOPY WITH RESECTOCOPE;  Surgeon: Marvene Staff, MD;  Location: [facility];  Service: Gynecology;  Laterality: N/A;  . TONSILLECTOMY    . TRIGGER FINGER RELEASE     left thumb  . TUBAL LIGATION       Current Outpatient Medications  Medication Sig Dispense Refill  . aspirin EC 81 MG tablet Take 1 tablet (81 mg total) by mouth daily. 90 tablet 3  . Azilsartan-Chlorthalidone (EDARBYCLOR) 40-12.5 MG TABS Take  1 tablet by mouth daily. 90 tablet 1  . clindamycin (CLEOCIN) 300 MG capsule Take 2 tabs by mouth 30-60 minutes before dental procedure 10 capsule 0  . metFORMIN (GLUCOPHAGE) 500 MG tablet Take 1 tablet (500 mg total) by mouth 2 (two) times daily with a meal. 180 tablet 1  . mometasone (ELOCON) 0.1 % cream Apply to affected area daily (Patient taking differently: Apply 1 application topically at bedtime. ) 45 g 2  . simvastatin (ZOCOR) 10 MG tablet Take 1 tablet (10 mg total) by mouth every evening. 90 tablet 1   Current Facility-Administered Medications  Medication Dose Route Frequency Provider Last Rate Last Admin  . 0.9 %  sodium chloride infusion  500 mL  Intravenous Once Thornton Park, MD        Allergies:   Nabumetone and Penicillins    Social History:  The patient  reports that she has never smoked. She has never used smokeless tobacco. She reports that she does not drink alcohol or use drugs.   Family History:  The patient's family history includes Breast cancer in her sister; Heart attack in her brother, brother, and father; Lymphoma in her sister; Ovarian cancer in her sister; Stroke in her mother.    ROS:  Please see the history of present illness.   Otherwise, review of systems are positive for none.   All other systems are reviewed and negative.    PHYSICAL EXAM: VS:  Ht 5\' 3"  (1.6 m)   LMP 09/30/2014   BMI 31.11 kg/m  , BMI Body mass index is 31.11 kg/m. GENERAL:  Sounds well HEENT:  Pupils equal round and reactive, fundi not visualized, oral mucosa unremarkable LUNGS:  Respirations unlabored NEURO: Speech fluent PSYCH:  Cognitively intact, oriented to person place and time   EKG:  EKG is ordered today. The ekg ordered today demonstrates sinus rhythm 99 bpm.  Cannot rule out prior inferior infarct.  Echo 01/20/20:   1. Left ventricular ejection fraction, by visual estimation, is 60 to  65%. The left ventricle has normal function. There is no left ventricular  hypertrophy.  2. The left ventricle has no regional wall motion abnormalities.  3. Global right ventricle has normal systolic function.The right  ventricular size is normal. No increase in right ventricular wall  thickness.  4. Left atrial size was normal.  5. Right atrial size was normal.  6. The mitral valve is normal in structure. Trivial mitral valve  regurgitation. No evidence of mitral stenosis.  7. Prolapse not appreciated.  8. The tricuspid valve is normal in structure.  9. The tricuspid valve is normal in structure. Tricuspid valve  regurgitation is trivial.  10. The aortic valve is normal in structure. Aortic valve regurgitation is    not visualized. No evidence of aortic valve sclerosis or stenosis.  11. The pulmonic valve was normal in structure. Pulmonic valve  regurgitation is trivial.    Recent Labs: 12/07/2019: ALT 30 01/14/2020: BUN 20; Creatinine, Ser 0.86; Potassium 3.8; Sodium 141    Lipid Panel    Component Value Date/Time   CHOL 250 (H) 12/07/2019 1412   TRIG 113 12/07/2019 1412   HDL 54 12/07/2019 1412   CHOLHDL 4.6 (H) 12/07/2019 1412   LDLCALC 176 (H) 12/07/2019 1412      Wt Readings from Last 3 Encounters:  02/04/20 175 lb 9.6 oz (79.7 kg)  01/06/20 180 lb 3.2 oz (81.7 kg)  12/07/19 178 lb 12.8 oz (81.1 kg)  ASSESSMENT AND PLAN:  # Angina:  # CAD: # Hyperlipidemia:  Symptoms are very atypical and never with exertion.  However coronary CT-A was consistent with ostial D1 disease.  She is amenable to undergoing cardiac catheterization.  Continue aspirin.  She was started on simvastatin.  LDL goal is now <70.  Will switch to rosuvastatin 40mg .  Check lipids/CMP in 3 months.   Risks and benefits of cardiac catheterization have been discussed with the patient.  The patient understands that risks included but are not limited to stroke (1 in 1000), death (1 in 22), kidney failure [usually temporary] (1 in 500), bleeding (1 in 200), allergic reaction [possibly serious] (1 in 200). The patient understands and agrees to proceed.   # Snoring: Sleep study pending.  # Mitral valve prolapse: Mild systolic murmur on exam.  No MVP on echo 12/2019.  # Hypertension: Blood pressure better controlled.  Continue Edarbyclor.    She was just started on simvastatin this week.  Given her diabetes LDL goal is less than 70.  I suspect she will not get to goal with this.  She will need repeat lab work in 6 to 8 weeks.  Current medicines are reviewed at length with the patient today.  The patient does not have concerns regarding medicines.  The following changes have been made:  no change  Labs/ tests  ordered today include:   No orders of the defined types were placed in this encounter.    Disposition:   FU with Libi Corso C. Oval Linsey, MD, [facility] in 2 months   Signed, Hibah Odonnell C. Oval Linsey, MD, [facility]  02/22/2020 12:50 PM    [place]

## 2020-02-23 ENCOUNTER — Other Ambulatory Visit (HOSPITAL_COMMUNITY)
Admission: RE | Admit: 2020-02-23 | Discharge: 2020-02-23 | Disposition: A | Discharge status: Home / Self Care | Payer: 59 | Source: Ambulatory Visit | Attending: Cardiology | Admitting: Cardiology

## 2020-02-23 ENCOUNTER — Other Ambulatory Visit: Payer: Self-pay

## 2020-02-23 DIAGNOSIS — I209 Angina pectoris, unspecified: Secondary | ICD-10-CM

## 2020-02-23 DIAGNOSIS — Z20822 Contact with and (suspected) exposure to covid-19: Secondary | ICD-10-CM | POA: Diagnosis not present

## 2020-02-23 DIAGNOSIS — R931 Abnormal findings on diagnostic imaging of heart and coronary circulation: Secondary | ICD-10-CM

## 2020-02-23 DIAGNOSIS — Z01812 Encounter for preprocedural laboratory examination: Secondary | ICD-10-CM

## 2020-02-23 LAB — BASIC METABOLIC PANEL
BUN/Creatinine Ratio: 25 — ABNORMAL HIGH (ref 9–23)
BUN: 21 mg/dL (ref 6–24)
CO2: 23 mmol/L (ref 20–29)
Calcium: 9.8 mg/dL (ref 8.7–10.2)
Chloride: 100 mmol/L (ref 96–106)
Creatinine, Ser: 0.83 mg/dL (ref 0.57–1.00)
GFR calc Af Amer: 89 mL/min/{1.73_m2} (ref >59)
GFR calc non Af Amer: 77 mL/min/{1.73_m2} (ref >59)
Glucose: 121 mg/dL — ABNORMAL HIGH (ref 65–99)
Potassium: 4.2 mmol/L (ref 3.5–5.2)
Sodium: 139 mmol/L (ref 134–144)

## 2020-02-23 LAB — CBC
Hematocrit: 38.1 % (ref 34.0–46.6)
Hemoglobin: 12.8 g/dL (ref 11.1–15.9)
MCH: 28.8 pg (ref 26.6–33.0)
MCHC: 33.6 g/dL (ref 31.5–35.7)
MCV: 86 fL (ref 79–97)
Platelets: 361 10*3/uL (ref 150–450)
RBC: 4.44 x10E6/uL (ref 3.77–5.28)
RDW: 12.9 % (ref 11.7–15.4)
WBC: 8.3 10*3/uL (ref 3.4–10.8)

## 2020-02-23 LAB — SARS CORONAVIRUS 2 (TAT 6-24 HRS): SARS Coronavirus 2: NEGATIVE

## 2020-02-25 ENCOUNTER — Telehealth: Payer: Self-pay | Admitting: *Deleted

## 2020-02-25 NOTE — Telephone Encounter (Signed)
Pt contacted pre-catheterization scheduled at [facility] for: Friday February 26, 2020 7:30 AM Verified arrival time and place: [place] [facility]) at: 5:30 AM   No solid food after midnight prior to cath, clear liquids until 5 AM day of procedure. Contrast allergy: no  Hold: Metformin-day of procedure and 48 hours after the procedure. Edarbyclor-AM of procedure  Except hold medications AM meds can be  taken pre-cath with sip of water including: ASA 81 mg   Confirmed patient has responsible adult to drive home post procedure and observe 24 hours after arriving home: yes  Currently, due to Covid-19 pandemic, only one person will be allowed with patient. Must be the same person for patient's entire stay and will be required to wear a mask. They will be asked to wait in the waiting room for the duration of the patient's stay.  Patients are required to wear a mask when they enter the hospital.     COVID-19 Pre-Screening Questions:  . In the past 7 to 10 days have you had a cough,  shortness of breath, headache, congestion, fever (100 or greater) body aches, chills, sore throat, or sudden loss of taste or sense of smell? no . Have you been around anyone with known Covid 19 in the past 7-10 days? no . Have you been around anyone who is awaiting Covid 19 test results in the past 7 to 10 days? no . Have you been around anyone who has been exposed to Covid 19, or has mentioned symptoms of Covid 19 within the past 7 to 10 days? no   I reviewed procedure/mask/visitor instructions, COVID-19 screening questions with patient, she verbalized understanding, thanked me for call.

## 2020-02-26 ENCOUNTER — Ambulatory Visit (HOSPITAL_COMMUNITY)
Admission: RE | Admit: 2020-02-26 | Discharge: 2020-02-26 | Disposition: A | Discharge status: Home / Self Care | Payer: 59 | Attending: Cardiology | Admitting: Cardiology

## 2020-02-26 ENCOUNTER — Encounter (HOSPITAL_COMMUNITY)
Admission: RE | Disposition: A | Discharge status: Home / Self Care | Payer: Self-pay | Source: Home / Self Care | Attending: Cardiology

## 2020-02-26 ENCOUNTER — Telehealth: Payer: Self-pay

## 2020-02-26 ENCOUNTER — Other Ambulatory Visit: Payer: Self-pay

## 2020-02-26 DIAGNOSIS — E119 Type 2 diabetes mellitus without complications: Secondary | ICD-10-CM | POA: Insufficient documentation

## 2020-02-26 DIAGNOSIS — I341 Nonrheumatic mitral (valve) prolapse: Secondary | ICD-10-CM | POA: Insufficient documentation

## 2020-02-26 DIAGNOSIS — I10 Essential (primary) hypertension: Secondary | ICD-10-CM | POA: Insufficient documentation

## 2020-02-26 DIAGNOSIS — R079 Chest pain, unspecified: Secondary | ICD-10-CM | POA: Diagnosis present

## 2020-02-26 DIAGNOSIS — E785 Hyperlipidemia, unspecified: Secondary | ICD-10-CM | POA: Diagnosis not present

## 2020-02-26 DIAGNOSIS — I119 Hypertensive heart disease without heart failure: Secondary | ICD-10-CM | POA: Diagnosis present

## 2020-02-26 DIAGNOSIS — I209 Angina pectoris, unspecified: Secondary | ICD-10-CM

## 2020-02-26 DIAGNOSIS — R0683 Snoring: Secondary | ICD-10-CM | POA: Diagnosis not present

## 2020-02-26 DIAGNOSIS — R931 Abnormal findings on diagnostic imaging of heart and coronary circulation: Secondary | ICD-10-CM | POA: Diagnosis present

## 2020-02-26 DIAGNOSIS — Z6831 Body mass index (BMI) 31.0-31.9, adult: Secondary | ICD-10-CM

## 2020-02-26 DIAGNOSIS — Z7982 Long term (current) use of aspirin: Secondary | ICD-10-CM | POA: Insufficient documentation

## 2020-02-26 DIAGNOSIS — K219 Gastro-esophageal reflux disease without esophagitis: Secondary | ICD-10-CM | POA: Insufficient documentation

## 2020-02-26 DIAGNOSIS — Z79899 Other long term (current) drug therapy: Secondary | ICD-10-CM | POA: Diagnosis not present

## 2020-02-26 DIAGNOSIS — Z6832 Body mass index (BMI) 32.0-32.9, adult: Secondary | ICD-10-CM

## 2020-02-26 DIAGNOSIS — I25119 Atherosclerotic heart disease of native coronary artery with unspecified angina pectoris: Secondary | ICD-10-CM | POA: Diagnosis present

## 2020-02-26 DIAGNOSIS — Z880 Allergy status to penicillin: Secondary | ICD-10-CM | POA: Insufficient documentation

## 2020-02-26 DIAGNOSIS — Z7984 Long term (current) use of oral hypoglycemic drugs: Secondary | ICD-10-CM | POA: Insufficient documentation

## 2020-02-26 DIAGNOSIS — Z955 Presence of coronary angioplasty implant and graft: Secondary | ICD-10-CM

## 2020-02-26 DIAGNOSIS — R7303 Prediabetes: Secondary | ICD-10-CM | POA: Diagnosis present

## 2020-02-26 HISTORY — PX: LEFT HEART CATH AND CORONARY ANGIOGRAPHY: CATH118249

## 2020-02-26 HISTORY — PX: CORONARY STENT INTERVENTION: CATH118234

## 2020-02-26 LAB — GLUCOSE, CAPILLARY
Glucose-Capillary: 125 mg/dL — ABNORMAL HIGH (ref 70–99)
Glucose-Capillary: 112 mg/dL — ABNORMAL HIGH (ref 70–99)

## 2020-02-26 LAB — POCT ACTIVATED CLOTTING TIME
Activated Clotting Time: 241 seconds
Activated Clotting Time: 307 seconds
Activated Clotting Time: 312 seconds

## 2020-02-26 SURGERY — LEFT HEART CATH AND CORONARY ANGIOGRAPHY
Anesthesia: LOCAL

## 2020-02-26 MED ORDER — IOHEXOL 350 MG/ML SOLN
INTRAVENOUS | Status: DC | PRN
  Administered 2020-02-26: 150 mL

## 2020-02-26 MED ORDER — CLOPIDOGREL BISULFATE 300 MG PO TABS
ORAL_TABLET | ORAL | Status: AC
  Filled 2020-02-26: qty 2

## 2020-02-26 MED ORDER — ONDANSETRON HCL 4 MG/2ML IJ SOLN: 4.0000 mg | Freq: Four times a day (QID) | INTRAMUSCULAR | Status: DC | PRN

## 2020-02-26 MED ORDER — AZILSARTAN-CHLORTHALIDONE 40-12.5 MG PO TABS: 1.0000 | ORAL_TABLET | Freq: Every day | ORAL | Status: DC

## 2020-02-26 MED ORDER — CLOPIDOGREL BISULFATE 300 MG PO TABS
ORAL_TABLET | ORAL | Status: DC | PRN
  Administered 2020-02-26: 600 mg via ORAL

## 2020-02-26 MED ORDER — SODIUM CHLORIDE 0.9% FLUSH: 3.0000 mL | INTRAVENOUS | Status: DC | PRN

## 2020-02-26 MED ORDER — SODIUM CHLORIDE 0.9 % WEIGHT BASED INFUSION
3.0000 mL/kg/h | INTRAVENOUS | Status: AC
  Administered 2020-02-26: 3 mL/kg/h via INTRAVENOUS

## 2020-02-26 MED ORDER — NITROGLYCERIN 1 MG/10 ML FOR IR/CATH LAB
INTRA_ARTERIAL | Status: AC
  Filled 2020-02-26: qty 10

## 2020-02-26 MED ORDER — VERAPAMIL HCL 2.5 MG/ML IV SOLN
INTRAVENOUS | Status: AC
  Filled 2020-02-26: qty 2

## 2020-02-26 MED ORDER — HEPARIN SODIUM (PORCINE) 1000 UNIT/ML IJ SOLN
INTRAMUSCULAR | Status: AC
  Filled 2020-02-26: qty 1

## 2020-02-26 MED ORDER — ROSUVASTATIN CALCIUM 40 MG PO TABS: 40.0000 mg | ORAL_TABLET | Freq: Every day | ORAL | 3 refills | Status: DC

## 2020-02-26 MED ORDER — MIDAZOLAM HCL 2 MG/2ML IJ SOLN
INTRAMUSCULAR | Status: AC
  Filled 2020-02-26: qty 2

## 2020-02-26 MED ORDER — ASPIRIN 81 MG PO CHEW: 81.0000 mg | CHEWABLE_TABLET | ORAL | Status: DC

## 2020-02-26 MED ORDER — NITROGLYCERIN 1 MG/10 ML FOR IR/CATH LAB
INTRA_ARTERIAL | Status: DC | PRN
  Administered 2020-02-26 (×4): 200 ug via INTRACORONARY

## 2020-02-26 MED ORDER — HEPARIN (PORCINE) IN NACL 1000-0.9 UT/500ML-% IV SOLN
INTRAVENOUS | Status: AC
  Filled 2020-02-26: qty 1000

## 2020-02-26 MED ORDER — FENTANYL CITRATE (PF) 100 MCG/2ML IJ SOLN
INTRAMUSCULAR | Status: AC
  Filled 2020-02-26: qty 2

## 2020-02-26 MED ORDER — SODIUM CHLORIDE 0.9 % IV SOLN: 250.0000 mL | INTRAVENOUS | Status: DC | PRN

## 2020-02-26 MED ORDER — ROSUVASTATIN CALCIUM 20 MG PO TABS: 40.0000 mg | ORAL_TABLET | Freq: Every day | ORAL | Status: DC

## 2020-02-26 MED ORDER — VERAPAMIL HCL 2.5 MG/ML IV SOLN
INTRAVENOUS | Status: DC | PRN
  Administered 2020-02-26: 10 mL via INTRA_ARTERIAL

## 2020-02-26 MED ORDER — SODIUM CHLORIDE 0.9% FLUSH: 3.0000 mL | Freq: Two times a day (BID) | INTRAVENOUS | Status: DC

## 2020-02-26 MED ORDER — SODIUM CHLORIDE 0.9 % IV SOLN: INTRAVENOUS | Status: DC

## 2020-02-26 MED ORDER — LIDOCAINE HCL (PF) 1 % IJ SOLN
INTRAMUSCULAR | Status: AC
  Filled 2020-02-26: qty 30

## 2020-02-26 MED ORDER — LABETALOL HCL 5 MG/ML IV SOLN: 10.0000 mg | INTRAVENOUS | Status: DC | PRN

## 2020-02-26 MED ORDER — MIDAZOLAM HCL 2 MG/2ML IJ SOLN
INTRAMUSCULAR | Status: DC | PRN
  Administered 2020-02-26: 1 mg via INTRAVENOUS
  Administered 2020-02-26: 2 mg via INTRAVENOUS

## 2020-02-26 MED ORDER — SODIUM CHLORIDE 0.9 % WEIGHT BASED INFUSION: 1.0000 mL/kg/h | INTRAVENOUS | Status: DC

## 2020-02-26 MED ORDER — HEPARIN SODIUM (PORCINE) 1000 UNIT/ML IJ SOLN
INTRAMUSCULAR | Status: DC | PRN
  Administered 2020-02-26: 4000 [IU] via INTRAVENOUS
  Administered 2020-02-26: 2000 [IU] via INTRAVENOUS
  Administered 2020-02-26: 4000 [IU] via INTRAVENOUS

## 2020-02-26 MED ORDER — FENTANYL CITRATE (PF) 100 MCG/2ML IJ SOLN
INTRAMUSCULAR | Status: DC | PRN
  Administered 2020-02-26 (×2): 25 ug via INTRAVENOUS

## 2020-02-26 MED ORDER — CLOPIDOGREL BISULFATE 75 MG PO TABS: 75.0000 mg | ORAL_TABLET | Freq: Every day | ORAL | Status: DC

## 2020-02-26 MED ORDER — HEPARIN (PORCINE) IN NACL 1000-0.9 UT/500ML-% IV SOLN
INTRAVENOUS | Status: DC | PRN
  Administered 2020-02-26 (×2): 500 mL

## 2020-02-26 MED ORDER — MORPHINE SULFATE (PF) 2 MG/ML IV SOLN: 2.0000 mg | INTRAVENOUS | Status: DC | PRN

## 2020-02-26 MED ORDER — HYDRALAZINE HCL 20 MG/ML IJ SOLN: 10.0000 mg | INTRAMUSCULAR | Status: DC | PRN

## 2020-02-26 MED ORDER — ACETAMINOPHEN 325 MG PO TABS: 650.0000 mg | ORAL_TABLET | ORAL | Status: DC | PRN

## 2020-02-26 MED ORDER — METFORMIN HCL 500 MG PO TABS: 500.0000 mg | ORAL_TABLET | Freq: Two times a day (BID) | ORAL | 1 refills | Status: DC

## 2020-02-26 MED ORDER — CLOPIDOGREL BISULFATE 75 MG PO TABS: 75.0000 mg | ORAL_TABLET | Freq: Every day | ORAL | 3 refills | Status: DC

## 2020-02-26 MED ORDER — CLOPIDOGREL BISULFATE 75 MG PO TABS: 75.0000 mg | ORAL_TABLET | Freq: Every day | ORAL | 0 refills | Status: DC

## 2020-02-26 MED ORDER — LIDOCAINE HCL (PF) 1 % IJ SOLN
INTRAMUSCULAR | Status: DC | PRN
  Administered 2020-02-26: 3 mL

## 2020-02-26 MED FILL — CLOPIDOGREL 75 MG TABLET: 75 | 30 days supply | Qty: 30 | Fill #0

## 2020-02-26 MED FILL — metFORMIN HCL 500 MG TABS: 500 | 30 days supply | Qty: 60 | Fill #0

## 2020-02-26 MED FILL — ROSUVASTATIN CALCIUM 40 MG: 40 | 30 days supply | Qty: 30 | Fill #0

## 2020-02-26 SURGICAL SUPPLY — 20 items
BALLN SAPPHIRE 1.5X10 (BALLOONS) ×2
BALLN ~~LOC~~ EMERGE MR 2.25X6 (BALLOONS) ×2
BALLOON SAPPHIRE 1.5X10 (BALLOONS) ×1 IMPLANT
BALLOON ~~LOC~~ EMERGE MR 2.25X6 (BALLOONS) ×1 IMPLANT
CATH INFINITI JR4 5F (CATHETERS) ×2 IMPLANT
CATH OPTITORQUE TIG 4.0 5F (CATHETERS) ×2 IMPLANT
CATH VISTA GUIDE 6FR XBLAD3.0 (CATHETERS) ×2 IMPLANT
DEVICE RAD COMP TR BAND LRG (VASCULAR PRODUCTS) ×2 IMPLANT
GLIDESHEATH SLEND SS 6F .021 (SHEATH) ×2 IMPLANT
GUIDEWIRE INQWIRE 1.5J.035X260 (WIRE) ×1 IMPLANT
INQWIRE 1.5J .035X260CM (WIRE) ×2
KIT ENCORE 26 ADVANTAGE (KITS) ×2 IMPLANT
KIT HEART LEFT (KITS) ×2 IMPLANT
PACK CARDIAC CATHETERIZATION (CUSTOM PROCEDURE TRAY) ×2 IMPLANT
SHEATH PROBE COVER 6X72 (BAG) ×2 IMPLANT
STENT RESOLUTE ONYX 2.0X12 (Permanent Stent) ×2 IMPLANT
STENT RESOLUTE ONYX 2.0X8 (Permanent Stent) ×2 IMPLANT
TRANSDUCER W/STOPCOCK (MISCELLANEOUS) ×2 IMPLANT
TUBING CIL FLEX 10 FLL-RA (TUBING) ×2 IMPLANT
WIRE ASAHI PROWATER 180CM (WIRE) ×2 IMPLANT

## 2020-02-26 NOTE — Progress Notes (Signed)
CARDIAC REHAB PHASE I   Stent education completed with pt. Pt educated on importance of ASA, Plavix, and NTG. Pt given stent card, along with heart healthy and diabetic diets. Reviewed restrictions, site care, and exercise guidelines. Will refer to CRP II [place]. Pt is interested in participating in Virtual Cardiac and Pulmonary Rehab. Pt advised that Virtual Cardiac and Pulmonary Rehab is provided at no cost to the patient.  Checklist:  1. Pt has smart device  ie smartphone and/or ipad for downloading an app  Yes 2. Reliable internet/wifi service    Yes 3. Understands how to use their smartphone and navigate within an app.  Yes  Pt verbalized understanding and is in agreement.  [id number] Rufina Falco, RN BSN 02/26/2020 11:25 AM

## 2020-02-26 NOTE — Discharge Instructions (Signed)
Drink plenty of fluids next 48 hours and keep right wrist elevated at heart level for 24 hours No driving for 3 days Hold Metformin for 48 hours post cath No work for 1 week  Radial Site Care  This sheet gives you information about how to care for yourself after your procedure. Your health care provider may also give you more specific instructions. If you have problems or questions, contact your health care provider. What can I expect after the procedure? After the procedure, it is common to have:  Bruising and tenderness at the catheter insertion area. Follow these instructions at home: Medicines  Take over-the-counter and prescription medicines only as told by your health care provider. Insertion site care  Follow instructions from your health care provider about how to take care of your insertion site. Make sure you: ? Wash your hands with soap and water before you change your bandage (dressing). If soap and water are not available, use hand sanitizer. ? Remove your dressing as told by your health care provider. In 24 hours  Check your insertion site every day for signs of infection. Check for: ? Redness, swelling, or pain. ? Fluid or blood. ? Pus or a bad smell. ? Warmth.  Do not take baths, swim, or use a hot tub until your health care provider approves.  You may shower 24-48 hours after the procedure, or as directed by your health care provider. ? Remove the dressing and gently wash the site with plain soap and water. ? Pat the area dry with a clean towel. ? Do not rub the site. That could cause bleeding.  Do not apply powder or lotion to the site. Activity   For 24 hours after the procedure, or as directed by your health care provider: ? Do not flex or bend the affected arm. ? Do not push or pull heavy objects with the affected arm. ? Do not drive yourself home from the hospital or clinic. You may drive 24 hours after the procedure unless your health care provider  tells you not to. ? Do not operate machinery or power tools.  Do not lift anything that is heavier than 5 lb, or the limit that you are told, until your health care provider says that it is safe. For 1 week  Ask your health care provider when it is okay to: ? Return to work or school. ? Resume usual physical activities or sports. ? Resume sexual activity. General instructions  If the catheter site starts to bleed, raise your arm and put firm pressure on the site. If the bleeding does not stop, get help right away. This is a medical emergency.  If you went home on the same day as your procedure, a responsible adult should be with you for the first 24 hours after you arrive home.  Keep all follow-up visits as told by your health care provider. This is important. Contact a health care provider if:  You have a fever.  You have redness, swelling, or yellow drainage around your insertion site. Get help right away if:  You have unusual pain at the radial site.  The catheter insertion area swells very fast.  The insertion area is bleeding, and the bleeding does not stop when you hold steady pressure on the area.  Your arm or hand becomes pale, cool, tingly, or numb. These symptoms may represent a serious problem that is an emergency. Do not wait to see if the symptoms will go away. Get medical  help right away. Call your local emergency services (911 in the [place] Do not drive yourself to the hospital. Summary  After the procedure, it is common to have bruising and tenderness at the site.  Follow instructions from your health care provider about how to take care of your radial site wound. Check the wound every day for signs of infection.  Do not lift anything that is heavier than 10 lb (4.5 kg), or the limit that you are told, until your health care provider says that it is safe. This information is not intended to replace advice given to you by your health care provider. Make sure you  discuss any questions you have with your health care provider. Document Revised: 01/15/2018 Document Reviewed: 01/15/2018 Elsevier Patient Education  2020 [place] about your medication: Plavix (anti-platelet agent)  Generic Name (Brand): clopidogrel (Plavix), once daily medication  PURPOSE: You are taking this medication along with aspirin to lower your chance of having a heart attack, stroke, or blood clots in your heart stent. These can be fatal. Brilinta and aspirin help prevent platelets from sticking together and forming a clot that can block an artery or your stent.   Common SIDE EFFECTS you may experience include: bruising or bleeding more easily, shortness of breath  Do not stop taking PLAVIX without talking to the doctor who prescribes it for you. People who are treated with a stent and stop taking Plavix too soon, have a higher risk of getting a blood clot in the stent, having a heart attack, or dying. If you stop Plavix because of bleeding, or for other reasons, your risk of a heart attack or stroke may increase.   Tell all of your doctors and dentists that you are taking Plavix. They should talk to the doctor who prescribed plavix for you before you have any surgery or invasive procedure.   Contact your health care provider if you experience: severe or uncontrollable bleeding, pink/red/brown urine, vomiting blood or vomit that looks like "coffee grounds", red or black stools (looks like tar), coughing up blood or blood clots ----------------------------------------------------------------------------------------------------------------------

## 2020-02-26 NOTE — Discharge Summary (Signed)
Discharge Summary    Patient ID: Mary Holt MRN: [id number]; DOB: 04-03-1961  Admit date: 02/26/2020 Discharge date: 02/26/2020  Primary Care Provider: [name], FNP  Primary Cardiologist: Skeet Latch, MD  Primary Electrophysiologist:  None   Discharge Diagnoses    Principal Problem:   Abnormal cardiac CT angiography Active Problems:   Uncontrolled hypertension   BMI 32.0-32.9,adult   Prediabetes   Chest pain of uncertain etiology   Snoring   Coronary artery disease involving native coronary artery of native heart with angina pectoris [facility])    Diagnostic Studies/Procedures    Left heart cath 02/26/20:  LESION #1: 2nd Diag lesion is 95% stenosed.  A drug-eluting stent was successfully placed using a STENT RESOLUTE ONYX 2.0X8. Stable post dilation from 2.2 to 2.3 mm  Post intervention, there is a 0% residual stenosis.  --------------  LESION #2: Prox Cx to Mid Cx (OM 2) lesion is 95% stenosed. This lesion was not assessed by CT FFR, but is clearly significant.  A drug-eluting stent was successfully placed using a STENT RESOLUTE ONYX 2.0X12. Postdilated to 2.2 mm  Post intervention, there is a 0% residual stenosis.  --------------  Mid LAD lesion is 35% stenosed. Dist LAD lesion is 40% stenosed.  Prox RCA to Mid RCA lesion is 45% stenosed. Dist RCA lesion is 45% stenosed. -By CT FFR, summation of these 2 lesions was not visualized significant. (0.81)   SUMMARY  Severe two-vessel disease involving proximal 2nd Diag and LCx-OM 2 with moderate disease in the mid and distal RCA as well as distal LAD.  Successful two-vessel PCI: 2nd Diag 95%-0% w/ Resolute Onyx DES 2.0 mm x 8 mm (2.2-2.3 mm @ post-dilation); LCx-OM1 95%-0% w/ Resolute Onyx DES 2.0 mm x 12 mm (2.2 mm @ post-dilation).  Documented normal EF by Echo; normal LVEDP   RECOMMENDATIONS  Patient should be stable for same-day discharge given to uneventful PCI's.  She will follow-up with Dr.  Oval Linsey or APP  Per Dr. Doy Mince note, I have switched from simvastatin 10 mg to rosuvastatin 40 mg.  Do not restart Metformin for 48 hours post cath; restart ARB tomorrow AM  Diagnostic Dominance: Right  Intervention     _____________   History of Present Illness     Mary Holt is a 59 year old female with CAD, hypertension, diabetes,hyperlipidemia, and mitral valve prolapse here for follow up.  She was initially see for hypertension 12/2019.  [name] reported that when she was a young child she had episodes of chest pain.  She was evaluated and found to have mitral valve prolapse.  She previously took antibiotics prior to going to the dentist and developed a rash.  She was subsequently told that she no longer needed to take antibiotics.  At that appointment she reported episodes of chest pain that were occurring mostly when she lays down or with bending in certain positions.  However it also seems to be associated with stress.  Lately she has noticed that when working.  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  She does not get much formal exercise.  She was referred for coronary CT-a 01/2020 that revealed 25 to 49% ostial LAD disease followed by greater than 70% stenosis in ostial D1.  FFR was abnormal in this area.  She does have a history of GERD but feels that this has been well-controlled lately.  She continues to have chest pain. She notices it in the center of her  chest and it lasts for hours.  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 There is no associated shortness of breath but she does have diaphoresis but no nausea.  She was referred for a sleep study.   Hospital Course     Consultants: none  CAD Patient presented for scheduled heart catheterization today. LHC revealed 95% stenosis in the D2 treated with DES, and 95% stenosis in the Cx-OM2 with positive FFR and treated with DES. She tolerated the procedure well and recovered in short stay. She was  placed on ASA and plavix. Cath site C/D/I, minor oozing, no hematoma.    Hypertension - continue home edarbyclor   DM - resume metformin in 48 hr   Hyperlipidemia 12/07/2019: Cholesterol, Total 250; HDL 54; LDL Chol Calc (NIH) 176; Triglycerides 113 - changed to 40 mg crestor 02/22/20.    Did the patient have an acute coronary syndrome (MI, NSTEMI, STEMI, etc) this admission?:  No                               Did the patient have a percutaneous coronary intervention (stent / angioplasty)?:  Yes.     Cath/PCI Registry Performance & Quality Measures: 4. Aspirin prescribed? - Yes 5. ADP Receptor Inhibitor (Plavix/Clopidogrel, Brilinta/Ticagrelor or Effient/Prasugrel) prescribed (includes medically managed patients)? - Yes 6. High Intensity Statin (Lipitor 40-80mg  or Crestor 20-40mg ) prescribed? - Yes 7. For EF <40%, was ACEI/ARB prescribed? - No - Reason:  previously checked 8. For EF <40%, Aldosterone Antagonist (Spironolactone or Eplerenone) prescribed? - Not Applicable (EF >/= [id number]) 9. Cardiac Rehab Phase II ordered (Included Medically managed Patients)? - Yes   _____________  Discharge Vitals Blood pressure 115/67, pulse 77, temperature 97.9 F (36.6 C), temperature source Oral, resp. rate 12, height 5\' 3"  (1.6 m), weight 80.7 kg, last menstrual period 09/30/2014, SpO2 96 %.  Filed Weights   02/26/20 0539  Weight: 80.7 kg    Labs & Radiologic Studies    CBC No results for input(s): WBC, NEUTROABS, HGB, HCT, MCV, PLT in the last 72 hours. Basic Metabolic Panel No results for input(s): NA, K, CL, CO2, GLUCOSE, BUN, CREATININE, CALCIUM, MG, PHOS in the last 72 hours. Liver Function Tests No results for input(s): AST, ALT, ALKPHOS, BILITOT, PROT, ALBUMIN in the last 72 hours. No results for input(s): LIPASE, AMYLASE in the last 72 hours. High Sensitivity Troponin:   No results for input(s): TROPONINIHS in the last 720 hours.  BNP Invalid input(s): POCBNP D-Dimer No  results for input(s): DDIMER in the last 72 hours. Hemoglobin A1C No results for input(s): HGBA1C in the last 72 hours. Fasting Lipid Panel No results for input(s): CHOL, HDL, LDLCALC, TRIG, CHOLHDL, LDLDIRECT in the last 72 hours. Thyroid Function Tests No results for input(s): TSH, T4TOTAL, T3FREE, THYROIDAB in the last 72 hours.  Invalid input(s): FREET3 _____________  CARDIAC CATHETERIZATION  Result Date: 02/26/2020  LESION #1: 2nd Diag lesion is 95% stenosed.  A drug-eluting stent was successfully placed using a STENT RESOLUTE ONYX 2.0X8. Stable post dilation from 2.2 to 2.3 mm  Post intervention, there is a 0% residual stenosis.  --------------  LESION #2: Prox Cx to Mid Cx (OM 2) lesion is 95% stenosed. This lesion was not assessed by CT FFR, but is clearly significant.  A drug-eluting stent was successfully placed using a STENT RESOLUTE ONYX 2.0X12. Postdilated to 2.2 mm  Post intervention, there is a 0% residual stenosis.  --------------  Mid LAD lesion is 35% stenosed. Dist LAD lesion is 40% stenosed.  Prox RCA to Mid RCA lesion is 45% stenosed. Dist RCA lesion is 45% stenosed. -By CT FFR, summation of these 2 lesions was not visualized significant. (0.81)  SUMMARY  Severe two-vessel disease involving proximal 2nd Diag and LCx-OM 2 with moderate disease in the mid and distal RCA as well as distal LAD.  Successful two-vessel PCI: 2nd Diag 95%-0% w/ Resolute Onyx DES 2.0 mm x 8 mm (2.2-2.3 mm @ post-dilation); LCx-OM1 95%-0% w/ Resolute Onyx DES 2.0 mm x 12 mm (2.2 mm @ post-dilation).  Documented normal EF by Echo; normal LVEDP RECOMMENDATIONS  Patient should be stable for same-day discharge given to uneventful PCI's.  She will follow-up with Dr. Oval Linsey or APP  Per Dr. Doy Mince note, I have switched from simvastatin 10 mg to rosuvastatin 40 mg.  Do not restart Metformin for 48 hours post cath; restart ARB tomorrow AM Glenetta Hew, MD  CT CORONARY [facility] W/CTA COR W/SCORE Lewanda Rife  W/CM &/OR WO/CM  Addendum Date: 02/05/2020   ADDENDUM REPORT: 02/05/2020 12:24 HISTORY: 59 yo female with chest pain/anginal equiv, 32yr CHD risk 10-20%, not treadmill candidate EXAM: Cardiac/Coronary CTA TECHNIQUE: The patient was scanned on a [employer]. PROTOCOL: A 120 kV prospective scan was triggered in the descending thoracic aorta at 111 HU's. Axial non-contrast 3 mm slices were carried out through the heart. The data set was analyzed on a dedicated work station and scored using the [place]. Gantry rotation speed was 250 msecs and collimation was .6 mm. Beta blockade and 0.8 mg of sl NTG was given. The 3D data set was reconstructed in 5% intervals of the 67-82 % of the R-R cycle. Diastolic phases were analyzed on a dedicated work station using MPR, MIP and VRT modes. The patient received 136mL OMNIPAQUE IOHEXOL 350 MG/ML SOLN of contrast. FINDINGS: Quality: Good (HR 68) Coronary calcium score: The patient's coronary artery calcium score is 214, which places the patient in the 96th percentile. Coronary arteries: Normal coronary origins.  Right dominance. Right Coronary Artery: Dominant vessel. Moderate sized acute marginal branch. Spotty mixed mild proximal 25-49% stenosis (CADRADS2). Mild distal mixed 25-49% stenosis (CADRADS2). Left Main Coronary Artery: Normal vessel. Bifurcates into the LAD and LCx arteries. Left Anterior Descending Coronary Artery: Wraps around the apex. There is mild ostial mixed stenosis 25-49% (CADRADS2) at the LAD/D1 bifurcation, otherwise no significant distal disease. There is possibly severe, non-calcified 70-99% (CADRADS4) stenosis of the ostial D1 branch which is moderate-sized (3.0 mm) and gives off a sub-branch that tracks parallel to the LAD toward the apex. Left Circumflex Artery: AV groove vessel, appears to have minimal non-obstructive disease. There is a single OM1 branch, which also has minimal diffuse disease. Aorta: Normal size, 30 mm at the mid  ascending aorta (level of the PA bifurcation) measured double oblique. Aortic atherosclerosis. No dissection. Aortic Valve: Trileaflet.  Annular calcification. Other findings: Normal pulmonary vein drainage into the left atrium. Normal left atrial appendage without a thrombus. Normal size of the pulmonary artery. IMPRESSION: 1. Possibly severe ostial D1 stenosis of a medium-size branch, CADRADS = 4. CT FFR will be performed and reported separately. 2. Coronary calcium score of 214. This was 96th percentile for age and sex matched control. 3. Normal coronary origin with right dominance. 4. Aggressive risk factor modification is advised. Electronically Signed   By: Pixie Casino M.D.   On: 02/05/2020 12:24   Result Date: 02/05/2020 EXAM: OVER-READ INTERPRETATION  CT CHEST The following report is an over-read performed by radiologist Dr. Aletta Edouard of [facility], [place] on 02/05/2020. This over-read does not include interpretation of cardiac or coronary anatomy or pathology. The coronary CTA interpretation by the cardiologist is attached. COMPARISON:  None. FINDINGS: Vascular: No incidental vascular findings. Mediastinum/Nodes: Visualized mediastinum and hilar regions demonstrate no lymphadenopathy or masses. There is a small hiatal hernia. Lungs/Pleura: Mild bibasilar pulmonary scarring. Visualized lungs show no evidence of pulmonary edema, consolidation, pneumothorax, nodule or pleural fluid. Upper Abdomen: No acute abnormality. Musculoskeletal: No chest wall mass or suspicious bone lesions identified. IMPRESSION: No significant noncardiac findings in the visualized chest. Small hiatal hernia. Electronically Signed: By: Aletta Edouard M.D. On: 02/05/2020 11:07   CT CORONARY FRACTIONAL FLOW RESERVE FLUID ANALYSIS  Result Date: 02/10/2020 EXAM: CT FFR ANALYSIS HISTORY OF PRESENT ILLNESS: 59 yo female with chest pain/anginal equiv, 91yr CHD risk 10-20%, not treadmill candidate PROCEDURE: FFRct analysis  was performed on the original cardiac CT angiogram dataset. Diagrammatic representation of the FFRct analysis is provided in a separate PDF document in PACS. This dictation was created using the PDF document and an interactive 3D model of the results. 3D model is not available in the EMR/PACS. Normal FFR range is >0.80. FINDINGS: 1. Left Main:  No significant stenosis. FFR =0.97 2. LAD: No significant stenosis. Proximal FFR = 0.96, Mid FFR = 0.92, Distal FFR = 0.82 3. First Diagonal: Significant stenosis. Proximal FFR = 0.87, Mid FFR = 0.77, Distal FFR = 0.69 4. LCX: No significant stenosis. Proximal FFR = 0.97, Mid FFR = 0.94, Distal FFR = not calculated 5. RCA: No significant stenosis. Proximal FFR = 0.98, Mid FFR = 0.89, Distal FFR = 0.81 IMPRESSION: 1. CT FFR analysis demonstrated flow-limiting stenosis of the proximal to mid-LAD diagonal branch. 2. Clinical correlation with symptoms is recommended. Cardiac catheterization may be appropriate. Electronically Signed   By: Pixie Casino M.D.   On: 02/10/2020 17:08   Disposition   Pt is being discharged home today in good condition.  Follow-up Plans & Appointments    Follow-up Information    Deberah Pelton, NP Follow up on 03/07/2020.   Specialty: Cardiology Why: 3:15 pm for [facility] Contact information: [address] [place] [address] [phone]          Discharge Instructions    Amb Referral to Cardiac Rehabilitation   Complete by: As directed    Diagnosis: Coronary Stents   After initial evaluation and assessments completed: Virtual Based Care may be provided alone or in conjunction with Phase 2 Cardiac Rehab based on patient barriers.: Yes      Discharge Medications   Allergies as of 02/26/2020      Reactions   Nabumetone Rash   Penicillins Rash   Did it involve swelling of the face/tongue/throat, SOB, or low BP? Yes Did it involve sudden or severe rash/hives, skin peeling, or any reaction on the inside of your  mouth or nose? No Did you need to seek medical attention at a hospital or doctor's office? No When did it last happen? >10 years ago If all above answers are "NO", may proceed with cephalosporin use.      Medication List    STOP taking these medications   simvastatin 10 MG tablet Commonly known as: Zocor Replaced by: rosuvastatin 40 MG tablet     TAKE these medications   aspirin EC 81 MG tablet Take 1 tablet (81 mg total) by mouth daily.  clindamycin 300 MG capsule Commonly known as: CLEOCIN Take 2 tabs by mouth 30-60 minutes before dental procedure   clopidogrel 75 MG tablet Commonly known as: PLAVIX Take 1 tablet (75 mg total) by mouth daily with breakfast. Start taking on: February 27, 2020   Edarbyclor 40-12.5 MG Tabs Generic drug: Azilsartan-Chlorthalidone Take 1 tablet by mouth daily.   metFORMIN 500 MG tablet Commonly known as: Glucophage Take 1 tablet (500 mg total) by mouth 2 (two) times daily with a meal. Resume on 02/29/20. What changed: additional instructions   mometasone 0.1 % cream Commonly known as: Elocon Apply to affected area daily What changed:   how much to take  how to take this  when to take this  additional instructions   rosuvastatin 40 MG tablet Commonly known as: CRESTOR Take 1 tablet (40 mg total) by mouth daily at 6 PM. Replaces: simvastatin 10 MG tablet          Outstanding Labs/Studies   Sleep study  Duration of Discharge Encounter   Greater than 30 minutes including physician time.  Signed, Mary Lin Jarek Longton, PA 02/26/2020, 12:54 PM

## 2020-02-26 NOTE — Telephone Encounter (Signed)
-----   Message from Ledora Bottcher, [place] sent at 02/26/2020 12:43 PM EST ----- Needs TOC call.  Thanks Angie

## 2020-02-26 NOTE — Telephone Encounter (Signed)
Patient currently admitted

## 2020-02-26 NOTE — Interval H&P Note (Signed)
History and Physical Interval Note:  02/26/2020 7:31 AM  Mary Holt  has presented today for surgery, with the diagnosis of abnormal cardiac ct and angina.  The various methods of treatment have been discussed with the patient and family. After consideration of risks, benefits and other options for treatment, the patient has consented to  Procedure(s): LEFT HEART CATH AND CORONARY ANGIOGRAPHY (N/A)  PERCUTANEOUS CORONARY INTERVENTION  as a surgical intervention.  The patient's history has been reviewed, patient examined, no change in status, stable for surgery.  I have reviewed the patient's chart and labs.  Questions were answered to the patient's satisfaction.    Cath Lab Visit (complete for each Cath Lab visit)  Clinical Evaluation Leading to the Procedure:   ACS: No.  Non-ACS:    Anginal Classification: CCS II  Anti-ischemic medical therapy: Minimal Therapy (1 class of medications)  Non-Invasive Test Results: Intermediate-risk stress test findings: cardiac mortality 1-3%/year; Abnormal CT FFR Diag  Prior CABG: No previous CABG   Glenetta Hew

## 2020-03-01 ENCOUNTER — Telehealth (HOSPITAL_COMMUNITY): Payer: Self-pay

## 2020-03-01 NOTE — Telephone Encounter (Signed)
Pt insurance is active and benefits verified through [place], DED $1,400/$1,400 met, out of pocket $4,200/$4,074.68 met, co-insurance 20%. no pre-authorization required. Passport, 03/01/2020'@2' :38pm, REF# [id number]  Will contact patient to see if she is interested in the Cardiac Rehab Program. If interested, patient will need to complete follow up appt. Once completed, patient will be contacted for scheduling upon review by the RN Navigator.

## 2020-03-03 ENCOUNTER — Other Ambulatory Visit: Payer: Self-pay

## 2020-03-03 DIAGNOSIS — E119 Type 2 diabetes mellitus without complications: Secondary | ICD-10-CM

## 2020-03-03 MED ORDER — ROSUVASTATIN CALCIUM 40 MG PO TABS: 40.0000 mg | ORAL_TABLET | Freq: Every day | ORAL | 1 refills | Status: DC

## 2020-03-03 MED ORDER — METFORMIN HCL 500 MG PO TABS: 500.0000 mg | ORAL_TABLET | Freq: Two times a day (BID) | ORAL | 1 refills | Status: DC

## 2020-03-03 MED ORDER — CLOPIDOGREL BISULFATE 75 MG PO TABS: 75.0000 mg | ORAL_TABLET | Freq: Every day | ORAL | 1 refills | Status: DC

## 2020-03-06 NOTE — Progress Notes (Signed)
Cardiology Clinic Note   Patient Name: Mary Holt Date of Encounter: 03/06/2020  Primary Care Provider:  [name], [place] Primary Cardiologist:  Skeet Latch, MD  Patient Profile    Mary Holt. Mary Holt 59 year old female presents today for follow-up of her LHC 02/26/2020 with PCI and DES x2.  Past Medical History    Past Medical History:  Diagnosis Date  . Angina pectoris ([place]) 02/22/2020  . CAD in native artery 02/22/2020  . Chest pain of uncertain etiology [id number]  . Diabetes mellitus without complication ([place])   . Heart murmur    asymptomatic  . Hyperlipidemia   . Hypertension   . Kidney stones   . Kidney stones    several times, last  one 2014  . Mitral valve prolapse   . MVP (mitral valve prolapse)   . Snoring 01/06/2020   Past Surgical History:  Procedure Laterality Date  . BACK SURGERY     x2  . BREAST SURGERY     cyst right breast  . CARPAL TUNNEL RELEASE Bilateral   . CORONARY STENT INTERVENTION N/A 02/26/2020   Procedure: CORONARY STENT INTERVENTION;  Surgeon: Leonie Man, MD;  Location: [facility];  Service: Cardiovascular;  Laterality: N/A;  . DILATATION & CURRETTAGE/HYSTEROSCOPY WITH RESECTOCOPE N/A 08/11/2013   Procedure: DILATATION & CURETTAGE/HYSTEROSCOPY WITH RESECTOCOPE;  Surgeon: Marvene Staff, MD;  Location: [facility];  Service: Gynecology;  Laterality: N/A;  . LEFT HEART CATH AND CORONARY ANGIOGRAPHY N/A 02/26/2020   Procedure: LEFT HEART CATH AND CORONARY ANGIOGRAPHY;  Surgeon: Leonie Man, MD;  Location: [facility];  Service: Cardiovascular;  Laterality: N/A;  . TONSILLECTOMY    . TRIGGER FINGER RELEASE     left thumb  . TUBAL LIGATION      Allergies  Allergies  Allergen Reactions  . Nabumetone Rash  . Penicillins Rash    Did it involve swelling of the face/tongue/throat, SOB, or low BP? Yes Did it involve sudden or severe rash/hives, skin peeling, or any reaction on the inside of your mouth or nose?  No Did you need to seek medical attention at a hospital or doctor's office? No When did it last happen? >10 years ago If all above answers are "NO", may proceed with cephalosporin use.     History of Present Illness    Ms. Mary Holt has a PMH of coronary artery disease, HTN, diabetes, hyperlipidemia, and mitral valve prolapse.  She has a long history of chest pain dating back to childhood.  She also has a long history of mitral valve prolapse.  She has previously taken antibiotics prior to going to the dentist and developed a rash.  She was subsequently told that she no longer needs to take antibiotics.  She has had a history of chest discomfort when she lays down or with bending over.  This was believed to be related to stress.  She has also noticed chest discomfort with stressful work situations.  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  She does not do formal exercise outside of work.  She underwent a coronary CTA 01/2020 that showed 25-49% ostial LAD lesion, a greater than 70% lesion in the ostial D1.  Her FFR was also abnormal in this area.  She underwent cardiac catheterization on 02/26/2020 which showed a 95% second diagonal lesion, a 95% proximal circumflex to mid circumflex lesion, and 35% mid LAD stenosis.  She received PCI and DES x2.  Her EF at that time was normal with normal LVEDP.  Her simvastatin 10 mg was switched to rosuvastatin 40 mg post-cath.  She presents today for follow-up and states she feels well.  Has had no chest pain since her stents were placed.  She states she is eating a low-sodium heart healthy diet and has been increasing her physical activity slowly.  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  She states she has had one episode of dizziness that happened while she was seated.  She has been compliant with her medications, has not noticed any side effects and does not  have any questions about them at this time.  I will order a lipid panel and LFTs in 3 months, give her the salty 6 sheet, and have her return for follow-up in 3 months.  Today she denies chest pain, shortness of breath, lower extremity edema, fatigue, palpitations, melena, hematuria, hemoptysis, diaphoresis, weakness, presyncope, syncope, orthopnea, and PND.  Home Medications    Prior to Admission medications   Medication Sig Start Date End Date Taking? Authorizing Provider  aspirin EC 81 MG tablet Take 1 tablet (81 mg total) by mouth daily. 02/16/20   Skeet Latch, MD  Azilsartan-Chlorthalidone (EDARBYCLOR) 40-12.5 MG TABS Take 1 tablet by mouth daily. 02/04/20   [name], FNP  clindamycin (CLEOCIN) 300 MG capsule Take 2 tabs by mouth 30-60 minutes before dental procedure 02/04/20   [name], FNP  clopidogrel (PLAVIX) 75 MG tablet Take 1 tablet (75 mg total) by mouth daily with breakfast. 03/03/20   [name], FNP  metFORMIN (GLUCOPHAGE) 500 MG tablet Take 1 tablet (500 mg total) by mouth 2 (two) times daily with a meal. Resume on 02/29/20. 03/03/20 03/03/21  [name], FNP  mometasone (ELOCON) 0.1 % cream Apply to affected area daily Patient taking differently: Apply 1 application topically at bedtime.  02/15/20 02/14/21  [name], FNP  rosuvastatin (CRESTOR) 40 MG tablet Take 1 tablet (40 mg total) by mouth daily at 6 PM. 03/03/20   [name], FNP    Family History    Family History  Problem Relation Age of Onset  . Stroke Mother   . Heart attack Father   . Breast cancer Sister   . Heart attack Brother   . Ovarian cancer Sister   . Lymphoma Sister   . Heart attack Brother   . Colon cancer Neg Hx   . Colon polyps Neg Hx   . Esophageal cancer Neg Hx   . Rectal cancer Neg Hx   . Stomach cancer Neg Hx    She indicated that her mother is deceased. She indicated that her father is deceased. She indicated that four of her five sisters are alive. She indicated that  five of her six brothers are alive. She indicated that her maternal grandmother is deceased. She indicated that her maternal grandfather is deceased. She indicated that her paternal grandmother is deceased. She indicated that her paternal grandfather is deceased. She indicated that both of her sons are alive. She indicated that the status of her neg hx is unknown.  Social History    Social History   Socioeconomic History  . Marital status: Single    Spouse name: Not on file  . Number of children: Not on file  . Years of education: Not on file  . Highest education level: Not on file  Occupational History  . Not  on file  Tobacco Use  . Smoking status: Never Smoker  . Smokeless tobacco: Never Used  Substance and Sexual Activity  . Alcohol use: No  . Drug use: No  . Sexual activity: Not on file  Other Topics Concern  . Not on file  Social History Narrative  . Not on file   Social Determinants of Health   Financial Resource Strain:   . Difficulty of Paying Living Expenses:   Food Insecurity:   . Worried About Charity fundraiser in the Last Year:   . Arboriculturist in the Last Year:   Transportation Needs:   . Film/video editor (Medical):   Marland Kitchen Lack of Transportation (Non-Medical):   Physical Activity:   . Days of Exercise per Week:   . Minutes of Exercise per Session:   Stress:   . Feeling of Stress :   Social Connections:   . Frequency of Communication with Friends and Family:   . Frequency of Social Gatherings with Friends and Family:   . Attends Religious Services:   . Active Member of Clubs or Organizations:   . Attends Archivist Meetings:   Marland Kitchen Marital Status:   Intimate Partner Violence:   . Fear of Current or Ex-Partner:   . Emotionally Abused:   Marland Kitchen Physically Abused:   . Sexually Abused:      Review of Systems    General:  No chills, fever, night sweats or weight changes.  Cardiovascular:  No chest pain, dyspnea on exertion, edema, orthopnea,  palpitations, paroxysmal nocturnal dyspnea. Dermatological: No rash, lesions/masses Respiratory: No cough, dyspnea Urologic: No hematuria, dysuria Abdominal:   No nausea, vomiting, diarrhea, bright red blood per rectum, melena, or hematemesis Neurologic:  No visual changes, wkns, changes in mental status. All other systems reviewed and are otherwise negative except as noted above.  Physical Exam    VS:  LMP 09/30/2014  , BMI There is no height or weight on file to calculate BMI. GEN: Well nourished, well developed, in no acute distress. HEENT: normal. Neck: Supple, no JVD, carotid bruits, or masses. Cardiac: RRR, no murmurs, rubs, or gallops. No clubbing, cyanosis, edema.  Radials/DP/PT 2+ and equal bilaterally.  Respiratory:  Respirations regular and unlabored, clear to auscultation bilaterally. GI: Soft, nontender, nondistended, BS + x 4. MS: no deformity or atrophy. Skin: warm and dry, no rash.  Right radial cath site clean dry intact no drainage Neuro:  Strength and sensation are intact. Psych: Normal affect.  Accessory Clinical Findings    ECG personally reviewed by me today-sinus tachycardia 103 bpm no ST or T wave deviation.-  EKG 02/29/2020 Normal sinus rhythm no ST or T wave deviation 86 bpm  Echocardiogram 01/20/2020 IMPRESSIONS    1. Left ventricular ejection fraction, by visual estimation, is 60 to  65%. The left ventricle has normal function. There is no left ventricular  hypertrophy.  2. The left ventricle has no regional wall motion abnormalities.  3. Global right ventricle has normal systolic function.The right  ventricular size is normal. No increase in right ventricular wall  thickness.  4. Left atrial size was normal.  5. Right atrial size was normal.  6. The mitral valve is normal in structure. Trivial mitral valve  regurgitation. No evidence of mitral stenosis.  7. Prolapse not appreciated.  8. The tricuspid valve is normal in structure.  9.  The tricuspid valve is normal in structure. Tricuspid valve  regurgitation is trivial.  10. The aortic valve  is normal in structure. Aortic valve regurgitation is  not visualized. No evidence of aortic valve sclerosis or stenosis.  11. The pulmonic valve was normal in structure. Pulmonic valve  regurgitation is trivial.  12. Normal pulmonary artery systolic pressure.  13. The inferior vena cava is normal in size with greater than 50%  respiratory variability, suggesting right atrial pressure of 3 mmHg.  Cardiac catheterization 02/26/2020    LESION #1: 2nd Diag lesion is 95% stenosed.  A drug-eluting stent was successfully placed using a STENT RESOLUTE ONYX 2.0X8. Stable post dilation from 2.2 to 2.3 mm  Post intervention, there is a 0% residual stenosis.  --------------  LESION #2: Prox Cx to Mid Cx (OM 2) lesion is 95% stenosed. This lesion was not assessed by CT FFR, but is clearly significant.  A drug-eluting stent was successfully placed using a STENT RESOLUTE ONYX 2.0X12. Postdilated to 2.2 mm  Post intervention, there is a 0% residual stenosis.  --------------  Mid LAD lesion is 35% stenosed. Dist LAD lesion is 40% stenosed.  Prox RCA to Mid RCA lesion is 45% stenosed. Dist RCA lesion is 45% stenosed. -By CT FFR, summation of these 2 lesions was not visualized significant. (0.81)   SUMMARY  Severe two-vessel disease involving proximal 2nd Diag and LCx-OM 2 with moderate disease in the mid and distal RCA as well as distal LAD.  Successful two-vessel PCI: 2nd Diag 95%-0% w/ Resolute Onyx DES 2.0 mm x 8 mm (2.2-2.3 mm @ post-dilation); LCx-OM1 95%-0% w/ Resolute Onyx DES 2.0 mm x 12 mm (2.2 mm @ post-dilation).  Documented normal EF by Echo; normal LVEDP   RECOMMENDATIONS  Patient should be stable for same-day discharge given to uneventful PCI's.  She will follow-up with Dr. Oval Linsey or APP  Per Dr. Doy Mince note, I have switched from simvastatin 10 mg to rosuvastatin  40 mg. Do not restart Metformin for 48 hours post cath; restart ARB tomorrow AM  Diagnostic Dominance: Right  Intervention     Assessment & Plan   1.  Coronary artery disease-CTA 01/2020 that showed 25-49% ostial LAD lesion, a greater than 70% lesion in the ostial D1.  Her FFR was also abnormal in this area.  She underwent cardiac catheterization on 02/26/2020 which showed a 95% second diagonal lesion, a 95% proximal circumflex to mid circumflex lesion, and 35% mid LAD stenosis.  She received PCI and DES x2.  Her echocardiogram was normal at that time. Continue aspirin 81 mg daily Continue Plavix 75 mg daily Ordered nitroglycerin 0.4 mg sublingual as needed Continue rosuvastatin 40 mg tablet daily Heart healthy low-sodium diet-salty 6 given Increase physical activity as tolerated  [id number] Continue rosuvastatin 40 mg tablet daily Per healthy low-sodium high-fiber diet Increase physical activity as tolerated Repeat lipid panel and LFTs in 3 months  Essential hypertension-BP today 114/74. Well controlled at home. Continue azilsartan-chlorthalidone 40-12.5 mg daily Heart healthy low-sodium diet-salty 6 given Increase physical activity as tolerated  Diabetes mellitus-A1c 6.8 12/07/2019 Continue Metformin 500 mg twice daily Heart healthy low-sodium carb modified diet Increase physical activity as tolerated Monitored by PCP  Follow-up with Dr. Oval Linsey in 3 months.  Jossie Ng. [facility] [place] [address] Office [phone] Fax [phone]

## 2020-03-07 ENCOUNTER — Encounter: Payer: Self-pay | Admitting: General Practice

## 2020-03-07 ENCOUNTER — Ambulatory Visit (INDEPENDENT_AMBULATORY_CARE_PROVIDER_SITE_OTHER): Payer: 59 | Admitting: General Practice

## 2020-03-07 ENCOUNTER — Other Ambulatory Visit: Payer: Self-pay

## 2020-03-07 VITALS — BP 114/74 | HR 114 | Ht 63.0 in | Wt 180.2 lb

## 2020-03-07 DIAGNOSIS — E118 Type 2 diabetes mellitus with unspecified complications: Secondary | ICD-10-CM

## 2020-03-07 DIAGNOSIS — E7800 Pure hypercholesterolemia, unspecified: Secondary | ICD-10-CM

## 2020-03-07 DIAGNOSIS — I10 Essential (primary) hypertension: Secondary | ICD-10-CM

## 2020-03-07 DIAGNOSIS — Z955 Presence of coronary angioplasty implant and graft: Secondary | ICD-10-CM

## 2020-03-07 DIAGNOSIS — I25119 Atherosclerotic heart disease of native coronary artery with unspecified angina pectoris: Secondary | ICD-10-CM | POA: Diagnosis not present

## 2020-03-07 MED ORDER — NITROGLYCERIN 0.4 MG SL SUBL: 0.4000 mg | SUBLINGUAL_TABLET | SUBLINGUAL | 5 refills | Status: DC | PRN

## 2020-03-07 NOTE — Patient Instructions (Addendum)
Medication Instructions:  Your physician has recommended you make the following change in your medication:  1. START NITROGLYCERIN 0.4 MG SUBLINGUAL TABLET AS NEEDED FOR CHEST PAIN    *If you need a refill on your cardiac medications before your next appointment, please call your pharmacy*   Lab Work: TO BE DONE IN 3 MONTHS: LIPIDS, LFTS If you have labs (blood work) drawn today and your tests are completely normal, you will receive your results only by: Marland Kitchen MyChart Message (if you have MyChart) OR . A paper copy in the mail If you have any lab test that is abnormal or we need to change your treatment, we will call you to review the results.   Testing/Procedures: NONE   Follow-Up: At [facility], you and your health needs are our priority.  As part of our continuing mission to provide you with exceptional heart care, we have created designated Provider Care Teams.  These Care Teams include your primary Cardiologist (physician) and Advanced Practice Providers (APPs -  Physician Assistants and Nurse Practitioners) who all work together to provide you with the care you need, when you need it.  We recommend signing up for the patient portal called "MyChart".  Sign up information is provided on this After Visit Summary.  MyChart is used to connect with patients for Virtual Visits (Telemedicine).  Patients are able to view lab/test results, encounter notes, upcoming appointments, etc.  Non-urgent messages can be sent to your provider as well.   To learn more about what you can do with MyChart, go to [email or web address].    Your next appointment:   3 month(s)  The format for your next appointment:   In Person  Provider:   You may see Skeet Latch, MD or one of the following Advanced Practice Providers on your designated Care Team:    Kerin Ransom, PA-C  [place], [place]  [name], FNP  PLEASE KEEP A LOG OF [place]

## 2020-03-11 NOTE — Telephone Encounter (Signed)
Called patient to see if she was interested in participating in the Cardiac Rehab Program. Patient stated yes. Patient will come in for orientation on 03/24/2020@9 :30am and will attend the 9:00am exercise class.  Mailed homework package.

## 2020-03-14 ENCOUNTER — Encounter (HOSPITAL_BASED_OUTPATIENT_CLINIC_OR_DEPARTMENT_OTHER): Payer: 59 | Admitting: Cardiovascular Disease

## 2020-03-15 ENCOUNTER — Other Ambulatory Visit (INDEPENDENT_AMBULATORY_CARE_PROVIDER_SITE_OTHER): Payer: 59

## 2020-03-15 ENCOUNTER — Telehealth (HOSPITAL_COMMUNITY): Payer: Self-pay

## 2020-03-15 DIAGNOSIS — I10 Essential (primary) hypertension: Secondary | ICD-10-CM | POA: Diagnosis not present

## 2020-03-15 NOTE — Telephone Encounter (Signed)
Called pt to see if she wouldn't mind coming in for her first cardiac rehab session on 04/04/2020 instead of 04/01/2020. Advised pt, that particular spot wouldn't be open until 04/04/2020. Pt understood and will come in for her first session on 04/04/2020@9 :00am

## 2020-03-16 ENCOUNTER — Ambulatory Visit (INDEPENDENT_AMBULATORY_CARE_PROVIDER_SITE_OTHER): Payer: 59 | Admitting: Adult Health

## 2020-03-16 ENCOUNTER — Other Ambulatory Visit: Payer: Self-pay

## 2020-03-16 ENCOUNTER — Telehealth: Payer: Self-pay | Admitting: Cardiovascular Disease

## 2020-03-16 ENCOUNTER — Telehealth (HOSPITAL_COMMUNITY): Payer: Self-pay | Admitting: Pharmacist

## 2020-03-16 ENCOUNTER — Encounter: Payer: Self-pay | Admitting: Adult Health

## 2020-03-16 VITALS — BP 116/62 | HR 92 | Ht 63.0 in | Wt 179.2 lb

## 2020-03-16 DIAGNOSIS — R0789 Other chest pain: Secondary | ICD-10-CM

## 2020-03-16 MED ORDER — METOPROLOL TARTRATE 25 MG PO TABS: 12.5000 mg | ORAL_TABLET | Freq: Every day | ORAL | 2 refills | Status: DC

## 2020-03-16 MED ORDER — OMEPRAZOLE 40 MG PO CPDR: 40.0000 mg | DELAYED_RELEASE_CAPSULE | Freq: Every day | ORAL | 3 refills | Status: DC

## 2020-03-16 NOTE — Telephone Encounter (Signed)
New message   Patient states that she is having discomfort in her chest, not chest pains. Please call the patient to discuss. Patient states that the nitroglycerin are not working for her. Please call before 2 pm.

## 2020-03-16 NOTE — Telephone Encounter (Signed)
Spoke with patient and she has been having pressure in her chest like something sitting on it intermittently, no relief with NTG. This happens when she is up at work on feet working, relieved with rest at home. She is NOT currently having pressure. Scheduled appointment this am with Arnold Long DNP

## 2020-03-16 NOTE — Progress Notes (Signed)
Cardiology Office Note   Date:  03/16/2020   ID:  Mary Holt, DOB [birth year], MRN [id number]  PCP:  [name], FNP  Cardiologist:  Dr.Glyndon  No chief complaint on file.    History of Present Illness: Mary Holt is a 59 y.o. female who presents for complaints of recurrent chest pain/pressure.  She has a history of PCI with DES x2 per left heart catheterization on 02/26/2020.  Other history includes hypertension, hyperlipidemia, diabetes, mitral valve prolapse, and chronic chest pain dating back to childhood.  She had a CTA on 02/13/2020 which showed 25 to 49% ostial LAD lesion and a greater than 70% lesion in the ostial D1.  She underwent cardiac catheterization on 02/26/2020 with 95% second diagonal lesion and a 95% proximal circumflex to mid circumflex lesion and 35% mid LAD stenosis.  PCI and DES was placed to the second diagonal and proximal circumflex.  She was just seen in the office on 03/07/2020 and had no recurrence of chest pain.  She had not yet returned to work.  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  She called our office today with complaints of feelings of "something sitting on her chest" which occurred intermittently and not relieved with nitroglycerin.  It occurred while she was on her feet at work and relieved with rest at home.  As a result of this she was scheduled to be seen today.  This discomfort only occurs while she is at work.  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  When she gets off of work the pressure is released.  She states that she is constantly bending over as well as lifting heavy jug of water to fill the steamer.  She states that it lasts for 6 to 7 hours at a time.  Begins to diminish on her way home from work and by the time she returns home the feeling has left her.  Past Medical History:  Diagnosis Date  . Angina pectoris ([place]) 02/22/2020  . CAD in native artery 02/22/2020  . Chest pain of  uncertain etiology [id number]  . Diabetes mellitus without complication ([place])   . Heart murmur    asymptomatic  . Hyperlipidemia   . Hypertension   . Kidney stones   . Kidney stones    several times, last  one 2014  . Mitral valve prolapse   . MVP (mitral valve prolapse)   . Snoring 01/06/2020    Past Surgical History:  Procedure Laterality Date  . BACK SURGERY     x2  . BREAST SURGERY     cyst right breast  . CARPAL TUNNEL RELEASE Bilateral   . CORONARY STENT INTERVENTION N/A 02/26/2020   Procedure: CORONARY STENT INTERVENTION;  Surgeon: Leonie Man, MD;  Location: [facility];  Service: Cardiovascular;  Laterality: N/A;  . DILATATION & CURRETTAGE/HYSTEROSCOPY WITH RESECTOCOPE N/A 08/11/2013   Procedure: DILATATION & CURETTAGE/HYSTEROSCOPY WITH RESECTOCOPE;  Surgeon: Marvene Staff, MD;  Location: [facility];  Service: Gynecology;  Laterality: N/A;  . LEFT HEART CATH AND CORONARY ANGIOGRAPHY N/A 02/26/2020   Procedure: LEFT HEART CATH AND CORONARY ANGIOGRAPHY;  Surgeon: Leonie Man, MD;  Location: [facility];  Service: Cardiovascular;  Laterality: N/A;  . TONSILLECTOMY    . TRIGGER FINGER RELEASE     left thumb  . TUBAL LIGATION       Current Outpatient Medications  Medication Sig Dispense Refill  . aspirin  EC 81 MG tablet Take 1 tablet (81 mg total) by mouth daily. 90 tablet 3  . Azilsartan-Chlorthalidone (EDARBYCLOR) 40-12.5 MG TABS Take 1 tablet by mouth daily. 90 tablet 1  . clindamycin (CLEOCIN) 300 MG capsule Take 2 tabs by mouth 30-60 minutes before dental procedure 10 capsule 0  . clopidogrel (PLAVIX) 75 MG tablet Take 1 tablet (75 mg total) by mouth daily with breakfast. 90 tablet 1  . metFORMIN (GLUCOPHAGE) 500 MG tablet Take 1 tablet (500 mg total) by mouth 2 (two) times daily with a meal. Resume on 02/29/20. 180 tablet 1  . mometasone (ELOCON) 0.1 % cream Apply to affected area daily (Patient taking differently: Apply 1 application topically at  bedtime. ) 45 g 2  . nitroGLYCERIN (NITROSTAT) 0.4 MG SL tablet Place 1 tablet (0.4 mg total) under the tongue every 5 (five) minutes as needed for chest pain. 25 tablet 5  . rosuvastatin (CRESTOR) 40 MG tablet Take 1 tablet (40 mg total) by mouth daily at 6 PM. 90 tablet 1   No current facility-administered medications for this visit.    Allergies:   Nabumetone and Penicillins    Social History:  The patient  reports that she has never smoked. She has never used smokeless tobacco. She reports that she does not drink alcohol or use drugs.   Family History:  The patient's family history includes Breast cancer in her sister; Heart attack in her brother, brother, and father; Lymphoma in her sister; Ovarian cancer in her sister; Stroke in her mother.    ROS: All other systems are reviewed and negative. Unless otherwise mentioned in H&P    PHYSICAL EXAM: VS:  BP 116/62   Pulse 92   Ht 5\' 3"  (1.6 m)   Wt 179 lb 3.2 oz (81.3 kg)   LMP 09/30/2014   BMI 31.74 kg/m  , BMI Body mass index is 31.74 kg/m. GEN: Well nourished, well developed, in no acute distress HEENT: normal Neck: no JVD, carotid bruits, or masses Cardiac: RRR, tachycardic; no murmurs, rubs, or gallops,no edema  Respiratory:  Clear to auscultation bilaterally, normal work of breathing GI: soft, nontender, nondistended, + BS MS: no deformity or atrophy.  Pain is not reproducible with palpation. Skin: warm and dry, no rash Neuro:  Strength and sensation are intact Psych: euthymic mood, full affect   EKG: Normal sinus rhythm, sinus tachycardia heart rate of 92 bpm  Recent Labs: 12/07/2019: ALT 30 02/23/2020: BUN 21; Creatinine, Ser 0.83; Hemoglobin 12.8; Platelets 361; Potassium 4.2; Sodium 139    Lipid Panel    Component Value Date/Time   CHOL 250 (H) 12/07/2019 1412   TRIG 113 12/07/2019 1412   HDL 54 12/07/2019 1412   CHOLHDL 4.6 (H) 12/07/2019 1412   LDLCALC 176 (H) 12/07/2019 1412      Wt Readings from  Last 3 Encounters:  03/16/20 179 lb 3.2 oz (81.3 kg)  03/07/20 180 lb 3.2 oz (81.7 kg)  02/26/20 178 lb (80.7 kg)      Other studies Reviewed:  LHC 02/26/2020   LESION #1: 2nd Diag lesion is 95% stenosed.  A drug-eluting stent was successfully placed using a STENT RESOLUTE ONYX 2.0X8. Stable post dilation from 2.2 to 2.3 mm  Post intervention, there is a 0% residual stenosis.  --------------  LESION #2: Prox Cx to Mid Cx (OM 2) lesion is 95% stenosed. This lesion was not assessed by CT FFR, but is clearly significant.  A drug-eluting stent was successfully placed using a  STENT RESOLUTE ONYX 2.0X12. Postdilated to 2.2 mm  Post intervention, there is a 0% residual stenosis.  --------------  Mid LAD lesion is 35% stenosed. Dist LAD lesion is 40% stenosed.  Prox RCA to Mid RCA lesion is 45% stenosed. Dist RCA lesion is 45% stenosed. -By CT FFR, summation of these 2 lesions was not visualized significant. (0.81)   SUMMARY  Severe two-vessel disease involving proximal 2nd Diag and LCx-OM 2 with moderate disease in the mid and distal RCA as well as distal LAD.  Successful two-vessel PCI: 2nd Diag 95%-0% w/ Resolute Onyx DES 2.0 mm x 8 mm (2.2-2.3 mm @ post-dilation); LCx-OM1 95%-0% w/ Resolute Onyx DES 2.0 mm x 12 mm (2.2 mm @ post-dilation).  Documented normal EF by Echo; normal LVEDP   ASSESSMENT AND PLAN:  1.  Atypical chest pain: Described as pressure in the center of her chest substernal, "like a ball" in her chest.  No associated diaphoresis, dizziness, shortness of breath, or fatigue.  She bends over a lot picking up heavy jugs of water to feel a steamer as well as twisting and turning picking up close from a rack repetitively.  The pain lasts from 6 to 7 hours without stopping.  After having had her procedure she had no recurrent discomfort until she returned to work and began her regular activities.  Nitroglycerin does not help.  It is unclear if this is related to CAD as  it is only occurring when she is at work.  My plan will be to begin her on low-dose metoprolol tartrate 12.5 mg daily for antianginal purposes as well as omeprazole 40 mg daily and for possible GERD symptoms.  She will take her blood pressure every day.  We are going to provide a blood pressure machine for her.  I have allowed her to stay out of work this evening so that she can take her medicines and see what her response is prior to returning to work tomorrow.  2.  CAD: History of severe two-vessel CAD of the proximal second diagonal left circumflex and OM 2 with moderate disease in the mid and distal RCA as well as the distal LAD.  She has had PCI to the second diagonal with a drug-eluting stent and OM with a drug-eluting stent.  She continues on dual antiplatelet therapy.  If symptoms  symptoms worsen or intensify we may need to do a relook cath.  It is unusual that she only feels it at work, however.  3. Hypertension: Normal today She will check her BP daily and record this on the metoprolol.   4. Hyperlipidemia: Continue statin therapy.  She will have follow-up labs on next office visit if not already completed by PCP.  Goal of LDL less than 70.  Most recent level was 176 dated 12/07/2019.  Current medicines are reviewed at length with the patient today.  I have spent 25 dedicated to the care of this patient on the date of this encounter to include pre-visit review of records, assessment, management and diagnostic testing,with shared decision making.  Labs/ tests ordered today include: None  Phill Myron. West Pugh, ANP, AACC   03/16/2020 12:05 PM    [facility] [address] Office [phone] Fax [phone]  Notice: This dictation was prepared with Dragon dictation along with smaller phrase technology. Any transcriptional errors that result from this process are unintentional and may not be corrected upon review.

## 2020-03-16 NOTE — Patient Instructions (Signed)
Medication Instructions:  START- Metoprolol Tartrate 12.5 mg by mouth daily START- Omeprazole 40 mg by mouth daily  *If you need a refill on your cardiac medications before your next appointment, please call your pharmacy*   Lab Work: None Ordered  Testing/Procedures: None ordered   Follow-Up: At [facility], you and your health needs are our priority.  As part of our continuing mission to provide you with exceptional heart care, we have created designated Provider Care Teams.  These Care Teams include your primary Cardiologist (physician) and Advanced Practice Providers (APPs -  Physician Assistants and Nurse Practitioners) who all work together to provide you with the care you need, when you need it.  We recommend signing up for the patient portal called "MyChart".  Sign up information is provided on this After Visit Summary.  MyChart is used to connect with patients for Virtual Visits (Telemedicine).  Patients are able to view lab/test results, encounter notes, upcoming appointments, etc.  Non-urgent messages can be sent to your provider as well.   To learn more about what you can do with MyChart, go to [email or web address].    Your next appointment:   Keep appointment with Dr Oval Linsey on May 11th @ 8:40 am  The format for your next appointment:   In Person  Provider:   Skeet Latch, MD

## 2020-03-16 NOTE — Telephone Encounter (Signed)
Cardiac Rehab Medication Review by a Pharmacist  Does the patient  feel that his/her medications are working for him/her?  yes  Has the patient been experiencing any side effects to the medications prescribed?  no  Does the patient measure his/her own blood pressure or blood glucose at home?  no    Does the patient have any problems obtaining medications due to transportation or finances?   no  Understanding of regimen: good Understanding of indications: good Potential of compliance: good  Pharmacist comments: Today, patient visited cardiology office due to atypical chest pain and was prescribed metoprolol 12.5 mg daily. \ Lorel Monaco, PharmD PGY1 Ambulatory Care Resident [employer] # [phone]

## 2020-03-23 ENCOUNTER — Telehealth: Payer: Self-pay | Admitting: Adult Health

## 2020-03-23 ENCOUNTER — Telehealth (HOSPITAL_COMMUNITY): Payer: Self-pay | Admitting: *Deleted

## 2020-03-23 NOTE — Telephone Encounter (Signed)
Contacted patient- she states that she was seen last week, the metoprolol was helping with the feeling in her chest for a few days, but now that feeling (discomfort) has come back, and the medication does not seem to be helping.  Patient states that she is unsure of her blood pressure bc her machine looks weird and she is unsure of how it works.  I scheduled patient for 04/06 with Denyse Amass, NP and to bring her machine with her.  Patient verbalized understanding

## 2020-03-23 NOTE — Telephone Encounter (Signed)
New message:     Patient calling stating the doctor change some medication, patient states they are not working for her. Please call patient.

## 2020-03-24 ENCOUNTER — Encounter (HOSPITAL_COMMUNITY): Payer: Self-pay | Admitting: *Deleted

## 2020-03-24 ENCOUNTER — Ambulatory Visit (HOSPITAL_COMMUNITY): Payer: 59

## 2020-03-24 NOTE — Progress Notes (Signed)
Upon chart review preparing for cardiac rehab orientation, noted telephone call to cardiology office regarding chest discomfort despite medication changes.  F/u scheduled with [name] on 03/29/20 at 0915.  Will hold off on Cardiac rehab orientation until f/u completed.  Pt updated on this.  Plan to review f/u note and reach out to pt to be rescheduled.  Pt verbalized understanding.

## 2020-03-28 NOTE — Progress Notes (Signed)
Cardiology Clinic Note   Patient Name: Mary Holt Date of Encounter: 03/29/2020  Primary Care Provider:  [name], [place] Primary Cardiologist:  Skeet Latch, MD  Patient Profile    Mary Kluver. Holt 59 year old female presents today for follow-up of her LHC 02/26/2020 with PCI and DES x2, and atypical chest pain.  Past Medical History    Past Medical History:  Diagnosis Date  . Angina pectoris ([place]) 02/22/2020  . CAD in native artery 02/22/2020  . Chest pain of uncertain etiology [id number]  . Diabetes mellitus without complication ([place])   . Heart murmur    asymptomatic  . Hyperlipidemia   . Hypertension   . Kidney stones   . Kidney stones    several times, last  one 2014  . Mitral valve prolapse   . MVP (mitral valve prolapse)   . Snoring 01/06/2020   Past Surgical History:  Procedure Laterality Date  . BACK SURGERY     x2  . BREAST SURGERY     cyst right breast  . CARPAL TUNNEL RELEASE Bilateral   . CORONARY STENT INTERVENTION N/A 02/26/2020   Procedure: CORONARY STENT INTERVENTION;  Surgeon: Leonie Man, MD;  Location: [facility];  Service: Cardiovascular;  Laterality: N/A;  . DILATATION & CURRETTAGE/HYSTEROSCOPY WITH RESECTOCOPE N/A 08/11/2013   Procedure: DILATATION & CURETTAGE/HYSTEROSCOPY WITH RESECTOCOPE;  Surgeon: Marvene Staff, MD;  Location: [facility];  Service: Gynecology;  Laterality: N/A;  . LEFT HEART CATH AND CORONARY ANGIOGRAPHY N/A 02/26/2020   Procedure: LEFT HEART CATH AND CORONARY ANGIOGRAPHY;  Surgeon: Leonie Man, MD;  Location: [facility];  Service: Cardiovascular;  Laterality: N/A;  . TONSILLECTOMY    . TRIGGER FINGER RELEASE     left thumb  . TUBAL LIGATION      Allergies  Allergies  Allergen Reactions  . Nabumetone Rash  . Penicillins Rash    Did it involve swelling of the face/tongue/throat, SOB, or low BP? Yes Did it involve sudden or severe rash/hives, skin peeling, or any reaction on the inside of  your mouth or nose? No Did you need to seek medical attention at a hospital or doctor's office? No When did it last happen? >10 years ago If all above answers are "NO", may proceed with cephalosporin use.     History of Present Illness    Ms. [name] has a PMH of coronary artery disease, HTN, diabetes, hyperlipidemia, and mitral valve prolapse.  She has a long history of chest pain dating back to childhood.  She also has a long history of mitral valve prolapse.  She has previously taken antibiotics prior to going to the dentist and developed a rash.  She was subsequently told that she no longer needs to take antibiotics.  She has had a history of chest discomfort when she lays down or with bending over.  This was believed to be related to stress.  She has also noticed chest discomfort with stressful work situations.  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  She does not do formal exercise outside of work.  She underwent a coronary CTA 01/2020 that showed 25-49% ostial LAD lesion, a greater than 70% lesion in the ostial D1.  Her FFR was also abnormal in this area.  She underwent cardiac catheterization on 02/26/2020 which showed a 95% second diagonal lesion, a 95% proximal circumflex to mid circumflex lesion, and 35% mid LAD stenosis.  She received PCI  and DES x2.  Her EF at that time was normal with normal LVEDP.  Her simvastatin 10 mg was switched to rosuvastatin 40 mg post-cath.  She presented to the clinic 03/07/2020 for follow-up and stated she felt well.  Has had no chest pain since her stents were placed.  She stated she was eating a low-sodium heart healthy diet and had been increasing her physical activity slowly.  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  She stated she  had one episode of dizziness that happened while she was seated.  She had been compliant with her medications, had not noticed any  side effects and did not have any questions about them.  She contacted the cardiology clinic 03/16/2020 and stated that she was having symptoms of chest pressure while working.  She was prescribed metoprolol tartrate 12.5 mg daily and omeprazole 40 mg daily.  She was contacted 03/23/2020 and stated that she was still having pressure type sensations in her chest.  Her metoprolol had helped for a few days and then her discomfort returned.  She presents to the clinic today for follow-up evaluation and states she did not experience sustained relief from metoprolol or Prilosec and stopped taking the medications after a few days.  She stated that the metoprolol caused her to be fatigued and caused her arms to feel heavy with the lifting work that she does.  When asked about her chest pressure she stated that she notices the pressure when she is active at work lifting and steaming close.  She does experience some left lateral discomfort when she is not active at home cooking or watching television.  She was also unsure how to use her USB cable with her blood pressure machine we gave her.  All questions were answered and demonstration provided.  I will start amlodipine 5 mg, have her continue taking her blood pressure, and have her return in 2 weeks.  Today she denies chest pain, shortness of breath, lower extremity edema, fatigue, palpitations, melena, hematuria, hemoptysis, diaphoresis, weakness, presyncope, syncope, orthopnea, and PND.  Home Medications    Prior to Admission medications   Medication Sig Start Date End Date Taking? Authorizing Provider  aspirin EC 81 MG tablet Take 1 tablet (81 mg total) by mouth daily. 02/16/20   Skeet Latch, MD  Azilsartan-Chlorthalidone (EDARBYCLOR) 40-12.5 MG TABS Take 1 tablet by mouth daily. 02/04/20   [name], FNP  clindamycin (CLEOCIN) 300 MG capsule Take 2 tabs by mouth 30-60 minutes before dental procedure 02/04/20   [name], FNP  clopidogrel (PLAVIX)  75 MG tablet Take 1 tablet (75 mg total) by mouth daily with breakfast. 03/03/20   [name], FNP  metFORMIN (GLUCOPHAGE) 500 MG tablet Take 1 tablet (500 mg total) by mouth 2 (two) times daily with a meal. Resume on 02/29/20. 03/03/20 03/03/21  [name], FNP  metoprolol tartrate (LOPRESSOR) 25 MG tablet Take 0.5 tablets (12.5 mg total) by mouth daily. 03/16/20 06/14/20  Lendon Colonel, NP  mometasone (ELOCON) 0.1 % cream Apply to affected area daily Patient taking differently: Apply 1 application topically at bedtime.  02/15/20 02/14/21  [name], FNP  nitroGLYCERIN (NITROSTAT) 0.4 MG SL tablet Place 1 tablet (0.4 mg total) under the tongue every 5 (five) minutes as needed for chest pain. 03/07/20 06/05/20  Deberah Pelton, NP  omeprazole (PRILOSEC) 40 MG capsule Take 1 capsule (40 mg total) by mouth daily.  Patient not taking: Reported on 03/16/2020 03/16/20   Lendon Colonel, NP  rosuvastatin (CRESTOR) 40 MG tablet Take 1 tablet (40 mg total) by mouth daily at 6 PM. 03/03/20   [name], FNP    Family History    Family History  Problem Relation Age of Onset  . Stroke Mother   . Heart attack Father   . Breast cancer Sister   . Heart attack Brother   . Ovarian cancer Sister   . Lymphoma Sister   . Heart attack Brother   . Colon cancer Neg Hx   . Colon polyps Neg Hx   . Esophageal cancer Neg Hx   . Rectal cancer Neg Hx   . Stomach cancer Neg Hx    She indicated that her mother is deceased. She indicated that her father is deceased. She indicated that four of her five sisters are alive. She indicated that five of her six brothers are alive. She indicated that her maternal grandmother is deceased. She indicated that her maternal grandfather is deceased. She indicated that her paternal grandmother is deceased. She indicated that her paternal grandfather is deceased. She indicated that both of her sons are alive. She indicated that the status of her neg hx is unknown.  Social  History    Social History   Socioeconomic History  . Marital status: Single    Spouse name: Not on file  . Number of children: Not on file  . Years of education: Not on file  . Highest education level: Not on file  Occupational History  . Not on file  Tobacco Use  . Smoking status: Never Smoker  . Smokeless tobacco: Never Used  Substance and Sexual Activity  . Alcohol use: No  . Drug use: No  . Sexual activity: Not on file  Other Topics Concern  . Not on file  Social History Narrative  . Not on file   Social Determinants of Health   Financial Resource Strain:   . Difficulty of Paying Living Expenses:   Food Insecurity:   . Worried About Charity fundraiser in the Last Year:   . Arboriculturist in the Last Year:   Transportation Needs:   . Film/video editor (Medical):   Marland Kitchen Lack of Transportation (Non-Medical):   Physical Activity:   . Days of Exercise per Week:   . Minutes of Exercise per Session:   Stress:   . Feeling of Stress :   Social Connections:   . Frequency of Communication with Friends and Family:   . Frequency of Social Gatherings with Friends and Family:   . Attends Religious Services:   . Active Member of Clubs or Organizations:   . Attends Archivist Meetings:   Marland Kitchen Marital Status:   Intimate Partner Violence:   . Fear of Current or Ex-Partner:   . Emotionally Abused:   Marland Kitchen Physically Abused:   . Sexually Abused:      Review of Systems    General:  No chills, fever, night sweats or weight changes.  Cardiovascular:  No chest pain, dyspnea on exertion, edema, orthopnea, palpitations, paroxysmal nocturnal dyspnea. Dermatological: No rash, lesions/masses Respiratory: No cough, dyspnea Urologic: No hematuria, dysuria Abdominal:   No nausea, vomiting, diarrhea, bright red blood per rectum, melena, or hematemesis Neurologic:  No visual changes, wkns, changes in mental status. All other systems reviewed and are otherwise negative except as  noted above.  Physical Exam    VS:  BP  132/72   Pulse 91   Ht 5\' 3"  (1.6 m)   Wt 181 lb 3.2 oz (82.2 kg)   LMP 09/30/2014   SpO2 99%   BMI 32.10 kg/m  , BMI Body mass index is 32.1 kg/m. GEN: Well nourished, well developed, in no acute distress. HEENT: normal. Neck: Supple, no JVD, carotid bruits, or masses. Cardiac: RRR, no murmurs, rubs, or gallops. No clubbing, cyanosis, edema.  Radials/DP/PT 2+ and equal bilaterally.  Respiratory:  Respirations regular and unlabored, clear to auscultation bilaterally. GI: Soft, nontender, nondistended, BS + x 4. MS: no deformity or atrophy. Skin: warm and dry, no rash. Neuro:  Strength and sensation are intact. Psych: Normal affect.  Accessory Clinical Findings    ECG personally reviewed by me today-none today.  EKG 03/07/2020  sinus tachycardia 103 bpm no ST or T wave deviation.-  EKG 02/29/2020 Normal sinus rhythm no ST or T wave deviation 86 bpm  Echocardiogram 01/20/2020 IMPRESSIONS    1. Left ventricular ejection fraction, by visual estimation, is 60 to  65%. The left ventricle has normal function. There is no left ventricular  hypertrophy.  2. The left ventricle has no regional wall motion abnormalities.  3. Global right ventricle has normal systolic function.The right  ventricular size is normal. No increase in right ventricular wall  thickness.  4. Left atrial size was normal.  5. Right atrial size was normal.  6. The mitral valve is normal in structure. Trivial mitral valve  regurgitation. No evidence of mitral stenosis.  7. Prolapse not appreciated.  8. The tricuspid valve is normal in structure.  9. The tricuspid valve is normal in structure. Tricuspid valve  regurgitation is trivial.  10. The aortic valve is normal in structure. Aortic valve regurgitation is  not visualized. No evidence of aortic valve sclerosis or stenosis.  11. The pulmonic valve was normal in structure. Pulmonic valve  regurgitation is  trivial.  12. Normal pulmonary artery systolic pressure.  13. The inferior vena cava is normal in size with greater than 50%  respiratory variability, suggesting right atrial pressure of 3 mmHg.  Cardiac catheterization 02/26/2020    LESION #1: 2nd Diag lesion is 95% stenosed.  A drug-eluting stent was successfully placed using a STENT RESOLUTE ONYX 2.0X8. Stable post dilation from 2.2 to 2.3 mm  Post intervention, there is a 0% residual stenosis.  --------------  LESION #2: Prox Cx to Mid Cx (OM 2) lesion is 95% stenosed. This lesion was not assessed by CT FFR, but is clearly significant.  A drug-eluting stent was successfully placed using a STENT RESOLUTE ONYX 2.0X12. Postdilated to 2.2 mm  Post intervention, there is a 0% residual stenosis.  --------------  Mid LAD lesion is 35% stenosed. Dist LAD lesion is 40% stenosed.  Prox RCA to Mid RCA lesion is 45% stenosed. Dist RCA lesion is 45% stenosed. -By CT FFR, summation of these 2 lesions was not visualized significant. (0.81)  SUMMARY  Severe two-vessel disease involving proximal 2nd Diag and LCx-OM 2 with moderate disease in the mid and distal RCA as well as distal LAD.  Successful two-vessel PCI: 2nd Diag 95%-0% w/ Resolute Onyx DES 2.0 mm x 8 mm (2.2-2.3 mm @ post-dilation); LCx-OM1 95%-0% w/ Resolute Onyx DES 2.0 mm x 12 mm (2.2 mm @ post-dilation).  Documented normal EF by Echo; normal LVEDP   RECOMMENDATIONS  Patient should be stable for same-day discharge given to uneventful PCI's. She will follow-up with Dr. Oval Linsey or APP  Per Dr.  Reynolds note, I have switched from simvastatin 10 mg to rosuvastatin 40 mg. Do not restart Metformin for 48 hours post cath; restart ARB tomorrow AM  Diagnostic Dominance: Right  Intervention     Assessment & Plan   1.  Atypical chest pain-was prescribed metoprolol tartrate 12.5 on 03/16/2020 due to symptoms of chest discomfort with working.  She had not noted any  chest discomfort after her cardiac catheterization until she began her regular work activities.  Nitroglycerin did not provide relief. Stopped  metoprolol tartrate 12.5 daily Start amlodipine 5 mg daily Continue Prilosec 40 mg daily Continue heart healthy low-sodium diet Increase physical activity as tolerated  Coronary artery disease-CTA 01/2020 that showed 25-49% ostial LAD lesion, a greater than 70% lesion in the ostial D1.  Her FFR was also abnormal in this area.  She underwent cardiac catheterization on 02/26/2020 which showed a 95% second diagonal lesion, a 95% proximal circumflex to mid circumflex lesion, and 35% mid LAD stenosis.  She received PCI and DES x2.  Her echocardiogram was normal at that time. Continue aspirin 81 mg daily Continue Plavix 75 mg daily Ordered nitroglycerin 0.4 mg sublingual as needed Continue rosuvastatin 40 mg tablet daily Heart healthy low-sodium diet-salty 6 given Increase physical activity as tolerated  Essential hypertension-BP today 132/72. Continue azilsartan-chlorthalidone 40-12.5 mg daily Heart healthy low-sodium diet-salty 6 given Increase physical activity as tolerated  [id number] Continue rosuvastatin 40 mg tablet daily Per healthy low-sodium high-fiber diet Increase physical activity as tolerated Repeat lipid panel and LFTs in 2 months    Disposition: Follow-up with me in 2 weeks.  Jossie Ng. [facility] [address] Office [phone] Fax [phone]

## 2020-03-29 ENCOUNTER — Encounter: Payer: Self-pay | Admitting: General Practice

## 2020-03-29 ENCOUNTER — Other Ambulatory Visit: Payer: Self-pay

## 2020-03-29 ENCOUNTER — Ambulatory Visit (INDEPENDENT_AMBULATORY_CARE_PROVIDER_SITE_OTHER): Payer: 59 | Admitting: General Practice

## 2020-03-29 VITALS — BP 132/72 | HR 91 | Ht 63.0 in | Wt 181.2 lb

## 2020-03-29 DIAGNOSIS — Z955 Presence of coronary angioplasty implant and graft: Secondary | ICD-10-CM

## 2020-03-29 DIAGNOSIS — R0789 Other chest pain: Secondary | ICD-10-CM | POA: Diagnosis not present

## 2020-03-29 DIAGNOSIS — I10 Essential (primary) hypertension: Secondary | ICD-10-CM | POA: Diagnosis not present

## 2020-03-29 DIAGNOSIS — E7800 Pure hypercholesterolemia, unspecified: Secondary | ICD-10-CM

## 2020-03-29 DIAGNOSIS — I25119 Atherosclerotic heart disease of native coronary artery with unspecified angina pectoris: Secondary | ICD-10-CM

## 2020-03-29 MED ORDER — OMEPRAZOLE 40 MG PO CPDR: 40.0000 mg | DELAYED_RELEASE_CAPSULE | Freq: Every day | ORAL | 3 refills | Status: DC

## 2020-03-29 MED ORDER — AMLODIPINE BESYLATE 5 MG PO TABS: 5.0000 mg | ORAL_TABLET | Freq: Every day | ORAL | 3 refills | Status: DC

## 2020-03-29 NOTE — Patient Instructions (Signed)
Medication Instructions:  STOP METOPROLOL  START AMLODIPINE 5MG  DAILY  RESTART OMEPRAZOLE 40MG  DAILY *If you need a refill on your cardiac medications before your next appointment, please call your pharmacy*  Follow-Up: Your next appointment:  2 week(s) In Person with [name], FNP  At [facility], you and your health needs are our priority.  As part of our continuing mission to provide you with exceptional heart care, we have created designated Provider Care Teams.  These Care Teams include your primary Cardiologist (physician) and Advanced Practice Providers (APPs -  Physician Assistants and Nurse Practitioners) who all work together to provide you with the care you need, when you need it.  We recommend signing up for the patient portal called "MyChart".  Sign up information is provided on this After Visit Summary.  MyChart is used to connect with patients for Virtual Visits (Telemedicine).  Patients are able to view lab/test results, encounter notes, upcoming appointments, etc.  Non-urgent messages can be sent to your provider as well.   To learn more about what you can do with MyChart, go to [email or web address].

## 2020-03-30 ENCOUNTER — Telehealth (HOSPITAL_COMMUNITY): Payer: Self-pay | Admitting: *Deleted

## 2020-03-30 NOTE — Telephone Encounter (Signed)
Called and left message for pt advising her that we will hold on rescheduling cardiac rehab until her follow up appt on 4/26 has been complete satisfactorily. Pt seen in the office on yesterday and reported continual episodes of angina on exertion and at rest.  Medication - amlodipine added.  Pt will need to be on optimal medical management for her angina before moving forward with rescheduling.  Contact information provided for any follow up questions.  Will continue to follow pt progress. Cherre Huger, BSN Cardiac and Training and development officer

## 2020-04-01 ENCOUNTER — Ambulatory Visit (HOSPITAL_COMMUNITY): Payer: 59

## 2020-04-04 ENCOUNTER — Ambulatory Visit (HOSPITAL_COMMUNITY): Payer: 59

## 2020-04-06 ENCOUNTER — Ambulatory Visit (HOSPITAL_COMMUNITY): Payer: 59

## 2020-04-08 ENCOUNTER — Ambulatory Visit (HOSPITAL_COMMUNITY): Payer: 59

## 2020-04-08 ENCOUNTER — Other Ambulatory Visit: Payer: Self-pay | Admitting: Nurse Practitioner

## 2020-04-08 DIAGNOSIS — E119 Type 2 diabetes mellitus without complications: Secondary | ICD-10-CM

## 2020-04-11 ENCOUNTER — Ambulatory Visit (HOSPITAL_COMMUNITY): Payer: 59

## 2020-04-12 ENCOUNTER — Ambulatory Visit: Payer: 59 | Admitting: Physician Assistant

## 2020-04-13 ENCOUNTER — Ambulatory Visit (HOSPITAL_COMMUNITY): Payer: 59

## 2020-04-15 ENCOUNTER — Ambulatory Visit (HOSPITAL_COMMUNITY): Payer: 59

## 2020-04-17 NOTE — Progress Notes (Signed)
Cardiology Clinic Note   Patient Name: Mary Holt Date of Encounter: 04/18/2020  Primary Care Provider:  [name], [place] Primary Cardiologist:  Skeet Latch, MD  Patient Profile    Mary Holt 59 year old female presents today for follow-up of her LHC 02/26/2020 with PCI and DES x2, and atypical chest pain.   Past Medical History    Past Medical History:  Diagnosis Date  . Angina pectoris ([place]) 02/22/2020  . CAD in native artery 02/22/2020  . Chest pain of uncertain etiology [id number]  . Diabetes mellitus without complication ([place])   . Heart murmur    asymptomatic  . Hyperlipidemia   . Hypertension   . Kidney stones   . Kidney stones    several times, last  one 2014  . Mitral valve prolapse   . MVP (mitral valve prolapse)   . Snoring 01/06/2020   Past Surgical History:  Procedure Laterality Date  . BACK SURGERY     x2  . BREAST SURGERY     cyst right breast  . CARPAL TUNNEL RELEASE Bilateral   . CORONARY STENT INTERVENTION N/A 02/26/2020   Procedure: CORONARY STENT INTERVENTION;  Surgeon: Leonie Man, MD;  Location: [facility];  Service: Cardiovascular;  Laterality: N/A;  . DILATATION & CURRETTAGE/HYSTEROSCOPY WITH RESECTOCOPE N/A 08/11/2013   Procedure: DILATATION & CURETTAGE/HYSTEROSCOPY WITH RESECTOCOPE;  Surgeon: Marvene Staff, MD;  Location: [facility];  Service: Gynecology;  Laterality: N/A;  . LEFT HEART CATH AND CORONARY ANGIOGRAPHY N/A 02/26/2020   Procedure: LEFT HEART CATH AND CORONARY ANGIOGRAPHY;  Surgeon: Leonie Man, MD;  Location: [facility];  Service: Cardiovascular;  Laterality: N/A;  . TONSILLECTOMY    . TRIGGER FINGER RELEASE     left thumb  . TUBAL LIGATION      Allergies  Allergies  Allergen Reactions  . Nabumetone Rash  . Penicillins Rash    Did it involve swelling of the face/tongue/throat, SOB, or low BP? Yes Did it involve sudden or severe rash/hives, skin peeling, or any reaction on the inside of  your mouth or nose? No Did you need to seek medical attention at a hospital or doctor's office? No When did it last happen? >10 years ago If all above answers are "NO", may proceed with cephalosporin use.     History of Present Illness    Mary Holt has a PMH of coronary artery disease, HTN, diabetes, hyperlipidemia, and mitral valve prolapse. She has a long history of chest pain dating back to childhood. She also has a long history of mitral valve prolapse. She has previously taken antibiotics prior to going to the dentist and developed a rash. She was subsequently told that she no longer needs to take antibiotics.She has had a history of chest discomfort when she lays down or with bending over. This was believed to be related to stress. She has also noticed chest discomfort with stressful work situations. 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 She does not do formal exercise outside of work. She underwent a coronary CTA 01/2020 that showed 25-49% ostial LAD lesion, a greater than 70% lesion in the ostial D1. Her FFR was also abnormal in this area. She underwent cardiac catheterization on 02/26/2020 which showed a 95% second diagonal lesion, a 95% proximal circumflex to mid circumflex lesion, and 35% mid LAD stenosis. She received PCI and DES x2. Her EF at that time was normal with normal LVEDP.  Her simvastatin 10 mg was switched to rosuvastatin 40 mg post-cath.  She presented to the clinic 03/07/2020 for follow-up and statedshe felt well. Has had no chest pain since her stents were placed. She stated she was eating a low-sodium heart healthy diet and had been increasing her physical activity slowly. 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 She stated she  had one episode of dizziness that happened while she was seated. She had been compliant with her medications, had not noticed any  side effects and did not have any questions about them.  She contacted the cardiology clinic 03/16/2020 and stated that she was having symptoms of chest pressure while working.  She was prescribed metoprolol tartrate 12.5 mg daily and omeprazole 40 mg daily.  She was contacted 03/23/2020 and stated that she was still having pressure type sensations in her chest.  Her metoprolol had helped for a few days and then her discomfort returned.  She presented to the clinic 03/29/20 for follow-up evaluation and stated she did not experience sustained relief from metoprolol or Prilosec and stopped taking the medications after a few days.  She stated that the metoprolol caused her to be fatigued and caused her arms to feel heavy with the lifting work that she does.  When asked about her chest pressure she stated that she notices the pressure when she is active at work lifting and steaming close.  She did experience some left lateral discomfort when she was not active at home cooking or watching television.  She was also unsure how to use her USB cable with her blood pressure machine we gave her.  All questions were answered and demonstration provided.  I  started amlodipine 5 mg, had her continue taking her blood pressure, and had her return in 2 weeks.  She presents the clinic today and states she did not like the way that the amlodipine made her feel.  She felt the same way on the metoprolol.  She indicated that they both gave her a mild headache and caused some fatigue.  She stopped taking the amlodipine.  She continues to have chest discomfort with increased physical activity and notes some nonexertional left-sided flank discomfort at rest.  I will prescribe carvedilol 3.125 and have her follow-up with Dr. Oval Linsey as scheduled.  She has received her first COVID-19, [place] vaccination and is scheduled to have her second vaccination at the beginning of May.  I will have her continue her blood pressure log and bring it  to her next appointment.  Today shedenies chest pain, shortness of breath, lower extremity edema, fatigue, palpitations, melena, hematuria, hemoptysis, diaphoresis, weakness, presyncope, syncope, orthopnea, and PND.  Home Medications    Prior to Admission medications   Medication Sig Start Date End Date Taking? Authorizing Provider  amLODipine (NORVASC) 5 MG tablet Take 1 tablet (5 mg total) by mouth daily. 03/29/20 06/27/20  Deberah Pelton, NP  aspirin EC 81 MG tablet Take 1 tablet (81 mg total) by mouth daily. 02/16/20   Skeet Latch, MD  Azilsartan-Chlorthalidone (EDARBYCLOR) 40-12.5 MG TABS Take 1 tablet by mouth daily. 02/04/20   [name], FNP  clindamycin (CLEOCIN) 300 MG capsule Take 2 tabs by mouth 30-60 minutes before dental procedure 02/04/20   [name], FNP  clopidogrel (PLAVIX) 75 MG tablet Take 1 tablet (75 mg total) by mouth daily with breakfast. 03/03/20   [name], FNP  metFORMIN (GLUCOPHAGE)  500 MG tablet Take 1 tablet (500 mg total) by mouth 2 (two) times daily with a meal. Resume on 02/29/20. 03/03/20 03/03/21  [name], FNP  mometasone (ELOCON) 0.1 % cream Apply to affected area daily Patient taking differently: Apply 1 application topically at bedtime.  02/15/20 02/14/21  [name], FNP  omeprazole (PRILOSEC) 40 MG capsule Take 1 capsule (40 mg total) by mouth daily. 03/29/20   Deberah Pelton, NP  rosuvastatin (CRESTOR) 40 MG tablet Take 1 tablet (40 mg total) by mouth daily at 6 PM. 03/03/20   [name], FNP    Family History    Family History  Problem Relation Age of Onset  . Stroke Mother   . Heart attack Father   . Breast cancer Sister   . Heart attack Brother   . Ovarian cancer Sister   . Lymphoma Sister   . Heart attack Brother   . Colon cancer Neg Hx   . Colon polyps Neg Hx   . Esophageal cancer Neg Hx   . Rectal cancer Neg Hx   . Stomach cancer Neg Hx    She indicated that her mother is deceased. She indicated that her father is  deceased. She indicated that four of her five sisters are alive. She indicated that five of her six brothers are alive. She indicated that her maternal grandmother is deceased. She indicated that her maternal grandfather is deceased. She indicated that her paternal grandmother is deceased. She indicated that her paternal grandfather is deceased. She indicated that both of her sons are alive. She indicated that the status of her neg hx is unknown.  Social History    Social History   Socioeconomic History  . Marital status: Single    Spouse name: Not on file  . Number of children: Not on file  . Years of education: Not on file  . Highest education level: Not on file  Occupational History  . Not on file  Tobacco Use  . Smoking status: Never Smoker  . Smokeless tobacco: Never Used  Substance and Sexual Activity  . Alcohol use: No  . Drug use: No  . Sexual activity: Not on file  Other Topics Concern  . Not on file  Social History Narrative  . Not on file   Social Determinants of Health   Financial Resource Strain:   . Difficulty of Paying Living Expenses:   Food Insecurity:   . Worried About Charity fundraiser in the Last Year:   . Arboriculturist in the Last Year:   Transportation Needs:   . Film/video editor (Medical):   Marland Kitchen Lack of Transportation (Non-Medical):   Physical Activity:   . Days of Exercise per Week:   . Minutes of Exercise per Session:   Stress:   . Feeling of Stress :   Social Connections:   . Frequency of Communication with Friends and Family:   . Frequency of Social Gatherings with Friends and Family:   . Attends Religious Services:   . Active Member of Clubs or Organizations:   . Attends Archivist Meetings:   Marland Kitchen Marital Status:   Intimate Partner Violence:   . Fear of Current or Ex-Partner:   . Emotionally Abused:   Marland Kitchen Physically Abused:   . Sexually Abused:      Review of Systems    General:  No chills, fever, night sweats or  weight changes.  Cardiovascular:  No chest pain, dyspnea on exertion, edema,  orthopnea, palpitations, paroxysmal nocturnal dyspnea. Dermatological: No rash, lesions/masses Respiratory: No cough, dyspnea Urologic: No hematuria, dysuria Abdominal:   No nausea, vomiting, diarrhea, bright red blood per rectum, melena, or hematemesis Neurologic:  No visual changes, wkns, changes in mental status. All other systems reviewed and are otherwise negative except as noted above.  Physical Exam    VS:  BP 136/74   Pulse 85   Ht 5\' 3"  (1.6 m)   Wt 179 lb 9.6 oz (81.5 kg)   LMP 09/30/2014   SpO2 100%   BMI 31.81 kg/m  , BMI Body mass index is 31.81 kg/m. GEN: Well nourished, well developed, in no acute distress. HEENT: normal. Neck: Supple, no JVD, carotid bruits, or masses. Cardiac: RRR, no murmurs, rubs, or gallops. No clubbing, cyanosis, edema.  Radials/DP/PT 2+ and equal bilaterally.  Respiratory:  Respirations regular and unlabored, clear to auscultation bilaterally. GI: Soft, nontender, nondistended, BS + x 4. MS: no deformity or atrophy. Skin: warm and dry, no rash. Neuro:  Strength and sensation are intact. Psych: Normal affect.  Accessory Clinical Findings   ECG personally reviewed by me today-none today.  EKG 03/07/2020  sinus tachycardia 103 bpm no ST or T wave deviation.-  EKG 02/29/2020 Normal sinus rhythm no ST or T wave deviation 86 bpm  Echocardiogram 01/20/2020 IMPRESSIONS    1. Left ventricular ejection fraction, by visual estimation, is 60 to  65%. The left ventricle has normal function. There is no left ventricular  hypertrophy.  2. The left ventricle has no regional wall motion abnormalities.  3. Global right ventricle has normal systolic function.The right  ventricular size is normal. No increase in right ventricular wall  thickness.  4. Left atrial size was normal.  5. Right atrial size was normal.  6. The mitral valve is normal in structure. Trivial  mitral valve  regurgitation. No evidence of mitral stenosis.  7. Prolapse not appreciated.  8. The tricuspid valve is normal in structure.  9. The tricuspid valve is normal in structure. Tricuspid valve  regurgitation is trivial.  10. The aortic valve is normal in structure. Aortic valve regurgitation is  not visualized. No evidence of aortic valve sclerosis or stenosis.  11. The pulmonic valve was normal in structure. Pulmonic valve  regurgitation is trivial.  12. Normal pulmonary artery systolic pressure.  13. The inferior vena cava is normal in size with greater than 50%  respiratory variability, suggesting right atrial pressure of 3 mmHg.  Cardiac catheterization 02/26/2020   LESION #1: 2nd Diag lesion is 95% stenosed.  A drug-eluting stent was successfully placed using a STENT RESOLUTE ONYX 2.0X8. Stable post dilation from 2.2 to 2.3 mm  Post intervention, there is a 0% residual stenosis.  --------------  LESION #2: Prox Cx to Mid Cx (OM 2) lesion is 95% stenosed. This lesion was not assessed by CT FFR, but is clearly significant.  A drug-eluting stent was successfully placed using a STENT RESOLUTE ONYX 2.0X12. Postdilated to 2.2 mm  Post intervention, there is a 0% residual stenosis.  --------------  Mid LAD lesion is 35% stenosed. Dist LAD lesion is 40% stenosed.  Prox RCA to Mid RCA lesion is 45% stenosed. Dist RCA lesion is 45% stenosed. -By CT FFR, summation of these 2 lesions was not visualized significant. (0.81)  SUMMARY  Severe two-vessel disease involving proximal 2nd Diag and LCx-OM 2 with moderate disease in the mid and distal RCA as well as distal LAD.  Successful two-vessel PCI: 2nd Diag 95%-0% w/  Resolute Onyx DES 2.0 mm x 8 mm (2.2-2.3 mm @ post-dilation); LCx-OM1 95%-0% w/ Resolute Onyx DES 2.0 mm x 12 mm (2.2 mm @ post-dilation).  Documented normal EF by Echo; normal LVEDP   RECOMMENDATIONS  Patient should be stable for same-day discharge  given to uneventful PCI's. She will follow-up with Dr. Oval Linsey or APP  Per Dr. Doy Mince note, I have switched from simvastatin 10 mg to rosuvastatin 40 mg. Do not restart Metformin for 48 hours post cath; restart ARB tomorrow AM  Diagnostic Dominance: Right  Intervention     Assessment & Plan   1.  Atypical chest pain-patient stopped amlodipine that was prescribed at last visit.  She indicated that both the amlodipine and the metoprolol made her fatigued and gave her headache.  Was prescribed metoprolol tartrate 12.5 on 03/16/2020 due to symptoms of chest discomfort with working.  She had not noted any chest discomfort after her cardiac catheterization until she began her regular work activities.  She continues to have chest discomfort with work related activities.  Stopped  metoprolol tartrate 12.5 daily Stopped amlodipine 5 mg daily Start carvedilol 3.125 Continue Prilosec 40 mg daily Continue heart healthy low-sodium diet-salty 6 given Increase physical activity as tolerated  Coronary artery disease-no chest pain today.  CTA 01/2020 that showed 25-49% ostial LAD lesion, a greater than 70% lesion in the ostial D1. Her FFR was also abnormal in this area. She underwent cardiac catheterization on 02/26/2020 which showed a 95% second diagonal lesion, a 95% proximal circumflex to mid circumflex lesion, and 35% mid LAD stenosis. She received PCI and DES x2. Her echocardiogram was normal at that time. Continue aspirin 81 mg daily Continue Plavix 75 mg daily Orderednitroglycerin 0.4 mg sublingual as needed Continue rosuvastatin 40 mg tablet daily Heart healthy low-sodium diet-salty 6 given Increase physical activity as tolerated  Essential hypertension-BP today 136/74 Continue azilsartan-chlorthalidone 40-12.5 mg daily Start carvedilol 3.125 mg twice daily Heart healthy low-sodium diet-salty 6 given Increase physical activity as tolerated  Hyperlipidemia-LDL186 Continue  rosuvastatin 40 mg tablet daily Per healthy low-sodium high-fiber diet Increase physical activity as tolerated Repeat lipid panel and LFTs in 1.5 months   Disposition: Follow-up with Dr. Oval Linsey in 1 months.   Jossie Ng. Darryon Bastin NP-C    04/18/2020, 10:06 AM [place] [address] Office [phone] Fax [phone]

## 2020-04-18 ENCOUNTER — Encounter: Payer: Self-pay | Admitting: General Practice

## 2020-04-18 ENCOUNTER — Ambulatory Visit (INDEPENDENT_AMBULATORY_CARE_PROVIDER_SITE_OTHER): Payer: 59 | Admitting: General Practice

## 2020-04-18 ENCOUNTER — Other Ambulatory Visit: Payer: Self-pay

## 2020-04-18 ENCOUNTER — Ambulatory Visit (HOSPITAL_COMMUNITY): Payer: 59

## 2020-04-18 VITALS — BP 136/74 | HR 85 | Ht 63.0 in | Wt 179.6 lb

## 2020-04-18 DIAGNOSIS — I2510 Atherosclerotic heart disease of native coronary artery without angina pectoris: Secondary | ICD-10-CM | POA: Diagnosis not present

## 2020-04-18 DIAGNOSIS — I10 Essential (primary) hypertension: Secondary | ICD-10-CM | POA: Diagnosis not present

## 2020-04-18 DIAGNOSIS — R0789 Other chest pain: Secondary | ICD-10-CM | POA: Diagnosis not present

## 2020-04-18 DIAGNOSIS — E7800 Pure hypercholesterolemia, unspecified: Secondary | ICD-10-CM

## 2020-04-18 MED ORDER — CARVEDILOL 3.125 MG PO TABS: 3.1250 mg | ORAL_TABLET | Freq: Two times a day (BID) | ORAL | 3 refills | Status: DC

## 2020-04-18 NOTE — Patient Instructions (Signed)
Medication Instructions:  START CARVEDILOL 3.125MG  DAILY  *If you need a refill on your cardiac medications before your next appointment  Special Instructions OK TO START CARDIAC REHAB-THEY SHOULD BE CALLING YOU TO SCHEDULE.  PLEASE READ AND FOLLOW SALTY 6-ATTACHED  Follow-Up: Your next appointment:  KEEP SCHEDULED APPOINTMENTIn Person with Skeet Latch, MD  At [facility], you and your health needs are our priority.  As part of our continuing mission to provide you with exceptional heart care, we have created designated Provider Care Teams.  These Care Teams include your primary Cardiologist (physician) and Advanced Practice Providers (APPs -  Physician Assistants and Nurse Practitioners) who all work together to provide you with the care you need, when you need it.  We recommend signing up for the patient portal called "MyChart".  Sign up information is provided on this After Visit Summary.  MyChart is used to connect with patients for Virtual Visits (Telemedicine).  Patients are able to view lab/test results, encounter notes, upcoming appointments, etc.  Non-urgent messages can be sent to your provider as well.   To learn more about what you can do with MyChart, go to [email or web address].

## 2020-04-20 ENCOUNTER — Ambulatory Visit (HOSPITAL_COMMUNITY): Payer: 59

## 2020-04-22 ENCOUNTER — Ambulatory Visit (HOSPITAL_COMMUNITY): Payer: 59

## 2020-04-25 ENCOUNTER — Ambulatory Visit (HOSPITAL_COMMUNITY): Payer: 59

## 2020-04-27 ENCOUNTER — Ambulatory Visit (HOSPITAL_COMMUNITY): Payer: 59

## 2020-04-29 ENCOUNTER — Ambulatory Visit (HOSPITAL_COMMUNITY): Payer: 59

## 2020-05-01 NOTE — Progress Notes (Signed)
Cardiology Office Note   Date:  05/02/2020   ID:  Mary Holt, DOB 08/27/1961, MRN [id number]  PCP:  Mary Brine, FNP  Cardiologist:   Skeet Latch, MD   No chief complaint on file.    History of Present Illness: Mary Holt is a 59 y.o. female with CAD s/p PCI, hypertension, diabetes,hyperlipidemia, and mitral valve prolapse here for follow up.  She was initially seen for hypertension 12/2019.  Mary Holt reported that when she was a young child she had episodes of chest pain.  She was evaluated and found to have mitral valve prolapse.  She previously took antibiotics prior to going to the dentist and developed a rash.  She was subsequently told that she no longer needed to take antibiotics.  She saw Mary Holt.  At that time her blood pressure was poorly-controlled.  She was started on his losartan/chlorthalidone.  Olmesartan was discontinued.  She was also started on simvastatin. At that appointment she reported episodes of chest pain that were occurring mostly when she lays down or with bending in certain positions.  However it also seems to be associated with stress.  Lately she has noticed that when working.  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  She does not get much formal exercise.  She was referred for coronary CT-A 01/2020 that revealed 25 to 49% ostial LAD disease followed by greater than 70% stenosis in ostial D1.  FFR was abnormal in this area.  She underwent LHC 02/2020 and had PCI of a 95% D2 and 95% LCX lesions. Simvastatin was switched to rosuvastatin.  After her procedure she continued to have some chest discomfort.  She was started on omeprazole which helped some.  She saw Mary Holt 4/26 and still had some chest discomfort.  She was started on carvedilol and the discomfort seems to have improved significantly.  She struggles with getting in sleep.  She works on the second shift and sometimes has a hard time falling asleep.  She is  excited to start cardiac rehab and wants to try and lose some weight.  Overall her diet has been pretty good.  She has no lower extremity edema, orthopnea, or PND.  She has no chest pain and her breathing is stable.   Past Medical History:  Diagnosis Date  . Angina pectoris ([place]) 02/22/2020  . CAD in native artery 02/22/2020  . Chest pain of uncertain etiology 123XX123  . Diabetes mellitus without complication ([place])   . Heart murmur    asymptomatic  . Hyperlipidemia   . Hypertension   . Kidney stones   . Kidney stones    several times, last  one 2014  . Mitral valve prolapse   . MVP (mitral valve prolapse)   . Pure hypercholesterolemia 05/02/2020  . Snoring 01/06/2020    Past Surgical History:  Procedure Laterality Date  . BACK SURGERY     x2  . BREAST SURGERY     cyst right breast  . CARPAL TUNNEL RELEASE Bilateral   . CORONARY STENT INTERVENTION N/A 02/26/2020   Procedure: CORONARY STENT INTERVENTION;  Surgeon: Leonie Man, MD;  Location: [facility];  Service: Cardiovascular;  Laterality: N/A;  . DILATATION & CURRETTAGE/HYSTEROSCOPY WITH RESECTOCOPE N/A 08/11/2013   Procedure: DILATATION & CURETTAGE/HYSTEROSCOPY WITH RESECTOCOPE;  Surgeon: Marvene Staff, MD;  Location: [facility];  Service: Gynecology;  Laterality: N/A;  . LEFT HEART CATH AND CORONARY ANGIOGRAPHY N/A 02/26/2020  Procedure: LEFT HEART CATH AND CORONARY ANGIOGRAPHY;  Surgeon: Leonie Man, MD;  Location: [facility];  Service: Cardiovascular;  Laterality: N/A;  . TONSILLECTOMY    . TRIGGER FINGER RELEASE     left thumb  . TUBAL LIGATION       Current Outpatient Medications  Medication Sig Dispense Refill  . aspirin EC 81 MG tablet Take 1 tablet (81 mg total) by mouth daily. 90 tablet 3  . Azilsartan-Chlorthalidone (EDARBYCLOR) 40-12.5 MG TABS Take 1 tablet by mouth daily. 90 tablet 1  . carvedilol (COREG) 3.125 MG tablet Take 1 tablet (3.125 mg total) by mouth 2 (two) times daily. 60  tablet 3  . clindamycin (CLEOCIN) 300 MG capsule Take 2 tabs by mouth 30-60 minutes before dental procedure 10 capsule 0  . clopidogrel (PLAVIX) 75 MG tablet Take 1 tablet (75 mg total) by mouth daily with breakfast. 90 tablet 1  . metFORMIN (GLUCOPHAGE) 500 MG tablet Take 1 tablet (500 mg total) by mouth 2 (two) times daily with a meal. Resume on 02/29/20. 180 tablet 1  . mometasone (ELOCON) 0.1 % cream Apply to affected area daily (Patient taking differently: Apply 1 application topically at bedtime. ) 45 g 2  . omeprazole (PRILOSEC) 40 MG capsule Take 1 capsule (40 mg total) by mouth daily. 30 capsule 3  . rosuvastatin (CRESTOR) 40 MG tablet Take 1 tablet (40 mg total) by mouth daily at 6 PM. 90 tablet 1   No current facility-administered medications for this visit.    Allergies:   Nabumetone and Penicillins    Social History:  The patient  reports that she has never smoked. She has never used smokeless tobacco. She reports that she does not drink alcohol or use drugs.   Family History:  The patient's family history includes Breast cancer in her sister; Heart attack in her brother, brother, and father; Lymphoma in her sister; Ovarian cancer in her sister; Stroke in her mother.    ROS:  Please see the history of present illness.   Otherwise, review of systems are positive for none.   All other systems are reviewed and negative.    PHYSICAL EXAM: VS:  BP 126/74   Pulse 70   Ht 5\' 3"  (1.6 m)   Wt 180 lb (81.6 kg)   LMP 09/30/2014   SpO2 98%   BMI 31.89 kg/m  , BMI Body mass index is 31.89 kg/m. GENERAL:  Well appearing.  No acute distress HEENT: Pupils equal round and reactive, fundi not visualized, oral mucosa unremarkable NECK:  No jugular venous distention, waveform within normal limits, carotid upstroke brisk and symmetric, no bruits LUNGS:  Clear to auscultation bilaterally HEART:  RRR.  PMI not displaced or sustained,S1 and S2 within normal limits, no S3, no S4, no clicks, no  rubs, no murmurs ABD:  Flat, positive bowel sounds normal in frequency in pitch, no bruits, no rebound, no guarding, no midline pulsatile mass, no hepatomegaly, no splenomegaly EXT:  2 plus pulses throughout, no edema, no cyanosis no clubbing SKIN:  No rashes no nodules NEURO:  Cranial nerves II through XII grossly intact, motor grossly intact throughout PSYCH:  Cognitively intact, oriented to person place and time   EKG:  EKG is ordered today. The ekg ordered today demonstrates sinus rhythm 99 bpm.  Cannot rule out prior inferior infarct.  Echo 01/20/20:   1. Left ventricular ejection fraction, by visual estimation, is 60 to  65%. The left ventricle has normal function.  There is no left ventricular  hypertrophy.  2. The left ventricle has no regional wall motion abnormalities.  3. Global right ventricle has normal systolic function.The right  ventricular size is normal. No increase in right ventricular wall  thickness.  4. Left atrial size was normal.  5. Right atrial size was normal.  6. The mitral valve is normal in structure. Trivial mitral valve  regurgitation. No evidence of mitral stenosis.  7. Prolapse not appreciated.  8. The tricuspid valve is normal in structure.  9. The tricuspid valve is normal in structure. Tricuspid valve  regurgitation is trivial.  10. The aortic valve is normal in structure. Aortic valve regurgitation is  not visualized. No evidence of aortic valve sclerosis or stenosis.  11. The pulmonic valve was normal in structure. Pulmonic valve  regurgitation is trivial.   LHC 02/26/20:  LESION #1: 2nd Diag lesion is 95% stenosed.  A drug-eluting stent was successfully placed using a STENT RESOLUTE ONYX 2.0X8. Stable post dilation from 2.2 to 2.3 mm  Post intervention, there is a 0% residual stenosis.  --------------  LESION #2: Prox Cx to Mid Cx (OM 2) lesion is 95% stenosed. This lesion was not assessed by CT FFR, but is clearly  significant.  A drug-eluting stent was successfully placed using a STENT RESOLUTE ONYX 2.0X12. Postdilated to 2.2 mm  Post intervention, there is a 0% residual stenosis.  --------------  Mid LAD lesion is 35% stenosed. Dist LAD lesion is 40% stenosed.  Prox RCA to Mid RCA lesion is 45% stenosed. Dist RCA lesion is 45% stenosed. -By CT FFR, summation of these 2 lesions was not visualized significant. (0.81)     Recent Labs: 12/07/2019: ALT 30 02/23/2020: BUN 21; Creatinine, Ser 0.83; Hemoglobin 12.8; Platelets 361; Potassium 4.2; Sodium 139    Lipid Panel    Component Value Date/Time   CHOL 250 (H) 12/07/2019 1412   TRIG 113 12/07/2019 1412   HDL 54 12/07/2019 1412   CHOLHDL 4.6 (H) 12/07/2019 1412   LDLCALC 176 (H) 12/07/2019 1412      Wt Readings from Last 3 Encounters:  05/02/20 180 lb (81.6 kg)  04/18/20 179 lb 9.6 oz (81.5 kg)  03/29/20 181 lb 3.2 oz (82.2 kg)      ASSESSMENT AND PLAN:  # CAD:  # Hyperlipidemia:  Mary Holt had successful PCI of the OM2 and D2.  She is doing well and is ready for cardiac rehab.  Continue aspirin, carvedilol, clopidogrel, and rosuvastatin.She can stop Plavix 02/2021.   # Snoring: Sleep study pending.  # Mitral valve prolapse: Mild systolic murmur on exam.  No MVP on echo 12/2019.  # Hypertension: Blood pressure well-controlled.  Continue carvedilol and Edarbyclor.   Current medicines are reviewed at length with the patient today.  The patient does not have concerns regarding medicines.  The following changes have been made:  no change  Labs/ tests ordered today include:   Orders Placed This Encounter  Procedures  . Lipid panel  . Comprehensive metabolic panel     Disposition:   FU with Starlee Corralejo C. Oval Linsey, MD, [facility] in 6 months   Signed, Yuritzy Zehring C. Oval Linsey, MD, [facility]  05/02/2020 12:56 PM    [place]

## 2020-05-02 ENCOUNTER — Other Ambulatory Visit: Payer: Self-pay

## 2020-05-02 ENCOUNTER — Encounter: Payer: Self-pay | Admitting: Cardiovascular Disease

## 2020-05-02 ENCOUNTER — Ambulatory Visit (INDEPENDENT_AMBULATORY_CARE_PROVIDER_SITE_OTHER): Payer: 59 | Admitting: Cardiovascular Disease

## 2020-05-02 ENCOUNTER — Ambulatory Visit (HOSPITAL_COMMUNITY): Payer: 59

## 2020-05-02 VITALS — BP 126/74 | HR 70 | Ht 63.0 in | Wt 180.0 lb

## 2020-05-02 DIAGNOSIS — E7800 Pure hypercholesterolemia, unspecified: Secondary | ICD-10-CM | POA: Diagnosis not present

## 2020-05-02 DIAGNOSIS — Z5181 Encounter for therapeutic drug level monitoring: Secondary | ICD-10-CM

## 2020-05-02 DIAGNOSIS — I25119 Atherosclerotic heart disease of native coronary artery with unspecified angina pectoris: Secondary | ICD-10-CM

## 2020-05-02 DIAGNOSIS — I10 Essential (primary) hypertension: Secondary | ICD-10-CM

## 2020-05-02 HISTORY — DX: Pure hypercholesterolemia, unspecified: E78.00

## 2020-05-02 NOTE — Patient Instructions (Signed)
Medication Instructions:  Your physician recommends that you continue on your current medications as directed. Please refer to the Current Medication list given to you today.  *If you need a refill on your cardiac medications before your next appointment, please call your pharmacy*  Lab Work: LP/CMET TODAY   If you have labs (blood work) drawn today and your tests are completely normal, you will receive your results only by: Marland Kitchen MyChart Message (if you have MyChart) OR . A paper copy in the mail If you have any lab test that is abnormal or we need to change your treatment, we will call you to review the results.  Testing/Procedures: NONE   Follow-Up: At [facility], you and your health needs are our priority.  As part of our continuing mission to provide you with exceptional heart care, we have created designated Provider Care Teams.  These Care Teams include your primary Cardiologist (physician) and Advanced Practice Providers (APPs -  Physician Assistants and Nurse Practitioners) who all work together to provide you with the care you need, when you need it.  We recommend signing up for the patient portal called "MyChart".  Sign up information is provided on this After Visit Summary.  MyChart is used to connect with patients for Virtual Visits (Telemedicine).  Patients are able to view lab/test results, encounter notes, upcoming appointments, etc.  Non-urgent messages can be sent to your provider as well.   To learn more about what you can do with MyChart, go to [email or web address].    Your next appointment:   6 month(s) You will receive a reminder letter in the mail two months in advance. If you don't receive a letter, please call our office to schedule the follow-up appointment.   The format for your next appointment:   In Person  Provider:   You may see Skeet Latch, MD or one of the following Advanced Practice Providers on your designated Care Team:    Kerin Ransom,  PA-C  [place], [place]  [name], FNP  Other Instructions MESSAGE WITH BE SENT TO CARDIAC REHAB SO YOU CAN GET STARTED

## 2020-05-03 ENCOUNTER — Ambulatory Visit: Payer: 59 | Admitting: Cardiovascular Disease

## 2020-05-03 LAB — COMPREHENSIVE METABOLIC PANEL
ALT: 44 IU/L — ABNORMAL HIGH (ref 0–32)
AST: 32 IU/L (ref 0–40)
Albumin/Globulin Ratio: 1.4 (ref 1.2–2.2)
Albumin: 4.4 g/dL (ref 3.8–4.9)
Alkaline Phosphatase: 83 IU/L (ref 39–117)
BUN/Creatinine Ratio: 33 — ABNORMAL HIGH (ref 9–23)
BUN: 24 mg/dL (ref 6–24)
Bilirubin Total: <0.2 mg/dL (ref 0.0–1.2)
CO2: 23 mmol/L (ref 20–29)
Calcium: 9.4 mg/dL (ref 8.7–10.2)
Chloride: 104 mmol/L (ref 96–106)
Creatinine, Ser: 0.73 mg/dL (ref 0.57–1.00)
GFR calc Af Amer: 104 mL/min/{1.73_m2} (ref >59)
GFR calc non Af Amer: 90 mL/min/{1.73_m2} (ref >59)
Globulin, Total: 3.1 g/dL (ref 1.5–4.5)
Glucose: 130 mg/dL — ABNORMAL HIGH (ref 65–99)
Potassium: 4.6 mmol/L (ref 3.5–5.2)
Sodium: 143 mmol/L (ref 134–144)
Total Protein: 7.5 g/dL (ref 6.0–8.5)

## 2020-05-03 LAB — LIPID PANEL
Chol/HDL Ratio: 3.7 ratio (ref 0.0–4.4)
Cholesterol, Total: 154 mg/dL (ref 100–199)
HDL: 42 mg/dL (ref >39)
LDL Chol Calc (NIH): 93 mg/dL (ref 0–99)
Triglycerides: 102 mg/dL (ref 0–149)
VLDL Cholesterol Cal: 19 mg/dL (ref 5–40)

## 2020-05-04 ENCOUNTER — Ambulatory Visit (HOSPITAL_COMMUNITY): Payer: 59

## 2020-05-05 ENCOUNTER — Ambulatory Visit (INDEPENDENT_AMBULATORY_CARE_PROVIDER_SITE_OTHER): Payer: 59 | Admitting: Nurse Practitioner

## 2020-05-05 ENCOUNTER — Encounter: Payer: Self-pay | Admitting: Nurse Practitioner

## 2020-05-05 ENCOUNTER — Other Ambulatory Visit: Payer: Self-pay

## 2020-05-05 VITALS — BP 124/78 | HR 86 | Temp 98.5°F | Ht 62.2 in | Wt 178.4 lb

## 2020-05-05 DIAGNOSIS — I10 Essential (primary) hypertension: Secondary | ICD-10-CM

## 2020-05-05 DIAGNOSIS — E119 Type 2 diabetes mellitus without complications: Secondary | ICD-10-CM | POA: Diagnosis not present

## 2020-05-05 NOTE — Progress Notes (Signed)
This visit occurred during the SARS-CoV-2 public health emergency.  Safety protocols were in place, including screening questions prior to the visit, additional usage of staff PPE, and extensive cleaning of exam room while observing appropriate contact time as indicated for disinfecting solutions.  Subjective:     Patient ID: Mary Holt , female    DOB: Mar 08, 1961 , 59 y.o.   MRN: [id number]   Chief Complaint  Patient presents with  . Hypertension    HPI  She had her 2nd covid vaccine on Tuesday.    Wt Readings from Last 3 Encounters: 05/05/20 : 178 lb 6.4 oz (80.9 kg) 05/02/20 : 180 lb (81.6 kg) 04/18/20 : 179 lb 9.6 oz (81.5 kg)   Hypertension This is a chronic problem. The current episode started more than 1 year ago. The problem has been gradually improving since onset. The problem is controlled. Pertinent negatives include no anxiety, chest pain, headaches or palpitations. There are no associated agents to hypertension. Risk factors for coronary artery disease include dyslipidemia, sedentary lifestyle and obesity. Past treatments include angiotensin blockers. There are no compliance problems.  There is no history of angina. There is no history of chronic renal disease.     Past Medical History:  Diagnosis Date  . Angina pectoris ([place]) 02/22/2020  . CAD in native artery 02/22/2020  . Chest pain of uncertain etiology 123XX123  . Diabetes mellitus without complication ([place])   . Heart murmur    asymptomatic  . Hyperlipidemia   . Hypertension   . Kidney stones   . Kidney stones    several times, last  one 2014  . Mitral valve prolapse   . MVP (mitral valve prolapse)   . Pure hypercholesterolemia 05/02/2020  . Snoring 01/06/2020     Family History  Problem Relation Age of Onset  . Stroke Mother   . Heart attack Father   . Breast cancer Sister   . Heart attack Brother   . Ovarian cancer Sister   . Lymphoma Sister   . Heart attack Brother   . Colon cancer Neg Hx    . Colon polyps Neg Hx   . Esophageal cancer Neg Hx   . Rectal cancer Neg Hx   . Stomach cancer Neg Hx      Current Outpatient Medications:  .  aspirin EC 81 MG tablet, Take 1 tablet (81 mg total) by mouth daily., Disp: 90 tablet, Rfl: 3 .  Azilsartan-Chlorthalidone (EDARBYCLOR) 40-12.5 MG TABS, Take 1 tablet by mouth daily., Disp: 90 tablet, Rfl: 1 .  carvedilol (COREG) 3.125 MG tablet, Take 1 tablet (3.125 mg total) by mouth 2 (two) times daily., Disp: 60 tablet, Rfl: 3 .  clopidogrel (PLAVIX) 75 MG tablet, Take 1 tablet (75 mg total) by mouth daily with breakfast., Disp: 90 tablet, Rfl: 1 .  metFORMIN (GLUCOPHAGE) 500 MG tablet, Take 1 tablet (500 mg total) by mouth 2 (two) times daily with a meal. Resume on 02/29/20., Disp: 180 tablet, Rfl: 1 .  mometasone (ELOCON) 0.1 % cream, Apply to affected area daily, Disp: 45 g, Rfl: 2 .  rosuvastatin (CRESTOR) 40 MG tablet, Take 1 tablet (40 mg total) by mouth daily at 6 PM., Disp: 90 tablet, Rfl: 1 .  clindamycin (CLEOCIN) 300 MG capsule, Take 2 tabs by mouth 30-60 minutes before dental procedure (Patient not taking: Reported on 05/05/2020), Disp: 10 capsule, Rfl: 0 .  omeprazole (PRILOSEC) 40 MG capsule, Take 1 capsule (40 mg total) by mouth daily. (Patient not  taking: Reported on 05/05/2020), Disp: 30 capsule, Rfl: 3   Allergies  Allergen Reactions  . Nabumetone Rash  . Penicillins Rash    Did it involve swelling of the face/tongue/throat, SOB, or low BP? Yes Did it involve sudden or severe rash/hives, skin peeling, or any reaction on the inside of your mouth or nose? No Did you need to seek medical attention at a hospital or doctor's office? No When did it last happen? >10 years ago If all above answers are "NO", may proceed with cephalosporin use.      Review of Systems  Constitutional: Negative.  Negative for fatigue.  Respiratory: Negative.  Negative for cough.   Cardiovascular: Negative for chest pain, palpitations and leg swelling.   Neurological: Negative for dizziness and headaches.  Psychiatric/Behavioral: Negative.      Today's Vitals   05/05/20 0904  BP: 124/78  Pulse: 86  Temp: 98.5 F (36.9 C)  TempSrc: Oral  Weight: 178 lb 6.4 oz (80.9 kg)  Height: 5' 2.2" (1.58 m)  PainSc: 0-No pain   Body mass index is 32.42 kg/m.   Objective:  Physical Exam Vitals reviewed.  Constitutional:      General: She is not in acute distress.    Appearance: Normal appearance. She is well-developed.  HENT:     Right Ear: Hearing normal. There is impacted cerumen.     Left Ear: Hearing normal.  Eyes:     General: Lids are normal.     Funduscopic exam:    Right eye: No papilledema.        Left eye: No papilledema.  Neck:     Thyroid: No thyroid mass.     Vascular: No carotid bruit.  Cardiovascular:     Rate and Rhythm: Normal rate and regular rhythm.     Pulses: Normal pulses.     Heart sounds: Normal heart sounds. No murmur.  Pulmonary:     Effort: Pulmonary effort is normal.     Breath sounds: Normal breath sounds.  Chest:     Breasts:        Right: Normal. No mass or tenderness.        Left: Normal. No mass or tenderness.  Musculoskeletal:        General: No swelling. Normal range of motion.     Cervical back: Full passive range of motion without pain, normal range of motion and neck supple.     Right lower leg: No edema.     Left lower leg: No edema.  Lymphadenopathy:     Upper Body:     Right upper body: No supraclavicular adenopathy.     Left upper body: No supraclavicular adenopathy.  Skin:    General: Skin is warm and dry.     Capillary Refill: Capillary refill takes less than 2 seconds.  Neurological:     General: No focal deficit present.     Mental Status: She is alert and oriented to person, place, and time.     Cranial Nerves: No cranial nerve deficit.     Sensory: No sensory deficit.  Psychiatric:        Mood and Affect: Mood normal.        Behavior: Behavior normal.        Thought  Content: Thought content normal.        Judgment: Judgment normal.         Assessment And Plan:   1. Type 2 diabetes mellitus without complication, without long-term current use  of insulin (HCC)  Chronic, will check HgbA1c  Pending labs will consider increasing her metformin or adding another medication - Hemoglobin A1c  2. Essential hypertension  Chronic, she is much better controlled  She is being followed by Cardiology      [name], FNP    THE PATIENT IS ENCOURAGED TO PRACTICE SOCIAL DISTANCING DUE TO THE COVID-19 PANDEMIC.

## 2020-05-06 ENCOUNTER — Ambulatory Visit (HOSPITAL_COMMUNITY): Payer: 59

## 2020-05-06 LAB — HEMOGLOBIN A1C
Est. average glucose Bld gHb Est-mCnc: 160 mg/dL
Hgb A1c MFr Bld: 7.2 % — ABNORMAL HIGH (ref 4.8–5.6)

## 2020-05-09 ENCOUNTER — Ambulatory Visit (HOSPITAL_COMMUNITY): Payer: 59

## 2020-05-11 ENCOUNTER — Ambulatory Visit (HOSPITAL_COMMUNITY): Payer: 59

## 2020-05-13 ENCOUNTER — Ambulatory Visit (HOSPITAL_COMMUNITY): Payer: 59

## 2020-05-16 ENCOUNTER — Ambulatory Visit (HOSPITAL_COMMUNITY): Payer: 59

## 2020-05-17 ENCOUNTER — Other Ambulatory Visit: Payer: Self-pay

## 2020-05-17 ENCOUNTER — Telehealth: Payer: Self-pay

## 2020-05-17 DIAGNOSIS — E119 Type 2 diabetes mellitus without complications: Secondary | ICD-10-CM

## 2020-05-17 DIAGNOSIS — I10 Essential (primary) hypertension: Secondary | ICD-10-CM

## 2020-05-17 MED ORDER — METFORMIN HCL 500 MG PO TABS: 500.0000 mg | ORAL_TABLET | Freq: Two times a day (BID) | ORAL | 1 refills | Status: DC

## 2020-05-17 MED ORDER — CLOPIDOGREL BISULFATE 75 MG PO TABS: 75.0000 mg | ORAL_TABLET | Freq: Every day | ORAL | 1 refills | Status: DC

## 2020-05-17 MED ORDER — EDARBYCLOR 40-12.5 MG PO TABS: 1.0000 | ORAL_TABLET | Freq: Every day | ORAL | 1 refills | Status: DC

## 2020-05-17 MED ORDER — ROSUVASTATIN CALCIUM 40 MG PO TABS: 40.0000 mg | ORAL_TABLET | Freq: Every day | ORAL | 1 refills | Status: DC

## 2020-05-17 NOTE — Telephone Encounter (Signed)
Per provider ask if pt is currently taking Omeprazole on a daily? If so she needs to only take them as needed due to interaction w/plavix Per pt she is not taking the omeprazole she no longer needs it

## 2020-05-17 NOTE — Telephone Encounter (Signed)
Called pt LVM to call the office  

## 2020-05-18 ENCOUNTER — Ambulatory Visit (HOSPITAL_COMMUNITY): Payer: 59

## 2020-05-20 ENCOUNTER — Ambulatory Visit (HOSPITAL_COMMUNITY): Payer: 59

## 2020-05-25 ENCOUNTER — Telehealth (HOSPITAL_COMMUNITY): Payer: Self-pay

## 2020-05-25 ENCOUNTER — Ambulatory Visit (HOSPITAL_COMMUNITY): Payer: 59

## 2020-05-25 ENCOUNTER — Encounter (HOSPITAL_COMMUNITY): Payer: Self-pay

## 2020-05-25 NOTE — Telephone Encounter (Signed)
Attempted to call patient in regards to Cardiac Rehab - LM on VM Mailed letter 

## 2020-05-25 NOTE — Telephone Encounter (Signed)
Called patient to see if she was interested in participating in the Cardiac Rehab Program. Patient stated yes. Patient will come in for orientation on 06/23/2020@9 :00am and will attend the 7:15am exercise class.  Mailed homework package.

## 2020-06-07 ENCOUNTER — Other Ambulatory Visit: Payer: Self-pay

## 2020-06-07 DIAGNOSIS — Z5181 Encounter for therapeutic drug level monitoring: Secondary | ICD-10-CM

## 2020-06-09 ENCOUNTER — Other Ambulatory Visit: Payer: Self-pay

## 2020-06-09 ENCOUNTER — Ambulatory Visit (INDEPENDENT_AMBULATORY_CARE_PROVIDER_SITE_OTHER): Payer: 59 | Admitting: Pharmacist

## 2020-06-09 VITALS — BP 132/78 | HR 91 | Temp 98.0°F | Resp 14 | Ht 63.0 in | Wt 178.8 lb

## 2020-06-09 DIAGNOSIS — I10 Essential (primary) hypertension: Secondary | ICD-10-CM

## 2020-06-09 DIAGNOSIS — E7800 Pure hypercholesterolemia, unspecified: Secondary | ICD-10-CM

## 2020-06-09 MED ORDER — NEXLETOL 180 MG PO TABS: 180.0000 mg | ORAL_TABLET | Freq: Every day | ORAL | 0 refills | Status: DC

## 2020-06-09 NOTE — Progress Notes (Deleted)
Patient ID: Mary Holt                 DOB: [birth year]                      MRN: [id number]     HPI:  Mary Holt is a 59 y.o. female referred by Dr. Oval Linsey to HTN clinic.  Current HTN meds:  Carvedilol 3.125mg  BID Edarbyclor   Previously tried:   BP goal:   Family History: The patient's family history includes Breast cancer in her sister; Heart attack in her brother, brother, and father; Lymphoma in her sister; Ovarian cancer in her sister; Stroke in her mother.  Social History: The patient  reports that she has never smoked. She has never used smokeless tobacco. She reports that she does not drink alcohol or use drugs.   Diet:   Exercise:   Home BP readings:   Wt Readings from Last 3 Encounters:  05/05/20 178 lb 6.4 oz (80.9 kg)  05/02/20 180 lb (81.6 kg)  04/18/20 179 lb 9.6 oz (81.5 kg)   BP Readings from Last 3 Encounters:  05/05/20 124/78  05/02/20 126/74  04/18/20 136/74   Pulse Readings from Last 3 Encounters:  05/05/20 86  05/02/20 70  04/18/20 85    Renal function: CrCl cannot be calculated (Patient's most recent lab result is older than the maximum 21 days allowed.).  Past Medical History:  Diagnosis Date  . Angina pectoris ([place]) 02/22/2020  . CAD in native artery 02/22/2020  . Chest pain of uncertain etiology 8/36/6294  . Diabetes mellitus without complication ([place])   . Heart murmur    asymptomatic  . Hyperlipidemia   . Hypertension   . Kidney stones   . Kidney stones    several times, last  one 2014  . Mitral valve prolapse   . MVP (mitral valve prolapse)   . Pure hypercholesterolemia 05/02/2020  . Snoring 01/06/2020    Current Outpatient Medications on File Prior to Visit  Medication Sig Dispense Refill  . aspirin EC 81 MG tablet Take 1 tablet (81 mg total) by mouth daily. 90 tablet 3  . Azilsartan-Chlorthalidone (EDARBYCLOR) 40-12.5 MG TABS Take 1 tablet by mouth daily. 90 tablet 1  . carvedilol (COREG) 3.125 MG tablet Take 1  tablet (3.125 mg total) by mouth 2 (two) times daily. 60 tablet 3  . clindamycin (CLEOCIN) 300 MG capsule Take 2 tabs by mouth 30-60 minutes before dental procedure (Patient not taking: Reported on 05/05/2020) 10 capsule 0  . clopidogrel (PLAVIX) 75 MG tablet Take 1 tablet (75 mg total) by mouth daily with breakfast. 90 tablet 1  . metFORMIN (GLUCOPHAGE) 500 MG tablet Take 1 tablet (500 mg total) by mouth 2 (two) times daily with a meal. Resume on 02/29/20. 180 tablet 1  . mometasone (ELOCON) 0.1 % cream Apply to affected area daily 45 g 2  . rosuvastatin (CRESTOR) 40 MG tablet Take 1 tablet (40 mg total) by mouth daily at 6 PM. 90 tablet 1   No current facility-administered medications on file prior to visit.    Allergies  Allergen Reactions  . Nabumetone Rash  . Penicillins Rash    Did it involve swelling of the face/tongue/throat, SOB, or low BP? Yes Did it involve sudden or severe rash/hives, skin peeling, or any reaction on the inside of your mouth or nose? No Did you need to seek medical attention at a hospital or doctor's office? No When  did it last happen? >10 years ago If all above answers are "NO", may proceed with cephalosporin use.     Last menstrual period 09/30/2014.  No problem-specific Assessment & Plan notes found for this encounter.   Caylin Nass Rodriguez-Guzman PharmD, BCPS, [place] [id number] 06/09/2020 1:06 PM

## 2020-06-09 NOTE — Patient Instructions (Addendum)
Your Results:             Your most recent labs Goal  Total Cholesterol 154 < 200  Triglycerides 102 < 150  HDL (happy/good cholesterol) 42 > 40  LDL (bad cholesterol 93 < 70     Medication changes:  *START taking Nexletol 132mh daily*    Lab orders:   Repeat Lipid panel 3 months after initiating new medication

## 2020-06-11 LAB — HM DIABETES EYE EXAM

## 2020-06-15 ENCOUNTER — Encounter: Payer: Self-pay | Admitting: Pharmacist

## 2020-06-15 NOTE — Progress Notes (Signed)
Patient ID: Mary Holt                 DOB: 03/04/1961                    MRN: [id number]     HPI: Mary Holt is a 59 y.o. female patient referred to lipid clinic by Dr Oval Linsey. PMH is significant for CD s/p PCI, hypertension, diabetes, hyperlipidemia, and mitral valve prolapse.  Blood pressure remains well controlled with current therapy, but most recent LDL remains at goal for secondary prevention while patient on high intensity statin. Patient present to clinic for potential PCSK9i initiation and counseling. Denies problems with current therapy at this time.  Current Medications:  Rosuvastatin 40mg  daily  Intolerances: none  LDL goal: < 70mg /dL  Diet: balanced,  mainly home cooked or low fat take out  Exercise: activities of daily living. Very physical job.  Family History: The patient's family history includes Breast cancer in her sister; Heart attack in her brother, brother, and father; Lymphoma in her sister; Ovarian cancer in her sister; Stroke in her mother.  Social History: The patient  reports that she has never smoked. She has never used smokeless tobacco. She reports that she does not drink alcohol or use drugs.  Labs: 06/14/2020: CHO 154, TG 102, HDL 42, LDL-c 93 (on rosuvastatin 40mg  daily)  Past Medical History:  Diagnosis Date  . Angina pectoris ([place]) 02/22/2020  . CAD in native artery 02/22/2020  . Chest pain of uncertain etiology 2/63/3354  . Diabetes mellitus without complication ([place])   . Heart murmur    asymptomatic  . Hyperlipidemia   . Hypertension   . Kidney stones   . Kidney stones    several times, last  one 2014  . Mitral valve prolapse   . MVP (mitral valve prolapse)   . Pure hypercholesterolemia 05/02/2020  . Snoring 01/06/2020    Current Outpatient Medications on File Prior to Visit  Medication Sig Dispense Refill  . aspirin EC 81 MG tablet Take 1 tablet (81 mg total) by mouth daily. 90 tablet 3  . Azilsartan-Chlorthalidone (EDARBYCLOR)  40-12.5 MG TABS Take 1 tablet by mouth daily. 90 tablet 1  . carvedilol (COREG) 3.125 MG tablet Take 1 tablet (3.125 mg total) by mouth 2 (two) times daily. 60 tablet 3  . clopidogrel (PLAVIX) 75 MG tablet Take 1 tablet (75 mg total) by mouth daily with breakfast. 90 tablet 1  . metFORMIN (GLUCOPHAGE) 500 MG tablet Take 1 tablet (500 mg total) by mouth 2 (two) times daily with a meal. Resume on 02/29/20. 180 tablet 1  . mometasone (ELOCON) 0.1 % cream Apply to affected area daily 45 g 2  . rosuvastatin (CRESTOR) 40 MG tablet Take 1 tablet (40 mg total) by mouth daily at 6 PM. 90 tablet 1  . clindamycin (CLEOCIN) 300 MG capsule Take 2 tabs by mouth 30-60 minutes before dental procedure (Patient not taking: Reported on 05/05/2020) 10 capsule 0   No current facility-administered medications on file prior to visit.    Allergies  Allergen Reactions  . Nabumetone Rash  . Penicillins Rash    Did it involve swelling of the face/tongue/throat, SOB, or low BP? Yes Did it involve sudden or severe rash/hives, skin peeling, or any reaction on the inside of your mouth or nose? No Did you need to seek medical attention at a hospital or doctor's office? No When did it last happen? >10 years ago If all  above answers are "NO", may proceed with cephalosporin use.     Pure hypercholesterolemia LDL remains above goal while on high intensity statin. Patient reports compliance with therapy and diet. We discussed PCSK9i option including Repatha and Praluent , but patient will like to avoid injections for now.   Will add Nexletol 180mg  daily to her rosuvastatin therapy and repeat fasting blood work in 3 months. Co-pay card and 2 weeks Nexletol samples was provided. Plan to assess tolerability in 2 weeks and submit PA paperwork if needed.   Essential hypertension Blood pressure well controlled. No medication changes recommended at this time.   [name] PharmD, BCPS, [place] [address] 06/15/2020 8:56 AM

## 2020-06-15 NOTE — Assessment & Plan Note (Signed)
LDL remains above goal while on high intensity statin. Patient reports compliance with therapy and diet. We discussed PCSK9i option including Repatha and Praluent , but patient will like to avoid injections for now.   Will add Nexletol 180mg  daily to her rosuvastatin therapy and repeat fasting blood work in 3 months. Co-pay card and 2 weeks Nexletol samples was provided. Plan to assess tolerability in 2 weeks and submit PA paperwork if needed.

## 2020-06-15 NOTE — Assessment & Plan Note (Signed)
Blood pressure well controlled. No medication changes recommended at this time.

## 2020-06-15 NOTE — Assessment & Plan Note (Signed)
>>  ASSESSMENT AND PLAN FOR ESSENTIAL HYPERTENSION WRITTEN ON 06/15/2020  8:56 AM BY RODRIGUEZ-GUZMAN, RAQUEL, RPH-CPP  Blood pressure well controlled. No medication changes recommended at this time.

## 2020-06-16 ENCOUNTER — Telehealth: Payer: Self-pay

## 2020-06-16 DIAGNOSIS — E7800 Pure hypercholesterolemia, unspecified: Secondary | ICD-10-CM

## 2020-06-16 MED ORDER — BEMPEDOIC ACID 180 MG PO TABS: 1.0000 | ORAL_TABLET | Freq: Every day | ORAL | 1 refills | Status: DC

## 2020-06-16 NOTE — Telephone Encounter (Signed)
Called to let the pt know that they were denied the nexletol but that we are currently working on appealing it. I instructed the pt if they need samples to please call us and let us know

## 2020-06-16 NOTE — Addendum Note (Signed)
Addended by: Allean Found on: 06/16/2020 11:16 AM   Modules accepted: Orders

## 2020-06-16 NOTE — Telephone Encounter (Signed)
Called and spoke w/pt and asked how the nexletol was going. The pt stated that it seemd to be doing fine. I ordered uric acid, bmet, and lipid panel per raquel's request. Instructed the pt to come anytime because there is no appointment needed and the pt voiced understanding

## 2020-06-16 NOTE — Telephone Encounter (Signed)
-----   Message from Harrington Challenger, [place] sent at 06/15/2020  8:24 AM EDT ----- Send Nexletol Rx to prefer pharmacy

## 2020-06-21 ENCOUNTER — Telehealth (HOSPITAL_COMMUNITY): Payer: Self-pay | Admitting: Student-PharmD

## 2020-06-22 ENCOUNTER — Telehealth (HOSPITAL_COMMUNITY): Payer: Self-pay | Admitting: *Deleted

## 2020-06-23 ENCOUNTER — Encounter (HOSPITAL_COMMUNITY): Payer: Self-pay

## 2020-06-23 ENCOUNTER — Encounter (HOSPITAL_COMMUNITY)
Admission: RE | Admit: 2020-06-23 | Discharge: 2020-06-23 | Disposition: A | Discharge status: Home / Self Care | Payer: 59 | Source: Ambulatory Visit | Attending: Cardiovascular Disease | Admitting: Cardiovascular Disease

## 2020-06-23 ENCOUNTER — Other Ambulatory Visit: Payer: Self-pay

## 2020-06-23 VITALS — BP 120/62 | HR 72 | Ht 63.25 in | Wt 176.6 lb

## 2020-06-23 DIAGNOSIS — Z955 Presence of coronary angioplasty implant and graft: Secondary | ICD-10-CM

## 2020-06-23 LAB — GLUCOSE, CAPILLARY: Glucose-Capillary: 123 mg/dL — ABNORMAL HIGH (ref 70–99)

## 2020-06-23 NOTE — Telephone Encounter (Signed)
Pt insurance is active and benefits verified through [place] [employer], DED $1,400/$1,400 met, out of pocket $4,200/$4,200 met, co-insurance 20%. no pre-authorization required. Passport, 06/23/2020'@9' :08am, REF# [id number]

## 2020-06-23 NOTE — Progress Notes (Signed)
Cardiac Rehab Medication Review by a Nurse  Does the patient  feel that his/her medications are working for him/her?  yes  Has the patient been experiencing any side effects to the medications prescribed?  no  Does the patient measure his/her own blood pressure or blood glucose at home?  no   Does the patient have any problems obtaining medications due to transportation or finances?   no  Understanding of regimen: good Understanding of indications: good Potential of compliance: good    Nurse comments: Angeliz is taking her medications as prescribed. Garima does not have a CBG meter and does not check her CBG's at home.    Harrell Gave RN BSN 06/23/2020 1:47 PM

## 2020-06-23 NOTE — Progress Notes (Signed)
Cardiac Individual Treatment Plan  Patient Details  Name: Mary Holt MRN: [id number] Date of Birth: Jul 23, 1961 Referring Provider:     CARDIAC REHAB PHASE II ORIENTATION from 06/23/2020 in [place]  Referring Provider Skeet Latch, MD      Initial Encounter Date:    CARDIAC REHAB PHASE II ORIENTATION from 06/23/2020 in [place]  Date 06/23/20      Visit Diagnosis: S/P DES OM2, 2nd Diagonal 03/07/20  Patient's Home Medications on Admission:  Current Outpatient Medications:  .  aspirin EC 81 MG tablet, Take 1 tablet (81 mg total) by mouth daily., Disp: 90 tablet, Rfl: 3 .  Azilsartan-Chlorthalidone (EDARBYCLOR) 40-12.5 MG TABS, Take 1 tablet by mouth daily., Disp: 90 tablet, Rfl: 1 .  Bempedoic Acid 180 MG TABS, Take 1 tablet by mouth daily., Disp: 90 tablet, Rfl: 1 .  carvedilol (COREG) 3.125 MG tablet, Take 1 tablet (3.125 mg total) by mouth 2 (two) times daily., Disp: 60 tablet, Rfl: 3 .  clindamycin (CLEOCIN) 300 MG capsule, Take 2 tabs by mouth 30-60 minutes before dental procedure (Patient not taking: Reported on 05/05/2020), Disp: 10 capsule, Rfl: 0 .  clopidogrel (PLAVIX) 75 MG tablet, Take 1 tablet (75 mg total) by mouth daily with breakfast., Disp: 90 tablet, Rfl: 1 .  metFORMIN (GLUCOPHAGE) 500 MG tablet, Take 1 tablet (500 mg total) by mouth 2 (two) times daily with a meal. Resume on 02/29/20., Disp: 180 tablet, Rfl: 1 .  mometasone (ELOCON) 0.1 % cream, Apply to affected area daily, Disp: 45 g, Rfl: 2 .  rosuvastatin (CRESTOR) 40 MG tablet, Take 1 tablet (40 mg total) by mouth daily at 6 PM., Disp: 90 tablet, Rfl: 1  Past Medical History: Past Medical History:  Diagnosis Date  . Angina pectoris ([place]) 02/22/2020  . CAD in native artery 02/22/2020  . Chest pain of uncertain etiology 5/70/1779  . Diabetes mellitus without complication ([place])   . Heart murmur    asymptomatic  . Hyperlipidemia   .  Hypertension   . Kidney stones   . Kidney stones    several times, last  one 2014  . Mitral valve prolapse   . MVP (mitral valve prolapse)   . Pure hypercholesterolemia 05/02/2020  . Snoring 01/06/2020    Tobacco Use: Social History   Tobacco Use  Smoking Status Never Smoker  Smokeless Tobacco Never Used    Labs: Recent Review Flowsheet Data    Labs for ITP Cardiac and Pulmonary Rehab Latest Ref Rng & Units 01/16/2013 02/02/2019 12/07/2019 05/02/2020 05/05/2020   Cholestrol 100 - 199 mg/dL - 277(H) 250(H) 154 -   LDLCALC 0 - 99 mg/dL - 186(H) 176(H) 93 -   HDL >39 mg/dL - 54 54 42 -   Trlycerides 0 - 149 mg/dL - 184(H) 113 102 -   Hemoglobin A1c 4.8 - 5.6 % - 7.1(H) 6.8(H) - 7.2(H)   TCO2 0 - 100 mmol/L 23 - - - -      Capillary Blood Glucose: Lab Results  Component Value Date   GLUCAP 123 (H) 06/23/2020   GLUCAP 112 (H) 02/26/2020   GLUCAP 125 (H) 02/26/2020     Exercise Target Goals: Exercise Program Goal: Individual exercise prescription set using results from initial 6 min walk test and THRR while considering  patient's activity barriers and safety.   Exercise Prescription Goal: Starting with aerobic activity 30 plus minutes a day, 3 days per week for initial exercise  prescription. Provide home exercise prescription and guidelines that participant acknowledges understanding prior to discharge.  Activity Barriers & Risk Stratification:  Activity Barriers & Cardiac Risk Stratification - 06/23/20 1033      Activity Barriers & Cardiac Risk Stratification   Activity Barriers None    Cardiac Risk Stratification Low           6 Minute Walk:  6 Minute Walk    Row Name 06/23/20 1023         6 Minute Walk   Phase Initial     Distance 1407 feet     Walk Time 6 minutes     # of Rest Breaks 0     MPH 2.66     METS 3.49     RPE 11     Perceived Dyspnea  0     VO2 Peak 12.21     Symptoms No     Resting HR 72 bpm     Resting BP 120/62     Resting Oxygen  Saturation  99 %     Exercise Oxygen Saturation  during 6 min walk 98 %     Max Ex. HR 102 bpm     Max Ex. BP 136/80     2 Minute Post BP 120/80            Oxygen Initial Assessment:   Oxygen Re-Evaluation:   Oxygen Discharge (Final Oxygen Re-Evaluation):   Initial Exercise Prescription:  Initial Exercise Prescription - 06/23/20 1000      Date of Initial Exercise RX and Referring Provider   Date 06/23/20    Referring Provider Skeet Latch, MD    Expected Discharge Date 08/19/20      Treadmill   MPH 2.7    Grade 0    Minutes 15    METs 3.07      NuStep   Level 2    SPM 80    Minutes 15    METs 2      Prescription Details   Frequency (times per week) 3    Duration Progress to 30 minutes of continuous aerobic without signs/symptoms of physical distress      Intensity   THRR 40-80% of Max Heartrate 64-129    Ratings of Perceived Exertion 11-13    Perceived Dyspnea 0-4      Progression   Progression Continue progressive overload as per policy without signs/symptoms or physical distress.      Resistance Training   Training Prescription Yes    Weight 3    Reps 10-15           Perform Capillary Blood Glucose checks as needed.  Exercise Prescription Changes:   Exercise Comments:   Exercise Goals and Review:   Exercise Goals    Row Name 06/23/20 1035             Exercise Goals   Increase Physical Activity Yes       Intervention Provide advice, education, support and counseling about physical activity/exercise needs.;Develop an individualized exercise prescription for aerobic and resistive training based on initial evaluation findings, risk stratification, comorbidities and participant's personal goals.       Expected Outcomes Short Term: Attend rehab on a regular basis to increase amount of physical activity.;Long Term: Add in home exercise to make exercise part of routine and to increase amount of physical activity.;Long Term: Exercising  regularly at least 3-5 days a week.       Increase Strength and Stamina  Yes       Intervention Provide advice, education, support and counseling about physical activity/exercise needs.;Develop an individualized exercise prescription for aerobic and resistive training based on initial evaluation findings, risk stratification, comorbidities and participant's personal goals.       Expected Outcomes Short Term: Increase workloads from initial exercise prescription for resistance, speed, and METs.;Short Term: Perform resistance training exercises routinely during rehab and add in resistance training at home;Long Term: Improve cardiorespiratory fitness, muscular endurance and strength as measured by increased METs and functional capacity (6MWT)       Able to understand and use rate of perceived exertion (RPE) scale Yes       Intervention Provide education and explanation on how to use RPE scale       Expected Outcomes Short Term: Able to use RPE daily in rehab to express subjective intensity level;Long Term:  Able to use RPE to guide intensity level when exercising independently       Knowledge and understanding of Target Heart Rate Range (THRR) Yes       Intervention Provide education and explanation of THRR including how the numbers were predicted and where they are located for reference       Expected Outcomes Short Term: Able to state/look up THRR;Long Term: Able to use THRR to govern intensity when exercising independently;Short Term: Able to use daily as guideline for intensity in rehab       Able to check pulse independently Yes       Intervention Provide education and demonstration on how to check pulse in carotid and radial arteries.;Review the importance of being able to check your own pulse for safety during independent exercise       Expected Outcomes Short Term: Able to explain why pulse checking is important during independent exercise;Long Term: Able to check pulse independently and accurately        Understanding of Exercise Prescription Yes       Intervention Provide education, explanation, and written materials on patient's individual exercise prescription       Expected Outcomes Short Term: Able to explain program exercise prescription;Long Term: Able to explain home exercise prescription to exercise independently              Exercise Goals Re-Evaluation :    Discharge Exercise Prescription (Final Exercise Prescription Changes):   Nutrition:  Target Goals: Understanding of nutrition guidelines, daily intake of sodium 1500mg , cholesterol 200mg , calories 30% from fat and 7% or less from saturated fats, daily to have 5 or more servings of fruits and vegetables.  Biometrics:  Pre Biometrics - 06/23/20 0900      Pre Biometrics   Waist Circumference 40 inches    Hip Circumference 44.25 inches    Waist to Hip Ratio 0.9 %    Triceps Skinfold 28 mm    % Body Fat 31.7 %    Grip Strength 27 kg    Flexibility 13 in    Single Leg Stand 39.21 seconds            Nutrition Therapy Plan and Nutrition Goals:   Nutrition Assessments:   Nutrition Goals Re-Evaluation:   Nutrition Goals Discharge (Final Nutrition Goals Re-Evaluation):   Psychosocial: Target Goals: Acknowledge presence or absence of significant depression and/or stress, maximize coping skills, provide positive support system. Participant is able to verbalize types and ability to use techniques and skills needed for reducing stress and depression.  Initial Review & Psychosocial Screening:  Initial Psych Review & Screening -  06/23/20 0940      Initial Review   Current issues with None Identified      Family Dynamics   Good Support System? Yes   Jhoana lives alone but has support of her two sons one who lives in town, brothers and siters who live nearby     Barriers   Psychosocial barriers to participate in program There are no identifiable barriers or psychosocial needs.      Screening  Interventions   Interventions Encouraged to exercise           Quality of Life Scores:  Quality of Life - 06/23/20 1114      Quality of Life   Select Quality of Life      Quality of Life Scores   Health/Function Pre 21.86 %    Socioeconomic Pre 21.14 %    Psych/Spiritual Pre 21.5 %    Family Pre 22.5 %    GLOBAL Pre 21.7 %          Scores of 19 and below usually indicate a poorer quality of life in these areas.  A difference of  2-3 points is a clinically meaningful difference.  A difference of 2-3 points in the total score of the Quality of Life Index has been associated with significant improvement in overall quality of life, self-image, physical symptoms, and general health in studies assessing change in quality of life.  PHQ-9: Recent Review Flowsheet Data    Depression screen [facility] 2/9 06/23/2020 02/04/2020 12/07/2019 02/02/2019   Decreased Interest 0 0 0 0   Down, Depressed, Hopeless 0 0 0 0   PHQ - 2 Score 0 0 0 0     Interpretation of Total Score  Total Score Depression Severity:  1-4 = Minimal depression, 5-9 = Mild depression, 10-14 = Moderate depression, 15-19 = Moderately severe depression, 20-27 = Severe depression   Psychosocial Evaluation and Intervention:   Psychosocial Re-Evaluation:   Psychosocial Discharge (Final Psychosocial Re-Evaluation):   Vocational Rehabilitation: Provide vocational rehab assistance to qualifying candidates.   Vocational Rehab Evaluation & Intervention:  Vocational Rehab - 06/23/20 0941      Initial Vocational Rehab Evaluation & Intervention   Assessment shows need for Vocational Rehabilitation No   Myleigh works full time and does not need vocational rehab at this time          Education: Education Goals: Education classes will be provided on a weekly basis, covering required topics. Participant will state understanding/return demonstration of topics presented.  Learning Barriers/Preferences:  Learning  Barriers/Preferences - 06/23/20 1112      Learning Barriers/Preferences   Learning Barriers None    Learning Preferences Video;Pictoral           Education Topics: Hypertension, Hypertension Reduction -Define heart disease and high blood pressure. Discus how high blood pressure affects the body and ways to reduce high blood pressure.   Exercise and Your Heart -Discuss why it is important to exercise, the FITT principles of exercise, normal and abnormal responses to exercise, and how to exercise safely.   Angina -Discuss definition of angina, causes of angina, treatment of angina, and how to decrease risk of having angina.   Cardiac Medications -Review what the following cardiac medications are used for, how they affect the body, and side effects that may occur when taking the medications.  Medications include Aspirin, Beta blockers, calcium channel blockers, ACE Inhibitors, angiotensin receptor blockers, diuretics, digoxin, and antihyperlipidemics.   Congestive Heart Failure -Discuss the definition of CHF,  how to live with CHF, the signs and symptoms of CHF, and how keep track of weight and sodium intake.   Heart Disease and Intimacy -Discus the effect sexual activity has on the heart, how changes occur during intimacy as we age, and safety during sexual activity.   Smoking Cessation / COPD -Discuss different methods to quit smoking, the health benefits of quitting smoking, and the definition of COPD.   Nutrition I: Fats -Discuss the types of cholesterol, what cholesterol does to the heart, and how cholesterol levels can be controlled.   Nutrition II: Labels -Discuss the different components of food labels and how to read food label   Heart Parts/Heart Disease and PAD -Discuss the anatomy of the heart, the pathway of blood circulation through the heart, and these are affected by heart disease.   Stress I: Signs and Symptoms -Discuss the causes of stress, how stress  may lead to anxiety and depression, and ways to limit stress.   Stress II: Relaxation -Discuss different types of relaxation techniques to limit stress.   Warning Signs of Stroke / TIA -Discuss definition of a stroke, what the signs and symptoms are of a stroke, and how to identify when someone is having stroke.   Knowledge Questionnaire Score:  Knowledge Questionnaire Score - 06/23/20 1111      Knowledge Questionnaire Score   Pre Score 18/24           Core Components/Risk Factors/Patient Goals at Admission:  Personal Goals and Risk Factors at Admission - 06/23/20 1353      Core Components/Risk Factors/Patient Goals on Admission    Weight Management Yes;Obesity;Weight Loss    Intervention Weight Management: Develop a combined nutrition and exercise program designed to reach desired caloric intake, while maintaining appropriate intake of nutrient and fiber, sodium and fats, and appropriate energy expenditure required for the weight goal.;Weight Management: Provide education and appropriate resources to help participant work on and attain dietary goals.;Weight Management/Obesity: Establish reasonable short term and long term weight goals.;Obesity: Provide education and appropriate resources to help participant work on and attain dietary goals.    Admit Weight 1765 lb 14.2 oz (801 kg)    Expected Outcomes Short Term: Continue to assess and modify interventions until short term weight is achieved;Long Term: Adherence to nutrition and physical activity/exercise program aimed toward attainment of established weight goal;Understanding of distribution of calorie intake throughout the day with the consumption of 4-5 meals/snacks;Understanding recommendations for meals to include 15-35% energy as protein, 25-35% energy from fat, 35-60% energy from carbohydrates, less than 200mg  of dietary cholesterol, 20-35 gm of total fiber daily;Weight Loss: Understanding of general recommendations for a balanced  deficit meal plan, which promotes 1-2 lb weight loss per week and includes a negative energy balance of [phone] kcal/d;Weight Maintenance: Understanding of the daily nutrition guidelines, which includes 25-35% calories from fat, 7% or less cal from saturated fats, less than 200mg  cholesterol, less than 1.5gm of sodium, & 5 or more servings of fruits and vegetables daily    Diabetes Yes    Intervention Provide education about signs/symptoms and action to take for hypo/hyperglycemia.;Provide education about proper nutrition, including hydration, and aerobic/resistive exercise prescription along with prescribed medications to achieve blood glucose in normal ranges: Fasting glucose 65-99 mg/dL    Expected Outcomes Long Term: Attainment of HbA1C < 7%.;Short Term: Participant verbalizes understanding of the signs/symptoms and immediate care of hyper/hypoglycemia, proper foot care and importance of medication, aerobic/resistive exercise and nutrition plan for blood glucose control.  Hypertension Yes    Intervention Provide education on lifestyle modifcations including regular physical activity/exercise, weight management, moderate sodium restriction and increased consumption of fresh fruit, vegetables, and low fat dairy, alcohol moderation, and smoking cessation.;Monitor prescription use compliance.    Expected Outcomes Short Term: Continued assessment and intervention until BP is < 140/77mm HG in hypertensive participants. < 130/67mm HG in hypertensive participants with diabetes, heart failure or chronic kidney disease.;Long Term: Maintenance of blood pressure at goal levels.    Lipids Yes    Intervention Provide education and support for participant on nutrition & aerobic/resistive exercise along with prescribed medications to achieve LDL 70mg , HDL >40mg .    Expected Outcomes Short Term: Participant states understanding of desired cholesterol values and is compliant with medications prescribed. Participant  is following exercise prescription and nutrition guidelines.;Long Term: Cholesterol controlled with medications as prescribed, with individualized exercise RX and with personalized nutrition plan. Value goals: LDL < 70mg , HDL > 40 mg.    Stress Yes    Intervention Offer individual and/or small group education and counseling on adjustment to heart disease, stress management and health-related lifestyle change. Teach and support self-help strategies.;Refer participants experiencing significant psychosocial distress to appropriate mental health specialists for further evaluation and treatment. When possible, include family members and significant others in education/counseling sessions.    Expected Outcomes Short Term: Participant demonstrates changes in health-related behavior, relaxation and other stress management skills, ability to obtain effective social support, and compliance with psychotropic medications if prescribed.;Long Term: Emotional wellbeing is indicated by absence of clinically significant psychosocial distress or social isolation.           Core Components/Risk Factors/Patient Goals Review:    Core Components/Risk Factors/Patient Goals at Discharge (Final Review):    ITP Comments:  ITP Comments    Row Name 06/23/20 0939           ITP Comments Dr Fransico Him MD, Medical Director              Comments: Mary Holt attended orientation on 06/23/2020 to review rules and guidelines for program.  Completed 6 minute walk test, Intitial ITP, and exercise prescription.  VSS. Telemetry-Sinus Rhythm.  Asymptomatic. Safety measures and social distancing in place per CDC guidelines.Barnet Pall, RN,BSN 06/23/2020 2:01 PM

## 2020-06-29 ENCOUNTER — Telehealth: Payer: Self-pay

## 2020-06-29 ENCOUNTER — Other Ambulatory Visit: Payer: Self-pay

## 2020-06-29 ENCOUNTER — Encounter (HOSPITAL_COMMUNITY)
Admission: RE | Admit: 2020-06-29 | Discharge: 2020-06-29 | Disposition: A | Discharge status: Home / Self Care | Payer: 59 | Source: Ambulatory Visit | Attending: Cardiovascular Disease | Admitting: Cardiovascular Disease

## 2020-06-29 DIAGNOSIS — Z955 Presence of coronary angioplasty implant and graft: Secondary | ICD-10-CM

## 2020-06-29 LAB — GLUCOSE, CAPILLARY
Glucose-Capillary: 184 mg/dL — ABNORMAL HIGH (ref 70–99)
Glucose-Capillary: 137 mg/dL — ABNORMAL HIGH (ref 70–99)

## 2020-06-29 NOTE — Telephone Encounter (Signed)
Called and spoke w/pt and stated that they were approved for the nexletol and they stated that they were already notified that they were approved but they haven't received it yet and that they also plan to complete lipid panel blood work in a couple of weeks

## 2020-06-29 NOTE — Progress Notes (Signed)
Daily Session Note  Patient Details  Name: Mary Holt MRN: [id number] Date of Birth: 07/31/1961 Referring Provider:     CARDIAC REHAB PHASE II ORIENTATION from 06/23/2020 in [place]  Referring Provider Skeet Latch, MD      Encounter Date: 06/29/2020  Check In:  Session Check In - 06/29/20 0705      Check-In   Supervising physician immediately available to respond to emergencies [facility] immediately available    Physician(s) Dr. Avon Gully    Location [facility]    Staff Present Dorma Russell, MS,ACSM CEP, Exercise Physiologist;Patricie Geeslin Rollene Rotunda, RN, Milus Glazier, MS, EP-C, CCRP;Ramon Dredge, RN, MHA    Virtual Visit No    Medication changes reported     No    Fall or balance concerns reported    No    Tobacco Cessation No Change    Warm-up and Cool-down Performed on first and last piece of equipment    Resistance Training Performed No    VAD Patient? No    PAD/SET Patient? No      Pain Assessment   Currently in Pain? No/denies    Pain Score 0-No pain    Multiple Pain Sites No           Capillary Blood Glucose: Results for orders placed or performed during the hospital encounter of 06/29/20 (from the past 24 hour(s))  Glucose, capillary     Status: Abnormal   Collection Time: 06/29/20  7:08 AM  Result Value Ref Range   Glucose-Capillary 184 (H) 70 - 99 mg/dL  Glucose, capillary     Status: Abnormal   Collection Time: 06/29/20  7:59 AM  Result Value Ref Range   Glucose-Capillary 137 (H) 70 - 99 mg/dL      Social History   Tobacco Use  Smoking Status Never Smoker  Smokeless Tobacco Never Used    Goals Met:  Exercise tolerated well Personal goals reviewed No report of cardiac concerns or symptoms  Goals Unmet:  Not Applicable  Comments: Pt started cardiac rehab today.  Pt tolerated light exercise without difficulty. VSS, telemetry-NSR, asymptomatic.  Medication list reconciled.  Pt denies barriers to medicaiton compliance.  PSYCHOSOCIAL ASSESSMENT:  PHQ-0. Pt exhibits positive coping skills, hopeful outlook with supportive family. No psychosocial needs identified at this time, no psychosocial interventions necessary.  Pt oriented to exercise equipment and routine. Understanding verbalized.   Dr. Fransico Him is Medical Director for Cardiac Rehab at [facility].

## 2020-07-01 ENCOUNTER — Encounter (HOSPITAL_COMMUNITY)
Admission: RE | Admit: 2020-07-01 | Discharge: 2020-07-01 | Disposition: A | Discharge status: Home / Self Care | Payer: 59 | Source: Ambulatory Visit | Attending: Cardiovascular Disease | Admitting: Cardiovascular Disease

## 2020-07-01 ENCOUNTER — Other Ambulatory Visit: Payer: Self-pay

## 2020-07-01 DIAGNOSIS — Z955 Presence of coronary angioplasty implant and graft: Secondary | ICD-10-CM | POA: Diagnosis not present

## 2020-07-01 LAB — GLUCOSE, CAPILLARY: Glucose-Capillary: 156 mg/dL — ABNORMAL HIGH (ref 70–99)

## 2020-07-01 NOTE — Progress Notes (Signed)
Mary Holt 59 y.o. female Nutrition Note  Visit Diagnosis: S/P DES OM2, 2nd Diagonal 03/07/20   Past Medical History:  Diagnosis Date  . Angina pectoris ([place]) 02/22/2020  . CAD in native artery 02/22/2020  . Chest pain of uncertain etiology 7/65/4650  . Diabetes mellitus without complication ([place])   . Heart murmur    asymptomatic  . Hyperlipidemia   . Hypertension   . Kidney stones   . Kidney stones    several times, last  one 2014  . Mitral valve prolapse   . MVP (mitral valve prolapse)   . Pure hypercholesterolemia 05/02/2020  . Snoring 01/06/2020     Medications reviewed.   Current Outpatient Medications:  .  aspirin EC 81 MG tablet, Take 1 tablet (81 mg total) by mouth daily., Disp: 90 tablet, Rfl: 3 .  Azilsartan-Chlorthalidone (EDARBYCLOR) 40-12.5 MG TABS, Take 1 tablet by mouth daily., Disp: 90 tablet, Rfl: 1 .  Bempedoic Acid 180 MG TABS, Take 1 tablet by mouth daily., Disp: 90 tablet, Rfl: 1 .  carvedilol (COREG) 3.125 MG tablet, Take 1 tablet (3.125 mg total) by mouth 2 (two) times daily., Disp: 60 tablet, Rfl: 3 .  clindamycin (CLEOCIN) 300 MG capsule, Take 2 tabs by mouth 30-60 minutes before dental procedure (Patient not taking: Reported on 05/05/2020), Disp: 10 capsule, Rfl: 0 .  clopidogrel (PLAVIX) 75 MG tablet, Take 1 tablet (75 mg total) by mouth daily with breakfast., Disp: 90 tablet, Rfl: 1 .  metFORMIN (GLUCOPHAGE) 500 MG tablet, Take 1 tablet (500 mg total) by mouth 2 (two) times daily with a meal. Resume on 02/29/20., Disp: 180 tablet, Rfl: 1 .  mometasone (ELOCON) 0.1 % cream, Apply to affected area daily, Disp: 45 g, Rfl: 2 .  rosuvastatin (CRESTOR) 40 MG tablet, Take 1 tablet (40 mg total) by mouth daily at 6 PM., Disp: 90 tablet, Rfl: 1   Ht Readings from Last 1 Encounters:  06/23/20 5' 3.25" (1.607 m)     Wt Readings from Last 3 Encounters:  06/23/20 176 lb 9.4 oz (80.1 kg)  06/09/20 178 lb 12.8 oz (81.1 kg)  05/05/20 178 lb 6.4 oz (80.9 kg)      There is no height or weight on file to calculate BMI.   Social History   Tobacco Use  Smoking Status Never Smoker  Smokeless Tobacco Never Used     Lab Results  Component Value Date   CHOL 154 05/02/2020   Lab Results  Component Value Date   HDL 42 05/02/2020   Lab Results  Component Value Date   LDLCALC 93 05/02/2020   Lab Results  Component Value Date   TRIG 102 05/02/2020     Lab Results  Component Value Date   HGBA1C 7.2 (H) 05/05/2020     CBG (last 3)  Recent Labs    06/29/20 0708 06/29/20 0759  GLUCAP 184* 137*     Nutrition Note  Spoke with pt. Nutrition Plan and Nutrition Survey goals reviewed with pt.   Pt has Type 2 Diabetes. Last A1c indicates blood glucose well-controlled. Pt does not check CBGs. She was unsure if she had diabetes but reported that her Metformin was recently increased from 500 BID to 1000 BID.  Her CBG starting exercise today was 156 mg/dl. We reviewed A1C and what that number means.  We also reviewed LDL and ways to reduce.  Discussed lifestyle changes.  Pt works 2nd shift. Typical eating pattern includes: B- none L- 11:30-12 Leftovers,  salad with cheese and light ranch D- (~8 pm) Chinese,small piece chocolate cake S-(when she gets home ~midnight) honey nut cheerios with milk  She eats small portions. She has eliminated sugary beverages and avoids fried foods and tries to eat food from home. She does not eat out often.   Will follow up with pt next week.   Pt expressed understanding of the information reviewed.    Nutrition Diagnosis ? Excessive carbohydrate intake related to food preferences and lack of food related knowledge as evidenced by A1C 7.2  Nutrition Intervention ? Pt's individual nutrition plan reviewed with pt. ? Benefits of adopting Heart Healthy diet discussed when Medficts reviewed.   ? Continue client-centered nutrition education by RD, as part of interdisciplinary care.  Goal(s) ? Pt to  build a healthy plate including vegetables, fruits, whole grains, and low-fat dairy products in a heart healthy meal plan. ? Improved blood glucose control as evidenced by pt's A1c trending from 7.2 toward less than 7.0. ? Pt able to name foods that affect blood glucose  Plan:   Will provide client-centered nutrition education as part of interdisciplinary care  Monitor and evaluate progress toward nutrition goal with team.   Michaele Offer, MS, RDN, LDN

## 2020-07-04 ENCOUNTER — Other Ambulatory Visit: Payer: Self-pay

## 2020-07-04 ENCOUNTER — Encounter (HOSPITAL_COMMUNITY)
Admission: RE | Admit: 2020-07-04 | Discharge: 2020-07-04 | Disposition: A | Discharge status: Home / Self Care | Payer: 59 | Source: Ambulatory Visit | Attending: Cardiovascular Disease | Admitting: Cardiovascular Disease

## 2020-07-04 DIAGNOSIS — Z955 Presence of coronary angioplasty implant and graft: Secondary | ICD-10-CM

## 2020-07-04 LAB — GLUCOSE, CAPILLARY: Glucose-Capillary: 176 mg/dL — ABNORMAL HIGH (ref 70–99)

## 2020-07-04 NOTE — Progress Notes (Signed)
Nutrition Note  Spoke with pt today about diabetes management. Reviewed A1C and what that number means as well as target of <7%. Reviewed target fasting blood sugars and 2 hour post prandial. Pt has never received nutrition for diabetes management. Provided education on macronutrients and carbohydrates impact on blood sugar. Discussed fiber rich carb choices and benefit of fiber on blood sugars.  Discussed the importance of creating a balanced meal with the addition of protein and non-starchy vegetables. Provided suggestions for meals and snacks. Pt verbalized understanding of material discussed today. Distributed RD contact information.     Nutrition Diagnosis   Excessive carbohydrate intake related to food preferences and lack of food related knowledge as evidenced by A1C 7.2  Nutrition Intervention   Pt's individual nutrition plan reviewed with pt.  Benefits of adopting Heart Healthy diet discussed when Medficts reviewed.                  Continue client-centered nutrition education by RD, as part of interdisciplinary care.  Goal(s)  Pt to build a healthy plate including vegetables, fruits, whole grains, and low-fat dairy products in a heart healthy meal plan.  Improved blood glucose control as evidenced by pt's A1c trending from 7.2 toward less than 7.0.  Pt able to name foods that affect blood glucose  Plan:  Will provide client-centered nutrition education as part of interdisciplinary care  Monitor and evaluate progress toward nutrition goal with team.  Michaele Offer, MS, RDN, LDN

## 2020-07-05 LAB — GLUCOSE, CAPILLARY: Glucose-Capillary: 140 mg/dL — ABNORMAL HIGH (ref 70–99)

## 2020-07-06 ENCOUNTER — Encounter (HOSPITAL_COMMUNITY)
Admission: RE | Admit: 2020-07-06 | Discharge: 2020-07-06 | Disposition: A | Discharge status: Home / Self Care | Payer: 59 | Source: Ambulatory Visit | Attending: Cardiovascular Disease | Admitting: Cardiovascular Disease

## 2020-07-06 ENCOUNTER — Other Ambulatory Visit: Payer: Self-pay

## 2020-07-06 DIAGNOSIS — Z955 Presence of coronary angioplasty implant and graft: Secondary | ICD-10-CM

## 2020-07-08 ENCOUNTER — Other Ambulatory Visit: Payer: Self-pay

## 2020-07-08 ENCOUNTER — Encounter (HOSPITAL_COMMUNITY)
Admission: RE | Admit: 2020-07-08 | Discharge: 2020-07-08 | Disposition: A | Discharge status: Home / Self Care | Payer: 59 | Source: Ambulatory Visit | Attending: Cardiovascular Disease | Admitting: Cardiovascular Disease

## 2020-07-08 DIAGNOSIS — Z955 Presence of coronary angioplasty implant and graft: Secondary | ICD-10-CM

## 2020-07-11 ENCOUNTER — Encounter (HOSPITAL_COMMUNITY): Payer: 59

## 2020-07-11 ENCOUNTER — Encounter (HOSPITAL_COMMUNITY): Payer: Self-pay

## 2020-07-11 DIAGNOSIS — Z955 Presence of coronary angioplasty implant and graft: Secondary | ICD-10-CM

## 2020-07-11 NOTE — Progress Notes (Signed)
Cardiac Individual Treatment Plan  Patient Details  Name: ELLIEMAE BRAMAN MRN: [id number] Date of Birth: 1961/03/28 Referring Provider:     CARDIAC REHAB PHASE II ORIENTATION from 06/23/2020 in [place]  Referring Provider Skeet Latch, MD      Initial Encounter Date:    CARDIAC REHAB PHASE II ORIENTATION from 06/23/2020 in [place]  Date 06/23/20      Visit Diagnosis: S/P DES OM2, 2nd Diagonal 03/07/20  Patient's Home Medications on Admission:  Current Outpatient Medications:  .  aspirin EC 81 MG tablet, Take 1 tablet (81 mg total) by mouth daily., Disp: 90 tablet, Rfl: 3 .  Azilsartan-Chlorthalidone (EDARBYCLOR) 40-12.5 MG TABS, Take 1 tablet by mouth daily., Disp: 90 tablet, Rfl: 1 .  Bempedoic Acid 180 MG TABS, Take 1 tablet by mouth daily., Disp: 90 tablet, Rfl: 1 .  carvedilol (COREG) 3.125 MG tablet, Take 1 tablet (3.125 mg total) by mouth 2 (two) times daily., Disp: 60 tablet, Rfl: 3 .  clindamycin (CLEOCIN) 300 MG capsule, Take 2 tabs by mouth 30-60 minutes before dental procedure (Patient not taking: Reported on 05/05/2020), Disp: 10 capsule, Rfl: 0 .  clopidogrel (PLAVIX) 75 MG tablet, Take 1 tablet (75 mg total) by mouth daily with breakfast., Disp: 90 tablet, Rfl: 1 .  metFORMIN (GLUCOPHAGE) 500 MG tablet, Take 1 tablet (500 mg total) by mouth 2 (two) times daily with a meal. Resume on 02/29/20., Disp: 180 tablet, Rfl: 1 .  mometasone (ELOCON) 0.1 % cream, Apply to affected area daily, Disp: 45 g, Rfl: 2 .  rosuvastatin (CRESTOR) 40 MG tablet, Take 1 tablet (40 mg total) by mouth daily at 6 PM., Disp: 90 tablet, Rfl: 1  Past Medical History: Past Medical History:  Diagnosis Date  . Angina pectoris ([place]) 02/22/2020  . CAD in native artery 02/22/2020  . Chest pain of uncertain etiology 0/73/7106  . Diabetes mellitus without complication ([place])   . Heart murmur    asymptomatic  . Hyperlipidemia   .  Hypertension   . Kidney stones   . Kidney stones    several times, last  one 2014  . Mitral valve prolapse   . MVP (mitral valve prolapse)   . Pure hypercholesterolemia 05/02/2020  . Snoring 01/06/2020    Tobacco Use: Social History   Tobacco Use  Smoking Status Never Smoker  Smokeless Tobacco Never Used    Labs: Recent Review Flowsheet Data    Labs for ITP Cardiac and Pulmonary Rehab Latest Ref Rng & Units 01/16/2013 02/02/2019 12/07/2019 05/02/2020 05/05/2020   Cholestrol 100 - 199 mg/dL - 277(H) 250(H) 154 -   LDLCALC 0 - 99 mg/dL - 186(H) 176(H) 93 -   HDL >39 mg/dL - 54 54 42 -   Trlycerides 0 - 149 mg/dL - 184(H) 113 102 -   Hemoglobin A1c 4.8 - 5.6 % - 7.1(H) 6.8(H) - 7.2(H)   TCO2 0 - 100 mmol/L 23 - - - -      Capillary Blood Glucose: Lab Results  Component Value Date   GLUCAP 140 (H) 07/04/2020   GLUCAP 176 (H) 07/04/2020   GLUCAP 156 (H) 07/01/2020   GLUCAP 137 (H) 06/29/2020   GLUCAP 184 (H) 06/29/2020     Exercise Target Goals: Exercise Program Goal: Individual exercise prescription set using results from initial 6 min walk test and THRR while considering  patient's activity barriers and safety.   Exercise Prescription Goal: Initial exercise prescription builds  to 30-45 minutes a day of aerobic activity, 2-3 days per week.  Home exercise guidelines will be given to patient during program as part of exercise prescription that the participant will acknowledge.  Activity Barriers & Risk Stratification:  Activity Barriers & Cardiac Risk Stratification - 06/23/20 1033      Activity Barriers & Cardiac Risk Stratification   Activity Barriers None    Cardiac Risk Stratification Low           6 Minute Walk:  6 Minute Walk    Row Name 06/23/20 1023         6 Minute Walk   Phase Initial     Distance 1407 feet     Walk Time 6 minutes     # of Rest Breaks 0     MPH 2.66     METS 3.49     RPE 11     Perceived Dyspnea  0     VO2 Peak 12.21      Symptoms No     Resting HR 72 bpm     Resting BP 120/62     Resting Oxygen Saturation  99 %     Exercise Oxygen Saturation  during 6 min walk 98 %     Max Ex. HR 102 bpm     Max Ex. BP 136/80     2 Minute Post BP 120/80            Oxygen Initial Assessment:   Oxygen Re-Evaluation:   Oxygen Discharge (Final Oxygen Re-Evaluation):   Initial Exercise Prescription:  Initial Exercise Prescription - 06/23/20 1000      Date of Initial Exercise RX and Referring Provider   Date 06/23/20    Referring Provider Skeet Latch, MD    Expected Discharge Date 08/19/20      Treadmill   MPH 2.7    Grade 0    Minutes 15    METs 3.07      NuStep   Level 2    SPM 80    Minutes 15    METs 2      Prescription Details   Frequency (times per week) 3    Duration Progress to 30 minutes of continuous aerobic without signs/symptoms of physical distress      Intensity   THRR 40-80% of Max Heartrate 64-129    Ratings of Perceived Exertion 11-13    Perceived Dyspnea 0-4      Progression   Progression Continue progressive overload as per policy without signs/symptoms or physical distress.      Resistance Training   Training Prescription Yes    Weight 3    Reps 10-15           Perform Capillary Blood Glucose checks as needed.  Exercise Prescription Changes:   Exercise Prescription Changes    Row Name 06/29/20 0720 07/06/20 0710           Response to Exercise   Blood Pressure (Admit) 118/68 102/56      Blood Pressure (Exercise) 130/70 116/70      Blood Pressure (Exit) 112/70 98/58      Heart Rate (Admit) 94 bpm 90 bpm      Heart Rate (Exercise) 104 bpm 112 bpm      Heart Rate (Exit) 76 bpm 85 bpm      Rating of Perceived Exertion (Exercise) 11 11      Symptoms none --      Comments Off to a good  start with exercise. --      Duration Progress to 30 minutes of  aerobic without signs/symptoms of physical distress Progress to 30 minutes of  aerobic without  signs/symptoms of physical distress      Intensity THRR unchanged THRR unchanged        Progression   Progression Continue to progress workloads to maintain intensity without signs/symptoms of physical distress. Continue to progress workloads to maintain intensity without signs/symptoms of physical distress.      Average METs 2.2 2.4        Resistance Training   Training Prescription No No        Interval Training   Interval Training No No        Treadmill   MPH -- --      Grade -- --      Minutes -- --      METs -- --        NuStep   Level 2 2      SPM 80 80      Minutes 15 15      METs 2 2        Track   Laps 12 16      Minutes 15 15      METs 2.39 2.86             Exercise Comments:   Exercise Comments    Row Name 07/06/20 0745           Exercise Comments Reviewed METs and goals with patient.              Exercise Goals and Review:   Exercise Goals    Row Name 06/23/20 1035             Exercise Goals   Increase Physical Activity Yes       Intervention Provide advice, education, support and counseling about physical activity/exercise needs.;Develop an individualized exercise prescription for aerobic and resistive training based on initial evaluation findings, risk stratification, comorbidities and participant's personal goals.       Expected Outcomes Short Term: Attend rehab on a regular basis to increase amount of physical activity.;Long Term: Add in home exercise to make exercise part of routine and to increase amount of physical activity.;Long Term: Exercising regularly at least 3-5 days a week.       Increase Strength and Stamina Yes       Intervention Provide advice, education, support and counseling about physical activity/exercise needs.;Develop an individualized exercise prescription for aerobic and resistive training based on initial evaluation findings, risk stratification, comorbidities and participant's personal goals.       Expected  Outcomes Short Term: Increase workloads from initial exercise prescription for resistance, speed, and METs.;Short Term: Perform resistance training exercises routinely during rehab and add in resistance training at home;Long Term: Improve cardiorespiratory fitness, muscular endurance and strength as measured by increased METs and functional capacity (6MWT)       Able to understand and use rate of perceived exertion (RPE) scale Yes       Intervention Provide education and explanation on how to use RPE scale       Expected Outcomes Short Term: Able to use RPE daily in rehab to express subjective intensity level;Long Term:  Able to use RPE to guide intensity level when exercising independently       Knowledge and understanding of Target Heart Rate Range (THRR) Yes       Intervention Provide education and explanation  of THRR including how the numbers were predicted and where they are located for reference       Expected Outcomes Short Term: Able to state/look up THRR;Long Term: Able to use THRR to govern intensity when exercising independently;Short Term: Able to use daily as guideline for intensity in rehab       Able to check pulse independently Yes       Intervention Provide education and demonstration on how to check pulse in carotid and radial arteries.;Review the importance of being able to check your own pulse for safety during independent exercise       Expected Outcomes Short Term: Able to explain why pulse checking is important during independent exercise;Long Term: Able to check pulse independently and accurately       Understanding of Exercise Prescription Yes       Intervention Provide education, explanation, and written materials on patient's individual exercise prescription       Expected Outcomes Short Term: Able to explain program exercise prescription;Long Term: Able to explain home exercise prescription to exercise independently              Exercise Goals Re-Evaluation :  Exercise  Goals Re-Evaluation    Row Name 07/06/20 0745             Exercise Goal Re-Evaluation   Exercise Goals Review Increase Physical Activity;Able to understand and use rate of perceived exertion (RPE) scale       Comments Patient able to understand and use RPE scale appropriately. Patient is not exericsing at home at this time because of work schedule. Patient's goal is to start and continue walking at home.       Expected Outcomes Patient will add 1-2 days/week walking at home in addition to exericse at cardiac rehab.              Discharge Exercise Prescription (Final Exercise Prescription Changes):  Exercise Prescription Changes - 07/06/20 0710      Response to Exercise   Blood Pressure (Admit) 102/56    Blood Pressure (Exercise) 116/70    Blood Pressure (Exit) 98/58    Heart Rate (Admit) 90 bpm    Heart Rate (Exercise) 112 bpm    Heart Rate (Exit) 85 bpm    Rating of Perceived Exertion (Exercise) 11    Duration Progress to 30 minutes of  aerobic without signs/symptoms of physical distress    Intensity THRR unchanged      Progression   Progression Continue to progress workloads to maintain intensity without signs/symptoms of physical distress.    Average METs 2.4      Resistance Training   Training Prescription No      Interval Training   Interval Training No      Treadmill   MPH --    Grade --    Minutes --    METs --      NuStep   Level 2    SPM 80    Minutes 15    METs 2      Track   Laps 16    Minutes 15    METs 2.86           Nutrition:  Target Goals: Understanding of nutrition guidelines, daily intake of sodium 1500mg , cholesterol 200mg , calories 30% from fat and 7% or less from saturated fats, daily to have 5 or more servings of fruits and vegetables.  Biometrics:  Pre Biometrics - 06/23/20 0900      Pre  Biometrics   Waist Circumference 40 inches    Hip Circumference 44.25 inches    Waist to Hip Ratio 0.9 %    Triceps Skinfold 28 mm    %  Body Fat 31.7 %    Grip Strength 27 kg    Flexibility 13 in    Single Leg Stand 39.21 seconds            Nutrition Therapy Plan and Nutrition Goals:  Nutrition Therapy & Goals - 07/05/20 0835      Nutrition Therapy   Diet Heart Healthy/Carb modified      Personal Nutrition Goals   Nutrition Goal Improved blood glucose control as evidenced by pt's A1c trending from 7.2 toward less than 7.0.    Personal Goal #2 Pt to build a healthy plate including vegetables, fruits, whole grains, and low-fat dairy products in a heart healthy meal plan.    Personal Goal #3 Pt able to name foods that affect blood glucose      Intervention Plan   Intervention Prescribe, educate and counsel regarding individualized specific dietary modifications aiming towards targeted core components such as weight, hypertension, lipid management, diabetes, heart failure and other comorbidities.;Nutrition handout(s) given to patient.    Expected Outcomes Short Term Goal: A plan has been developed with personal nutrition goals set during dietitian appointment.;Long Term Goal: Adherence to prescribed nutrition plan.           Nutrition Assessments:   Nutrition Goals Re-Evaluation:  Nutrition Goals Re-Evaluation    [place] Name 07/05/20 0836             Goals   Current Weight 177 lb 11.1 oz (80.6 kg)       Nutrition Goal Improved blood glucose control as evidenced by pt's A1c trending from 7.2 toward less than 7.0.         Personal Goal #2 Re-Evaluation   Personal Goal #2 Pt to build a healthy plate including vegetables, fruits, whole grains, and low-fat dairy products in a heart healthy meal plan.         Personal Goal #3 Re-Evaluation   Personal Goal #3 Pt able to name foods that affect blood glucose              Nutrition Goals Re-Evaluation:  Nutrition Goals Re-Evaluation    [place] Name 07/05/20 0836             Goals   Current Weight 177 lb 11.1 oz (80.6 kg)       Nutrition Goal Improved blood  glucose control as evidenced by pt's A1c trending from 7.2 toward less than 7.0.         Personal Goal #2 Re-Evaluation   Personal Goal #2 Pt to build a healthy plate including vegetables, fruits, whole grains, and low-fat dairy products in a heart healthy meal plan.         Personal Goal #3 Re-Evaluation   Personal Goal #3 Pt able to name foods that affect blood glucose              Nutrition Goals Discharge (Final Nutrition Goals Re-Evaluation):  Nutrition Goals Re-Evaluation - 07/05/20 0836      Goals   Current Weight 177 lb 11.1 oz (80.6 kg)    Nutrition Goal Improved blood glucose control as evidenced by pt's A1c trending from 7.2 toward less than 7.0.      Personal Goal #2 Re-Evaluation   Personal Goal #2 Pt to build a healthy plate including vegetables,  fruits, whole grains, and low-fat dairy products in a heart healthy meal plan.      Personal Goal #3 Re-Evaluation   Personal Goal #3 Pt able to name foods that affect blood glucose           Psychosocial: Target Goals: Acknowledge presence or absence of significant depression and/or stress, maximize coping skills, provide positive support system. Participant is able to verbalize types and ability to use techniques and skills needed for reducing stress and depression.  Initial Review & Psychosocial Screening:  Initial Psych Review & Screening - 06/23/20 0940      Initial Review   Current issues with None Identified      Family Dynamics   Good Support System? Yes   Rahcel lives alone but has support of her two sons one who lives in town, brothers and siters who live nearby     Barriers   Psychosocial barriers to participate in program There are no identifiable barriers or psychosocial needs.      Screening Interventions   Interventions Encouraged to exercise           Quality of Life Scores:  Quality of Life - 06/23/20 1114      Quality of Life   Select Quality of Life      Quality of Life Scores    Health/Function Pre 21.86 %    Socioeconomic Pre 21.14 %    Psych/Spiritual Pre 21.5 %    Family Pre 22.5 %    GLOBAL Pre 21.7 %          Scores of 19 and below usually indicate a poorer quality of life in these areas.  A difference of  2-3 points is a clinically meaningful difference.  A difference of 2-3 points in the total score of the Quality of Life Index has been associated with significant improvement in overall quality of life, self-image, physical symptoms, and general health in studies assessing change in quality of life.  PHQ-9: Recent Review Flowsheet Data    Depression screen [facility] 2/9 06/23/2020 02/04/2020 12/07/2019 02/02/2019   Decreased Interest 0 0 0 0   Down, Depressed, Hopeless 0 0 0 0   PHQ - 2 Score 0 0 0 0     Interpretation of Total Score  Total Score Depression Severity:  1-4 = Minimal depression, 5-9 = Mild depression, 10-14 = Moderate depression, 15-19 = Moderately severe depression, 20-27 = Severe depression   Psychosocial Evaluation and Intervention:  Psychosocial Evaluation - 06/29/20 1311      Psychosocial Evaluation & Interventions   Interventions Encouraged to exercise with the program and follow exercise prescription    Comments Ms. Ruppert denies psychosocial barriers to participation in [facility] or self health management. She continues to work full time 2nd shift but makes her health and attending cardiac rehab a priority. She endorses a strong support system and enjoys reading and working in the yard. No interventions needed at this time.    Expected Outcomes Ms. Elsayed will continue to maintain a positive attitude and outlook. She will continue to denie psychosocial barriers to participation in CR and self health management.    Continue Psychosocial Services  No Follow up required           Psychosocial Re-Evaluation:  Psychosocial Re-Evaluation    [place] Name 07/11/20 1035             Psychosocial Re-Evaluation   Current issues with None Identified        Comments  Ms. Krigbaum denies psychosocial barriers to participation in CR or self health management. She continues to work full time 2nd shift but makes her health and attending cardiac rehab a priority. She endorses a strong support system and enjoys reading and working in the yard. No interventions needed at this time.       Expected Outcomes Ms. Bittman will continue to maintain a positive attitude and outlook. She will continue to denie psychosocial barriers to participation in CR and self health management.       Interventions Encouraged to attend Cardiac Rehabilitation for the exercise       Continue Psychosocial Services  No Follow up required              Psychosocial Discharge (Final Psychosocial Re-Evaluation):  Psychosocial Re-Evaluation - 07/11/20 1035      Psychosocial Re-Evaluation   Current issues with None Identified    Comments Ms. Benedick denies psychosocial barriers to participation in [place] or self health management. She continues to work full time 2nd shift but makes her health and attending cardiac rehab a priority. She endorses a strong support system and enjoys reading and working in the yard. No interventions needed at this time.    Expected Outcomes Ms. Corkern will continue to maintain a positive attitude and outlook. She will continue to denie psychosocial barriers to participation in CR and self health management.    Interventions Encouraged to attend Cardiac Rehabilitation for the exercise    Continue Psychosocial Services  No Follow up required           Vocational Rehabilitation: Provide vocational rehab assistance to qualifying candidates.   Vocational Rehab Evaluation & Intervention:  Vocational Rehab - 06/23/20 0941      Initial Vocational Rehab Evaluation & Intervention   Assessment shows need for Vocational Rehabilitation No   Stamatia works full time and does not need vocational rehab at this time          Education: Education Goals: Education  classes will be provided on a weekly basis, covering required topics. Participant will state understanding/return demonstration of topics presented.  Learning Barriers/Preferences:  Learning Barriers/Preferences - 06/23/20 1112      Learning Barriers/Preferences   Learning Barriers None    Learning Preferences Video;Pictoral           Education Topics: Count Your Pulse:  -Group instruction provided by verbal instruction, demonstration, patient participation and written materials to support subject.  Instructors address importance of being able to find your pulse and how to count your pulse when at home without a heart monitor.  Patients get hands on experience counting their pulse with staff help and individually.   Heart Attack, Angina, and Risk Factor Modification:  -Group instruction provided by verbal instruction, video, and written materials to support subject.  Instructors address signs and symptoms of angina and heart attacks.    Also discuss risk factors for heart disease and how to make changes to improve heart health risk factors.   Functional Fitness:  -Group instruction provided by verbal instruction, demonstration, patient participation, and written materials to support subject.  Instructors address safety measures for doing things around the house.  Discuss how to get up and down off the floor, how to pick things up properly, how to safely get out of a chair without assistance, and balance training.   Meditation and Mindfulness:  -Group instruction provided by verbal instruction, patient participation, and written materials to support subject.  Instructor addresses importance of mindfulness and  meditation practice to help reduce stress and improve awareness.  Instructor also leads participants through a meditation exercise.    Stretching for Flexibility and Mobility:  -Group instruction provided by verbal instruction, patient participation, and written materials to  support subject.  Instructors lead participants through series of stretches that are designed to increase flexibility thus improving mobility.  These stretches are additional exercise for major muscle groups that are typically performed during regular warm up and cool down.   Hands Only CPR:  -Group verbal, video, and participation provides a basic overview of AHA guidelines for community CPR. Role-play of emergencies allow participants the opportunity to practice calling for help and chest compression technique with discussion of AED use.   Hypertension: -Group verbal and written instruction that provides a basic overview of hypertension including the most recent diagnostic guidelines, risk factor reduction with self-care instructions and medication management.    Nutrition I class: Heart Healthy Eating:  -Group instruction provided by PowerPoint slides, verbal discussion, and written materials to support subject matter. The instructor gives an explanation and review of the Therapeutic Lifestyle Changes diet recommendations, which includes a discussion on lipid goals, dietary fat, sodium, fiber, plant stanol/sterol esters, sugar, and the components of a well-balanced, healthy diet.   Nutrition II class: Lifestyle Skills:  -Group instruction provided by PowerPoint slides, verbal discussion, and written materials to support subject matter. The instructor gives an explanation and review of label reading, grocery shopping for heart health, heart healthy recipe modifications, and ways to make healthier choices when eating out.   Diabetes Question & Answer:  -Group instruction provided by PowerPoint slides, verbal discussion, and written materials to support subject matter. The instructor gives an explanation and review of diabetes co-morbidities, pre- and post-prandial blood glucose goals, pre-exercise blood glucose goals, signs, symptoms, and treatment of hypoglycemia and hyperglycemia, and foot  care basics.   Diabetes Blitz:  -Group instruction provided by PowerPoint slides, verbal discussion, and written materials to support subject matter. The instructor gives an explanation and review of the physiology behind type 1 and type 2 diabetes, diabetes medications and rational behind using different medications, pre- and post-prandial blood glucose recommendations and Hemoglobin A1c goals, diabetes diet, and exercise including blood glucose guidelines for exercising safely.    Portion Distortion:  -Group instruction provided by PowerPoint slides, verbal discussion, written materials, and food models to support subject matter. The instructor gives an explanation of serving size versus portion size, changes in portions sizes over the last 20 years, and what consists of a serving from each food group.   Stress Management:  -Group instruction provided by verbal instruction, video, and written materials to support subject matter.  Instructors review role of stress in heart disease and how to cope with stress positively.     Exercising on Your Own:  -Group instruction provided by verbal instruction, power point, and written materials to support subject.  Instructors discuss benefits of exercise, components of exercise, frequency and intensity of exercise, and end points for exercise.  Also discuss use of nitroglycerin and activating EMS.  Review options of places to exercise outside of rehab.  Review guidelines for sex with heart disease.   Cardiac Drugs I:  -Group instruction provided by verbal instruction and written materials to support subject.  Instructor reviews cardiac drug classes: antiplatelets, anticoagulants, beta blockers, and statins.  Instructor discusses reasons, side effects, and lifestyle considerations for each drug class.   Cardiac Drugs II:  -Group instruction provided by verbal instruction and written  materials to support subject.  Instructor reviews cardiac drug classes:  angiotensin converting enzyme inhibitors (ACE-I), angiotensin II receptor blockers (ARBs), nitrates, and calcium channel blockers.  Instructor discusses reasons, side effects, and lifestyle considerations for each drug class.   Anatomy and Physiology of the Circulatory System:  Group verbal and written instruction and models provide basic cardiac anatomy and physiology, with the coronary electrical and arterial systems. Review of: AMI, Angina, Valve disease, Heart Failure, Peripheral Artery Disease, Cardiac Arrhythmia, Pacemakers, and the ICD.   Other Education:  -Group or individual verbal, written, or video instructions that support the educational goals of the cardiac rehab program.   Holiday Eating Survival Tips:  -Group instruction provided by PowerPoint slides, verbal discussion, and written materials to support subject matter. The instructor gives patients tips, tricks, and techniques to help them not only survive but enjoy the holidays despite the onslaught of food that accompanies the holidays.   Knowledge Questionnaire Score:  Knowledge Questionnaire Score - 06/23/20 1111      Knowledge Questionnaire Score   Pre Score 18/24           Core Components/Risk Factors/Patient Goals at Admission:  Personal Goals and Risk Factors at Admission - 06/23/20 1353      Core Components/Risk Factors/Patient Goals on Admission    Weight Management Yes;Obesity;Weight Loss    Intervention Weight Management: Develop a combined nutrition and exercise program designed to reach desired caloric intake, while maintaining appropriate intake of nutrient and fiber, sodium and fats, and appropriate energy expenditure required for the weight goal.;Weight Management: Provide education and appropriate resources to help participant work on and attain dietary goals.;Weight Management/Obesity: Establish reasonable short term and long term weight goals.;Obesity: Provide education and appropriate resources to  help participant work on and attain dietary goals.    Admit Weight 1765 lb 14.2 oz (801 kg)    Expected Outcomes Short Term: Continue to assess and modify interventions until short term weight is achieved;Long Term: Adherence to nutrition and physical activity/exercise program aimed toward attainment of established weight goal;Understanding of distribution of calorie intake throughout the day with the consumption of 4-5 meals/snacks;Understanding recommendations for meals to include 15-35% energy as protein, 25-35% energy from fat, 35-60% energy from carbohydrates, less than 200mg  of dietary cholesterol, 20-35 gm of total fiber daily;Weight Loss: Understanding of general recommendations for a balanced deficit meal plan, which promotes 1-2 lb weight loss per week and includes a negative energy balance of [phone] kcal/d;Weight Maintenance: Understanding of the daily nutrition guidelines, which includes 25-35% calories from fat, 7% or less cal from saturated fats, less than 200mg  cholesterol, less than 1.5gm of sodium, & 5 or more servings of fruits and vegetables daily    Diabetes Yes    Intervention Provide education about signs/symptoms and action to take for hypo/hyperglycemia.;Provide education about proper nutrition, including hydration, and aerobic/resistive exercise prescription along with prescribed medications to achieve blood glucose in normal ranges: Fasting glucose 65-99 mg/dL    Expected Outcomes Long Term: Attainment of HbA1C < 7%.;Short Term: Participant verbalizes understanding of the signs/symptoms and immediate care of hyper/hypoglycemia, proper foot care and importance of medication, aerobic/resistive exercise and nutrition plan for blood glucose control.    Hypertension Yes    Intervention Provide education on lifestyle modifcations including regular physical activity/exercise, weight management, moderate sodium restriction and increased consumption of fresh fruit, vegetables, and low fat  dairy, alcohol moderation, and smoking cessation.;Monitor prescription use compliance.    Expected Outcomes Short Term: Continued assessment and intervention  until BP is < 140/75mm HG in hypertensive participants. < 130/63mm HG in hypertensive participants with diabetes, heart failure or chronic kidney disease.;Long Term: Maintenance of blood pressure at goal levels.    Lipids Yes    Intervention Provide education and support for participant on nutrition & aerobic/resistive exercise along with prescribed medications to achieve LDL 70mg , HDL >40mg .    Expected Outcomes Short Term: Participant states understanding of desired cholesterol values and is compliant with medications prescribed. Participant is following exercise prescription and nutrition guidelines.;Long Term: Cholesterol controlled with medications as prescribed, with individualized exercise RX and with personalized nutrition plan. Value goals: LDL < 70mg , HDL > 40 mg.    Stress Yes    Intervention Offer individual and/or small group education and counseling on adjustment to heart disease, stress management and health-related lifestyle change. Teach and support self-help strategies.;Refer participants experiencing significant psychosocial distress to appropriate mental health specialists for further evaluation and treatment. When possible, include family members and significant others in education/counseling sessions.    Expected Outcomes Short Term: Participant demonstrates changes in health-related behavior, relaxation and other stress management skills, ability to obtain effective social support, and compliance with psychotropic medications if prescribed.;Long Term: Emotional wellbeing is indicated by absence of clinically significant psychosocial distress or social isolation.           Core Components/Risk Factors/Patient Goals Review:   Goals and Risk Factor Review    Row Name 06/29/20 1329 07/11/20 1036           Core  Components/Risk Factors/Patient Goals Review   Personal Goals Review Weight Management/Obesity;Diabetes;Hypertension;Lipids;Stress Weight Management/Obesity;Diabetes;Hypertension;Lipids;Stress      Review Ms. Galdamez has multiple CAD risk factors. She is extremely eager to participate in CR for risk factor modification. Her goals are weight loss, "learn to eat better", and to gain stamina. Her weight loss goal is 10 lbs. Ms. Hieronymus has multiple CAD risk factors. She is extremely eager to participate in CR for risk factor modification. Her goals are weight loss, "learn to eat better", and to gain stamina. Her weight loss goal is 10 lbs.      Expected Outcomes Ms. Fix will continue to participate in CR for risk factor modification Ms. Gravett will continue to participate in CR for risk factor modification             Core Components/Risk Factors/Patient Goals at Discharge (Final Review):   Goals and Risk Factor Review - 07/11/20 1036      Core Components/Risk Factors/Patient Goals Review   Personal Goals Review Weight Management/Obesity;Diabetes;Hypertension;Lipids;Stress    Review Ms. Falwell has multiple CAD risk factors. She is extremely eager to participate in CR for risk factor modification. Her goals are weight loss, "learn to eat better", and to gain stamina. Her weight loss goal is 10 lbs.    Expected Outcomes Ms. Rama will continue to participate in CR for risk factor modification           ITP Comments:  ITP Comments    Row Name 06/23/20 2876 06/29/20 1310 07/11/20 1028       ITP Comments Dr Fransico Him MD, Medical Director Ms. Diez completed her first cardiac rehab exercise session today and tolerated very well. Worked to an RPE of 11. VSS. Denied complaints. 30 day ITP review: Ms. Dhami has completed 5 cardiac rehab exercise sessions since admission. She was absent today. At her last exercise session she worked to an RPE of 11 with average mets 2.0-2.39 while walking the  track. She is eager to continue to participate in CR for lifestyle modifications and risk factor management.            Comments: see ITP comments

## 2020-07-13 ENCOUNTER — Encounter (HOSPITAL_COMMUNITY)
Admission: RE | Admit: 2020-07-13 | Discharge: 2020-07-13 | Disposition: A | Discharge status: Home / Self Care | Payer: 59 | Source: Ambulatory Visit | Attending: Cardiovascular Disease | Admitting: Cardiovascular Disease

## 2020-07-13 ENCOUNTER — Other Ambulatory Visit: Payer: Self-pay

## 2020-07-13 DIAGNOSIS — Z955 Presence of coronary angioplasty implant and graft: Secondary | ICD-10-CM | POA: Diagnosis not present

## 2020-07-15 ENCOUNTER — Encounter (HOSPITAL_COMMUNITY)
Admission: RE | Admit: 2020-07-15 | Discharge: 2020-07-15 | Disposition: A | Discharge status: Home / Self Care | Payer: 59 | Source: Ambulatory Visit | Attending: Cardiovascular Disease | Admitting: Cardiovascular Disease

## 2020-07-15 ENCOUNTER — Other Ambulatory Visit: Payer: Self-pay

## 2020-07-15 DIAGNOSIS — Z955 Presence of coronary angioplasty implant and graft: Secondary | ICD-10-CM

## 2020-07-18 ENCOUNTER — Other Ambulatory Visit: Payer: Self-pay

## 2020-07-18 ENCOUNTER — Encounter (HOSPITAL_COMMUNITY)
Admission: RE | Admit: 2020-07-18 | Discharge: 2020-07-18 | Disposition: A | Discharge status: Home / Self Care | Payer: 59 | Source: Ambulatory Visit | Attending: Cardiovascular Disease | Admitting: Cardiovascular Disease

## 2020-07-18 DIAGNOSIS — Z955 Presence of coronary angioplasty implant and graft: Secondary | ICD-10-CM | POA: Diagnosis not present

## 2020-07-20 ENCOUNTER — Encounter (HOSPITAL_COMMUNITY)
Admission: RE | Admit: 2020-07-20 | Discharge: 2020-07-20 | Disposition: A | Discharge status: Home / Self Care | Payer: 59 | Source: Ambulatory Visit | Attending: Cardiovascular Disease | Admitting: Cardiovascular Disease

## 2020-07-20 ENCOUNTER — Other Ambulatory Visit: Payer: Self-pay

## 2020-07-20 VITALS — BP 100/50 | HR 91 | Wt 177.7 lb

## 2020-07-20 DIAGNOSIS — Z955 Presence of coronary angioplasty implant and graft: Secondary | ICD-10-CM | POA: Diagnosis not present

## 2020-07-20 NOTE — Progress Notes (Signed)
Reviewed home exercise guidelines with patient including endpoints, temperature precautions, target heart rate and rate of perceived exertion. Pt plans to walk as her mode of home exercise. Encouraged patient to add one day of walking 30 minutes in addition to exercise at cardiac rehab, once that becomes a routine add a second day of walking. Pt is amenable to this plan. Pt voices understanding of instructions given.  Sol Passer, MS, ACSM CEP

## 2020-07-22 ENCOUNTER — Encounter (HOSPITAL_COMMUNITY): Payer: 59

## 2020-07-25 ENCOUNTER — Encounter (HOSPITAL_COMMUNITY): Payer: 59

## 2020-07-27 ENCOUNTER — Encounter (HOSPITAL_COMMUNITY): Payer: 59

## 2020-07-28 ENCOUNTER — Telehealth (HOSPITAL_COMMUNITY): Payer: Self-pay | Admitting: Nurse Practitioner

## 2020-07-28 ENCOUNTER — Other Ambulatory Visit: Payer: Self-pay | Admitting: General Practice

## 2020-07-28 ENCOUNTER — Encounter (HOSPITAL_COMMUNITY): Payer: Self-pay | Admitting: Nurse Practitioner

## 2020-07-29 ENCOUNTER — Encounter (HOSPITAL_COMMUNITY): Payer: Self-pay

## 2020-07-29 ENCOUNTER — Telehealth (HOSPITAL_COMMUNITY): Payer: Self-pay

## 2020-07-29 ENCOUNTER — Encounter (HOSPITAL_COMMUNITY): Payer: 59

## 2020-07-29 DIAGNOSIS — Z955 Presence of coronary angioplasty implant and graft: Secondary | ICD-10-CM

## 2020-07-29 NOTE — Progress Notes (Signed)
Discharge Progress Report  Patient Details  Name: Mary Holt MRN: [id number] Date of Birth: [birth year] Referring Provider:     CARDIAC REHAB PHASE II ORIENTATION from 06/23/2020 in [place]  Referring Provider Skeet Latch, MD       Number of Visits: 9  Reason for Discharge:  Early Exit:  Personal. Patient states "have too much going on". Mary Holt works 2nd shift and does not get home until 1:00am. She has stated multiple times how hard it is for her to get up for her 7:00 am cardiac rehab exercise session and then return home for additional sleep prior to work. She put forth great effort during each of her 9 exercise sessions and worked to an RPE of 11. She tolerated increased workloads. VSS. Denied cardiac complaints. She has been provided home exercise instructions by EP prior to requesting discharge from program.  Smoking History:  Social History   Tobacco Use  Smoking Status Never Smoker  Smokeless Tobacco Never Used    Diagnosis:  S/P DES OM2, 2nd Diagonal 03/07/20  ADL [facility]SD:   Initial Exercise Prescription:  Initial Exercise Prescription - 06/23/20 1000      Date of Initial Exercise RX and Referring Provider   Date 06/23/20    Referring Provider Skeet Latch, MD    Expected Discharge Date 08/19/20      Treadmill   MPH 2.7    Grade 0    Minutes 15    METs 3.07      NuStep   Level 2    SPM 80    Minutes 15    METs 2      Prescription Details   Frequency (times per week) 3    Duration Progress to 30 minutes of continuous aerobic without signs/symptoms of physical distress      Intensity   THRR 40-80% of Max Heartrate 64-129    Ratings of Perceived Exertion 11-13    Perceived Dyspnea 0-4      Progression   Progression Continue progressive overload as per policy without signs/symptoms or physical distress.      Resistance Training   Training Prescription Yes    Weight 3    Reps 10-15            Discharge Exercise Prescription (Final Exercise Prescription Changes):  Exercise Prescription Changes - 07/20/20 0709      Response to Exercise   Blood Pressure (Admit) 100/50    Blood Pressure (Exercise) 118/80    Blood Pressure (Exit) 104/62    Heart Rate (Admit) 91 bpm    Heart Rate (Exercise) 109 bpm    Heart Rate (Exit) 84 bpm    Rating of Perceived Exertion (Exercise) 11    Duration Progress to 30 minutes of  aerobic without signs/symptoms of physical distress    Intensity THRR unchanged      Progression   Progression Continue to progress workloads to maintain intensity without signs/symptoms of physical distress.    Average METs 2.7      Resistance Training   Training Prescription No    Weight --    Reps --    Time --      Interval Training   Interval Training No      NuStep   Level 3    SPM 80    Minutes 15    METs 2.5      Track   Laps 16    Minutes 15  METs 2.86      Home Exercise Plan   Plans to continue exercise at Home (comment)   Walking   Frequency Add 1 additional day to program exercise sessions.    Initial Home Exercises Provided 07/20/20           Functional Capacity:  6 Minute Walk    Row Name 06/23/20 1023         6 Minute Walk   Phase Initial     Distance 1407 feet     Walk Time 6 minutes     # of Rest Breaks 0     MPH 2.66     METS 3.49     RPE 11     Perceived Dyspnea  0     VO2 Peak 12.21     Symptoms No     Resting HR 72 bpm     Resting BP 120/62     Resting Oxygen Saturation  99 %     Exercise Oxygen Saturation  during 6 min walk 98 %     Max Ex. HR 102 bpm     Max Ex. BP 136/80     2 Minute Post BP 120/80            Psychological, QOL, Others - Outcomes: PHQ 2/9: Depression screen [facility] 2/9 06/23/2020 02/04/2020 12/07/2019 02/02/2019  Decreased Interest 0 0 0 0  Down, Depressed, Hopeless 0 0 0 0  PHQ - 2 Score 0 0 0 0    Quality of Life:  Quality of Life - 06/23/20 1114      Quality of Life   Select  Quality of Life      Quality of Life Scores   Health/Function Pre 21.86 %    Socioeconomic Pre 21.14 %    Psych/Spiritual Pre 21.5 %    Family Pre 22.5 %    GLOBAL Pre 21.7 %           Personal Goals: Goals established at orientation with interventions provided to work toward goal.  Personal Goals and Risk Factors at Admission - 06/23/20 1353      Core Components/Risk Factors/Patient Goals on Admission    Weight Management Yes;Obesity;Weight Loss    Intervention Weight Management: Develop a combined nutrition and exercise program designed to reach desired caloric intake, while maintaining appropriate intake of nutrient and fiber, sodium and fats, and appropriate energy expenditure required for the weight goal.;Weight Management: Provide education and appropriate resources to help participant work on and attain dietary goals.;Weight Management/Obesity: Establish reasonable short term and long term weight goals.;Obesity: Provide education and appropriate resources to help participant work on and attain dietary goals.    Admit Weight 1765 lb 14.2 oz (801 kg)    Expected Outcomes Short Term: Continue to assess and modify interventions until short term weight is achieved;Long Term: Adherence to nutrition and physical activity/exercise program aimed toward attainment of established weight goal;Understanding of distribution of calorie intake throughout the day with the consumption of 4-5 meals/snacks;Understanding recommendations for meals to include 15-35% energy as protein, 25-35% energy from fat, 35-60% energy from carbohydrates, less than 200mg  of dietary cholesterol, 20-35 gm of total fiber daily;Weight Loss: Understanding of general recommendations for a balanced deficit meal plan, which promotes 1-2 lb weight loss per week and includes a negative energy balance of [phone] kcal/d;Weight Maintenance: Understanding of the daily nutrition guidelines, which includes 25-35% calories from fat, 7% or  less cal from saturated fats, less than 200mg  cholesterol, less  than 1.5gm of sodium, & 5 or more servings of fruits and vegetables daily    Diabetes Yes    Intervention Provide education about signs/symptoms and action to take for hypo/hyperglycemia.;Provide education about proper nutrition, including hydration, and aerobic/resistive exercise prescription along with prescribed medications to achieve blood glucose in normal ranges: Fasting glucose 65-99 mg/dL    Expected Outcomes Long Term: Attainment of HbA1C < 7%.;Short Term: Participant verbalizes understanding of the signs/symptoms and immediate care of hyper/hypoglycemia, proper foot care and importance of medication, aerobic/resistive exercise and nutrition plan for blood glucose control.    Hypertension Yes    Intervention Provide education on lifestyle modifcations including regular physical activity/exercise, weight management, moderate sodium restriction and increased consumption of fresh fruit, vegetables, and low fat dairy, alcohol moderation, and smoking cessation.;Monitor prescription use compliance.    Expected Outcomes Short Term: Continued assessment and intervention until BP is < 140/42mm HG in hypertensive participants. < 130/13mm HG in hypertensive participants with diabetes, heart failure or chronic kidney disease.;Long Term: Maintenance of blood pressure at goal levels.    Lipids Yes    Intervention Provide education and support for participant on nutrition & aerobic/resistive exercise along with prescribed medications to achieve LDL 70mg , HDL >40mg .    Expected Outcomes Short Term: Participant states understanding of desired cholesterol values and is compliant with medications prescribed. Participant is following exercise prescription and nutrition guidelines.;Long Term: Cholesterol controlled with medications as prescribed, with individualized exercise RX and with personalized nutrition plan. Value goals: LDL < 70mg , HDL > 40 mg.      Stress Yes    Intervention Offer individual and/or small group education and counseling on adjustment to heart disease, stress management and health-related lifestyle change. Teach and support self-help strategies.;Refer participants experiencing significant psychosocial distress to appropriate mental health specialists for further evaluation and treatment. When possible, include family members and significant others in education/counseling sessions.    Expected Outcomes Short Term: Participant demonstrates changes in health-related behavior, relaxation and other stress management skills, ability to obtain effective social support, and compliance with psychotropic medications if prescribed.;Long Term: Emotional wellbeing is indicated by absence of clinically significant psychosocial distress or social isolation.            Personal Goals Discharge:  Goals and Risk Factor Review    Row Name 06/29/20 1329 07/11/20 1036 07/29/20 1621         Core Components/Risk Factors/Patient Goals Review   Personal Goals Review Weight Management/Obesity;Diabetes;Hypertension;Lipids;Stress Weight Management/Obesity;Diabetes;Hypertension;Lipids;Stress Weight Management/Obesity;Diabetes;Hypertension;Lipids;Stress     Review Mary Holt has multiple CAD risk factors. She is extremely eager to participate in CR for risk factor modification. Her goals are weight loss, "learn to eat better", and to gain stamina. Her weight loss goal is 10 lbs. Mary Holt has multiple CAD risk factors. She is extremely eager to participate in CR for risk factor modification. Her goals are weight loss, "learn to eat better", and to gain stamina. Her weight loss goal is 10 lbs. --     Expected Outcomes Mary Holt will continue to participate in CR for risk factor modification Mary Holt will continue to participate in CR for risk factor modification Mary Holt continues to have CAD risk factors but feels she has the tools needed to modify  and prevent worsening CAD            Exercise Goals and Review:  Exercise Goals    Row Name 06/23/20 1035  Exercise Goals   Increase Physical Activity Yes       Intervention Provide advice, education, support and counseling about physical activity/exercise needs.;Develop an individualized exercise prescription for aerobic and resistive training based on initial evaluation findings, risk stratification, comorbidities and participant's personal goals.       Expected Outcomes Short Term: Attend rehab on a regular basis to increase amount of physical activity.;Long Term: Add in home exercise to make exercise part of routine and to increase amount of physical activity.;Long Term: Exercising regularly at least 3-5 days a week.       Increase Strength and Stamina Yes       Intervention Provide advice, education, support and counseling about physical activity/exercise needs.;Develop an individualized exercise prescription for aerobic and resistive training based on initial evaluation findings, risk stratification, comorbidities and participant's personal goals.       Expected Outcomes Short Term: Increase workloads from initial exercise prescription for resistance, speed, and METs.;Short Term: Perform resistance training exercises routinely during rehab and add in resistance training at home;Long Term: Improve cardiorespiratory fitness, muscular endurance and strength as measured by increased METs and functional capacity (6MWT)       Able to understand and use rate of perceived exertion (RPE) scale Yes       Intervention Provide education and explanation on how to use RPE scale       Expected Outcomes Short Term: Able to use RPE daily in rehab to express subjective intensity level;Long Term:  Able to use RPE to guide intensity level when exercising independently       Knowledge and understanding of Target Heart Rate Range (THRR) Yes       Intervention Provide education and explanation of  THRR including how the numbers were predicted and where they are located for reference       Expected Outcomes Short Term: Able to state/look up THRR;Long Term: Able to use THRR to govern intensity when exercising independently;Short Term: Able to use daily as guideline for intensity in rehab       Able to check pulse independently Yes       Intervention Provide education and demonstration on how to check pulse in carotid and radial arteries.;Review the importance of being able to check your own pulse for safety during independent exercise       Expected Outcomes Short Term: Able to explain why pulse checking is important during independent exercise;Long Term: Able to check pulse independently and accurately       Understanding of Exercise Prescription Yes       Intervention Provide education, explanation, and written materials on patient's individual exercise prescription       Expected Outcomes Short Term: Able to explain program exercise prescription;Long Term: Able to explain home exercise prescription to exercise independently              Exercise Goals Re-Evaluation:  Exercise Goals Re-Evaluation    Row Name 07/06/20 0745 07/20/20 0714           Exercise Goal Re-Evaluation   Exercise Goals Review Increase Physical Activity;Able to understand and use rate of perceived exertion (RPE) scale Increase Physical Activity;Able to understand and use rate of perceived exertion (RPE) scale;Understanding of Exercise Prescription;Knowledge and understanding of Target Heart Rate Range (THRR);Increase Strength and Stamina      Comments Patient able to understand and use RPE scale appropriately. Patient is not exericsing at home at this time because of work schedule. Patient's goal is to start and  continue walking at home. Reviewed home exercise guidelines with patient including endpoints, temperature precautions, target heart rate and rate of perceived exertion. Pt plans to walk as her mode of home  exercise. Encouraged patient to add one day of walking 30 minutes in addition to exercise at cardiac rehab, once that becomes a routine add a second day of walking. Pt is amenable to this plan. Pt voices understanding of instructions given.      Expected Outcomes Patient will add 1-2 days/week walking at home in addition to exericse at cardiac rehab. Patient will add 1 day of walking 30 minutes at home in addition to exericse at cardiac rehab.             Nutrition & Weight - Outcomes:  Pre Biometrics - 06/23/20 0900      Pre Biometrics   Waist Circumference 40 inches    Hip Circumference 44.25 inches    Waist to Hip Ratio 0.9 %    Triceps Skinfold 28 mm    % Body Fat 31.7 %    Grip Strength 27 kg    Flexibility 13 in    Single Leg Stand 39.21 seconds            Nutrition:  Nutrition Therapy & Goals - 07/05/20 0835      Nutrition Therapy   Diet Heart Healthy/Carb modified      Personal Nutrition Goals   Nutrition Goal Improved blood glucose control as evidenced by pt's A1c trending from 7.2 toward less than 7.0.    Personal Goal #2 Pt to build a healthy plate including vegetables, fruits, whole grains, and low-fat dairy products in a heart healthy meal plan.    Personal Goal #3 Pt able to name foods that affect blood glucose      Intervention Plan   Intervention Prescribe, educate and counsel regarding individualized specific dietary modifications aiming towards targeted core components such as weight, hypertension, lipid management, diabetes, heart failure and other comorbidities.;Nutrition handout(s) given to patient.    Expected Outcomes Short Term Goal: A plan has been developed with personal nutrition goals set during dietitian appointment.;Long Term Goal: Adherence to prescribed nutrition plan.           Nutrition Discharge:   Education Questionnaire Score:  Knowledge Questionnaire Score - 06/23/20 1111      Knowledge Questionnaire Score   Pre Score 18/24

## 2020-07-29 NOTE — Telephone Encounter (Signed)
Cardiac Rehab Note:  Unsuccessful telephone encounter to [place]. [facility] to follow up on cardiac rehab program withdrawal. [employer] compliant VM message left requesting call back to [phone].  Viren Lebeau E. Rollene Rotunda RN, BSN [place]. [facility]  Cardiac and Pulmonary Rehabilitation Phone: [phone] Fax: [phone]

## 2020-08-01 ENCOUNTER — Encounter (HOSPITAL_COMMUNITY): Payer: 59

## 2020-08-03 ENCOUNTER — Encounter (HOSPITAL_COMMUNITY): Payer: 59

## 2020-08-05 ENCOUNTER — Encounter (HOSPITAL_COMMUNITY): Payer: 59

## 2020-08-08 ENCOUNTER — Encounter (HOSPITAL_COMMUNITY): Payer: 59

## 2020-08-10 ENCOUNTER — Encounter (HOSPITAL_COMMUNITY): Payer: 59

## 2020-08-10 ENCOUNTER — Ambulatory Visit: Payer: 59 | Admitting: Nurse Practitioner

## 2020-08-12 ENCOUNTER — Encounter (HOSPITAL_COMMUNITY): Payer: 59

## 2020-08-15 ENCOUNTER — Encounter (HOSPITAL_COMMUNITY): Payer: 59

## 2020-08-17 ENCOUNTER — Encounter (HOSPITAL_COMMUNITY): Payer: 59

## 2020-08-19 ENCOUNTER — Encounter (HOSPITAL_COMMUNITY): Payer: 59

## 2020-09-02 ENCOUNTER — Telehealth: Payer: Self-pay

## 2020-09-02 DIAGNOSIS — Z8679 Personal history of other diseases of the circulatory system: Secondary | ICD-10-CM

## 2020-09-02 DIAGNOSIS — E7800 Pure hypercholesterolemia, unspecified: Secondary | ICD-10-CM

## 2020-09-02 DIAGNOSIS — I25119 Atherosclerotic heart disease of native coronary artery with unspecified angina pectoris: Secondary | ICD-10-CM

## 2020-09-02 MED ORDER — BEMPEDOIC ACID 180 MG PO TABS: 1.0000 | ORAL_TABLET | Freq: Every day | ORAL | 1 refills | Status: DC

## 2020-09-02 NOTE — Telephone Encounter (Signed)
Called and spoke w/pt stated that they haven't been taking the nexlitol because the pharmacy never called them to tell them that it was ready. So I sent a refill in [facility]. Also ordered lft, lipid, uric acit, and cmet for the pt to complete once she has been on the med for about a month. Pt voiced understanding

## 2020-09-02 NOTE — Telephone Encounter (Signed)
-----   Message from Harrington Challenger, RPH-CPP sent at 09/02/2020  7:59 AM EDT ----- Please call patient. Due to repeat fasting lipid panel if taking nexletol plus rosuvastatin. Also order CMET and uric acid level  Thanks ----- Message ----- From: Harrington Challenger, RPH-CPP Sent: 09/02/2020 To: Raquel Rodriguez-Guzman, RPH-CPP  Repeat fasting lipid panel (on rosuvastatin and Nexletol) - 3 months

## 2020-09-19 ENCOUNTER — Ambulatory Visit (INDEPENDENT_AMBULATORY_CARE_PROVIDER_SITE_OTHER): Payer: 59 | Admitting: Nurse Practitioner

## 2020-09-19 ENCOUNTER — Encounter: Payer: Self-pay | Admitting: Nurse Practitioner

## 2020-09-19 ENCOUNTER — Other Ambulatory Visit: Payer: Self-pay

## 2020-09-19 VITALS — BP 132/84 | HR 71 | Temp 98.1°F | Ht 63.25 in | Wt 171.6 lb

## 2020-09-19 DIAGNOSIS — E119 Type 2 diabetes mellitus without complications: Secondary | ICD-10-CM | POA: Diagnosis not present

## 2020-09-19 DIAGNOSIS — I25119 Atherosclerotic heart disease of native coronary artery with unspecified angina pectoris: Secondary | ICD-10-CM | POA: Diagnosis not present

## 2020-09-19 DIAGNOSIS — Z6830 Body mass index (BMI) 30.0-30.9, adult: Secondary | ICD-10-CM | POA: Diagnosis not present

## 2020-09-19 DIAGNOSIS — Z23 Encounter for immunization: Secondary | ICD-10-CM

## 2020-09-19 DIAGNOSIS — I10 Essential (primary) hypertension: Secondary | ICD-10-CM | POA: Diagnosis not present

## 2020-09-19 LAB — HEMOGLOBIN A1C
Est. average glucose Bld gHb Est-mCnc: 169 mg/dL
Hgb A1c MFr Bld: 7.5 % — ABNORMAL HIGH (ref 4.8–5.6)

## 2020-09-19 NOTE — Progress Notes (Signed)
I,Yamilka Roman [employer] as a Education administrator for [employer], FNP.,have documented all relevant documentation on the behalf of [name], FNP,as directed by  [name], FNP while in the presence of [name], [place]. This visit occurred during the SARS-CoV-2 public health emergency.  Safety protocols were in place, including screening questions prior to the visit, additional usage of staff PPE, and extensive cleaning of exam room while observing appropriate contact time as indicated for disinfecting solutions.  Subjective:     Patient ID: Mary Holt , female    DOB: 03/06/1961 , 59 y.o.   MRN: [id number]   Chief Complaint  Patient presents with  . Diabetes  . Hypertension    HPI  Here for 4 month hypertension and abnormal glucose follow up.    Wt Readings from Last 3 Encounters: 09/19/20 : 171 lb 9.6 oz (77.8 kg) 07/20/20 : 177 lb 11.1 oz (80.6 kg) 06/23/20 : 176 lb 9.4 oz (80.1 kg)  She went to [employer] for 4 weeks -she was unable to continue due to the time.      Hypertension This is a chronic problem. The current episode started more than 1 year ago. The problem has been gradually improving since onset. The problem is controlled. Pertinent negatives include no anxiety, chest pain, headaches or palpitations. There are no associated agents to hypertension. Risk factors for coronary artery disease include dyslipidemia, sedentary lifestyle and obesity. Past treatments include angiotensin blockers. There are no compliance problems.  There is no history of angina. There is no history of chronic renal disease.     Past Medical History:  Diagnosis Date  . Angina pectoris ([place]) 02/22/2020  . CAD in native artery 02/22/2020  . Chest pain of uncertain etiology 1/32/4401  . Diabetes mellitus without complication ([place])   . Heart murmur    asymptomatic  . Hyperlipidemia   . Hypertension   . Kidney stones   . Kidney stones    several times, last  one 2014  . Mitral valve  prolapse   . MVP (mitral valve prolapse)   . Pure hypercholesterolemia 05/02/2020  . Snoring 01/06/2020     Family History  Problem Relation Age of Onset  . Stroke Mother   . Heart attack Father   . Breast cancer Sister   . Heart attack Brother   . Ovarian cancer Sister   . Lymphoma Sister   . Heart attack Brother   . Colon cancer Neg Hx   . Colon polyps Neg Hx   . Esophageal cancer Neg Hx   . Rectal cancer Neg Hx   . Stomach cancer Neg Hx      Current Outpatient Medications:  .  aspirin EC 81 MG tablet, Take 1 tablet (81 mg total) by mouth daily., Disp: 90 tablet, Rfl: 3 .  Azilsartan-Chlorthalidone (EDARBYCLOR) 40-12.5 MG TABS, Take 1 tablet by mouth daily., Disp: 90 tablet, Rfl: 1 .  Bempedoic Acid 180 MG TABS, Take 1 tablet by mouth daily., Disp: 90 tablet, Rfl: 1 .  carvedilol (COREG) 3.125 MG tablet, TAKE 1 TABLET(3.125 MG) BY MOUTH TWICE DAILY, Disp: 60 tablet, Rfl: 3 .  clopidogrel (PLAVIX) 75 MG tablet, Take 1 tablet (75 mg total) by mouth daily with breakfast., Disp: 90 tablet, Rfl: 1 .  metFORMIN (GLUCOPHAGE) 500 MG tablet, Take 1 tablet (500 mg total) by mouth 2 (two) times daily with a meal. Resume on 02/29/20., Disp: 180 tablet, Rfl: 1 .  rosuvastatin (CRESTOR) 40 MG tablet, Take 1 tablet (  40 mg total) by mouth daily at 6 PM., Disp: 90 tablet, Rfl: 1 .  clindamycin (CLEOCIN) 300 MG capsule, Take 2 tabs by mouth 30-60 minutes before dental procedure (Patient not taking: Reported on 05/05/2020), Disp: 10 capsule, Rfl: 0 .  mometasone (ELOCON) 0.1 % cream, Apply to affected area daily (Patient not taking: Reported on 09/19/2020), Disp: 45 g, Rfl: 2 .  sitaGLIPtin (JANUVIA) 25 MG tablet, Take 1 tablet (25 mg total) by mouth daily., Disp: 90 tablet, Rfl: 0   Allergies  Allergen Reactions  . Nabumetone Rash  . Penicillins Rash    Did it involve swelling of the face/tongue/throat, SOB, or low BP? Yes Did it involve sudden or severe rash/hives, skin peeling, or any reaction on  the inside of your mouth or nose? No Did you need to seek medical attention at a hospital or doctor's office? No When did it last happen? >10 years ago If all above answers are "NO", may proceed with cephalosporin use.      Review of Systems  Constitutional: Negative.  Negative for fatigue.  Respiratory: Negative.  Negative for cough.   Cardiovascular: Negative for chest pain, palpitations and leg swelling.  Neurological: Negative for dizziness and headaches.  Psychiatric/Behavioral: Negative.      Today's Vitals   09/19/20 1052  BP: 132/84  Pulse: 71  Temp: 98.1 F (36.7 C)  Weight: 171 lb 9.6 oz (77.8 kg)  Height: 5' 3.25" (1.607 m)  PainSc: 0-No pain   Body mass index is 30.16 kg/m.   Objective:  Physical Exam Vitals reviewed.  Constitutional:      General: She is not in acute distress.    Appearance: Normal appearance. She is well-developed.  HENT:     Right Ear: Hearing normal.     Left Ear: Hearing normal.  Eyes:     General: Lids are normal.     Funduscopic exam:    Right eye: No papilledema.        Left eye: No papilledema.  Neck:     Thyroid: No thyroid mass.     Vascular: No carotid bruit.  Cardiovascular:     Rate and Rhythm: Normal rate and regular rhythm.     Pulses: Normal pulses.     Heart sounds: Normal heart sounds. No murmur heard.   Pulmonary:     Effort: Pulmonary effort is normal. No respiratory distress.     Breath sounds: Normal breath sounds. No wheezing.  Chest:     Breasts:        Right: Normal. No mass or tenderness.        Left: Normal. No mass or tenderness.  Musculoskeletal:        General: No swelling. Normal range of motion.     Cervical back: Full passive range of motion without pain, normal range of motion and neck supple.     Right lower leg: No edema.     Left lower leg: No edema.  Lymphadenopathy:     Upper Body:     Right upper body: No supraclavicular adenopathy.     Left upper body: No supraclavicular adenopathy.   Skin:    General: Skin is warm and dry.     Capillary Refill: Capillary refill takes less than 2 seconds.  Neurological:     General: No focal deficit present.     Mental Status: She is alert and oriented to person, place, and time.     Cranial Nerves: No cranial nerve deficit.  Sensory: No sensory deficit.     Motor: No weakness.  Psychiatric:        Mood and Affect: Mood normal.        Behavior: Behavior normal.        Thought Content: Thought content normal.        Judgment: Judgment normal.         Assessment And Plan:     1. Essential hypertension  Chronic, fairly controlled.  Continue follow up with Cardiology  2. Type 2 diabetes mellitus without complication, without long-term current use of insulin (HCC)  Chronic, elevated HgbA1c at her last visit  She is trying to lose weight and eat better but is under increased stress with her son and his family living with her  Continue with current medications pending labs of HgbA1c may need to adjust medications  Encouraged to continue to limit intake of sugary foods and drinks  Encouraged to continue to increase physical activity to 150 minutes per week - Hemoglobin A1c  3. Coronary artery disease involving native coronary artery of native heart with angina pectoris ([place])  Continue with follow up with Cardiology  4. BMI 30.0-30.9,adult  She is encouraged to strive for BMI less than 30 to decrease cardiac risk. Advised to aim for at least 150 minutes of exercise per week.  5. Need for influenza vaccination  Influenza vaccine administered  Encouraged to take Tylenol as needed for fever or muscle aches. - Flu Vaccine QUAD 6+ mos PF IM (Fluarix Quad PF)     Patient was given opportunity to ask questions. Patient verbalized understanding of the plan and was able to repeat key elements of the plan. All questions were answered to their satisfaction.   Teola Bradley, FNP, have reviewed all documentation for this  visit. The documentation on 10/02/20 for the exam, diagnosis, procedures, and orders are all accurate and complete.  THE PATIENT IS ENCOURAGED TO PRACTICE SOCIAL DISTANCING DUE TO THE COVID-19 PANDEMIC.

## 2020-09-27 ENCOUNTER — Telehealth: Payer: Self-pay | Admitting: Cardiovascular Disease

## 2020-09-27 NOTE — Telephone Encounter (Signed)
lvm for patient to return call to get follow up scheduled with [place] from recall list  

## 2020-09-28 ENCOUNTER — Other Ambulatory Visit: Payer: Self-pay | Admitting: Nurse Practitioner

## 2020-09-28 MED ORDER — SITAGLIPTIN PHOSPHATE 25 MG PO TABS
25.0000 mg | ORAL_TABLET | Freq: Every day | ORAL | 0 refills | Status: DC
Start: 2020-09-28 — End: 2020-11-21

## 2020-09-29 ENCOUNTER — Other Ambulatory Visit: Payer: Self-pay

## 2020-09-29 DIAGNOSIS — I25119 Atherosclerotic heart disease of native coronary artery with unspecified angina pectoris: Secondary | ICD-10-CM

## 2020-09-29 DIAGNOSIS — E7800 Pure hypercholesterolemia, unspecified: Secondary | ICD-10-CM

## 2020-09-29 DIAGNOSIS — Z8679 Personal history of other diseases of the circulatory system: Secondary | ICD-10-CM

## 2020-10-01 LAB — HEPATIC FUNCTION PANEL: Bilirubin, Direct: <0.1 mg/dL (ref 0.00–0.40)

## 2020-10-01 LAB — COMPREHENSIVE METABOLIC PANEL
ALT: 35 IU/L — ABNORMAL HIGH (ref 0–32)
AST: 35 IU/L (ref 0–40)
Albumin/Globulin Ratio: 1.7 (ref 1.2–2.2)
Albumin: 4.6 g/dL (ref 3.8–4.9)
Alkaline Phosphatase: 64 IU/L (ref 44–121)
BUN/Creatinine Ratio: 23 (ref 9–23)
BUN: 21 mg/dL (ref 6–24)
Bilirubin Total: <0.2 mg/dL (ref 0.0–1.2)
CO2: 24 mmol/L (ref 20–29)
Calcium: 10 mg/dL (ref 8.7–10.2)
Chloride: 101 mmol/L (ref 96–106)
Creatinine, Ser: 0.92 mg/dL (ref 0.57–1.00)
GFR calc Af Amer: 79 mL/min/{1.73_m2} (ref >59)
GFR calc non Af Amer: 68 mL/min/{1.73_m2} (ref >59)
Globulin, Total: 2.7 g/dL (ref 1.5–4.5)
Glucose: 137 mg/dL — ABNORMAL HIGH (ref 65–99)
Potassium: 4.5 mmol/L (ref 3.5–5.2)
Sodium: 138 mmol/L (ref 134–144)
Total Protein: 7.3 g/dL (ref 6.0–8.5)

## 2020-10-01 LAB — LIPID PANEL
Chol/HDL Ratio: 2.9 ratio (ref 0.0–4.4)
Cholesterol, Total: 126 mg/dL (ref 100–199)
HDL: 43 mg/dL (ref >39)
LDL Chol Calc (NIH): 68 mg/dL (ref 0–99)
Triglycerides: 76 mg/dL (ref 0–149)
VLDL Cholesterol Cal: 15 mg/dL (ref 5–40)

## 2020-10-01 LAB — URIC ACID: Uric Acid: 6.8 mg/dL (ref 3.0–7.2)

## 2020-10-27 ENCOUNTER — Other Ambulatory Visit: Payer: Self-pay | Admitting: Nurse Practitioner

## 2020-10-27 DIAGNOSIS — I10 Essential (primary) hypertension: Secondary | ICD-10-CM

## 2020-11-01 ENCOUNTER — Telehealth: Payer: Self-pay | Admitting: Cardiovascular Disease

## 2020-11-01 NOTE — Telephone Encounter (Signed)
Pt states she is feeling some chest discomfort in her left breast area. She started noticing it early this morning while she was still resting in bed. Getting up, moving around (making her bed, etc.) has not exacerbated the discomfort. Discomfort is not relieved by anything specific. Pt denies feeling lightheaded, dizzy, short of breath, arm or jaw pain or nausea.   Pt states this discomfort feels different from the pain she had prior to getting stents. Describes it as a dull pain. Not sharp, not burning, not tightness.   Pt took SL Nitro x1 while on the phone, but the discomfort was unchanged after 5 minutes.   After conferring with Dr. Oval Linsey, advised pt that she can defer evaluation until tomorrow morning's appointment if she feels comfortable doing so and in the absence of any new or worsening symptoms. Advised pt to seek emergent evaluation for worsening or new symptoms such as evolving or more intense pain or onset of shortness of breath. Pt verbalizes understanding and agrees with plan. States she will give it some time, will rest today and will seek evaluation prior to tomorrow's appointment if needed. Will reach out to our office with any further questions or concerns.

## 2020-11-01 NOTE — Telephone Encounter (Signed)
    Pt c/o of Chest Pain: STAT if CP now or developed within 24 hours  1. Are you having CP right now? Yes   2. Are you experiencing any other symptoms (ex. SOB, nausea, vomiting, sweating)? none  3. How long have you been experiencing CP? Started this morning  4. Is your CP continuous or coming and going? Coming and going   5. Have you taken Nitroglycerin? No   ?

## 2020-11-02 ENCOUNTER — Ambulatory Visit (INDEPENDENT_AMBULATORY_CARE_PROVIDER_SITE_OTHER): Payer: 59 | Admitting: Cardiovascular Disease

## 2020-11-02 ENCOUNTER — Encounter: Payer: Self-pay | Admitting: Cardiovascular Disease

## 2020-11-02 ENCOUNTER — Other Ambulatory Visit: Payer: Self-pay

## 2020-11-02 VITALS — BP 117/71 | HR 81 | Temp 96.8°F | Ht 63.0 in | Wt 166.8 lb

## 2020-11-02 DIAGNOSIS — I341 Nonrheumatic mitral (valve) prolapse: Secondary | ICD-10-CM | POA: Diagnosis not present

## 2020-11-02 DIAGNOSIS — I10 Essential (primary) hypertension: Secondary | ICD-10-CM

## 2020-11-02 DIAGNOSIS — I25119 Atherosclerotic heart disease of native coronary artery with unspecified angina pectoris: Secondary | ICD-10-CM

## 2020-11-02 DIAGNOSIS — E7800 Pure hypercholesterolemia, unspecified: Secondary | ICD-10-CM

## 2020-11-02 NOTE — Patient Instructions (Signed)
Medication Instructions:  OK TO TRY PEPCID OR ZANTAC OVER THE COUNTER AS NEEDED   Lab Work: NONE  Testing/Procedures: NONE  Follow-Up: At [facility], you and your health needs are our priority.  As part of our continuing mission to provide you with exceptional heart care, we have created designated Provider Care Teams.  These Care Teams include your primary Cardiologist (physician) and Advanced Practice Providers (APPs -  Physician Assistants and Nurse Practitioners) who all work together to provide you with the care you need, when you need it.  We recommend signing up for the patient portal called "MyChart".  Sign up information is provided on this After Visit Summary.  MyChart is used to connect with patients for Virtual Visits (Telemedicine).  Patients are able to view lab/test results, encounter notes, upcoming appointments, etc.  Non-urgent messages can be sent to your provider as well.   To learn more about what you can do with MyChart, go to [email or web address].    Your next appointment:   3 month(s)  The format for your next appointment:   In Person  Provider:   You may see Skeet Latch, MD or one of the following Advanced Practice Providers on your designated Care Team:    Kerin Ransom, PA-C  [place], [place]  [name], [place]

## 2020-11-02 NOTE — Progress Notes (Signed)
Cardiology Office Note   Date:  11/02/2020   ID:  Mary Holt, DOB 1961/02/21, MRN [id number]  PCP:  Mary Brine, FNP  Cardiologist:   Mary Latch, MD   No chief complaint on file.    History of Present Illness: Mary Holt is a 59 y.o. female with CAD s/p PCI, hypertension, diabetes,hyperlipidemia, and mitral valve prolapse here for follow up.  She was initially seen for hypertension 12/2019.  Mary Holt reported that when she was a young child she had episodes of chest pain.  She was evaluated and found to have mitral valve prolapse.  She previously took antibiotics prior to going to the dentist and developed a rash.  She was subsequently told that she no longer needed to take antibiotics.  She saw Mary Holt.  At that time her blood pressure was poorly-controlled.  She was started on his losartan/chlorthalidone.  Olmesartan was discontinued.  She was also started on simvastatin. At that appointment she reported episodes of chest pain that were occurring mostly when she lays down or with bending in certain positions.  However it also seems to be associated with stress.  Lately she has noticed that when working.  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  She does not get much formal exercise.  She was referred for coronary CT-A 01/2020 that revealed 25 to 49% ostial LAD disease followed by greater than 70% stenosis in ostial D1.  FFR was abnormal in this area.  She underwent LHC 02/2020 and had PCI of a 95% D2 and 95% LCX lesions. Simvastatin was switched to rosuvastatin.  After her procedure she continued to have some chest discomfort.  She was started on omeprazole which helped some.  However she stopped taking it because she did not think she needed it.  She saw Mary Holt 4/26 and still had some chest discomfort.  She was started on carvedilol and the discomfort seems to have improved significantly.  [name] cholesterol remained above goal.   She followed up with our pharmacist and was not interested in starting an injectable medicine so she added Nexletol.  She called our office yesterday reporting chest discomfort.  It started early in the morning while laying in bed.There was no associated shortness of breath.  She decided not to go to work because she was afraid.  She went for a walk that day and felt well.  She took a nitroglycerin and it seemed to help.  She did four weeks of cardiac rehab.  It was hard for her to go at 6 am because of her work schedule.   Past Medical History:  Diagnosis Date  . Angina pectoris ([place]) 02/22/2020  . CAD in native artery 02/22/2020  . Chest pain of uncertain etiology 5/42/7062  . Diabetes mellitus without complication ([place])   . Heart murmur    asymptomatic  . Hyperlipidemia   . Hypertension   . Kidney stones   . Kidney stones    several times, last  one 2014  . Mitral valve prolapse   . MVP (mitral valve prolapse)   . Pure hypercholesterolemia 05/02/2020  . Snoring 01/06/2020    Past Surgical History:  Procedure Laterality Date  . BACK SURGERY     x2  . BREAST SURGERY     cyst right breast  . CARDIAC CATHETERIZATION    . CARPAL TUNNEL RELEASE Bilateral   . CORONARY STENT INTERVENTION N/A 02/26/2020   Procedure: CORONARY STENT  INTERVENTION;  Surgeon: Mary Man, MD;  Location: [facility];  Service: Cardiovascular;  Laterality: N/A;  . DILATATION & CURRETTAGE/HYSTEROSCOPY WITH RESECTOCOPE N/A 08/11/2013   Procedure: DILATATION & CURETTAGE/HYSTEROSCOPY WITH RESECTOCOPE;  Surgeon: Mary Staff, MD;  Location: [facility];  Service: Gynecology;  Laterality: N/A;  . LEFT HEART CATH AND CORONARY ANGIOGRAPHY N/A 02/26/2020   Procedure: LEFT HEART CATH AND CORONARY ANGIOGRAPHY;  Surgeon: Mary Man, MD;  Location: [facility];  Service: Cardiovascular;  Laterality: N/A;  . TONSILLECTOMY    . TRIGGER FINGER RELEASE     left thumb  . TUBAL LIGATION       Current  Outpatient Medications  Medication Sig Dispense Refill  . aspirin EC 81 MG tablet Take 1 tablet (81 mg total) by mouth daily. 90 tablet 3  . Bempedoic Acid 180 MG TABS Take 1 tablet by mouth daily. 90 tablet 1  . carvedilol (COREG) 3.125 MG tablet TAKE 1 TABLET(3.125 MG) BY MOUTH TWICE DAILY 60 tablet 3  . clopidogrel (PLAVIX) 75 MG tablet Take 1 tablet (75 mg total) by mouth daily with breakfast. 90 tablet 1  . EDARBYCLOR 40-12.5 MG TABS TAKE 1 TABLET BY MOUTH  DAILY 90 tablet 3  . metFORMIN (GLUCOPHAGE) 500 MG tablet Take 1 tablet (500 mg total) by mouth 2 (two) times daily with a meal. Resume on 02/29/20. 180 tablet 1  . mometasone (ELOCON) 0.1 % cream Apply to affected area daily 45 g 2  . rosuvastatin (CRESTOR) 40 MG tablet Take 1 tablet (40 mg total) by mouth daily at 6 PM. 90 tablet 1  . sitaGLIPtin (JANUVIA) 25 MG tablet Take 1 tablet (25 mg total) by mouth daily. 90 tablet 0   No current facility-administered medications for this visit.    Allergies:   Nabumetone and Penicillins    Social History:  The patient  reports that she has never smoked. She has never used smokeless tobacco. She reports that she does not drink alcohol and does not use drugs.   Family History:  The patient's family history includes Breast cancer in her sister; Heart attack in her brother, brother, and father; Lymphoma in her sister; Ovarian cancer in her sister; Stroke in her mother.    ROS:  Please see the history of present illness.   Otherwise, review of systems are positive for none.   All other systems are reviewed and negative.    PHYSICAL EXAM: VS:  BP 117/71   Pulse 81   Temp (!) 96.8 F (36 C)   Ht 5\' 3"  (1.6 m)   Wt 166 lb 12.8 oz (75.7 kg)   LMP 09/30/2014   SpO2 98%   BMI 29.55 kg/m  , BMI Body mass index is 29.55 kg/m. GENERAL:  Well appearing.  No acute distress HEENT: Pupils equal round and reactive, fundi not visualized, oral mucosa unremarkable NECK:  No jugular venous  distention, waveform within normal limits, carotid upstroke brisk and symmetric, no bruits LUNGS:  Clear to auscultation bilaterally HEART:  RRR.  PMI not displaced or sustained,S1 and S2 within normal limits, no S3, no S4, no clicks, no rubs, no murmurs ABD:  Flat, positive bowel sounds normal in frequency in pitch, no bruits, no rebound, no guarding, no midline pulsatile mass, no hepatomegaly, no splenomegaly EXT:  2 plus pulses throughout, no edema, no cyanosis no clubbing SKIN:  No rashes no nodules NEURO:  Cranial nerves II through XII grossly intact, motor grossly intact throughout  PSYCH:  Cognitively intact, oriented to person place and time   EKG:  EKG is ordered today. The ekg ordered 01/06/2020 demonstrates sinus rhythm 99 bpm.  Cannot rule out prior inferior infarct. 11/02/2020: Sinus rhythm.  Rate 81 bpm.  Echo 01/20/20:   1. Left ventricular ejection fraction, by visual estimation, is 60 to  65%. The left ventricle has normal function. There is no left ventricular  hypertrophy.  2. The left ventricle has no regional wall motion abnormalities.  3. Global right ventricle has normal systolic function.The right  ventricular size is normal. No increase in right ventricular wall  thickness.  4. Left atrial size was normal.  5. Right atrial size was normal.  6. The mitral valve is normal in structure. Trivial mitral valve  regurgitation. No evidence of mitral stenosis.  7. Prolapse not appreciated.  8. The tricuspid valve is normal in structure.  9. The tricuspid valve is normal in structure. Tricuspid valve  regurgitation is trivial.  10. The aortic valve is normal in structure. Aortic valve regurgitation is  not visualized. No evidence of aortic valve sclerosis or stenosis.  11. The pulmonic valve was normal in structure. Pulmonic valve  regurgitation is trivial.   LHC 02/26/20:  LESION #1: 2nd Diag lesion is 95% stenosed.  A drug-eluting stent was successfully  placed using a STENT RESOLUTE ONYX 2.0X8. Stable post dilation from 2.2 to 2.3 mm  Post intervention, there is a 0% residual stenosis.  --------------  LESION #2: Prox Cx to Mid Cx (OM 2) lesion is 95% stenosed. This lesion was not assessed by CT FFR, but is clearly significant.  A drug-eluting stent was successfully placed using a STENT RESOLUTE ONYX 2.0X12. Postdilated to 2.2 mm  Post intervention, there is a 0% residual stenosis.  --------------  Mid LAD lesion is 35% stenosed. Dist LAD lesion is 40% stenosed.  Prox RCA to Mid RCA lesion is 45% stenosed. Dist RCA lesion is 45% stenosed. -By CT FFR, summation of these 2 lesions was not visualized significant. (0.81)    Recent Labs: 02/23/2020: Hemoglobin 12.8; Platelets 361 09/29/2020: ALT 35; BUN 21; Creatinine, Ser 0.92; Potassium 4.5; Sodium 138    Lipid Panel    Component Value Date/Time   CHOL 126 09/29/2020 1050   TRIG 76 09/29/2020 1050   HDL 43 09/29/2020 1050   CHOLHDL 2.9 09/29/2020 1050   LDLCALC 68 09/29/2020 1050      Wt Readings from Last 3 Encounters:  11/02/20 166 lb 12.8 oz (75.7 kg)  09/19/20 171 lb 9.6 oz (77.8 kg)  07/20/20 177 lb 11.1 oz (80.6 kg)      ASSESSMENT AND PLAN:  # CAD:  # Hyperlipidemia:  Ms. Libman had successful PCI of the OM2 and D2.  She has residual mild disease that is medically managed.  Her current symptoms do not seem consistent with ischemia.  It is more consistent with GERD.  We discussed nonpharmacologic changes.  We also discussed resuming her omeprazole daily.  However she is not interested in starting back another daily medication.  She can take Zantac or Pepcid as needed.  Offered exercise through the PrEP program through the [facility] but is too far for her.  Continue aspirin, carvedilol, clopidogrel, and rosuvastatin.  She can stop Plavix 02/2021.   # Snoring: Sleep study pending.  # Mitral valve prolapse: Mild systolic murmur on exam.  No MVP on echo 12/2019.  #  Hypertension: Blood pressure well-controlled.  Continue carvedilol and Edarbyclor.   Current  medicines are reviewed at length with the patient today.  The patient does not have concerns regarding medicines.  The following changes have been made:  no change  Labs/ tests ordered today include:   No orders of the defined types were placed in this encounter.    Disposition:   FU with Malayjah Otoole C. Oval Linsey, MD, [facility] in 3 months   Signed, Nini Cavan C. Oval Linsey, MD, [facility]  11/02/2020 1:14 PM    [facility]

## 2020-11-20 ENCOUNTER — Other Ambulatory Visit: Payer: Self-pay | Admitting: Nurse Practitioner

## 2020-12-26 ENCOUNTER — Other Ambulatory Visit: Payer: Self-pay

## 2020-12-26 ENCOUNTER — Telehealth: Payer: Self-pay

## 2020-12-26 ENCOUNTER — Encounter: Payer: Self-pay | Admitting: Nurse Practitioner

## 2020-12-26 ENCOUNTER — Telehealth (INDEPENDENT_AMBULATORY_CARE_PROVIDER_SITE_OTHER): Payer: 59 | Admitting: Nurse Practitioner

## 2020-12-26 VITALS — Wt 166.0 lb

## 2020-12-26 DIAGNOSIS — U071 COVID-19: Secondary | ICD-10-CM | POA: Diagnosis not present

## 2020-12-26 DIAGNOSIS — R059 Cough, unspecified: Secondary | ICD-10-CM

## 2020-12-26 MED ORDER — BENZONATATE 100 MG PO CAPS: 100.0000 mg | ORAL_CAPSULE | Freq: Four times a day (QID) | ORAL | 1 refills | Status: DC | PRN

## 2020-12-26 NOTE — Progress Notes (Signed)
Virtual Visit via Telephone    This visit type was conducted due to national recommendations for restrictions regarding the COVID-19 Pandemic (e.g. social distancing) in an effort to limit this patient's exposure and mitigate transmission in our community.  Due to her co-morbid illnesses, this patient is at least at moderate risk for complications without adequate follow up.  This format is felt to be most appropriate for this patient at this time.  All issues noted in this document were discussed and addressed.  A limited physical exam was performed with this format.    This visit type was conducted due to national recommendations for restrictions regarding the COVID-19 Pandemic (e.g. social distancing) in an effort to limit this patient's exposure and mitigate transmission in our community.  Patients identity confirmed using two different identifiers.  This format is felt to be most appropriate for this patient at this time.  All issues noted in this document were discussed and addressed.  No physical exam was performed (except for noted visual exam findings with Video Visits).    Date:  12/26/2020   ID:  Mary Holt, DOB 1961/05/10, MRN [id number]  Patient Location:  Home - spoke with Sharalyn Ink  Provider location:   Office    Chief Complaint:  Positive covid and cough  History of Present Illness:    Mary Holt is a 60 y.o. female who presents via video conferencing for a telehealth visit today.    The patient does not have symptoms concerning for COVID-19 infection (fever, chills, cough, or new shortness of breath).   She was positive for covid one week ago Monday at [facility]. She is having a cough. She has been feeling weak in her body, denies shortness of breath. Her son was in town from [place] - step niece had a get together on December 18th both of her sons are positive. She has had 2 vaccines so far.  She did have weakness and had 911 to come and evaluate  and indicated she was dehydrated. She has tried mucinex causes her nauseated. Alka seltzer cold cough medication.   She is requesting a form from [employer].      Past Medical History:  Diagnosis Date  . Angina pectoris (HCC) 02/22/2020  . CAD in native artery 02/22/2020  . Chest pain of uncertain etiology 01/06/2020  . Diabetes mellitus without complication (HCC)   . Heart murmur    asymptomatic  . Hyperlipidemia   . Hypertension   . Kidney stones   . Kidney stones    several times, last  one 2014  . Mitral valve prolapse   . MVP (mitral valve prolapse)   . Pure hypercholesterolemia 05/02/2020  . Snoring 01/06/2020   Past Surgical History:  Procedure Laterality Date  . BACK SURGERY     x2  . BREAST SURGERY     cyst right breast  . CARDIAC CATHETERIZATION    . CARPAL TUNNEL RELEASE Bilateral   . CORONARY STENT INTERVENTION N/A 02/26/2020   Procedure: CORONARY STENT INTERVENTION;  Surgeon: Marykay Lex, MD;  Location: [facility] IN[facility];  Service: Cardiovascular;  Laterality: N/A;  . DILATATION & CURRETTAGE/HYSTEROSCOPY WITH RESECTOCOPE N/A 08/11/2013   Procedure: DILATATION & CURETTAGE/HYSTEROSCOPY WITH RESECTOCOPE;  Surgeon: Serita Kyle, MD;  Location: [facility];  Service: Gynecology;  Laterality: N/A;  . LEFT HEART CATH AND CORONARY ANGIOGRAPHY N/A 02/26/2020   Procedure: LEFT HEART CATH AND CORONARY ANGIOGRAPHY;  Surgeon: Marykay Lex, MD;  Location: [facility];  Service: Cardiovascular;  Laterality: N/A;  . TONSILLECTOMY    . TRIGGER FINGER RELEASE     left thumb  . TUBAL LIGATION       Current Meds  Medication Sig  . aspirin EC 81 MG tablet Take 1 tablet (81 mg total) by mouth daily.  . Bempedoic Acid 180 MG TABS Take 1 tablet by mouth daily.  . benzonatate (TESSALON PERLES) 100 MG capsule Take 1 capsule (100 mg total) by mouth every 6 (six) hours as needed.  . carvedilol (COREG) 3.125 MG tablet TAKE 1 TABLET(3.125 MG) BY MOUTH TWICE DAILY  . clopidogrel  (PLAVIX) 75 MG tablet Take 1 tablet (75 mg total) by mouth daily with breakfast.  . EDARBYCLOR 40-12.5 MG TABS TAKE 1 TABLET BY MOUTH  DAILY  . JANUVIA 25 MG tablet TAKE 1 TABLET BY MOUTH  DAILY  . metFORMIN (GLUCOPHAGE) 500 MG tablet Take 1 tablet (500 mg total) by mouth 2 (two) times daily with a meal. Resume on 02/29/20.  Marland Kitchen mometasone (ELOCON) 0.1 % cream Apply to affected area daily  . rosuvastatin (CRESTOR) 40 MG tablet Take 1 tablet (40 mg total) by mouth daily at 6 PM.     Allergies:   Nabumetone and Penicillins   Social History   Tobacco Use  . Smoking status: Never Smoker  . Smokeless tobacco: Never Used  Substance Use Topics  . Alcohol use: No  . Drug use: No     Family Hx: The patient's family history includes Breast cancer in her sister; Heart attack in her brother, brother, and father; Lymphoma in her sister; Ovarian cancer in her sister; Stroke in her mother. There is no history of Colon cancer, Colon polyps, Esophageal cancer, Rectal cancer, or Stomach cancer.  ROS:   Please see the history of present illness.    Review of Systems  Constitutional: Positive for malaise/fatigue. Negative for fever.  Respiratory: Positive for cough. Negative for sputum production and shortness of breath.   Cardiovascular: Negative.   Neurological: Negative for dizziness and tingling.  Psychiatric/Behavioral: Negative.     All other systems reviewed and are negative.   Labs/Other Tests and Data Reviewed:    Recent Labs: 02/23/2020: Hemoglobin 12.8; Platelets 361 09/29/2020: ALT 35; BUN 21; Creatinine, Ser 0.92; Potassium 4.5; Sodium 138   Recent Lipid Panel Lab Results  Component Value Date/Time   CHOL 126 09/29/2020 10:50 AM   TRIG 76 09/29/2020 10:50 AM   HDL 43 09/29/2020 10:50 AM   CHOLHDL 2.9 09/29/2020 10:50 AM   LDLCALC 68 09/29/2020 10:50 AM    Wt Readings from Last 3 Encounters:  12/26/20 166 lb (75.3 kg)  11/02/20 166 lb 12.8 oz (75.7 kg)  09/19/20 171 lb 9.6 oz  (77.8 kg)     Exam:    Vital Signs:  Wt 166 lb (75.3 kg)   LMP 09/30/2014   BMI 29.41 kg/m     Physical Exam Constitutional:      General: She is not in acute distress.    Appearance: Normal appearance.  Pulmonary:     Effort: Pulmonary effort is normal. No respiratory distress.  Neurological:     Mental Status: She is alert and oriented to person, place, and time.  Psychiatric:        Mood and Affect: Mood normal.        Behavior: Behavior normal.        Thought Content: Thought content normal.  Judgment: Judgment normal.     ASSESSMENT & PLAN:    1. Lab test positive for detection of COVID-19 virus Tested positive for covid on Dec 27, she is to remain out of work until 1/10 Note will be sent to patient and she is to send copy of form for work to be completed, she is aware she needs to pay a fee for the form   2. Cough  Persistent cough not responding to over the counter medications  Will send tessalon perles to pharmacy if worse she is to call office or go to ER if shortness of breath or chest pain - benzonatate (TESSALON PERLES) 100 MG capsule; Take 1 capsule (100 mg total) by mouth every 6 (six) hours as needed.  Dispense: 30 capsule; Refill: 1    COVID-19 Education: The signs and symptoms of COVID-19 were discussed with the patient and how to seek care for testing (follow up with PCP or arrange E-visit).  The importance of social distancing was discussed today.  Patient Risk:   After full review of this patients clinical status, I feel that they are at least moderate risk at this time.  Time:   Today, I have spent 11 minutes/ seconds with the patient with telehealth technology discussing above diagnoses.     Medication Adjustments/Labs and Tests Ordered: Current medicines are reviewed at length with the patient today.  Concerns regarding medicines are outlined above.   Tests Ordered: No orders of the defined types were placed in this  encounter.   Medication Changes: Meds ordered this encounter  Medications  . benzonatate (TESSALON PERLES) 100 MG capsule    Sig: Take 1 capsule (100 mg total) by mouth every 6 (six) hours as needed.    Dispense:  30 capsule    Refill:  1    Disposition:  Follow up prn  Signed, [name], FNP

## 2020-12-26 NOTE — Telephone Encounter (Signed)
The pt was scheduled a phone appt for a cough because she only has a land line and no computer

## 2021-01-09 ENCOUNTER — Other Ambulatory Visit: Payer: Self-pay | Admitting: Nurse Practitioner

## 2021-01-09 DIAGNOSIS — E119 Type 2 diabetes mellitus without complications: Secondary | ICD-10-CM

## 2021-01-13 ENCOUNTER — Other Ambulatory Visit: Payer: Self-pay | Admitting: Pharmacist

## 2021-02-02 ENCOUNTER — Ambulatory Visit: Payer: 59 | Admitting: Cardiovascular Disease

## 2021-02-09 ENCOUNTER — Encounter: Payer: 59 | Admitting: Nurse Practitioner

## 2021-03-08 ENCOUNTER — Ambulatory Visit: Payer: 59 | Admitting: Cardiovascular Disease

## 2021-03-23 ENCOUNTER — Telehealth: Payer: Self-pay

## 2021-03-23 NOTE — Telephone Encounter (Signed)
Called lm pt to let the pt know that her pa for nexletol was approved on [id number] from 2021-03-01 to 2022-03-01 and to call us if they need a refill

## 2021-03-29 ENCOUNTER — Encounter: Payer: Self-pay | Admitting: Nurse Practitioner

## 2021-03-29 ENCOUNTER — Other Ambulatory Visit: Payer: Self-pay

## 2021-03-29 ENCOUNTER — Ambulatory Visit (INDEPENDENT_AMBULATORY_CARE_PROVIDER_SITE_OTHER): Payer: 59 | Admitting: Nurse Practitioner

## 2021-03-29 VITALS — BP 120/72 | HR 92 | Temp 99.0°F | Ht 62.8 in | Wt 171.8 lb

## 2021-03-29 DIAGNOSIS — Z8616 Personal history of COVID-19: Secondary | ICD-10-CM

## 2021-03-29 DIAGNOSIS — E119 Type 2 diabetes mellitus without complications: Secondary | ICD-10-CM

## 2021-03-29 DIAGNOSIS — Z0000 Encounter for general adult medical examination without abnormal findings: Secondary | ICD-10-CM

## 2021-03-29 DIAGNOSIS — Z1231 Encounter for screening mammogram for malignant neoplasm of breast: Secondary | ICD-10-CM

## 2021-03-29 DIAGNOSIS — E7800 Pure hypercholesterolemia, unspecified: Secondary | ICD-10-CM

## 2021-03-29 DIAGNOSIS — R509 Fever, unspecified: Secondary | ICD-10-CM | POA: Diagnosis not present

## 2021-03-29 DIAGNOSIS — I10 Essential (primary) hypertension: Secondary | ICD-10-CM | POA: Diagnosis not present

## 2021-03-29 DIAGNOSIS — Z6830 Body mass index (BMI) 30.0-30.9, adult: Secondary | ICD-10-CM

## 2021-03-29 DIAGNOSIS — E6609 Other obesity due to excess calories: Secondary | ICD-10-CM

## 2021-03-29 LAB — POCT URINALYSIS DIPSTICK
Bilirubin, UA: NEGATIVE
Glucose, UA: NEGATIVE
Ketones, UA: NEGATIVE
Leukocytes, UA: NEGATIVE
Nitrite, UA: NEGATIVE
Protein, UA: NEGATIVE
Spec Grav, UA: 1.02 (ref 1.010–1.025)
Urobilinogen, UA: 0.2 E.U./dL
pH, UA: 7.5 (ref 5.0–8.0)

## 2021-03-29 LAB — POCT UA - MICROALBUMIN
Albumin/Creatinine Ratio, Urine, POC: <30
Creatinine, POC: 200 mg/dL
Microalbumin Ur, POC: 30 mg/L

## 2021-03-29 NOTE — Progress Notes (Signed)
I,Yamilka Roman [employer] as a Education administrator for [employer], FNP.,have documented all relevant documentation on the behalf of [name], FNP,as directed by  [name], FNP while in the presence of [name], [place]. This visit occurred during the SARS-CoV-2 public health emergency.  Safety protocols were in place, including screening questions prior to the visit, additional usage of staff PPE, and extensive cleaning of exam room while observing appropriate contact time as indicated for disinfecting solutions.  Subjective:     Patient ID: Mary Holt , female    DOB: [birth year] , 60 y.o.   MRN: [id number]   Chief Complaint  Patient presents with  . Annual Exam    HPI  Here for hm.  She denies any cold symptoms.    Wt Readings from Last 3 Encounters: 03/29/21 : 171 lb 12.8 oz (77.9 kg) 12/26/20 : 166 lb (75.3 kg) 11/02/20 : 166 lb 12.8 oz (75.7 kg)    Past Medical History:  Diagnosis Date  . Angina pectoris ([place]) 02/22/2020  . CAD in native artery 02/22/2020  . Chest pain of uncertain etiology 5/40/0867  . Diabetes mellitus without complication ([place])   . Heart murmur    asymptomatic  . Hyperlipidemia   . Hypertension   . Kidney stones   . Kidney stones    several times, last  one 2014  . Mitral valve prolapse   . MVP (mitral valve prolapse)   . Pure hypercholesterolemia 05/02/2020  . Snoring 01/06/2020     Family History  Problem Relation Age of Onset  . Stroke Mother   . Heart attack Father   . Breast cancer Sister   . Heart attack Brother   . Ovarian cancer Sister   . Lymphoma Sister   . Heart attack Brother   . Colon cancer Neg Hx   . Colon polyps Neg Hx   . Esophageal cancer Neg Hx   . Rectal cancer Neg Hx   . Stomach cancer Neg Hx      Current Outpatient Medications:  .  aspirin EC 81 MG tablet, Take 1 tablet (81 mg total) by mouth daily., Disp: 90 tablet, Rfl: 3 .  carvedilol (COREG) 3.125 MG tablet, TAKE 1 TABLET(3.125 MG) BY MOUTH TWICE DAILY,  Disp: 60 tablet, Rfl: 3 .  EDARBYCLOR 40-12.5 MG TABS, TAKE 1 TABLET BY MOUTH  DAILY, Disp: 90 tablet, Rfl: 3 .  JANUVIA 25 MG tablet, TAKE 1 TABLET BY MOUTH  DAILY, Disp: 90 tablet, Rfl: 3 .  metFORMIN (GLUCOPHAGE) 500 MG tablet, TAKE 1 TABLET BY MOUTH  TWICE DAILY WITH A MEAL, Disp: 180 tablet, Rfl: 3 .  rosuvastatin (CRESTOR) 40 MG tablet, TAKE 1 TABLET BY MOUTH  DAILY AT 6 PM, Disp: 90 tablet, Rfl: 3   Allergies  Allergen Reactions  . Nabumetone Rash  . Penicillins Rash    Did it involve swelling of the face/tongue/throat, SOB, or low BP? Yes Did it involve sudden or severe rash/hives, skin peeling, or any reaction on the inside of your mouth or nose? No Did you need to seek medical attention at a hospital or doctor's office? No When did it last happen? >10 years ago If all above answers are "NO", may proceed with cephalosporin use.       The patient states she uses post menopausal status for birth control.  Patient's last menstrual period was 09/30/2014.. Negative for Dysmenorrhea and Negative for Menorrhagia. Negative for: breast discharge, breast lump(s), breast pain and breast self exam. Associated symptoms  include abnormal vaginal bleeding. Pertinent negatives include abnormal bleeding (hematology), anxiety, decreased libido, depression, difficulty falling sleep, dyspareunia, history of infertility, nocturia, sexual dysfunction, sleep disturbances, urinary incontinence, urinary urgency, vaginal discharge and vaginal itching. Diet regular. Does not eat breakfast; eats salads oftern. The patient states her exercise level is minimal - she has been working an increased amount of overtime.   The patient's tobacco use is:  Social History   Tobacco Use  Smoking Status Never Smoker  Smokeless Tobacco Never Used   She has been exposed to passive smoke. The patient's alcohol use is:  Social History   Substance and Sexual Activity  Alcohol Use No   Additional information: Last pap  02/02/2019, next one scheduled for 02/02/2022 - she     Review of Systems  Constitutional: Negative.   HENT: Negative.   Eyes: Negative.   Respiratory: Negative.   Cardiovascular: Negative.  Negative for chest pain, palpitations and leg swelling.  Gastrointestinal: Negative.   Endocrine: Negative.   Genitourinary: Negative.   Musculoskeletal: Negative.   Skin: Negative.   Allergic/Immunologic: Negative.   Neurological: Negative.   Hematological: Negative.   Psychiatric/Behavioral: Negative.      Today's Vitals   03/29/21 1457  BP: 120/72  Pulse: 92  Temp: 99 F (37.2 C)  TempSrc: Oral  Weight: 171 lb 12.8 oz (77.9 kg)  Height: 5' 2.8" (1.595 m)  PainSc: 0-No pain   Body mass index is 30.63 kg/m.   Objective:  Physical Exam Vitals reviewed.  Constitutional:      General: She is not in acute distress.    Appearance: Normal appearance. She is well-developed. She is obese.  HENT:     Head: Normocephalic and atraumatic.     Right Ear: Hearing, tympanic membrane, ear canal and external ear normal. There is no impacted cerumen.     Left Ear: Hearing, tympanic membrane, ear canal and external ear normal. There is no impacted cerumen.     Nose:     Comments: Deferred - masked    Mouth/Throat:     Comments: Deferred - masked Eyes:     General: Lids are normal.     Extraocular Movements: Extraocular movements intact.     Pupils: Pupils are equal, round, and reactive to light.     Funduscopic exam:    Right eye: No papilledema.        Left eye: No papilledema.  Neck:     Thyroid: No thyroid mass.     Vascular: No carotid bruit.  Cardiovascular:     Rate and Rhythm: Normal rate and regular rhythm.     Pulses: Normal pulses.     Heart sounds: Normal heart sounds. No murmur heard.   Pulmonary:     Effort: Pulmonary effort is normal. No respiratory distress.     Breath sounds: Normal breath sounds. No wheezing.  Chest:     Chest wall: No mass.  Breasts:     Tanner  Score is 5.     Right: Normal. No mass, tenderness, axillary adenopathy or supraclavicular adenopathy.     Left: Normal. No mass, tenderness, axillary adenopathy or supraclavicular adenopathy.    Abdominal:     General: Abdomen is flat. Bowel sounds are normal. There is no distension.     Palpations: Abdomen is soft.     Tenderness: There is no abdominal tenderness.  Genitourinary:    Rectum: Guaiac result negative.  Musculoskeletal:        General: No swelling. Normal  range of motion.     Cervical back: Full passive range of motion without pain, normal range of motion and neck supple.     Right lower leg: No edema.     Left lower leg: No edema.  Lymphadenopathy:     Upper Body:     Right upper body: No supraclavicular, axillary or pectoral adenopathy.     Left upper body: No supraclavicular, axillary or pectoral adenopathy.  Skin:    General: Skin is warm and dry.     Capillary Refill: Capillary refill takes less than 2 seconds.  Neurological:     General: No focal deficit present.     Mental Status: She is alert and oriented to person, place, and time.     Cranial Nerves: No cranial nerve deficit.     Sensory: No sensory deficit.  Psychiatric:        Mood and Affect: Mood normal.        Behavior: Behavior normal.        Thought Content: Thought content normal.        Judgment: Judgment normal.         Assessment And Plan:     1. Encounter for general adult medical examination w/o abnormal findings . Behavior modifications discussed and diet history reviewed.   . Pt will continue to exercise regularly and modify diet with low GI, plant based foods and decrease intake of processed foods.  . Recommend intake of daily multivitamin, Vitamin D, and calcium.  . Recommend mammogram and colonoscopy for preventive screenings, as well as recommend immunizations that include influenza, TDAP (up to date) - Lipid panel  2. Encounter for screening mammogram for malignant neoplasm of  breast  Pt instructed on Self Breast Exam.According to ACOG guidelines Women aged 90 and older are recommended to get an annual mammogram. Order placed with [facility] for appointment scheduling.   Pt encouraged to get annual mammogram - MM Digital Screening; Future  3. Essential hypertension . B/P is well controlled.  . CMP ordered to check renal function.  . The importance of regular exercise and dietary modification was stressed to the patient.  . Stressed importance of losing ten percent of her body weight to help with B/P control.  . Continue follow up with Cardiology - CMP14+EGFR - CBC  4. Type 2 diabetes mellitus without complication, without long-term current use of insulin  (HCC)  Chronic, fair control  Continue with current medications  Encouraged to limit intake of sugary foods and drinks  Encouraged to increase physical activity to 150 minutes per week - POCT Urinalysis Dipstick (81002) - POCT UA - Microalbumin - Hemoglobin A1c  5. BMI 30.0-30.9,adult  Chronic  Discussed healthy diet and regular exercise options   Encouraged to exercise at least 150 minutes per week with 2 days of strength training  6. Pure hypercholesterolemia  Chronic, controlled  Continue with current medications, tolerating well  7. Elevated temperature  Will check for covid, denies any symptoms - Novel Coronavirus, NAA ([facility])  8. History of COVID-19  Overall doing well since covid     Patient was given opportunity to ask questions. Patient verbalized understanding of the plan and was able to repeat key elements of the plan. All questions were answered to their satisfaction.   [name], FNP   I, [name], FNP, have reviewed all documentation for this visit. The documentation on 04/21/21 for the exam, diagnosis, procedures, and orders are all accurate and complete.  THE  PATIENT IS ENCOURAGED TO PRACTICE SOCIAL DISTANCING DUE TO THE COVID-19 PANDEMIC.   

## 2021-03-29 NOTE — Patient Instructions (Signed)
Health Maintenance, Female Adopting a healthy lifestyle and getting preventive care are important in promoting health and wellness. Ask your health care provider about:  The right schedule for you to have regular tests and exams.  Things you can do on your own to prevent diseases and keep yourself healthy. What should I know about diet, weight, and exercise? Eat a healthy diet  Eat a diet that includes plenty of vegetables, fruits, low-fat dairy products, and lean protein.  Do not eat a lot of foods that are high in solid fats, added sugars, or sodium.   Maintain a healthy weight Body mass index (BMI) is used to identify weight problems. It estimates body fat based on height and weight. Your health care provider can help determine your BMI and help you achieve or maintain a healthy weight. Get regular exercise Get regular exercise. This is one of the most important things you can do for your health. Most adults should:  Exercise for at least 150 minutes each week. The exercise should increase your heart rate and make you sweat (moderate-intensity exercise).  Do strengthening exercises at least twice a week. This is in addition to the moderate-intensity exercise.  Spend less time sitting. Even light physical activity can be beneficial. Watch cholesterol and blood lipids Have your blood tested for lipids and cholesterol at 61 years of age, then have this test every 5 years. Have your cholesterol levels checked more often if:  Your lipid or cholesterol levels are high.  You are older than 61 years of age.  You are at high risk for heart disease. What should I know about cancer screening? Depending on your health history and family history, you may need to have cancer screening at various ages. This may include screening for:  Breast cancer.  Cervical cancer.  Colorectal cancer.  Skin cancer.  Lung cancer. What should I know about heart disease, diabetes, and high blood  pressure? Blood pressure and heart disease  High blood pressure causes heart disease and increases the risk of stroke. This is more likely to develop in people who have high blood pressure readings, are of African descent, or are overweight.  Have your blood pressure checked: ? Every 3-5 years if you are 18-39 years of age. ? Every year if you are 40 years old or older. Diabetes Have regular diabetes screenings. This checks your fasting blood sugar level. Have the screening done:  Once every three years after age 40 if you are at a normal weight and have a low risk for diabetes.  More often and at a younger age if you are overweight or have a high risk for diabetes. What should I know about preventing infection? Hepatitis B If you have a higher risk for hepatitis B, you should be screened for this virus. Talk with your health care provider to find out if you are at risk for hepatitis B infection. Hepatitis C Testing is recommended for:  Everyone born from 1945 through 1965.  Anyone with known risk factors for hepatitis C. Sexually transmitted infections (STIs)  Get screened for STIs, including gonorrhea and chlamydia, if: ? You are sexually active and are younger than 61 years of age. ? You are older than 61 years of age and your health care provider tells you that you are at risk for this type of infection. ? Your sexual activity has changed since you were last screened, and you are at increased risk for chlamydia or gonorrhea. Ask your health care provider   if you are at risk.  Ask your health care provider about whether you are at high risk for HIV. Your health care provider may recommend a prescription medicine to help prevent HIV infection. If you choose to take medicine to prevent HIV, you should first get tested for HIV. You should then be tested every 3 months for as long as you are taking the medicine. Pregnancy  If you are about to stop having your period (premenopausal) and  you may become pregnant, seek counseling before you get pregnant.  Take 400 to 800 micrograms (mcg) of folic acid every day if you become pregnant.  Ask for birth control (contraception) if you want to prevent pregnancy. Osteoporosis and menopause Osteoporosis is a disease in which the bones lose minerals and strength with aging. This can result in bone fractures. If you are 65 years old or older, or if you are at risk for osteoporosis and fractures, ask your health care provider if you should:  Be screened for bone loss.  Take a calcium or vitamin D supplement to lower your risk of fractures.  Be given hormone replacement therapy (HRT) to treat symptoms of menopause. Follow these instructions at home: Lifestyle  Do not use any products that contain nicotine or tobacco, such as cigarettes, e-cigarettes, and chewing tobacco. If you need help quitting, ask your health care provider.  Do not use street drugs.  Do not share needles.  Ask your health care provider for help if you need support or information about quitting drugs. Alcohol use  Do not drink alcohol if: ? Your health care provider tells you not to drink. ? You are pregnant, may be pregnant, or are planning to become pregnant.  If you drink alcohol: ? Limit how much you use to 0-1 drink a day. ? Limit intake if you are breastfeeding.  Be aware of how much alcohol is in your drink. In the [place] one drink equals one 12 oz bottle of beer (355 mL), one 5 oz glass of wine (148 mL), or one 1 oz glass of hard liquor (44 mL). General instructions  Schedule regular health, dental, and eye exams.  Stay current with your vaccines.  Tell your health care provider if: ? You often feel depressed. ? You have ever been abused or do not feel safe at home. Summary  Adopting a healthy lifestyle and getting preventive care are important in promoting health and wellness.  Follow your health care provider's instructions about healthy  diet, exercising, and getting tested or screened for diseases.  Follow your health care provider's instructions on monitoring your cholesterol and blood pressure. This information is not intended to replace advice given to you by your health care provider. Make sure you discuss any questions you have with your health care provider. Document Revised: 12/03/2018 Document Reviewed: 12/03/2018 Elsevier Patient Education  2021 Elsevier Inc.  

## 2021-03-30 LAB — CMP14+EGFR
ALT: 32 IU/L (ref 0–32)
AST: 28 IU/L (ref 0–40)
Albumin/Globulin Ratio: 1.7 (ref 1.2–2.2)
Albumin: 4.8 g/dL (ref 3.8–4.9)
Alkaline Phosphatase: 81 IU/L (ref 44–121)
BUN/Creatinine Ratio: 26 (ref 12–28)
BUN: 21 mg/dL (ref 8–27)
Bilirubin Total: 0.2 mg/dL (ref 0.0–1.2)
CO2: 23 mmol/L (ref 20–29)
Calcium: 9.6 mg/dL (ref 8.7–10.3)
Chloride: 100 mmol/L (ref 96–106)
Creatinine, Ser: 0.82 mg/dL (ref 0.57–1.00)
Globulin, Total: 2.9 g/dL (ref 1.5–4.5)
Glucose: 89 mg/dL (ref 65–99)
Potassium: 4.2 mmol/L (ref 3.5–5.2)
Sodium: 138 mmol/L (ref 134–144)
Total Protein: 7.7 g/dL (ref 6.0–8.5)
eGFR: 82 mL/min/{1.73_m2} (ref >59)

## 2021-03-30 LAB — HEMOGLOBIN A1C
Est. average glucose Bld gHb Est-mCnc: 154 mg/dL
Hgb A1c MFr Bld: 7 % — ABNORMAL HIGH (ref 4.8–5.6)

## 2021-03-30 LAB — LIPID PANEL
Chol/HDL Ratio: 2.7 ratio (ref 0.0–4.4)
Cholesterol, Total: 134 mg/dL (ref 100–199)
HDL: 49 mg/dL
LDL Chol Calc (NIH): 70 mg/dL (ref 0–99)
Triglycerides: 78 mg/dL (ref 0–149)
VLDL Cholesterol Cal: 15 mg/dL (ref 5–40)

## 2021-03-30 LAB — CBC
Hematocrit: 35.8 % (ref 34.0–46.6)
Hemoglobin: 11.7 g/dL (ref 11.1–15.9)
MCH: 27.8 pg (ref 26.6–33.0)
MCHC: 32.7 g/dL (ref 31.5–35.7)
MCV: 85 fL (ref 79–97)
Platelets: 386 x10E3/uL (ref 150–450)
RBC: 4.21 x10E6/uL (ref 3.77–5.28)
RDW: 13.2 % (ref 11.7–15.4)
WBC: 8.7 x10E3/uL (ref 3.4–10.8)

## 2021-03-30 LAB — NOVEL CORONAVIRUS, NAA: SARS-CoV-2, NAA: NOT DETECTED

## 2021-03-30 LAB — SARS-COV-2, NAA 2 DAY TAT

## 2021-03-31 NOTE — Progress Notes (Signed)
Cardiology Clinic Note   Patient Name: Mary Holt Date of Encounter: 04/03/2021  Primary Care Provider:  [name], [place] Primary Cardiologist:  Skeet Latch, MD  Patient Profile    Mary Kluver. Holt 60 year old female presents today for follow-up of her coronary artery disease and essential hypertension.    Past Medical History    Past Medical History:  Diagnosis Date  . Angina pectoris ([place]) 02/22/2020  . CAD in native artery 02/22/2020  . Chest pain of uncertain etiology 6/50/3546  . Diabetes mellitus without complication ([place])   . Heart murmur    asymptomatic  . Hyperlipidemia   . Hypertension   . Kidney stones   . Kidney stones    several times, last  one 2014  . Mitral valve prolapse   . MVP (mitral valve prolapse)   . Pure hypercholesterolemia 05/02/2020  . Snoring 01/06/2020   Past Surgical History:  Procedure Laterality Date  . BACK SURGERY     x2  . BREAST SURGERY     cyst right breast  . CARDIAC CATHETERIZATION    . CARPAL TUNNEL RELEASE Bilateral   . CORONARY STENT INTERVENTION N/A 02/26/2020   Procedure: CORONARY STENT INTERVENTION;  Surgeon: Leonie Man, MD;  Location: [facility];  Service: Cardiovascular;  Laterality: N/A;  . DILATATION & CURRETTAGE/HYSTEROSCOPY WITH RESECTOCOPE N/A 08/11/2013   Procedure: DILATATION & CURETTAGE/HYSTEROSCOPY WITH RESECTOCOPE;  Surgeon: Marvene Staff, MD;  Location: [facility];  Service: Gynecology;  Laterality: N/A;  . LEFT HEART CATH AND CORONARY ANGIOGRAPHY N/A 02/26/2020   Procedure: LEFT HEART CATH AND CORONARY ANGIOGRAPHY;  Surgeon: Leonie Man, MD;  Location: [facility];  Service: Cardiovascular;  Laterality: N/A;  . TONSILLECTOMY    . TRIGGER FINGER RELEASE     left thumb  . TUBAL LIGATION      Allergies  Allergies  Allergen Reactions  . Nabumetone Rash  . Penicillins Rash    Did it involve swelling of the face/tongue/throat, SOB, or low BP? Yes Did it involve sudden or  severe rash/hives, skin peeling, or any reaction on the inside of your mouth or nose? No Did you need to seek medical attention at a hospital or doctor's office? No When did it last happen? >10 years ago If all above answers are "NO", may proceed with cephalosporin use.     History of Present Illness    [name] has a PMH of coronary artery disease, HTN, diabetes, hyperlipidemia, and mitral valve prolapse. She has a long history of chest pain dating back to childhood. She also has a long history of mitral valve prolapse. She has previously taken antibiotics prior to going to the dentist and developed a rash. She was subsequently told that she no longer needs to take antibiotics.She has had a history of chest discomfort when she lays down or with bending over. This was believed to be related to stress. She has also noticed chest discomfort with stressful work situations. 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 She does not do formal exercise outside of work. She underwent a coronary CTA 01/2020 that showed 25-49% ostial LAD lesion, a greater than 70% lesion in the ostial D1. Her FFR was also abnormal in this area. She underwent cardiac catheterization on 02/26/2020 which showed a 95% second diagonal lesion, a 95% proximal circumflex to mid circumflex lesion, and 35% mid LAD stenosis. She received PCI and DES x2. Her EF at  that time was normal with normal LVEDP. Her simvastatin 10 mg was switched to rosuvastatin 40 mg post-cath.  She presentedto the clinic 3/15/2021for follow-up and statedshe feltwell. Has had no chest pain since her stents were placed. She statedshe waseating a low-sodium heart healthy diet and hadbeen increasing her physical activity slowly. 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 She statedshe had one episode of dizziness that happened while she was seated.  She hadbeen compliant with her medications, hadnot noticed any side effects and didnot have any questions about them.  She contacted the cardiology clinic 03/16/2020 and stated that she was having symptoms of chest pressure while working. She was prescribed metoprolol tartrate 12.5 mg daily and omeprazole 40 mg daily. She was contacted 03/23/2020 and stated that she was still having pressure type sensations in her chest. Her metoprolol had helped for a few days and then her discomfort returned.  She presented to the clinic 03/29/20 for follow-up evaluation and statedshe did not experience sustained relief from metoprolol or Prilosec and stopped taking the medications after a few days. She stated that the metoprolol caused her to be fatigued and caused her arms to feel heavy with the lifting work that she does. When asked about her chest pressure she stated that she notices the pressure when she is active at work lifting and steaming close. She did experience some left lateral discomfort when she was not active at home cooking or watching television. She was also unsure how to use her USB cable with her blood pressure machine we gave her. All questions were answered and demonstration provided. I  started amlodipine 5 mg, had her continue taking her blood pressure, and had her return in 2 weeks.  She presented to the clinic 04/18/2020 and stated she did not like the way that the amlodipine made her feel.  She felt the same way on the metoprolol.  She indicated that they both gave her a mild headache and caused some fatigue.  She stopped taking the amlodipine.  She continued to have chest discomfort with increased physical activity and noted some nonexertional left-sided flank discomfort at rest.  I prescribed carvedilol 3.125 and had her follow-up with Dr. Oval Linsey as scheduled.  She had received her first COVID-19, Carefree vaccination and was scheduled to have her second vaccination at the beginning  of May.  I  had her continue her blood pressure log and bring it to her next appointment.  She is seen by Dr. Oval Linsey on 05/02/2020.  During that time she reported that she had significantly less chest discomfort after being switched to carvedilol.  She continued to struggle with sleep.  She was excited to start cardiac rehab and was working on [employer].  She was following a heart healthy diet.  She denied lower extremity edema, orthopnea, PND, chest pain, and her breathing was stable.  She presents the clinic today for follow-up evaluation states she has been working more at her job.  They have been working 9-hour days Saturdays as well.  She reports is very physically active and with no sitting and includes lifting lighter objects and frequent bending.  She continues to notice occasional episodes of extended dull chest ache.  She reports that this is not like the pain she had prior to her stenting and dissipates without intervention.  She denies exertional chest discomfort.  She states that her Nexletol was stopped by her insurance.  We have received  prior authorization will restart this medication.  I will have her continue her increase physical activity, give her the salty 6 diet sheet, stop her Plavix, and have her follow-up in 6 months.  Today shedenies shortness of breath, lower extremity edema, fatigue, palpitations, melena, hematuria, hemoptysis, diaphoresis, weakness, presyncope, syncope, orthopnea, and PND.  Home Medications    Prior to Admission medications   Medication Sig Start Date End Date Taking? Authorizing Provider  aspirin EC 81 MG tablet Take 1 tablet (81 mg total) by mouth daily. 02/16/20   Skeet Latch, MD  benzonatate (TESSALON PERLES) 100 MG capsule Take 1 capsule (100 mg total) by mouth every 6 (six) hours as needed. 12/26/20 12/26/21  [name], FNP  carvedilol (COREG) 3.125 MG tablet TAKE 1 TABLET(3.125 MG) BY MOUTH TWICE DAILY 07/28/20   Deberah Pelton, NP   clopidogrel (PLAVIX) 75 MG tablet TAKE 1 TABLET BY MOUTH  DAILY WITH BREAKFAST 01/09/21   [name], FNP  EDARBYCLOR 40-12.5 MG TABS TAKE 1 TABLET BY MOUTH  DAILY 10/27/20   [name], FNP  JANUVIA 25 MG tablet TAKE 1 TABLET BY MOUTH  DAILY 11/21/20   [name], FNP  metFORMIN (GLUCOPHAGE) 500 MG tablet TAKE 1 TABLET BY MOUTH  TWICE DAILY WITH A MEAL 01/09/21   [name], FNP  NEXLETOL 180 MG TABS TAKE 1 TABLET BY MOUTH  DAILY 01/13/21   Rodriguez-Guzman, Raquel, RPH-CPP  rosuvastatin (CRESTOR) 40 MG tablet TAKE 1 TABLET BY MOUTH  DAILY AT 6 PM 01/09/21   [name], FNP    Family History    Family History  Problem Relation Age of Onset  . Stroke Mother   . Heart attack Father   . Breast cancer Sister   . Heart attack Brother   . Ovarian cancer Sister   . Lymphoma Sister   . Heart attack Brother   . Colon cancer Neg Hx   . Colon polyps Neg Hx   . Esophageal cancer Neg Hx   . Rectal cancer Neg Hx   . Stomach cancer Neg Hx    She indicated that her mother is deceased. She indicated that her father is deceased. She indicated that four of her five sisters are alive. She indicated that five of her six brothers are alive. She indicated that her maternal grandmother is deceased. She indicated that her maternal grandfather is deceased. She indicated that her paternal grandmother is deceased. She indicated that her paternal grandfather is deceased. She indicated that both of her sons are alive. She indicated that the status of her neg hx is unknown.  Social History    Social History   Socioeconomic History  . Marital status: Single    Spouse name: Not on file  . Number of children: Not on file  . Years of education: 54  . Highest education level: Not on file  Occupational History  . Not on file  Tobacco Use  . Smoking status: Never Smoker  . Smokeless tobacco: Never Used  Substance and Sexual Activity  . Alcohol use: No  . Drug use: No  . Sexual activity: Not on  file  Other Topics Concern  . Not on file  Social History Narrative  . Not on file   Social Determinants of Health   Financial Resource Strain: Not on file  Food Insecurity: Not on file  Transportation Needs: Not on file  Physical Activity: Not on file  Stress: Not on file  Social Connections: Not on file  Intimate  Partner Violence: Not on file     Review of Systems    General:  No chills, fever, night sweats or weight changes.  Cardiovascular:  No chest pain, dyspnea on exertion, edema, orthopnea, palpitations, paroxysmal nocturnal dyspnea. Dermatological: No rash, lesions/masses Respiratory: No cough, dyspnea Urologic: No hematuria, dysuria Abdominal:   No nausea, vomiting, diarrhea, bright red blood per rectum, melena, or hematemesis Neurologic:  No visual changes, wkns, changes in mental status. All other systems reviewed and are otherwise negative except as noted above.  Physical Exam    VS:  BP 114/76   Pulse 77   Ht 5\' 3"  (1.6 m)   Wt 171 lb (77.6 kg)   LMP 09/30/2014   SpO2 97%   BMI 30.29 kg/m  , BMI Body mass index is 30.29 kg/m. GEN: Well nourished, well developed, in no acute distress. HEENT: normal. Neck: Supple, no JVD, carotid bruits, or masses. Cardiac: RRR, no murmurs, rubs, or gallops. No clubbing, cyanosis, edema.  Radials/DP/PT 2+ and equal bilaterally.  Respiratory:  Respirations regular and unlabored, clear to auscultation bilaterally. GI: Soft, nontender, nondistended, BS + x 4. MS: no deformity or atrophy. Skin: warm and dry, no rash. Neuro:  Strength and sensation are intact. Psych: Normal affect.  Accessory Clinical Findings    Recent Labs: 03/29/2021: ALT 32; BUN 21; Creatinine, Ser 0.82; Hemoglobin 11.7; Platelets 386; Potassium 4.2; Sodium 138   Recent Lipid Panel    Component Value Date/Time   CHOL 134 03/29/2021 1615   TRIG 78 03/29/2021 1615   HDL 49 03/29/2021 1615   CHOLHDL 2.7 03/29/2021 1615   LDLCALC 70 03/29/2021 1615     ECG personally reviewed by me today-normal sinus rhythm no ST or T wave deviation 77 bpm- No acute changes  EKG 03/07/2020 sinus tachycardia 103 bpm no ST or T wave deviation.-  EKG 02/29/2020 Normal sinus rhythm no ST or T wave deviation 86 bpm  Echocardiogram 01/20/2020 IMPRESSIONS    1. Left ventricular ejection fraction, by visual estimation, is 60 to  65%. The left ventricle has normal function. There is no left ventricular  hypertrophy.  2. The left ventricle has no regional wall motion abnormalities.  3. Global right ventricle has normal systolic function.The right  ventricular size is normal. No increase in right ventricular wall  thickness.  4. Left atrial size was normal.  5. Right atrial size was normal.  6. The mitral valve is normal in structure. Trivial mitral valve  regurgitation. No evidence of mitral stenosis.  7. Prolapse not appreciated.  8. The tricuspid valve is normal in structure.  9. The tricuspid valve is normal in structure. Tricuspid valve  regurgitation is trivial.  10. The aortic valve is normal in structure. Aortic valve regurgitation is  not visualized. No evidence of aortic valve sclerosis or stenosis.  11. The pulmonic valve was normal in structure. Pulmonic valve  regurgitation is trivial.  12. Normal pulmonary artery systolic pressure.  13. The inferior vena cava is normal in size with greater than 50%  respiratory variability, suggesting right atrial pressure of 3 mmHg.  Cardiac catheterization 02/26/2020   LESION #1: 2nd Diag lesion is 95% stenosed.  A drug-eluting stent was successfully placed using a STENT RESOLUTE ONYX 2.0X8. Stable post dilation from 2.2 to 2.3 mm  Post intervention, there is a 0% residual stenosis.  --------------  LESION #2: Prox Cx to Mid Cx (OM 2) lesion is 95% stenosed. This lesion was not assessed by CT FFR, but is  clearly significant.  A drug-eluting stent was successfully placed using a  STENT RESOLUTE ONYX 2.0X12. Postdilated to 2.2 mm  Post intervention, there is a 0% residual stenosis.  --------------  Mid LAD lesion is 35% stenosed. Dist LAD lesion is 40% stenosed.  Prox RCA to Mid RCA lesion is 45% stenosed. Dist RCA lesion is 45% stenosed. -By CT FFR, summation of these 2 lesions was not visualized significant. (0.81)  SUMMARY  Severe two-vessel disease involving proximal 2nd Diag and LCx-OM 2 with moderate disease in the mid and distal RCA as well as distal LAD.  Successful two-vessel PCI: 2nd Diag 95%-0% w/ Resolute Onyx DES 2.0 mm x 8 mm (2.2-2.3 mm @ post-dilation); LCx-OM1 95%-0% w/ Resolute Onyx DES 2.0 mm x 12 mm (2.2 mm @ post-dilation).  Documented normal EF by Echo; normal LVEDP   RECOMMENDATIONS  Patient should be stable for same-day discharge given to uneventful PCI's. She will follow-up with Dr. Oval Linsey or APP  Per Dr. Doy Mince note, I have switched from simvastatin 10 mg to rosuvastatin 40 mg. Do not restart Metformin for 48 hours post cath; restart ARB tomorrow AM  Diagnostic Dominance: Right  Intervention     Assessment & Plan   1. Coronary artery disease- dull ache over his left pectoral muscle.   She reports this is occasional lasts for several hours and then dissipates on its own.  Very low suspicion for cardiac involvement. CTA 01/2020 that showed 25-49% ostial LAD lesion, a greater than 70% lesion in the ostial D1. Her FFR was also abnormal in this area. She underwent cardiac catheterization on 02/26/2020 which showed a 95% second diagonal lesion, a 95% proximal circumflex to mid circumflex lesion, and 35% mid LAD stenosis. She received PCI and DES x2. Her echocardiogram was normal at that time. Continue aspirin, nitroglycerin, rosuvastatin, nexlotol Heart healthy low-sodium diet-salty 6 given Increase physical activity as tolerated Stop Plavix   Atypical chest pain-reports occasional dull chest ache that was present for  hours and dissipates without intervention. Did not toleratemetoprolol tartrate or amlodipine Continue carvedilol Continue heart healthy low-sodium diet-salty 6 given Increase physical activity as tolerated  Essential hypertension-BP today 114/76.  Well-controlled at home Continue azilsartan-chlorthalidone 40-12.5 mg daily, carvedilol 3.125 mg twice daily Heart healthy low-sodium diet-salty 6 given Increase physical activity as tolerated  Hyperlipidemia-03/29/2021: Cholesterol, Total 134; HDL 49; LDL Chol Calc (NIH) 70; Triglycerides 78 Continue rosuvastatin, Nexletol Per healthy low-sodium high-fiber diet Increase physical activity as tolerated  Disposition: Follow-up with Dr. Oval Linsey in  6 months.   Jossie Ng. Jesusita Jocelyn NP-C    04/03/2021, 10:43 AM [place] [address] Office [phone] Fax [phone]  Notice: This dictation was prepared with Dragon dictation along with smaller phrase technology. Any transcriptional errors that result from this process are unintentional and may not be corrected upon review.  I spent 15 minutes examining this patient, reviewing medications, and using patient centered shared decision making involving her cardiac care.  Prior to her visit I spent greater than 20 minutes reviewing her past medical history,  medications, and prior cardiac tests.

## 2021-04-03 ENCOUNTER — Other Ambulatory Visit: Payer: Self-pay

## 2021-04-03 ENCOUNTER — Ambulatory Visit (INDEPENDENT_AMBULATORY_CARE_PROVIDER_SITE_OTHER): Payer: 59 | Admitting: General Practice

## 2021-04-03 ENCOUNTER — Encounter: Payer: Self-pay | Admitting: General Practice

## 2021-04-03 VITALS — BP 114/76 | HR 77 | Ht 63.0 in | Wt 171.0 lb

## 2021-04-03 DIAGNOSIS — R0789 Other chest pain: Secondary | ICD-10-CM

## 2021-04-03 DIAGNOSIS — I10 Essential (primary) hypertension: Secondary | ICD-10-CM | POA: Diagnosis not present

## 2021-04-03 DIAGNOSIS — I25119 Atherosclerotic heart disease of native coronary artery with unspecified angina pectoris: Secondary | ICD-10-CM | POA: Diagnosis not present

## 2021-04-03 DIAGNOSIS — E7800 Pure hypercholesterolemia, unspecified: Secondary | ICD-10-CM

## 2021-04-03 NOTE — Patient Instructions (Addendum)
Medication Instructions:  START NEXLETOL-PICK UP AT [place] 04-04-21  CONTINUE ASPIRIN 81MG  DAILY  STOP PLAVIX *If you need a refill on your cardiac medications before your next appointment, please call your pharmacy*  Lab Work:   Testing/Procedures:  NONE    NONE  Special Instructions PLEASE READ AND FOLLOW SLEEP TIPS-ATTACHED  PLEASE READ AND FOLLOW SALTY 6-ATTACHED-1,800mg  daily  Follow-Up: Your next appointment:  6 month(s) In Person with Skeet Latch, MD OR IF UNAVAILABLE [place], FNP-C   Please call our office 2 months in advance to schedule this appointment   At [facility], you and your health needs are our priority.  As part of our continuing mission to provide you with exceptional heart care, we have created designated Provider Care Teams.  These Care Teams include your primary Cardiologist (physician) and Advanced Practice Providers (APPs -  Physician Assistants and Nurse Practitioners) who all work together to provide you with the care you need, when you need it.  We recommend signing up for the patient portal called "MyChart".  Sign up information is provided on this After Visit Summary.  MyChart is used to connect with patients for Virtual Visits (Telemedicine).  Patients are able to view lab/test results, encounter notes, upcoming appointments, etc.  Non-urgent messages can be sent to your provider as well.   To learn more about what you can do with MyChart, go to [email or web address].              6 SALTY THINGS TO AVOID     1,800MG  DAILY     Sleep hygiene tips Quality sleep is important for your mental and physical health. It also improves your quality of life. Quality sleep means you:  Are asleep for most of the time you are in bed.  Fall asleep within 30 minutes.  Wake up no more than once a night.  Are awake for no longer than 20 minutes if you do wake up during the night. Most adults need 7-8 hours of quality sleep each  night. How can poor sleep affect me? If you do not get enough quality sleep, you may have:  Mood swings.  Daytime sleepiness.  Confusion.  Decreased reaction time.  Sleep disorders, such as insomnia and sleep apnea.  Difficulty with: ? Solving problems. ? Coping with stress. ? Paying attention. These issues may affect your performance and productivity at work, school, and at home. Lack of sleep may also put you at higher risk for accidents, suicide, and risky behaviors. If you do not get quality sleep you may also be at higher risk for several health problems, including:  Infections.  Type 2 diabetes.  Heart disease.  High blood pressure.  Obesity.  Worsening of long-term conditions, like arthritis, kidney disease, depression, Parkinson's disease, and epilepsy. What actions can I take to get more quality sleep?  Stick to a sleep schedule. Go to sleep and wake up at about the same time each day. Do not try to sleep less on weekdays and make up for lost sleep on weekends. This does not work.  Try to get about 30 minutes of exercise on most days. Do not exercise 2-3 hours before going to bed.  Limit naps during the day to 30 minutes or less.  Do not use any products that contain nicotine or tobacco, such as cigarettes or e-cigarettes. If you need help quitting, ask your health care provider.  Do not drink caffeinated beverages for at least 8 hours before going to bed.  Coffee, tea, and some sodas contain caffeine.  Do not drink alcohol close to bedtime.  Do not eat large meals close to bedtime.  Do not take naps in the late afternoon.  Try to get at least 30 minutes of sunlight every day. Morning sunlight is best.  Make time to relax before bed. Reading, listening to music, or taking a hot bath promotes quality sleep.  Make your bedroom a place that promotes quality sleep. Keep your bedroom dark, quiet, and at a comfortable room temperature. Make sure your bed is  comfortable. Take out sleep distractions like TV, a computer, smartphone, and bright lights.  If you are lying awake in bed for longer than 20 minutes, get up and do a relaxing activity until you feel sleepy.  Work with your health care provider to treat medical conditions that may affect sleeping, such as: ? Nasal obstruction. ? Snoring. ? Sleep apnea and other sleep disorders.  Talk to your health care provider if you think any of your prescription medicines may cause you to have difficulty falling or staying asleep.  If you have sleep problems, talk with a sleep consultant. If you think you have a sleep disorder, talk with your health care provider about getting evaluated by a specialist.      Where to find more information  [place] website: https://sleepfoundation.org  National Heart, Lung, and [place] ([place]): http://www.saunders.info/.pdf  Centers for Disease Control and Prevention (CDC): LearningDermatology.pl Contact a health care provider if you:  Have trouble getting to sleep or staying asleep.  Often wake up very early in the morning and cannot get back to sleep.  Have daytime sleepiness.  Have daytime sleep attacks of suddenly falling asleep and sudden muscle weakness (narcolepsy).  Have a tingling sensation in your legs with a strong urge to move your legs (restless legs syndrome).  Stop breathing briefly during sleep (sleep apnea).  Think you have a sleep disorder or are taking a medicine that is affecting your quality of sleep. Summary  Most adults need 7-8 hours of quality sleep each night.  Getting enough quality sleep is an important part of health and well-being.  Make your bedroom a place that promotes quality sleep and avoid things that may cause you to have poor sleep, such as alcohol, caffeine, smoking, and large meals.  Talk to your health care provider if you have trouble falling asleep  or staying asleep. This information is not intended to replace advice given to you by your health care provider. Make sure you discuss any questions you have with your health care provider. Document Revised: 03/19/2018 Document Reviewed: 03/19/2018 Elsevier Patient Education  2021 [employer].

## 2021-07-27 ENCOUNTER — Other Ambulatory Visit: Payer: Self-pay

## 2021-07-27 ENCOUNTER — Ambulatory Visit (INDEPENDENT_AMBULATORY_CARE_PROVIDER_SITE_OTHER): Payer: 59 | Admitting: Podiatry

## 2021-07-27 ENCOUNTER — Encounter: Payer: Self-pay | Admitting: Podiatry

## 2021-07-27 DIAGNOSIS — G4730 Sleep apnea, unspecified: Secondary | ICD-10-CM | POA: Insufficient documentation

## 2021-07-27 DIAGNOSIS — L603 Nail dystrophy: Secondary | ICD-10-CM | POA: Diagnosis not present

## 2021-07-27 MED ORDER — NEOMYCIN-POLYMYXIN-HC 3.5-10000-1 OT SUSP
OTIC | 0 refills | Status: DC
Start: 2021-07-27 — End: 2023-02-14

## 2021-07-27 NOTE — Patient Instructions (Signed)

## 2021-07-31 NOTE — Progress Notes (Signed)
  Subjective:  Patient ID: Mary Holt, female    DOB: [birth year],  MRN: [id number]  Chief Complaint  Patient presents with   Nail Problem     (np) bil great toe pain;requesting removal   Diabetes    A1C  7.0    60 y.o. female presents with the above complaint. History confirmed with patient.  Both great toenails are very thick painful and she would like to have them removed  Objective:  Physical Exam: warm, good capillary refill, no trophic changes or ulcerative lesions, normal DP and PT pulses, and normal sensory exam.  Severely dystrophic and onychogryphosis of the bilateral hallux nail Assessment:   1. Nail dystrophy      Plan:  Patient was evaluated and treated and all questions answered.    Dystrophic Nail, bilaterally -Patient elects to proceed with minor surgery to remove toenail today. Consent reviewed and signed by patient. -Both hallux nail excised. See procedure note. -Educated on post-procedure care including soaking. Written instructions provided and reviewed. -Patient to follow up in 2 weeks for nail check.  Procedure: Excision of Toenail Location: Bilateral 1st toe  nail . Anesthesia: Lidocaine 1% plain; 1.5 mL and Marcaine 0.5% plain; 1.5 mL, digital block. Skin Prep: Betadine. Dressing: Silvadene; telfa; dry, sterile, compression dressing. Technique: Following skin prep, the toe was exsanguinated and a tourniquet was secured at the base of the toe. The affected nail was freed, and excised. Chemical matrixectomy was then performed with phenol and irrigated out with alcohol. The tourniquet was then removed and sterile dressing applied. Disposition: Patient tolerated procedure well. Patient to return in 2 weeks for follow-up.   Return in about 2 weeks (around 08/10/2021) for bilateral nail check.

## 2021-08-01 ENCOUNTER — Other Ambulatory Visit: Payer: Self-pay

## 2021-08-01 ENCOUNTER — Ambulatory Visit (INDEPENDENT_AMBULATORY_CARE_PROVIDER_SITE_OTHER): Payer: 59 | Admitting: Nurse Practitioner

## 2021-08-01 ENCOUNTER — Encounter: Payer: Self-pay | Admitting: Nurse Practitioner

## 2021-08-01 VITALS — BP 118/76 | HR 85 | Temp 98.4°F | Ht 63.0 in | Wt 178.8 lb

## 2021-08-01 DIAGNOSIS — Z6831 Body mass index (BMI) 31.0-31.9, adult: Secondary | ICD-10-CM

## 2021-08-01 DIAGNOSIS — I10 Essential (primary) hypertension: Secondary | ICD-10-CM | POA: Diagnosis not present

## 2021-08-01 DIAGNOSIS — E7800 Pure hypercholesterolemia, unspecified: Secondary | ICD-10-CM | POA: Diagnosis not present

## 2021-08-01 DIAGNOSIS — Z23 Encounter for immunization: Secondary | ICD-10-CM | POA: Diagnosis not present

## 2021-08-01 DIAGNOSIS — E119 Type 2 diabetes mellitus without complications: Secondary | ICD-10-CM | POA: Diagnosis not present

## 2021-08-01 MED ORDER — SHINGRIX 50 MCG/0.5ML IM SUSR: 0.5000 mL | Freq: Once | INTRAMUSCULAR | 0 refills | Status: AC

## 2021-08-01 MED ORDER — JANUMET XR 50-500 MG PO TB24: 1.0000 | ORAL_TABLET | Freq: Every day | ORAL | 1 refills | Status: DC

## 2021-08-01 NOTE — Progress Notes (Signed)
I,Katawbba Wiggins,acting as a Education administrator for [employer], FNP.,have documented all relevant documentation on the behalf of [name], FNP,as directed by  [name], FNP while in the presence of [name], [place].  This visit occurred during the SARS-CoV-2 public health emergency.  Safety protocols were in place, including screening questions prior to the visit, additional usage of staff PPE, and extensive cleaning of exam room while observing appropriate contact time as indicated for disinfecting solutions.  Subjective:     Patient ID: Mary Holt , female    DOB: 1961-07-16 , 60 y.o.   MRN: [id number]   Chief Complaint  Patient presents with   Hypertension   Obesity    HPI  The patient is here today for a follow-up for diabetes and blood pressure.  She has recently had 2 toenails removed from her left foot last week.   Wt Readings from Last 3 Encounters: 08/01/21 : 178 lb 12.8 oz (81.1 kg) 04/03/21 : 171 lb (77.6 kg) 03/29/21 : 171 lb 12.8 oz (77.9 kg)    Hypertension This is a chronic problem. The current episode started more than 1 year ago. The problem has been gradually improving since onset. The problem is controlled. Pertinent negatives include no anxiety, chest pain, headaches or palpitations. There are no associated agents to hypertension. Risk factors for coronary artery disease include dyslipidemia, sedentary lifestyle and obesity. Past treatments include angiotensin blockers. There are no compliance problems.  There is no history of angina. There is no history of chronic renal disease.    Past Medical History:  Diagnosis Date   Angina pectoris ([place]) 02/22/2020   CAD in native artery 02/22/2020   Chest pain of uncertain etiology 5/95/6387   Diabetes mellitus without complication (HCC)    Heart murmur    asymptomatic   Hyperlipidemia    Hypertension    Kidney stones    Kidney stones    several times, last  one 2014   Mitral valve prolapse    MVP (mitral valve  prolapse)    Pure hypercholesterolemia 05/02/2020   Snoring 01/06/2020     Family History  Problem Relation Age of Onset   Stroke Mother    Heart attack Father    Breast cancer Sister    Heart attack Brother    Ovarian cancer Sister    Lymphoma Sister    Heart attack Brother    Colon cancer Neg Hx    Colon polyps Neg Hx    Esophageal cancer Neg Hx    Rectal cancer Neg Hx    Stomach cancer Neg Hx      Current Outpatient Medications:    aspirin EC 81 MG tablet, Take 1 tablet (81 mg total) by mouth daily., Disp: 90 tablet, Rfl: 3   EDARBYCLOR 40-12.5 MG TABS, TAKE 1 TABLET BY MOUTH  DAILY, Disp: 90 tablet, Rfl: 3   neomycin-polymyxin-hydrocortisone (CORTISPORIN) 3.5-10000-1 OTIC suspension, Apply 1-2 drops daily after soaking and cover with bandaid, Disp: 10 mL, Rfl: 0   NEXLETOL 180 MG TABS, Take 1 tablet by mouth daily., Disp: , Rfl:    rosuvastatin (CRESTOR) 40 MG tablet, TAKE 1 TABLET BY MOUTH  DAILY AT 6 PM, Disp: 90 tablet, Rfl: 3   SitaGLIPtin-MetFORMIN HCl (JANUMET XR) 50-500 MG TB24, Take 1 tablet by mouth daily., Disp: 90 tablet, Rfl: 1   TRI-LUMA 0.01-4-0.05 % CREA, SMARTSIG:1 Sparingly Topical Every Evening, Disp: , Rfl:    Allergies  Allergen Reactions   Nabumetone Rash   Penicillins  Rash    Did it involve swelling of the face/tongue/throat, SOB, or low BP? Yes Did it involve sudden or severe rash/hives, skin peeling, or any reaction on the inside of your mouth or nose? No Did you need to seek medical attention at a hospital or doctor's office? No When did it last happen? >10 years ago If all above answers are "NO", may proceed with cephalosporin use.      Review of Systems  Constitutional: Negative.   Respiratory: Negative.    Cardiovascular: Negative.  Negative for chest pain and palpitations.  Gastrointestinal: Negative.   Neurological:  Negative for headaches.  Psychiatric/Behavioral: Negative.    All other systems reviewed and are negative.   Today's  Vitals   08/01/21 1011  BP: 118/76  Pulse: 85  Temp: 98.4 F (36.9 C)  TempSrc: Oral  Weight: 178 lb 12.8 oz (81.1 kg)  Height: _0  (1.6 m)   Body mass index is 31.67 kg/m.  Wt Readings from Last 3 Encounters:  08/01/21 178 lb 12.8 oz (81.1 kg)  04/03/21 171 lb (77.6 kg)  03/29/21 171 lb 12.8 oz (77.9 kg)    BP Readings from Last 3 Encounters:  08/01/21 118/76  04/03/21 114/76  03/29/21 120/72    Objective:  Physical Exam Vitals reviewed.  Constitutional:      General: She is not in acute distress.    Appearance: Normal appearance. She is well-developed. She is obese.  HENT:     Right Ear: Hearing normal.     Left Ear: Hearing normal.  Eyes:     General: Lids are normal.     Funduscopic exam:    Right eye: No papilledema.        Left eye: No papilledema.  Neck:     Thyroid: No thyroid mass.     Vascular: No carotid bruit.  Cardiovascular:     Rate and Rhythm: Normal rate and regular rhythm.     Pulses: Normal pulses.     Heart sounds: Normal heart sounds. No murmur heard. Pulmonary:     Effort: Pulmonary effort is normal. No respiratory distress.     Breath sounds: Normal breath sounds. No wheezing.  Chest:  Breasts:    Right: Normal. No mass, tenderness or supraclavicular adenopathy.     Left: Normal. No mass, tenderness or supraclavicular adenopathy.  Musculoskeletal:     Cervical back: Full passive range of motion without pain, normal range of motion and neck supple.  Lymphadenopathy:     Upper Body:     Right upper body: No supraclavicular adenopathy.     Left upper body: No supraclavicular adenopathy.  Skin:    General: Skin is warm and dry.     Capillary Refill: Capillary refill takes less than 2 seconds.  Neurological:     General: No focal deficit present.     Mental Status: She is alert and oriented to person, place, and time.     Cranial Nerves: No cranial nerve deficit.     Sensory: No sensory deficit.     Motor: No weakness.  Psychiatric:         Mood and Affect: Mood normal.        Behavior: Behavior normal.        Thought Content: Thought content normal.        Judgment: Judgment normal.        Assessment And Plan:     1. Type 2 diabetes mellitus without complication, without long-term current use  of insulin ([place]) Comments: Improving at last visit, will change to Janumet to help with financial burden and pill burden - BMP8+EGFR - SitaGLIPtin-MetFORMIN HCl (JANUMET XR) 50-500 MG TB24; Take 1 tablet by mouth daily.  Dispense: 90 tablet; Refill: 1  2. Essential hypertension Comments: Well controlled Continue current medication  3. Pure hypercholesterolemia Comments: Tolerating medications well  4. Encounter for immunization Comments: Rx for Shingrix sent to pharmacy at patient request - Zoster Vaccine Adjuvanted [facility]) injection; Inject 0.5 mLs into the muscle once for 1 dose.  Dispense: 0.5 mL; Refill: 0  5. BMI 31.0-31.9,adult Comments: Encouraged to increase physical activity to 150 minutes a week and eat healthy diet.  She is encouraged to strive for BMI less than 30 to decrease cardiac risk. Advised to aim for at least 150 minutes of exercise per week.    Patient was given opportunity to ask questions. Patient verbalized understanding of the plan and was able to repeat key elements of the plan. All questions were answered to their satisfaction.  [name], FNP   I, [name], FNP, have reviewed all documentation for this visit. The documentation on 08/02/21 for the exam, diagnosis, procedures, and orders are all accurate and complete.   IF YOU HAVE BEEN REFERRED TO A SPECIALIST, IT MAY TAKE 1-2 WEEKS TO SCHEDULE/PROCESS THE REFERRAL. IF YOU HAVE NOT HEARD FROM US/SPECIALIST IN TWO WEEKS, PLEASE GIVE [place] A CALL AT [phone] X [phone].   THE PATIENT IS ENCOURAGED TO PRACTICE SOCIAL DISTANCING DUE TO THE COVID-19 PANDEMIC.

## 2021-08-02 LAB — BMP8+EGFR
BUN/Creatinine Ratio: 27 (ref 12–28)
BUN: 25 mg/dL (ref 8–27)
CO2: 20 mmol/L (ref 20–29)
Calcium: 9.8 mg/dL (ref 8.7–10.3)
Chloride: 102 mmol/L (ref 96–106)
Creatinine, Ser: 0.91 mg/dL (ref 0.57–1.00)
Glucose: 123 mg/dL — ABNORMAL HIGH (ref 65–99)
Potassium: 4.1 mmol/L (ref 3.5–5.2)
Sodium: 140 mmol/L (ref 134–144)
eGFR: 72 mL/min/{1.73_m2} (ref >59)

## 2021-08-03 ENCOUNTER — Other Ambulatory Visit: Payer: Self-pay

## 2021-08-03 ENCOUNTER — Ambulatory Visit
Admission: RE | Admit: 2021-08-03 | Discharge: 2021-08-03 | Disposition: A | Discharge status: Home / Self Care | Payer: 59 | Source: Ambulatory Visit | Attending: Nurse Practitioner | Admitting: Nurse Practitioner

## 2021-08-03 DIAGNOSIS — Z1231 Encounter for screening mammogram for malignant neoplasm of breast: Secondary | ICD-10-CM

## 2021-08-11 ENCOUNTER — Encounter: Payer: Self-pay | Admitting: Nurse Practitioner

## 2021-08-14 ENCOUNTER — Encounter: Payer: Self-pay | Admitting: Nurse Practitioner

## 2021-08-15 ENCOUNTER — Ambulatory Visit (INDEPENDENT_AMBULATORY_CARE_PROVIDER_SITE_OTHER): Payer: 59 | Admitting: Podiatry

## 2021-08-15 ENCOUNTER — Other Ambulatory Visit: Payer: Self-pay

## 2021-08-15 DIAGNOSIS — L603 Nail dystrophy: Secondary | ICD-10-CM

## 2021-08-15 NOTE — Progress Notes (Signed)
  Subjective:  Patient ID: LINZE TONTI, female    DOB: January 09, 1961,  MRN: [id number]  Chief Complaint  Patient presents with   Nail Problem       bilat nail check    60 y.o. female returns for follow-up with the above complaint. History confirmed with patient.  Doing very well  Objective:  Physical Exam: warm, good capillary refill, no trophic changes or ulcerative lesions, normal DP and PT pulses, and normal sensory exam.  Bilateral hallux matricectomy's are healing well Assessment:   1. Nail dystrophy      Plan:  Patient was evaluated and treated and all questions answered.    Dystrophic Nail, bilaterally -Healing well she can leave open to air and discontinue soaks and ointment at this point.  Follow-up with me as needed  Return if symptoms worsen or fail to improve.

## 2021-11-07 ENCOUNTER — Other Ambulatory Visit: Payer: Self-pay

## 2021-11-07 ENCOUNTER — Encounter: Payer: Self-pay | Admitting: Nurse Practitioner

## 2021-11-07 ENCOUNTER — Ambulatory Visit (INDEPENDENT_AMBULATORY_CARE_PROVIDER_SITE_OTHER): Payer: 59 | Admitting: Nurse Practitioner

## 2021-11-07 VITALS — BP 120/88 | HR 64 | Temp 98.5°F | Ht 63.0 in | Wt 175.8 lb

## 2021-11-07 DIAGNOSIS — E119 Type 2 diabetes mellitus without complications: Secondary | ICD-10-CM

## 2021-11-07 DIAGNOSIS — Z23 Encounter for immunization: Secondary | ICD-10-CM | POA: Diagnosis not present

## 2021-11-07 DIAGNOSIS — I10 Essential (primary) hypertension: Secondary | ICD-10-CM | POA: Diagnosis not present

## 2021-11-07 DIAGNOSIS — Z6831 Body mass index (BMI) 31.0-31.9, adult: Secondary | ICD-10-CM

## 2021-11-07 DIAGNOSIS — E6609 Other obesity due to excess calories: Secondary | ICD-10-CM | POA: Diagnosis not present

## 2021-11-07 DIAGNOSIS — E1169 Type 2 diabetes mellitus with other specified complication: Secondary | ICD-10-CM

## 2021-11-07 LAB — HEMOGLOBIN A1C
Est. average glucose Bld gHb Est-mCnc: 163 mg/dL
Hgb A1c MFr Bld: 7.3 % — ABNORMAL HIGH (ref 4.8–5.6)

## 2021-11-07 MED ORDER — METFORMIN HCL ER 500 MG PO TB24: 500.0000 mg | ORAL_TABLET | Freq: Every day | ORAL | 1 refills | Status: DC

## 2021-11-07 MED ORDER — SITAGLIPTIN PHOSPHATE 50 MG PO TABS
50.0000 mg | ORAL_TABLET | Freq: Every day | ORAL | 1 refills | Status: DC
Start: 2021-11-07 — End: 2022-04-05

## 2021-11-07 NOTE — Progress Notes (Signed)
I,Mary Holt,acting as a Education administrator for [employer], FNP.,have documented all relevant documentation on the behalf of [name], FNP,as directed by  [name], FNP while in the presence of [name], [place].   This visit occurred during the SARS-CoV-2 public health emergency.  Safety protocols were in place, including screening questions prior to the visit, additional usage of staff PPE, and extensive cleaning of exam room while observing appropriate contact time as indicated for disinfecting solutions.  Subjective:     Patient ID: Mary Holt , female    DOB: [birth year] , 60 y.o.   MRN: [id number]   Chief Complaint  Patient presents with   Hypertension    HPI  The patient is here today for diabetes. She feels the combination caused her mental fatigue was worse.   Diabetes She presents for her follow-up diabetic visit. Pertinent negatives for hypoglycemia include no headaches. Pertinent negatives for diabetes include no chest pain. Risk factors for coronary artery disease include obesity and sedentary lifestyle. Current diabetic treatment includes oral agent (dual therapy). (Not checking her blood sugar regularly)  Hypertension This is a chronic problem. The current episode started more than 1 year ago. The problem has been gradually improving since onset. The problem is controlled. Pertinent negatives include no anxiety, chest pain, headaches or palpitations. There are no associated agents to hypertension. Risk factors for coronary artery disease include dyslipidemia, sedentary lifestyle and obesity. Past treatments include angiotensin blockers. There are no compliance problems.  There is no history of angina. There is no history of chronic renal disease.    Past Medical History:  Diagnosis Date   Angina pectoris ([place]) 02/22/2020   CAD in native artery 02/22/2020   Chest pain of uncertain etiology 0/09/711   Diabetes mellitus without complication (HCC)    Heart murmur     asymptomatic   Hyperlipidemia    Hypertension    Kidney stones    Kidney stones    several times, last  one 2014   Mitral valve prolapse    MVP (mitral valve prolapse)    Pure hypercholesterolemia 05/02/2020   Snoring 01/06/2020     Family History  Problem Relation Age of Onset   Stroke Mother    Heart attack Father    Breast cancer Sister    Heart attack Brother    Ovarian cancer Sister    Lymphoma Sister    Heart attack Brother    Colon cancer Neg Hx    Colon polyps Neg Hx    Esophageal cancer Neg Hx    Rectal cancer Neg Hx    Stomach cancer Neg Hx      Current Outpatient Medications:    aspirin EC 81 MG tablet, Take 1 tablet (81 mg total) by mouth daily., Disp: 90 tablet, Rfl: 3   metFORMIN (GLUCOPHAGE XR) 500 MG 24 hr tablet, Take 1 tablet (500 mg total) by mouth daily with breakfast., Disp: 90 tablet, Rfl: 1   neomycin-polymyxin-hydrocortisone (CORTISPORIN) 3.5-10000-1 OTIC suspension, Apply 1-2 drops daily after soaking and cover with bandaid, Disp: 10 mL, Rfl: 0   NEXLETOL 180 MG TABS, Take 1 tablet by mouth daily., Disp: , Rfl:    rosuvastatin (CRESTOR) 40 MG tablet, TAKE 1 TABLET BY MOUTH  DAILY AT 6 PM, Disp: 90 tablet, Rfl: 3   sitaGLIPtin (JANUVIA) 50 MG tablet, Take 1 tablet (50 mg total) by mouth daily., Disp: 90 tablet, Rfl: 1   TRI-LUMA 0.01-4-0.05 % CREA, SMARTSIG:1 Sparingly Topical Every Evening, Disp: ,  Rfl:    EDARBYCLOR 40-12.5 MG TABS, TAKE 1 TABLET BY MOUTH  DAILY, Disp: 90 tablet, Rfl: 3   Allergies  Allergen Reactions   Nabumetone Rash   Penicillins Rash    Did it involve swelling of the face/tongue/throat, SOB, or low BP? Yes Did it involve sudden or severe rash/hives, skin peeling, or any reaction on the inside of your mouth or nose? No Did you need to seek medical attention at a hospital or doctor's office? No When did it last happen? >10 years ago If all above answers are "NO", may proceed with cephalosporin use.      Review of Systems   Constitutional: Negative.   Respiratory: Negative.    Cardiovascular: Negative.  Negative for chest pain and palpitations.  Gastrointestinal: Negative.   Neurological:  Negative for headaches.  Psychiatric/Behavioral: Negative.    All other systems reviewed and are negative.   Today's Vitals   11/07/21 1037  BP: 120/88  Pulse: 64  Temp: 98.5 F (36.9 C)  Weight: 175 lb 12.8 oz (79.7 kg)  Height: 5\' 3"  (1.6 m)   Body mass index is 31.14 kg/m.  Wt Readings from Last 3 Encounters:  11/07/21 175 lb 12.8 oz (79.7 kg)  08/01/21 178 lb 12.8 oz (81.1 kg)  04/03/21 171 lb (77.6 kg)    Body mass index is 31.14 kg/m.  Objective:  Physical Exam Vitals reviewed.  Constitutional:      General: She is not in acute distress.    Appearance: Normal appearance. She is well-developed. She is obese.  HENT:     Head: Normocephalic and atraumatic.     Right Ear: Hearing normal.     Left Ear: Hearing normal.     Mouth/Throat:     Comments: = Eyes:     General: Lids are normal.     Pupils: Pupils are equal, round, and reactive to light.     Funduscopic exam:    Right eye: No papilledema.        Left eye: No papilledema.  Neck:     Thyroid: No thyroid mass.     Vascular: No carotid bruit.  Cardiovascular:     Rate and Rhythm: Normal rate and regular rhythm.     Pulses: Normal pulses.     Heart sounds: Normal heart sounds. No murmur heard. Pulmonary:     Effort: Pulmonary effort is normal. No respiratory distress.     Breath sounds: Normal breath sounds. No wheezing.  Chest:     Chest wall: No mass.  Breasts:    Tanner Score is 5.     Right: Normal. No mass or tenderness.     Left: Normal. No mass or tenderness.  Abdominal:     General: There is no distension.     Tenderness: There is no abdominal tenderness.  Musculoskeletal:     Cervical back: Full passive range of motion without pain.     Left lower leg: No edema.  Lymphadenopathy:     Upper Body:     Right upper body:  No supraclavicular, axillary or pectoral adenopathy.     Left upper body: No supraclavicular, axillary or pectoral adenopathy.  Skin:    General: Skin is warm and dry.     Capillary Refill: Capillary refill takes less than 2 seconds.  Neurological:     General: No focal deficit present.     Mental Status: She is alert and oriented to person, place, and time.     Cranial  Nerves: No cranial nerve deficit.     Sensory: No sensory deficit.  Psychiatric:        Mood and Affect: Mood normal.        Behavior: Behavior normal.        Thought Content: Thought content normal.        Judgment: Judgment normal.        Assessment And Plan:     1. Type 2 diabetes mellitus without complication, without long-term current use of insulin (HCC) Comments: Hgba1c was slightly better at last visit, she is advised to make sure she is taking her medications regularly.  - Hemoglobin A1c - sitaGLIPtin (JANUVIA) 50 MG tablet; Take 1 tablet (50 mg total) by mouth daily.  Dispense: 90 tablet; Refill: 1 - metFORMIN (GLUCOPHAGE XR) 500 MG 24 hr tablet; Take 1 tablet (500 mg total) by mouth daily with breakfast.  Dispense: 90 tablet; Refill: 1  2. Essential hypertension Comments: Blood pressure is better controlled.  continue follow up with Cardiology and current medications  3. BMI 31.0-31.9,adult She is encouraged to strive for BMI less than 30 to decrease cardiac risk. Advised to aim for at least 150 minutes of exercise per week.  4. Immunization due Influenza vaccine administered Encouraged to take Tylenol as needed for fever or muscle aches. - Flu Vaccine QUAD 6+ mos PF IM (Fluarix Quad PF)    Patient was given opportunity to ask questions. Patient verbalized understanding of the plan and was able to repeat key elements of the plan. All questions were answered to their satisfaction.  [name], FNP   I, [name], FNP, have reviewed all documentation for this visit. The documentation on 11/07/21  for the exam, diagnosis, procedures, and orders are all accurate and complete.   IF YOU HAVE BEEN REFERRED TO A SPECIALIST, IT MAY TAKE 1-2 WEEKS TO SCHEDULE/PROCESS THE REFERRAL. IF YOU HAVE NOT HEARD FROM US/SPECIALIST IN TWO WEEKS, PLEASE GIVE [place] A CALL AT [phone].   THE PATIENT IS ENCOURAGED TO PRACTICE SOCIAL DISTANCING DUE TO THE COVID-19 PANDEMIC.

## 2021-11-07 NOTE — Patient Instructions (Signed)

## 2021-11-08 LAB — HM PAP SMEAR

## 2021-11-25 ENCOUNTER — Other Ambulatory Visit: Payer: Self-pay | Admitting: Nurse Practitioner

## 2021-11-25 DIAGNOSIS — I10 Essential (primary) hypertension: Secondary | ICD-10-CM

## 2021-12-04 DIAGNOSIS — E119 Type 2 diabetes mellitus without complications: Secondary | ICD-10-CM | POA: Insufficient documentation

## 2021-12-06 NOTE — Progress Notes (Signed)
Cardiology Clinic Note   Patient Name: Mary Holt Date of Encounter: 12/08/2021  Primary Care Provider:  [name], [place] Primary Cardiologist:  Skeet Latch, MD  Patient Profile    Mary Holt 60 year old female presents today for follow-up of her coronary artery disease and essential hypertension.    Past Medical History    Past Medical History:  Diagnosis Date   Angina pectoris ([place]) 02/22/2020   CAD in native artery 02/22/2020   Chest pain of uncertain etiology 7/98/9211   Diabetes mellitus without complication ([place])    Heart murmur    asymptomatic   Hyperlipidemia    Hypertension    Kidney stones    Kidney stones    several times, last  one 2014   Mitral valve prolapse    MVP (mitral valve prolapse)    Pure hypercholesterolemia 05/02/2020   Snoring 01/06/2020   Past Surgical History:  Procedure Laterality Date   BACK SURGERY     x2   BREAST SURGERY     cyst right breast   CARDIAC CATHETERIZATION     CARPAL TUNNEL RELEASE Bilateral    CORONARY STENT INTERVENTION N/A 02/26/2020   Procedure: CORONARY STENT INTERVENTION;  Surgeon: Leonie Man, MD;  Location: [facility];  Service: Cardiovascular;  Laterality: N/A;   DILATATION & CURRETTAGE/HYSTEROSCOPY WITH RESECTOCOPE N/A 08/11/2013   Procedure: DILATATION & CURETTAGE/HYSTEROSCOPY WITH RESECTOCOPE;  Surgeon: Marvene Staff, MD;  Location: [facility];  Service: Gynecology;  Laterality: N/A;   LEFT HEART CATH AND CORONARY ANGIOGRAPHY N/A 02/26/2020   Procedure: LEFT HEART CATH AND CORONARY ANGIOGRAPHY;  Surgeon: Leonie Man, MD;  Location: [facility];  Service: Cardiovascular;  Laterality: N/A;   TONSILLECTOMY     TRIGGER FINGER RELEASE     left thumb   TUBAL LIGATION      Allergies  Allergies  Allergen Reactions   Nabumetone Rash   Penicillins Rash    Did it involve swelling of the face/tongue/throat, SOB, or low BP? Yes Did it involve sudden or severe rash/hives, skin  peeling, or any reaction on the inside of your mouth or nose? No Did you need to seek medical attention at a hospital or doctor's office? No When did it last happen? >10 years ago If all above answers are NO, may proceed with cephalosporin use.     History of Present Illness    [name] has a PMH of coronary artery disease, HTN, diabetes, hyperlipidemia, and mitral valve prolapse.  She has a long history of chest pain dating back to childhood.  She also has a long history of mitral valve prolapse.  She has previously taken antibiotics prior to going to the dentist and developed a rash.  She was subsequently told that she no longer needs to take antibiotics.  She has had a history of chest discomfort when she lays down or with bending over.  This was believed to be related to stress.  She has also noticed chest discomfort with stressful work situations.  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  She does not do formal exercise outside of work.  She underwent a coronary CTA 01/2020 that showed 25-49% ostial LAD lesion, a greater than 70% lesion in the ostial D1.  Her FFR was also abnormal in this area.  She underwent cardiac catheterization on 02/26/2020 which showed a 95% second diagonal lesion, a 95% proximal circumflex to mid circumflex lesion, and  35% mid LAD stenosis.  She received PCI and DES x2.  Her EF at that time was normal with normal LVEDP.  Her simvastatin 10 mg was switched to rosuvastatin 40 mg post-cath.   She presented to the clinic 03/07/2020 for follow-up and stated she felt well.  Has had no chest pain since her stents were placed.  She stated she was eating a low-sodium heart healthy diet and had been increasing her physical activity slowly.  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  She stated she  had one episode of dizziness that happened while she was seated.  She had been compliant  with her medications, had not noticed any side effects and did not have any questions about them.   She contacted the cardiology clinic 03/16/2020 and stated that she was having symptoms of chest pressure while working.  She was prescribed metoprolol tartrate 12.5 mg daily and omeprazole 40 mg daily.  She was contacted 03/23/2020 and stated that she was still having pressure type sensations in her chest.  Her metoprolol had helped for a few days and then her discomfort returned.   She presented to the clinic 03/29/20 for follow-up evaluation and stated she did not experience sustained relief from metoprolol or Prilosec and stopped taking the medications after a few days.  She stated that the metoprolol caused her to be fatigued and caused her arms to feel heavy with the lifting work that she does.  When asked about her chest pressure she stated that she notices the pressure when she is active at work lifting and steaming close.  She did experience some left lateral discomfort when she was not active at home cooking or watching television.  She was also unsure how to use her USB cable with her blood pressure machine we gave her.  All questions were answered and demonstration provided.  I  started amlodipine 5 mg, had her continue taking her blood pressure, and had her return in 2 weeks.   She presented to the clinic 04/18/2020 and stated she did not like the way that the amlodipine made her feel.  She felt the same way on the metoprolol.  She indicated that they both gave her a mild headache and caused some fatigue.  She stopped taking the amlodipine.  She continued to have chest discomfort with increased physical activity and noted some nonexertional left-sided flank discomfort at rest.  I prescribed carvedilol 3.125 and had her follow-up with Dr. Oval Linsey as scheduled.  She had received her first COVID-19, Anthony vaccination and was scheduled to have her second vaccination at the beginning of May.  I  had her  continue her blood pressure log and bring it to her next appointment.   She is seen by Dr. Oval Linsey on 05/02/2020.  During that time she reported that she had significantly less chest discomfort after being switched to carvedilol.  She continued to struggle with sleep.  She was excited to start cardiac rehab and was working on [employer].  She was following a heart healthy diet.  She denied lower extremity edema, orthopnea, PND, chest pain, and her breathing was stable.  She presented to the clinic 04/03/21 for follow-up evaluation stated she had been working more at her job.  They had been working 9-hour days Saturdays as well.  She reported being very physically active, with no sitting and included lifting lighter objects with frequent bending.  She continued to notice occasional episodes of extended dull chest ache.  She reported that  the pain she had prior to her stenting and dissipates without intervention was not similar.  She denied exertional chest discomfort.  She stated that her Nexletol was stopped by her insurance.  We had received prior authorization and restarted the medication.  I will had her continue her increase physical activity, gave her the salty 6 diet sheet, stopped her Plavix, and planned her follow-up in 6 months.  She presents to the clinic today for follow-up evaluation states she continues to notice a dull ache in her chest.  She notices it occasionally in the morning when she wakes up.  She continues to be very physically active walking and at work.  She denies exertional chest discomfort.  We reviewed her previous coronary CTA, angiography, and LDL.  She expressed understanding.  Her symptoms appear to be related to acid reflux.  I will trial her on some Protonix and give her GERD diet information.  We will plan follow-up in 2 to 3 months.  Today she denies shortness of breath, lower extremity edema, fatigue, palpitations, melena, hematuria, hemoptysis, diaphoresis,  weakness, presyncope, syncope, orthopnea, and PND.  Home Medications    Prior to Admission medications   Medication Sig Start Date End Date Taking? Authorizing Provider  aspirin EC 81 MG tablet Take 1 tablet (81 mg total) by mouth daily. 02/16/20   Skeet Latch, MD  benzonatate (TESSALON PERLES) 100 MG capsule Take 1 capsule (100 mg total) by mouth every 6 (six) hours as needed. 12/26/20 12/26/21  [name], FNP  carvedilol (COREG) 3.125 MG tablet TAKE 1 TABLET(3.125 MG) BY MOUTH TWICE DAILY 07/28/20   Deberah Pelton, NP  clopidogrel (PLAVIX) 75 MG tablet TAKE 1 TABLET BY MOUTH  DAILY WITH BREAKFAST 01/09/21   [name], FNP  EDARBYCLOR 40-12.5 MG TABS TAKE 1 TABLET BY MOUTH  DAILY 10/27/20   [name], FNP  JANUVIA 25 MG tablet TAKE 1 TABLET BY MOUTH  DAILY 11/21/20   [name], FNP  metFORMIN (GLUCOPHAGE) 500 MG tablet TAKE 1 TABLET BY MOUTH  TWICE DAILY WITH A MEAL 01/09/21   [name], FNP  NEXLETOL 180 MG TABS TAKE 1 TABLET BY MOUTH  DAILY 01/13/21   Rodriguez-Guzman, Raquel, RPH-CPP  rosuvastatin (CRESTOR) 40 MG tablet TAKE 1 TABLET BY MOUTH  DAILY AT 6 PM 01/09/21   [name], FNP    Family History    Family History  Problem Relation Age of Onset   Stroke Mother    Heart attack Father    Breast cancer Sister    Heart attack Brother    Ovarian cancer Sister    Lymphoma Sister    Heart attack Brother    Colon cancer Neg Hx    Colon polyps Neg Hx    Esophageal cancer Neg Hx    Rectal cancer Neg Hx    Stomach cancer Neg Hx    She indicated that her mother is deceased. She indicated that her father is deceased. She indicated that four of her five sisters are alive. She indicated that five of her six brothers are alive. She indicated that her maternal grandmother is deceased. She indicated that her maternal grandfather is deceased. She indicated that her paternal grandmother is deceased. She indicated that her paternal grandfather is deceased. She indicated that  both of her sons are alive. She indicated that the status of her neg hx is unknown.   Social  History    Social History   Socioeconomic History   Marital status: Single    Spouse name: Not on file   Number of children: Not on file   Years of education: 12   Highest education level: Not on file  Occupational History   Not on file  Tobacco Use   Smoking status: Never   Smokeless tobacco: Never  Substance and Sexual Activity   Alcohol use: No   Drug use: No   Sexual activity: Not on file  Other Topics Concern   Not on file  Social History Narrative   Not on file   Social Determinants of Health   Financial Resource Strain: Not on file  Food Insecurity: Not on file  Transportation Needs: Not on file  Physical Activity: Not on file  Stress: Not on file  Social Connections: Not on file  Intimate Partner Violence: Not on file     Review of Systems    General:  No chills, fever, night sweats or weight changes.  Cardiovascular:  No chest pain, dyspnea on exertion, edema, orthopnea, palpitations, paroxysmal nocturnal dyspnea. Dermatological: No rash, lesions/masses Respiratory: No cough, dyspnea Urologic: No hematuria, dysuria Abdominal:   No nausea, vomiting, diarrhea, bright red blood per rectum, melena, or hematemesis Neurologic:  No visual changes, wkns, changes in mental status. All other systems reviewed and are otherwise negative except as noted above.  Physical Exam    VS:  BP 112/62    Pulse (!) 51    Ht 5\' 3"  (1.6 m)    Wt 177 lb 3.2 oz (80.4 kg)    LMP 09/30/2014    SpO2 95%    BMI 31.39 kg/m  , BMI Body mass index is 31.39 kg/m. GEN: Well nourished, well developed, in no acute distress. HEENT: normal. Neck: Supple, no JVD, carotid bruits, or masses. Cardiac: RRR, no murmurs, rubs, or gallops. No clubbing, cyanosis, edema.  Radials/DP/PT 2+ and equal bilaterally.  Respiratory:  Respirations regular and unlabored, clear to auscultation bilaterally. GI: Soft,  nontender, nondistended, BS + x 4. MS: no deformity or atrophy. Skin: warm and dry, no rash. Neuro:  Strength and sensation are intact. Psych: Normal affect.  Accessory Clinical Findings    Recent Labs: 03/29/2021: ALT 32; Hemoglobin 11.7; Platelets 386 08/01/2021: BUN 25; Creatinine, Ser 0.91; Potassium 4.1; Sodium 140   Recent Lipid Panel    Component Value Date/Time   CHOL 134 03/29/2021 1615   TRIG 78 03/29/2021 1615   HDL 49 03/29/2021 1615   CHOLHDL 2.7 03/29/2021 1615   LDLCALC 70 03/29/2021 1615    ECG personally reviewed by me today-normal sinus rhythm no ST or T wave deviation 81 bpm  EKG 04/03/2021 normal sinus rhythm no ST or T wave deviation 77 bpm- No acute changes  EKG 03/07/2020  sinus tachycardia 103 bpm no ST or T wave deviation.-   EKG 02/29/2020 Normal sinus rhythm no ST or T wave deviation 86 bpm   Echocardiogram 01/20/2020 IMPRESSIONS     1. Left ventricular ejection fraction, by visual estimation, is 60 to  65%. The left ventricle has normal function. There is no left ventricular  hypertrophy.   2. The left ventricle has no regional wall motion abnormalities.   3. Global right ventricle has normal systolic function.The right  ventricular size is normal. No increase in right ventricular wall  thickness.   4. Left atrial size was normal.   5. Right atrial size was normal.   6. The mitral  valve is normal in structure. Trivial mitral valve  regurgitation. No evidence of mitral stenosis.   7. Prolapse not appreciated.   8. The tricuspid valve is normal in structure.   9. The tricuspid valve is normal in structure. Tricuspid valve  regurgitation is trivial.  10. The aortic valve is normal in structure. Aortic valve regurgitation is  not visualized. No evidence of aortic valve sclerosis or stenosis.  11. The pulmonic valve was normal in structure. Pulmonic valve  regurgitation is trivial.  12. Normal pulmonary artery systolic pressure.  13. The inferior  vena cava is normal in size with greater than 50%  respiratory variability, suggesting right atrial pressure of 3 mmHg.   Cardiac catheterization 02/26/2020    LESION #1: 2nd Diag lesion is 95% stenosed. A drug-eluting stent was successfully placed using a STENT RESOLUTE ONYX 2.0X8. Stable post dilation from 2.2 to 2.3 mm Post intervention, there is a 0% residual stenosis. -------------- LESION #2: Prox Cx to Mid Cx (OM 2) lesion is 95% stenosed. This lesion was not assessed by CT FFR, but is clearly significant. A drug-eluting stent was successfully placed using a STENT RESOLUTE ONYX 2.0X12. Postdilated to 2.2 mm Post intervention, there is a 0% residual stenosis. -------------- Mid LAD lesion is 35% stenosed. Dist LAD lesion is 40% stenosed. Prox RCA to Mid RCA lesion is 45% stenosed. Dist RCA lesion is 45% stenosed. -By CT FFR, summation of these 2 lesions was not visualized significant. (0.81)   SUMMARY Severe two-vessel disease involving proximal 2nd Diag and LCx-OM 2 with moderate disease in the mid and distal RCA as well as distal LAD. Successful two-vessel PCI: 2nd Diag 95%-0% w/ Resolute Onyx DES 2.0 mm x 8 mm (2.2-2.3 mm @ post-dilation); LCx-OM1 95%-0% w/ Resolute Onyx DES 2.0 mm x 12 mm (2.2 mm @ post-dilation). Documented normal EF by Echo; normal LVEDP     RECOMMENDATIONS Patient should be stable for same-day discharge given to uneventful PCI's.  She will follow-up with Dr. Oval Linsey or APP Per Dr. Doy Mince note, I have switched from simvastatin 10 mg to rosuvastatin 40 mg. Do not restart Metformin for 48 hours post cath; restart ARB tomorrow AM   Diagnostic Dominance: Right  Intervention       Assessment & Plan   1. Coronary artery disease-continues to have dull ache over his left pectoral muscle.    Continues to have low suspicion for cardiac involvement. CTA 01/2020 that showed 25-49% ostial LAD lesion, a greater than 70% lesion in the ostial D1.  Her FFR was also  abnormal in this area.  She underwent cardiac catheterization on 02/26/2020 which showed a 95% second diagonal lesion, a 95% proximal circumflex to mid circumflex lesion, and 35% mid LAD stenosis.  She received PCI and DES x2.  Had normal echo at that time. Continue aspirin,  nitroglycerin, rosuvastatin, nexlotol Heart healthy low-sodium diet-salty 6 given Increase physical activity as tolerated  GERD-notices dull ache sometimes in the morning.  Denies exertional chest pain.  Appears to be related to reflux versus precordial type pain. GERD diet Start pantoprazole 20 mg daily    Atypical chest pain-stable .  Did not tolerate metoprolol tartrate or amlodipine Continue carvedilol Continue heart healthy low-sodium diet-salty 6 given Increase physical activity as tolerated  Essential hypertension-BP today  112/62.  Well-controlled at home Continue azilsartan-chlorthalidone 40-12.5 mg daily, carvedilol 3.125 mg twice daily Heart healthy low-sodium diet-salty 6 given Increase physical activity as tolerated   Hyperlipidemia-LDL 70 on 03/29/2021  Continue  rosuvastatin, Nexletol Per healthy low-sodium high-fiber diet Increase physical activity as tolerated Repeat fasting lipids and LFTs 4/23  Disposition: Follow-up with Dr. Oval Linsey in  6-9 months.   Jossie Ng. Lakeia Bradshaw NP-C    12/08/2021, 10:19 AM [place] [address] Office [phone] Fax [phone]  Notice: This dictation was prepared with Dragon dictation along with smaller phrase technology. Any transcriptional errors that result from this process are unintentional and may not be corrected upon review.  I spent 13 minutes examining this patient, reviewing medications, and using patient centered shared decision making involving her cardiac care.  Prior to her visit I spent greater than 20 minutes reviewing her past medical history,  medications, and prior cardiac tests.

## 2021-12-08 ENCOUNTER — Ambulatory Visit (INDEPENDENT_AMBULATORY_CARE_PROVIDER_SITE_OTHER): Payer: 59 | Admitting: General Practice

## 2021-12-08 ENCOUNTER — Encounter: Payer: Self-pay | Admitting: General Practice

## 2021-12-08 ENCOUNTER — Other Ambulatory Visit: Payer: Self-pay

## 2021-12-08 VITALS — BP 112/62 | HR 51 | Ht 63.0 in | Wt 177.2 lb

## 2021-12-08 DIAGNOSIS — I25119 Atherosclerotic heart disease of native coronary artery with unspecified angina pectoris: Secondary | ICD-10-CM

## 2021-12-08 DIAGNOSIS — I10 Essential (primary) hypertension: Secondary | ICD-10-CM

## 2021-12-08 DIAGNOSIS — E7800 Pure hypercholesterolemia, unspecified: Secondary | ICD-10-CM | POA: Diagnosis not present

## 2021-12-08 DIAGNOSIS — R0789 Other chest pain: Secondary | ICD-10-CM | POA: Diagnosis not present

## 2021-12-08 MED ORDER — PANTOPRAZOLE SODIUM 20 MG PO TBEC: 20.0000 mg | DELAYED_RELEASE_TABLET | Freq: Every day | ORAL | 4 refills | Status: DC

## 2021-12-08 NOTE — Patient Instructions (Signed)
Medication Instructions:  START PROTONIX 20MG  DAILY  *If you need a refill on your cardiac medications before your next appointment, please call your pharmacy*  Lab Work:   Testing/Procedures:  NONE    NONE  Special Instructions PLEASE READ AND FOLLOW GERD DIET-ATTACHED  PLEASE MAINTAIN PHYSICAL ACTIVITY AS TOLERATED   Follow-Up: Your next appointment:  2 month(s) In Person with Skeet Latch, MD  At [facility], you and your health needs are our priority.  As part of our continuing mission to provide you with exceptional heart care, we have created designated Provider Care Teams.  These Care Teams include your primary Cardiologist (physician) and Advanced Practice Providers (APPs -  Physician Assistants and Nurse Practitioners) who all work together to provide you with the care you need, when you need it.  We recommend signing up for the patient portal called "MyChart".  Sign up information is provided on this After Visit Summary.  MyChart is used to connect with patients for Virtual Visits (Telemedicine).  Patients are able to view lab/test results, encounter notes, upcoming appointments, etc.  Non-urgent messages can be sent to your provider as well.   To learn more about what you can do with MyChart, go to [email or web address].              6 SALTY THINGS TO AVOID     1,800MG  DAILY      Food Choices for Gastroesophageal Reflux Disease, Adult When you have gastroesophageal reflux disease (GERD), the foods you eat and your eating habits are very important. Choosing the right foods can help ease your discomfort. Think about working with a food expert (dietitian) to help you make good choices. What are tips for following this plan? Reading food labels Look for foods that are low in saturated fat. Foods that may help with your symptoms include: Foods that have less than 5% of daily value (DV) of fat. Foods that have 0 grams of trans fat. Cooking Do not fry your  food. Cook your food by baking, steaming, grilling, or broiling. These are all methods that do not need a lot of fat for cooking. To add flavor, try to use herbs that are low in spice and acidity. Meal planning  Choose healthy foods that are low in fat, such as: Fruits and vegetables. Whole grains. Low-fat dairy products. Lean meats, fish, and poultry. Eat small meals often instead of eating 3 large meals each day. Eat your meals slowly in a place where you are relaxed. Avoid bending over or lying down until 2-3 hours after eating. Limit high-fat foods such as fatty meats or fried foods. Limit your intake of fatty foods, such as oils, butter, and shortening. Avoid the following as told by your doctor: Foods that cause symptoms. These may be different for different people. Keep a food diary to keep track of foods that cause symptoms. Alcohol. Drinking a lot of liquid with meals. Eating meals during the 2-3 hours before bed. Lifestyle Stay at a healthy weight. Ask your doctor what weight is healthy for you. If you need to lose weight, work with your doctor to do so safely. Exercise for at least 30 minutes on 5 or more days each week, or as told by your doctor. Wear loose-fitting clothes. Do not smoke or use any products that contain nicotine or tobacco. If you need help quitting, ask your doctor. Sleep with the head of your bed higher than your feet. Use a wedge under the mattress or blocks under the  bed frame to raise the head of the bed. Chew sugar-free gum after meals. What foods should eat? Eat a healthy, well-balanced diet of fruits, vegetables, whole grains, low-fat dairy products, lean meats, fish, and poultry. Each person is different. Foods that may cause symptoms in one person may not cause any symptoms in another person. Work with your doctor to find foods that are safe for you. The items listed above may not be a complete list of what you can eat and drink. Contact a food expert  for more options. What foods should I avoid? Limiting some of these foods may help in managing the symptoms of GERD. Everyone is different. Talk with a food expert or your doctor to help you find the exact foods to avoid, if any. Fruits Any fruits prepared with added fat. Any fruits that cause symptoms. For some people, this may include citrus fruits, such as oranges, grapefruit, pineapple, and lemons. Vegetables Deep-fried vegetables. [place] fries. Any vegetables prepared with added fat. Any vegetables that cause symptoms. For some people, this may include tomatoes and tomato products, chili peppers, onions and garlic, and horseradish. Grains Pastries or quick breads with added fat. Meats and other proteins High-fat meats, such as fatty beef or pork, hot dogs, ribs, ham, sausage, salami, and bacon. Fried meat or protein, including fried fish and fried chicken. Nuts and nut butters, in large amounts. Dairy Whole milk and chocolate milk. Sour cream. Cream. Ice cream. Cream cheese. Milkshakes. Fats and oils Butter. Margarine. Shortening. Ghee. Beverages Coffee and tea, with or without caffeine. Carbonated beverages. Sodas. Energy drinks. Fruit juice made with acidic fruits, such as orange or grapefruit. Tomato juice. Alcoholic drinks. Sweets and desserts Chocolate and cocoa. Donuts. Seasonings and condiments Pepper. Peppermint and spearmint. Added salt. Any condiments, herbs, or seasonings that cause symptoms. For some people, this may include curry, hot sauce, or vinegar-based salad dressings. The items listed above may not be a complete list of what you should not eat and drink. Contact a food expert for more options. Questions to ask your doctor Diet and lifestyle changes are often the first steps that are taken to manage symptoms of GERD. If diet and lifestyle changes do not help, talk with your doctor about taking medicines. Where to find more information International Foundation for  Gastrointestinal Disorders: aboutgerd.org Summary When you have GERD, food and lifestyle choices are very important in easing your symptoms. Eat small meals often instead of 3 large meals a day. Eat your meals slowly and in a place where you are relaxed. Avoid bending over or lying down until 2-3 hours after eating. Limit high-fat foods such as fatty meats or fried foods. This information is not intended to replace advice given to you by your health care provider. Make sure you discuss any questions you have with your health care provider. Document Revised: 06/20/2020 Document Reviewed: 06/20/2020 Elsevier Patient Education  [place].

## 2021-12-12 ENCOUNTER — Telehealth (HOSPITAL_BASED_OUTPATIENT_CLINIC_OR_DEPARTMENT_OTHER): Payer: Self-pay | Admitting: Cardiovascular Disease

## 2021-12-12 NOTE — Telephone Encounter (Signed)
°*  STAT* If patient is at the pharmacy, call can be transferred to refill team.   1. Which medications need to be refilled? (please list name of each medication and dose if known) pantoprazole (PROTONIX) 20 MG tablet  2. Which pharmacy/location (including street and city if local pharmacy) is medication to be sent to? [facility] [phone] - [place], [place] AT [place]  3. Do they need a 30 day or 90 day supply? [place]

## 2021-12-12 NOTE — Telephone Encounter (Signed)
Spoke to patient and informed her that the refill for Pantoprazole (Protonix) 20 MG was sent to [facility] on 12/08/21. Patient will call [facility] to confirm they received refill request. And if they have not I'll send refill to another pharmacy for the patient.

## 2021-12-26 ENCOUNTER — Other Ambulatory Visit: Payer: Self-pay | Admitting: Obstetrics and Gynecology

## 2022-01-01 ENCOUNTER — Other Ambulatory Visit: Payer: Self-pay

## 2022-01-01 DIAGNOSIS — I10 Essential (primary) hypertension: Secondary | ICD-10-CM

## 2022-01-01 MED ORDER — EDARBYCLOR 40-12.5 MG PO TABS: 1.0000 | ORAL_TABLET | Freq: Every day | ORAL | 1 refills | Status: DC

## 2022-01-09 ENCOUNTER — Other Ambulatory Visit: Payer: Self-pay

## 2022-01-09 DIAGNOSIS — I10 Essential (primary) hypertension: Secondary | ICD-10-CM

## 2022-01-09 MED ORDER — EDARBYCLOR 40-12.5 MG PO TABS: 1.0000 | ORAL_TABLET | Freq: Every day | ORAL | 1 refills | Status: DC

## 2022-01-10 ENCOUNTER — Telehealth: Payer: Self-pay | Admitting: General Practice

## 2022-01-10 MED ORDER — PANTOPRAZOLE SODIUM 20 MG PO TBEC: 20.0000 mg | DELAYED_RELEASE_TABLET | Freq: Every day | ORAL | 4 refills | Status: DC

## 2022-01-10 NOTE — Telephone Encounter (Signed)
Pt c/o medication issue:  1. Name of Medication: pantoprazole (PROTONIX) 20 MG tablet  2. How are you currently taking this medication (dosage and times per day)? As written  3. Are you having a reaction (difficulty breathing--STAT)? No   4. What is your medication issue? Patient had medication sent to wrong pharmacy. Please send prescription to  [place], [place]

## 2022-01-10 NOTE — Telephone Encounter (Signed)
Refill sent to pharmacy.   

## 2022-02-05 ENCOUNTER — Ambulatory Visit: Payer: 59 | Admitting: Nurse Practitioner

## 2022-02-09 ENCOUNTER — Ambulatory Visit (HOSPITAL_BASED_OUTPATIENT_CLINIC_OR_DEPARTMENT_OTHER): Payer: 59 | Admitting: General Practice

## 2022-02-09 NOTE — Progress Notes (Signed)
Cardiology Clinic Note   Patient Name: Mary Holt Date of Encounter: 02/12/2022  Primary Care Provider:  [name], [employer] Primary Cardiologist:  Skeet Latch, MD  Patient Profile    Mary Holt 61 year old female presents today for follow-up of her coronary artery disease and essential hypertension.    Past Medical History    Past Medical History:  Diagnosis Date   Angina pectoris ([place]) 02/22/2020   CAD in native artery 02/22/2020   Chest pain of uncertain etiology 7/91/5056   Diabetes mellitus without complication ([place])    Heart murmur    asymptomatic   Hyperlipidemia    Hypertension    Kidney stones    Kidney stones    several times, last  one 2014   Mitral valve prolapse    MVP (mitral valve prolapse)    Pure hypercholesterolemia 05/02/2020   Snoring 01/06/2020   Past Surgical History:  Procedure Laterality Date   BACK SURGERY     x2   BREAST SURGERY     cyst right breast   CARDIAC CATHETERIZATION     CARPAL TUNNEL RELEASE Bilateral    CORONARY STENT INTERVENTION N/A 02/26/2020   Procedure: CORONARY STENT INTERVENTION;  Surgeon: Leonie Man, MD;  Location: [facility];  Service: Cardiovascular;  Laterality: N/A;   DILATATION & CURRETTAGE/HYSTEROSCOPY WITH RESECTOCOPE N/A 08/11/2013   Procedure: DILATATION & CURETTAGE/HYSTEROSCOPY WITH RESECTOCOPE;  Surgeon: Marvene Staff, MD;  Location: [facility];  Service: Gynecology;  Laterality: N/A;   LEFT HEART CATH AND CORONARY ANGIOGRAPHY N/A 02/26/2020   Procedure: LEFT HEART CATH AND CORONARY ANGIOGRAPHY;  Surgeon: Leonie Man, MD;  Location: [facility];  Service: Cardiovascular;  Laterality: N/A;   TONSILLECTOMY     TRIGGER FINGER RELEASE     left thumb   TUBAL LIGATION      Allergies  Allergies  Allergen Reactions   Nabumetone Rash   Penicillins Rash    Did it involve swelling of the face/tongue/throat, SOB, or low BP? Yes Did it involve sudden or severe rash/hives, skin  peeling, or any reaction on the inside of your mouth or nose? No Did you need to seek medical attention at a hospital or doctor's office? No When did it last happen? >10 years ago If all above answers are NO, may proceed with cephalosporin use.     History of Present Illness    Mary Holt has a PMH of coronary artery disease, HTN, diabetes, hyperlipidemia, and mitral valve prolapse.  She has a long history of chest pain dating back to childhood.  She also has a long history of mitral valve prolapse.  She has previously taken antibiotics prior to going to the dentist and developed a rash.  She was subsequently told that she no longer needs to take antibiotics.  She has had a history of chest discomfort when she lays down or with bending over.  This was believed to be related to stress.  She has also noticed chest discomfort with stressful work situations.  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  She does not do formal exercise outside of work.  She underwent a coronary CTA 01/2020 that showed 25-49% ostial LAD lesion, a greater than 70% lesion in the ostial D1.  Her FFR was also abnormal in this area.  She underwent cardiac catheterization on 02/26/2020 which showed a 95% second diagonal lesion, a 95% proximal circumflex to mid circumflex lesion, and  35% mid LAD stenosis.  She received PCI and DES x2.  Her EF at that time was normal with normal LVEDP.  Her simvastatin 10 mg was switched to rosuvastatin 40 mg post-cath.   She presented to the clinic 03/07/2020 for follow-up and stated she felt well.  Has had no chest pain since her stents were placed.  She stated she was eating a low-sodium heart healthy diet and had been increasing her physical activity slowly.  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  She stated she  had one episode of dizziness that happened while she was seated.  She had been compliant  with her medications, had not noticed any side effects and did not have any questions about them.   She contacted the cardiology clinic 03/16/2020 and stated that she was having symptoms of chest pressure while working.  She was prescribed metoprolol tartrate 12.5 mg daily and omeprazole 40 mg daily.  She was contacted 03/23/2020 and stated that she was still having pressure type sensations in her chest.  Her metoprolol had helped for a few days and then her discomfort returned.   She presented to the clinic 03/29/20 for follow-up evaluation and stated she did not experience sustained relief from metoprolol or Prilosec and stopped taking the medications after a few days.  She stated that the metoprolol caused her to be fatigued and caused her arms to feel heavy with the lifting work that she does.  When asked about her chest pressure she stated that she notices the pressure when she is active at work lifting and steaming close.  She did experience some left lateral discomfort when she was not active at home cooking or watching television.  She was also unsure how to use her USB cable with her blood pressure machine we gave her.  All questions were answered and demonstration provided.  I  started amlodipine 5 mg, had her continue taking her blood pressure, and had her return in 2 weeks.   She presented to the clinic 04/18/2020 and stated she did not like the way that the amlodipine made her feel.  She felt the same way on the metoprolol.  She indicated that they both gave her a mild headache and caused some fatigue.  She stopped taking the amlodipine.  She continued to have chest discomfort with increased physical activity and noted some nonexertional left-sided flank discomfort at rest.  I prescribed carvedilol 3.125 and had her follow-up with Dr. Oval Linsey as scheduled.  She had received her first COVID-19, [place] vaccination and was scheduled to have her second vaccination at the beginning of May.  I  had her  continue her blood pressure log and bring it to her next appointment.   She is seen by Dr. Oval Linsey on 05/02/2020.  During that time she reported that she had significantly less chest discomfort after being switched to carvedilol.  She continued to struggle with sleep.  She was excited to start cardiac rehab and was working on [employer].  She was following a heart healthy diet.  She denied lower extremity edema, orthopnea, PND, chest pain, and her breathing was stable.  She presented to the clinic 04/03/21 for follow-up evaluation stated she had been working more at her job.  They had been working 9-hour days Saturdays as well.  She reported being very physically active, with no sitting and included lifting lighter objects with frequent bending.  She continued to notice occasional episodes of extended dull chest ache.  She reported that  the pain she had prior to her stenting and dissipates without intervention was not similar.  She denied exertional chest discomfort.  She stated that her Nexletol was stopped by her insurance.  We had received prior authorization and restarted the medication.  I will had her continue her increase physical activity, gave her the salty 6 diet sheet, stopped her Plavix, and planned her follow-up in 6 months.  She presented to the clinic 12/08/21 for follow-up evaluation stated she continued to notice a dull ache in her chest.  She noticed it occasionally in the morning when she would  wake up.  She continued to be very physically active walking and at work.  She denied exertional chest discomfort.  We reviewed her previous coronary CTA, angiography, and LDL.  She expressed understanding.  Her symptoms appeared to be related to acid reflux.  I prescribed Protonix and gave her the GERD diet information.  We  planned follow-up for 2 to 3 months.  She presents to the clinic today for follow-up evaluation states she has noticed an improvement with her reflux by taking  pantoprazole.  She reports that she misunderstood her rosuvastatin dosing and has stopped the prescription.  She is also not taking Nexletol.  She reports that her prescriptions are costing in excess of $200 per month which she is finding her to afford.  She is going to speak with her PCP about adjusting her antihypertensive medication.  We reviewed her fasting lipid panel and previous coronary artery disease.  I will restart her rosuvastatin placing her on fenofibrate.  She reports that she is more physically active and walking 30 minutes most days per week.  We will repeat fasting lipid panel in April and plan follow-up for 6 to 9 months.  Today she denies shortness of breath, lower extremity edema, fatigue, palpitations, melena, hematuria, hemoptysis, diaphoresis, weakness, presyncope, syncope, orthopnea, and PND.  Home Medications    Prior to Admission medications   Medication Sig Start Date End Date Taking? Authorizing Provider  aspirin EC 81 MG tablet Take 1 tablet (81 mg total) by mouth daily. 02/16/20   Skeet Latch, MD  benzonatate (TESSALON PERLES) 100 MG capsule Take 1 capsule (100 mg total) by mouth every 6 (six) hours as needed. 12/26/20 12/26/21  [name], FNP  carvedilol (COREG) 3.125 MG tablet TAKE 1 TABLET(3.125 MG) BY MOUTH TWICE DAILY 07/28/20   Deberah Pelton, NP  clopidogrel (PLAVIX) 75 MG tablet TAKE 1 TABLET BY MOUTH  DAILY WITH BREAKFAST 01/09/21   [name], FNP  EDARBYCLOR 40-12.5 MG TABS TAKE 1 TABLET BY MOUTH  DAILY 10/27/20   [name], FNP  JANUVIA 25 MG tablet TAKE 1 TABLET BY MOUTH  DAILY 11/21/20   [name], FNP  metFORMIN (GLUCOPHAGE) 500 MG tablet TAKE 1 TABLET BY MOUTH  TWICE DAILY WITH A MEAL 01/09/21   [name], FNP  NEXLETOL 180 MG TABS TAKE 1 TABLET BY MOUTH  DAILY 01/13/21   Rodriguez-Guzman, Raquel, RPH-CPP  rosuvastatin (CRESTOR) 40 MG tablet TAKE 1 TABLET BY MOUTH  DAILY AT 6 PM 01/09/21   [name], FNP    Family History     Family History  Problem Relation Age of Onset   Stroke Mother    Heart attack Father    Breast cancer Sister    Heart attack Brother    Ovarian cancer Sister    Lymphoma Sister  Heart attack Brother    Colon cancer Neg Hx    Colon polyps Neg Hx    Esophageal cancer Neg Hx    Rectal cancer Neg Hx    Stomach cancer Neg Hx    She indicated that her mother is deceased. She indicated that her father is deceased. She indicated that four of her five sisters are alive. She indicated that five of her six brothers are alive. She indicated that her maternal grandmother is deceased. She indicated that her maternal grandfather is deceased. She indicated that her paternal grandmother is deceased. She indicated that her paternal grandfather is deceased. She indicated that both of her sons are alive. She indicated that the status of her neg hx is unknown.   Social History    Social History   Socioeconomic History   Marital status: Single    Spouse name: Not on file   Number of children: Not on file   Years of education: 12   Highest education level: Not on file  Occupational History   Not on file  Tobacco Use   Smoking status: Never   Smokeless tobacco: Never  Substance and Sexual Activity   Alcohol use: No   Drug use: No   Sexual activity: Not on file  Other Topics Concern   Not on file  Social History Narrative   Not on file   Social Determinants of Health   Financial Resource Strain: Not on file  Food Insecurity: Not on file  Transportation Needs: Not on file  Physical Activity: Not on file  Stress: Not on file  Social Connections: Not on file  Intimate Partner Violence: Not on file     Review of Systems    General:  No chills, fever, night sweats or weight changes.  Cardiovascular:  No chest pain, dyspnea on exertion, edema, orthopnea, palpitations, paroxysmal nocturnal dyspnea. Dermatological: No rash, lesions/masses Respiratory: No cough, dyspnea Urologic: No  hematuria, dysuria Abdominal:   No nausea, vomiting, diarrhea, bright red blood per rectum, melena, or hematemesis Neurologic:  No visual changes, wkns, changes in mental status. All other systems reviewed and are otherwise negative except as noted above.  Physical Exam    VS:  BP 136/78    Pulse 79    Ht 5\' 3"  (1.6 m)    Wt 176 lb (79.8 kg)    LMP 09/30/2014    SpO2 98%    BMI 31.18 kg/m  , BMI Body mass index is 31.18 kg/m. GEN: Well nourished, well developed, in no acute distress. HEENT: normal. Neck: Supple, no JVD, carotid bruits, or masses. Cardiac: RRR, no murmurs, rubs, or gallops. No clubbing, cyanosis, edema.  Radials/DP/PT 2+ and equal bilaterally.  Respiratory:  Respirations regular and unlabored, clear to auscultation bilaterally. GI: Soft, nontender, nondistended, BS + x 4. MS: no deformity or atrophy. Skin: warm and dry, no rash. Neuro:  Strength and sensation are intact. Psych: Normal affect.  Accessory Clinical Findings    Recent Labs: 03/29/2021: ALT 32; Hemoglobin 11.7; Platelets 386 08/01/2021: BUN 25; Creatinine, Ser 0.91; Potassium 4.1; Sodium 140   Recent Lipid Panel    Component Value Date/Time   CHOL 134 03/29/2021 1615   TRIG 78 03/29/2021 1615   HDL 49 03/29/2021 1615   CHOLHDL 2.7 03/29/2021 1615   LDLCALC 70 03/29/2021 1615    ECG personally reviewed by me today-none today.  EKG 12/08/2021 normal sinus rhythm no ST or T wave deviation 81 bpm  EKG 04/03/2021 normal sinus rhythm  no ST or T wave deviation 77 bpm- No acute changes  EKG 03/07/2020  sinus tachycardia 103 bpm no ST or T wave deviation.-   EKG 02/29/2020 Normal sinus rhythm no ST or T wave deviation 86 bpm   Echocardiogram 01/20/2020 IMPRESSIONS     1. Left ventricular ejection fraction, by visual estimation, is 60 to  65%. The left ventricle has normal function. There is no left ventricular  hypertrophy.   2. The left ventricle has no regional wall motion abnormalities.   3.  Global right ventricle has normal systolic function.The right  ventricular size is normal. No increase in right ventricular wall  thickness.   4. Left atrial size was normal.   5. Right atrial size was normal.   6. The mitral valve is normal in structure. Trivial mitral valve  regurgitation. No evidence of mitral stenosis.   7. Prolapse not appreciated.   8. The tricuspid valve is normal in structure.   9. The tricuspid valve is normal in structure. Tricuspid valve  regurgitation is trivial.  10. The aortic valve is normal in structure. Aortic valve regurgitation is  not visualized. No evidence of aortic valve sclerosis or stenosis.  11. The pulmonic valve was normal in structure. Pulmonic valve  regurgitation is trivial.  12. Normal pulmonary artery systolic pressure.  13. The inferior vena cava is normal in size with greater than 50%  respiratory variability, suggesting right atrial pressure of 3 mmHg.   Cardiac catheterization 02/26/2020    LESION #1: 2nd Diag lesion is 95% stenosed. A drug-eluting stent was successfully placed using a STENT RESOLUTE ONYX 2.0X8. Stable post dilation from 2.2 to 2.3 mm Post intervention, there is a 0% residual stenosis. -------------- LESION #2: Prox Cx to Mid Cx (OM 2) lesion is 95% stenosed. This lesion was not assessed by CT FFR, but is clearly significant. A drug-eluting stent was successfully placed using a STENT RESOLUTE ONYX 2.0X12. Postdilated to 2.2 mm Post intervention, there is a 0% residual stenosis. -------------- Mid LAD lesion is 35% stenosed. Dist LAD lesion is 40% stenosed. Prox RCA to Mid RCA lesion is 45% stenosed. Dist RCA lesion is 45% stenosed. -By CT FFR, summation of these 2 lesions was not visualized significant. (0.81)   SUMMARY Severe two-vessel disease involving proximal 2nd Diag and LCx-OM 2 with moderate disease in the mid and distal RCA as well as distal LAD. Successful two-vessel PCI: 2nd Diag 95%-0% w/ Resolute Onyx  DES 2.0 mm x 8 mm (2.2-2.3 mm @ post-dilation); LCx-OM1 95%-0% w/ Resolute Onyx DES 2.0 mm x 12 mm (2.2 mm @ post-dilation). Documented normal EF by Echo; normal LVEDP     RECOMMENDATIONS Patient should be stable for same-day discharge given to uneventful PCI's.  She will follow-up with Dr. Oval Linsey or APP Per Dr. Doy Mince note, I have switched from simvastatin 10 mg to rosuvastatin 40 mg. Do not restart Metformin for 48 hours post cath; restart ARB tomorrow AM   Diagnostic Dominance: Right  Intervention       Assessment & Plan   1. Coronary artery disease-denies chest pain today.  Symptoms improved with addition of pantoprazole.  Does continue to notice occasional episodes of chest discomfort.  She attributes this to sleeping on her side.   CTA 01/2020 that showed 25-49% ostial LAD lesion, a greater than 70% lesion in the ostial D1.  Her FFR was also abnormal in this area.  She underwent cardiac catheterization on 02/26/2020 which showed a 95% second diagonal lesion,  a 95% proximal circumflex to mid circumflex lesion, and 35% mid LAD stenosis.  She received PCI and DES x2.  Had normal echo at that time. Continue aspirin,  nitroglycerin, rosuvastatin,  Start fenofibrate Heart healthy low-sodium diet Maintain physical activity.  GERD-much better controlled with addition of pantoprazole.  Denies exertional chest pain.  Appears to be related to reflux versus precordial type pain. GERD diet Continue pantoprazole 20 mg daily   Atypical chest pain-stable .  Did not tolerate metoprolol tartrate or amlodipine Continue heart healthy low-sodium diet-salty 6 given Increase physical activity as tolerated  Essential hypertension-BP today  136/78  Well-controlled at home Continue azilsartan-chlorthalidone 40-12.5 mg daily,  Heart healthy low-sodium diet-salty 6 given Increase physical activity as tolerated   Hyperlipidemia-LDL 70 on 03/29/2021.  Misunderstood rosuvastatin prescription. Restart  rosuvastatin Order fenofibrate. Per healthy low-sodium high-fiber diet Increase physical activity as tolerated Repeat fasting lipids and LFTs 4/23  Disposition: Follow-up with Dr. Oval Linsey in  6-9 months.   Jossie Ng. Kahla Risdon NP-C    02/12/2022, 9:48 AM [place] [address] Office [phone] Fax [phone]  Notice: This dictation was prepared with Dragon dictation along with smaller phrase technology. Any transcriptional errors that result from this process are unintentional and may not be corrected upon review.  I spent 14 minutes examining this patient, reviewing medications, and using patient centered shared decision making involving her cardiac care.  Prior to her visit I spent greater than 20 minutes reviewing her past medical history,  medications, and prior cardiac tests.

## 2022-02-12 ENCOUNTER — Other Ambulatory Visit: Payer: Self-pay

## 2022-02-12 ENCOUNTER — Ambulatory Visit (INDEPENDENT_AMBULATORY_CARE_PROVIDER_SITE_OTHER): Payer: 59 | Admitting: General Practice

## 2022-02-12 ENCOUNTER — Encounter: Payer: Self-pay | Admitting: General Practice

## 2022-02-12 VITALS — BP 136/78 | HR 79 | Ht 63.0 in | Wt 176.0 lb

## 2022-02-12 DIAGNOSIS — R0789 Other chest pain: Secondary | ICD-10-CM | POA: Diagnosis not present

## 2022-02-12 DIAGNOSIS — I25119 Atherosclerotic heart disease of native coronary artery with unspecified angina pectoris: Secondary | ICD-10-CM | POA: Diagnosis not present

## 2022-02-12 DIAGNOSIS — K2100 Gastro-esophageal reflux disease with esophagitis, without bleeding: Secondary | ICD-10-CM

## 2022-02-12 DIAGNOSIS — I10 Essential (primary) hypertension: Secondary | ICD-10-CM | POA: Diagnosis not present

## 2022-02-12 DIAGNOSIS — E7800 Pure hypercholesterolemia, unspecified: Secondary | ICD-10-CM

## 2022-02-12 DIAGNOSIS — Z79899 Other long term (current) drug therapy: Secondary | ICD-10-CM

## 2022-02-12 MED ORDER — ROSUVASTATIN CALCIUM 40 MG PO TABS: ORAL_TABLET | ORAL | 2 refills | Status: DC

## 2022-02-12 MED ORDER — FENOFIBRATE 120 MG PO TABS: 120.0000 mg | ORAL_TABLET | Freq: Every day | ORAL | 9 refills | Status: DC

## 2022-02-12 NOTE — Patient Instructions (Signed)
Medication Instructions:  STOP NEXLETOL  START FENOFIBRATE 120MG  DALY  *If you need a refill on your cardiac medications before your next appointment, please call your pharmacy*  Lab Work:        FASTING LIPID AND LFT IN April 2023  Special Instructions PLEASE READ AND FOLLOW SALTY 6-ATTACHED-1,800mg  daily  PLEASE MAINTAIN PHYSICAL ACTIVITY AS TOLERATED   Follow-Up: Your next appointment:  6-9 month(s) In Person with Skeet Latch, MD   Please call our office 2 months in advance to schedule this appointment  :1  At [facility], you and your health needs are our priority.  As part of our continuing mission to provide you with exceptional heart care, we have created designated Provider Care Teams.  These Care Teams include your primary Cardiologist (physician) and Advanced Practice Providers (APPs -  Physician Assistants and Nurse Practitioners) who all work together to provide you with the care you need, when you need it.  We recommend signing up for the patient portal called "MyChart".  Sign up information is provided on this After Visit Summary.  MyChart is used to connect with patients for Virtual Visits (Telemedicine).  Patients are able to view lab/test results, encounter notes, upcoming appointments, etc.  Non-urgent messages can be sent to your provider as well.   To learn more about what you can do with MyChart, go to [email or web address].              6 SALTY THINGS TO AVOID     1,800MG  DAILY

## 2022-02-13 ENCOUNTER — Telehealth: Payer: Self-pay | Admitting: Cardiovascular Disease

## 2022-02-13 NOTE — Telephone Encounter (Signed)
Pt c/o medication issue:  1. Name of Medication: Fenofibrate 120 MG TABS  2. How are you currently taking this medication (dosage and times per day)? Not currently taking   3. Are you having a reaction (difficulty breathing--STAT)? no  4. What is your medication issue? Pt went to refill medication and was told that her insurance is not covering it... copay is $600 + please further advise

## 2022-02-14 ENCOUNTER — Telehealth: Payer: Self-pay | Admitting: Cardiovascular Disease

## 2022-02-14 MED ORDER — ROSUVASTATIN CALCIUM 40 MG PO TABS: ORAL_TABLET | ORAL | 3 refills | Status: DC

## 2022-02-14 NOTE — Telephone Encounter (Signed)
**Note De-Identified [name] Obfuscation** Letter received from [facility]: Mary Holt [address] [place], [place] [address] SUMMARY OF PRIOR AUTHORIZATION DECISION DECISION: DENIED This medication is not covered by your plan. REASON: This decision is based on your plan's drug coverage policy for this medication. DRUG NAME: Fenofibrate Tab 120mg  Mary Holt PATIENT INFO: Member ID: [id number] Case ID: [id number] PROVIDER: Skeet Latch Talk with your provider about one of these options: 1. Switch to another medication that's covered by the plan. 2. You or your provider can appeal this decision. See APPEALS section of NEXT STEPS: this notice.  I will forward this message to Dr Oval Linsey and her nurse for advisement to the pt.

## 2022-02-14 NOTE — Telephone Encounter (Signed)
**Note De-Identified Mary Holt Obfuscation** Fenofibrate PA started through covermymeds. Key: [id number]

## 2022-02-14 NOTE — Telephone Encounter (Signed)
°*  STAT* If patient is at the pharmacy, call can be transferred to refill team.   1. Which medications need to be refilled? (please list name of each medication and dose if known) rosuvastatin (CRESTOR) 40 MG tablet  2. Which pharmacy/location (including street and city if local pharmacy) is medication to be sent to? [facility] [phone] - [place], [address] AT [place]  3. Do they need a 30 day or 90 day supply? [place]

## 2022-02-19 MED ORDER — FENOFIBRATE 145 MG PO TABS: 145.0000 mg | ORAL_TABLET | Freq: Every day | ORAL | 1 refills | Status: DC

## 2022-02-19 NOTE — Telephone Encounter (Signed)
Fenofibrate is generic and her plan should definitely cover it, it's just available in about 20 different strengths so they probably have some on their formulary and don't cover others. Commercial formularies are very difficult to find online. The 145mg  is used most commonly though, would try sending that in to her pharmacy and see if they can process it.

## 2022-02-19 NOTE — Telephone Encounter (Signed)
**Note De-Identified Blayne Frankie Obfuscation** I changed the pts Fenofibrate to 145 mg and then called [facility] to see if the pt's ins covers it. Per the pharmacist the pts ins is covering this mg for $44.14/90 day supply.  I called the pt and she stated that she can afford this price.  She is aware that I am checking with Dr Oval Linsey to be sure it is ok with her for the pt to take Fenofibrate 145 mg daily in place of the 120 mg and that I will call her back if Dr Oval Linsey has other recommendations.  The pt thanked me for my assistance.

## 2022-02-21 ENCOUNTER — Encounter: Payer: Self-pay | Admitting: Nurse Practitioner

## 2022-02-21 ENCOUNTER — Other Ambulatory Visit: Payer: Self-pay

## 2022-02-21 ENCOUNTER — Ambulatory Visit (INDEPENDENT_AMBULATORY_CARE_PROVIDER_SITE_OTHER): Payer: 59 | Admitting: Nurse Practitioner

## 2022-02-21 VITALS — BP 128/72 | HR 85 | Temp 97.4°F | Ht 63.0 in | Wt 177.4 lb

## 2022-02-21 DIAGNOSIS — Z23 Encounter for immunization: Secondary | ICD-10-CM

## 2022-02-21 DIAGNOSIS — I119 Hypertensive heart disease without heart failure: Secondary | ICD-10-CM

## 2022-02-21 DIAGNOSIS — E7800 Pure hypercholesterolemia, unspecified: Secondary | ICD-10-CM | POA: Diagnosis not present

## 2022-02-21 DIAGNOSIS — E119 Type 2 diabetes mellitus without complications: Secondary | ICD-10-CM

## 2022-02-21 NOTE — Progress Notes (Signed)
I,Mary Holt,acting as a Education administrator for [employer], FNP.,have documented all relevant documentation on the behalf of Mary Brine, FNP,as directed by  Mary Brine, FNP while in the presence of Mary Holt, Mary Holt.  This visit occurred during the SARS-CoV-2 public health emergency.  Safety protocols were in place, including screening questions prior to the visit, additional usage of staff PPE, and extensive cleaning of exam room while observing appropriate contact time as indicated for disinfecting solutions.  Subjective:     Patient ID: Mary Holt , female    DOB: [birth year] , 61 y.o.   MRN: [id number]   Chief Complaint  Patient presents with   Diabetes    HPI  The patient is here today for diabetes.   Wt Readings from Last 3 Encounters: 02/21/22 : 177 lb 6.4 oz (80.5 kg) 02/12/22 : 176 lb (79.8 kg) 12/08/21 : 177 lb 3.2 oz (80.4 kg) She has been eating better with salad, fish, rotissiere chicken, ground [place].  She is getting ready to get busy at work. She is taking [place] but reports it is $200 a month until she meets her deductible of $1500. She was advised by her mailorder she would have to pay the whole $500/for 30 days.   Diabetes She presents for her follow-up diabetic visit. She has type 2 diabetes mellitus. Pertinent negatives for hypoglycemia include no headaches. Pertinent negatives for diabetes include no chest pain. There are no hypoglycemic complications. There are no diabetic complications. Risk factors for coronary artery disease include obesity and sedentary lifestyle. Current diabetic treatment includes oral agent (dual therapy). She has not had a previous visit with a dietitian. She participates in exercise daily (depending on weather). (Not checking her blood sugar regularly) An ACE inhibitor/angiotensin II receptor blocker is being taken. She does not see a podiatrist.Eye exam is not current.  Hypertension This is a chronic problem. The current episode  started more than 1 year ago. The problem has been gradually improving since onset. The problem is controlled. Pertinent negatives include no anxiety, chest pain, headaches or palpitations. There are no associated agents to hypertension. Risk factors for coronary artery disease include dyslipidemia, sedentary lifestyle and obesity. Past treatments include angiotensin blockers. There are no compliance problems.  There is no history of angina. There is no history of chronic renal disease.    Past Medical History:  Diagnosis Date   Angina pectoris ([place]) 02/22/2020   CAD in native artery 02/22/2020   Chest pain of uncertain etiology 3/49/1791   Diabetes mellitus without complication (HCC)    Heart murmur    asymptomatic   Hyperlipidemia    Hypertension    Kidney stones    Kidney stones    several times, last  one 2014   Mitral valve prolapse    MVP (mitral valve prolapse)    Pure hypercholesterolemia 05/02/2020   Snoring 01/06/2020     Family History  Problem Relation Age of Onset   Stroke Mother    Heart attack Father    Breast cancer Sister    Heart attack Brother    Ovarian cancer Sister    Lymphoma Sister    Heart attack Brother    Colon cancer Neg Hx    Colon polyps Neg Hx    Esophageal cancer Neg Hx    Rectal cancer Neg Hx    Stomach cancer Neg Hx      Current Outpatient Medications:    aspirin EC 81 MG tablet, Take 1 tablet (81 mg total)  by mouth daily., Disp: 90 tablet, Rfl: 3   Azilsartan-Chlorthalidone (EDARBYCLOR) 40-12.5 MG TABS, Take 1 tablet by mouth daily., Disp: 90 tablet, Rfl: 1   benzonatate (TESSALON) 100 MG capsule, Take by mouth., Disp: , Rfl:    fenofibrate (TRICOR) 145 MG tablet, Take 1 tablet (145 mg total) by mouth daily., Disp: 90 tablet, Rfl: 1   metFORMIN (GLUCOPHAGE XR) 500 MG 24 hr tablet, Take 1 tablet (500 mg total) by mouth daily with breakfast., Disp: 90 tablet, Rfl: 1   neomycin-polymyxin-hydrocortisone (CORTISPORIN) 3.5-10000-1 OTIC suspension,  Apply 1-2 drops daily after soaking and cover with bandaid, Disp: 10 mL, Rfl: 0   pantoprazole (PROTONIX) 20 MG tablet, Take 1 tablet (20 mg total) by mouth daily., Disp: 30 tablet, Rfl: 4   rosuvastatin (CRESTOR) 40 MG tablet, TAKE 1 TABLET BY MOUTH  DAILY AT 6 PM, Disp: 90 tablet, Rfl: 3   sitaGLIPtin (JANUVIA) 50 MG tablet, Take 1 tablet (50 mg total) by mouth daily., Disp: 90 tablet, Rfl: 1   TRI-LUMA 0.01-4-0.05 % CREA, SMARTSIG:1 Sparingly Topical Every Evening, Disp: , Rfl:    Allergies  Allergen Reactions   Nabumetone Rash   Penicillins Rash    Did it involve swelling of the face/tongue/throat, SOB, or low BP? Yes Did it involve sudden or severe rash/hives, skin peeling, or any reaction on the inside of your mouth or nose? No Did you need to seek medical attention at a hospital or doctor's office? No When did it last happen? >10 years ago If all above answers are NO, may proceed with cephalosporin use.      Review of Systems  Constitutional: Negative.   Respiratory: Negative.    Cardiovascular: Negative.  Negative for chest pain and palpitations.  Gastrointestinal: Negative.   Neurological: Negative.  Negative for headaches.    Today's Vitals   02/21/22 0945  BP: 128/72  Pulse: 85  Temp: (!) 97.4 F (36.3 C)  TempSrc: Oral  Weight: 177 lb 6.4 oz (80.5 kg)  Height: '5\' 3"'  (1.6 m)   Body mass index is 31.42 kg/m.   Objective:  Physical Exam Vitals reviewed.  Constitutional:      General: She is not in acute distress.    Appearance: Normal appearance. She is well-developed. She is obese.  HENT:     Head: Normocephalic and atraumatic.     Right Ear: Hearing normal.     Left Ear: Hearing normal.     Mouth/Throat:     Comments: = Eyes:     General: Lids are normal.     Pupils: Pupils are equal, round, and reactive to light.     Funduscopic exam:    Right eye: No papilledema.        Left eye: No papilledema.  Neck:     Thyroid: No thyroid mass.      Vascular: No carotid bruit.  Cardiovascular:     Rate and Rhythm: Normal rate and regular rhythm.     Pulses: Normal pulses.     Heart sounds: Normal heart sounds. No murmur heard. Pulmonary:     Effort: Pulmonary effort is normal. No respiratory distress.     Breath sounds: Normal breath sounds. No wheezing.  Chest:     Chest wall: No mass.  Breasts:    Tanner Score is 5.     Right: Normal. No mass or tenderness.     Left: Normal. No mass or tenderness.  Musculoskeletal:     Cervical back: Full passive range  of motion without pain.     Left lower leg: No edema.  Lymphadenopathy:     Upper Body:     Right upper body: No supraclavicular, axillary or pectoral adenopathy.     Left upper body: No supraclavicular, axillary or pectoral adenopathy.  Skin:    General: Skin is warm and dry.     Capillary Refill: Capillary refill takes less than 2 seconds.  Neurological:     General: No focal deficit present.     Mental Status: She is alert and oriented to person, place, and time.     Cranial Nerves: No cranial nerve deficit.     Sensory: No sensory deficit.  Psychiatric:        Mood and Affect: Mood normal.        Behavior: Behavior normal.        Thought Content: Thought content normal.        Judgment: Judgment normal.        Assessment And Plan:     1. Type 2 diabetes mellitus without complication, without long-term current use of insulin (HCC) Comments: HgbA1c was slightly elevated, continue medications  - Hemoglobin A1c  2. Hypertensive heart disease without heart failure Comments: Blood pressure is normal, continue current medications - will try to get her Edarbi from mail order.  - BMP8+eGFR  3. Pure hypercholesterolemia Comments: Continue medications given by Cardiology  4. Encounter for immunization Shingrix given in office.  - Varicella-zoster vaccine IM     Patient was given opportunity to ask questions. Patient verbalized understanding of the plan and was  able to repeat key elements of the plan. All questions were answered to their satisfaction.  Mary Brine, FNP   I, Mary Brine, FNP, have reviewed all documentation for this visit. The documentation on 02/21/22 for the exam, diagnosis, procedures, and orders are all accurate and complete.   IF YOU HAVE BEEN REFERRED TO A SPECIALIST, IT MAY TAKE 1-2 WEEKS TO SCHEDULE/PROCESS THE REFERRAL. IF YOU HAVE NOT HEARD FROM US/SPECIALIST IN TWO WEEKS, PLEASE GIVE [place] A CALL AT [phone] X [phone].   THE PATIENT IS ENCOURAGED TO PRACTICE SOCIAL DISTANCING DUE TO THE COVID-19 PANDEMIC.

## 2022-02-21 NOTE — Patient Instructions (Signed)

## 2022-02-22 LAB — BMP8+EGFR
BUN/Creatinine Ratio: 28 (ref 12–28)
BUN: 23 mg/dL (ref 8–27)
CO2: 25 mmol/L (ref 20–29)
Calcium: 9.5 mg/dL (ref 8.7–10.3)
Chloride: 101 mmol/L (ref 96–106)
Creatinine, Ser: 0.83 mg/dL (ref 0.57–1.00)
Glucose: 138 mg/dL — ABNORMAL HIGH (ref 70–99)
Potassium: 4.5 mmol/L (ref 3.5–5.2)
Sodium: 140 mmol/L (ref 134–144)
eGFR: 80 mL/min/{1.73_m2} (ref >59)

## 2022-02-22 LAB — HEMOGLOBIN A1C
Est. average glucose Bld gHb Est-mCnc: 174 mg/dL
Hgb A1c MFr Bld: 7.7 % — ABNORMAL HIGH (ref 4.8–5.6)

## 2022-02-23 NOTE — Telephone Encounter (Signed)
February 22, 2022 ?Skeet Latch, MD ?to Via, Deliah Boston, LPN  Me   ?   2:95 PM ?Works for me.  Thank you!  ? ?TCR  ? ?Left message to call back  ?

## 2022-03-15 NOTE — Telephone Encounter (Signed)
Looks as though Wonda Horner LPN has advised patient  ?

## 2022-04-05 ENCOUNTER — Ambulatory Visit (INDEPENDENT_AMBULATORY_CARE_PROVIDER_SITE_OTHER): Payer: 59 | Admitting: Nurse Practitioner

## 2022-04-05 ENCOUNTER — Encounter: Payer: Self-pay | Admitting: Nurse Practitioner

## 2022-04-05 VITALS — BP 122/78 | HR 74 | Temp 98.5°F | Ht 63.4 in | Wt 175.0 lb

## 2022-04-05 DIAGNOSIS — G4730 Sleep apnea, unspecified: Secondary | ICD-10-CM

## 2022-04-05 DIAGNOSIS — E1159 Type 2 diabetes mellitus with other circulatory complications: Secondary | ICD-10-CM

## 2022-04-05 DIAGNOSIS — E7800 Pure hypercholesterolemia, unspecified: Secondary | ICD-10-CM

## 2022-04-05 DIAGNOSIS — Z6830 Body mass index (BMI) 30.0-30.9, adult: Secondary | ICD-10-CM

## 2022-04-05 DIAGNOSIS — I25119 Atherosclerotic heart disease of native coronary artery with unspecified angina pectoris: Secondary | ICD-10-CM | POA: Diagnosis not present

## 2022-04-05 DIAGNOSIS — Z0000 Encounter for general adult medical examination without abnormal findings: Secondary | ICD-10-CM

## 2022-04-05 DIAGNOSIS — I119 Hypertensive heart disease without heart failure: Secondary | ICD-10-CM | POA: Diagnosis not present

## 2022-04-05 DIAGNOSIS — E119 Type 2 diabetes mellitus without complications: Secondary | ICD-10-CM

## 2022-04-05 DIAGNOSIS — E6609 Other obesity due to excess calories: Secondary | ICD-10-CM

## 2022-04-05 LAB — POCT URINALYSIS DIPSTICK
Bilirubin, UA: NEGATIVE
Glucose, UA: NEGATIVE
Ketones, UA: NEGATIVE
Leukocytes, UA: NEGATIVE
Nitrite, UA: NEGATIVE
Protein, UA: NEGATIVE
Spec Grav, UA: >=1.03 — AB (ref 1.010–1.025)
Urobilinogen, UA: 0.2 E.U./dL
pH, UA: 6 (ref 5.0–8.0)

## 2022-04-05 MED ORDER — SITAGLIPTIN PHOSPHATE 100 MG PO TABS: 100.0000 mg | ORAL_TABLET | Freq: Every day | ORAL | 1 refills | Status: DC

## 2022-04-05 NOTE — Patient Instructions (Signed)

## 2022-04-05 NOTE — Progress Notes (Signed)
?Industrial/product designer as a Education administrator for [employer], FNP.,have documented all relevant documentation on the behalf of [name], FNP,as directed by  [name], FNP while in the presence of [name], [place]. ? ?This visit occurred during the SARS-CoV-2 public health emergency.  Safety protocols were in place, including screening questions prior to the visit, additional usage of staff PPE, and extensive cleaning of exam room while observing appropriate contact time as indicated for disinfecting solutions. ? ?Subjective:  ?  ? Patient ID: Mary Holt , female    DOB: March 03, 1961 , 61 y.o.   MRN: [id number] ? ? ?Chief Complaint  ?Patient presents with  ? Annual Exam  ? ? ?HPI ? ?The patient is here today for HM.  She is followed by [facility] for her GYN care.  ? ?Diabetes ?She presents for her follow-up diabetic visit. She has type 2 diabetes mellitus. Pertinent negatives for hypoglycemia include no headaches. Pertinent negatives for diabetes include no chest pain. There are no hypoglycemic complications. There are no diabetic complications. Risk factors for coronary artery disease include obesity and sedentary lifestyle. Current diabetic treatment includes oral agent (dual therapy). She has not had a previous visit with a dietitian. She participates in exercise daily (depending on weather). (Not checking her blood sugar regularly) An ACE inhibitor/angiotensin II receptor blocker is being taken. She does not see a podiatrist.Eye exam is not current.  ?Hypertension ?This is a chronic problem. The current episode started more than 1 year ago. The problem has been gradually improving since onset. The problem is controlled. Pertinent negatives include no anxiety, chest pain, headaches or palpitations. There are no associated agents to hypertension. Risk factors for coronary artery disease include dyslipidemia, sedentary lifestyle and obesity. Past treatments include angiotensin blockers. There are no compliance  problems.  There is no history of angina. There is no history of chronic renal disease.   ? ?Past Medical History:  ?Diagnosis Date  ? Angina pectoris ([place]) 02/22/2020  ? CAD in native artery 02/22/2020  ? Chest pain of uncertain etiology 04/15/5360  ? Diabetes mellitus without complication ([place])   ? Heart murmur   ? asymptomatic  ? Hyperlipidemia   ? Hypertension   ? Kidney stones   ? Kidney stones   ? several times, last  one 2014  ? Mitral valve prolapse   ? MVP (mitral valve prolapse)   ? Pure hypercholesterolemia 05/02/2020  ? Snoring 01/06/2020  ?  ? ?Family History  ?Problem Relation Age of Onset  ? Stroke Mother   ? Heart attack Father   ? Breast cancer Sister   ? Heart attack Brother   ? Ovarian cancer Sister   ? Lymphoma Sister   ? Heart attack Brother   ? Colon cancer Neg Hx   ? Colon polyps Neg Hx   ? Esophageal cancer Neg Hx   ? Rectal cancer Neg Hx   ? Stomach cancer Neg Hx   ? ? ? ?Current Outpatient Medications:  ?  aspirin EC 81 MG tablet, Take 1 tablet (81 mg total) by mouth daily., Disp: 90 tablet, Rfl: 3 ?  Azilsartan-Chlorthalidone (EDARBYCLOR) 40-12.5 MG TABS, Take 1 tablet by mouth daily., Disp: 90 tablet, Rfl: 1 ?  benzonatate (TESSALON) 100 MG capsule, Take by mouth., Disp: , Rfl:  ?  fenofibrate (TRICOR) 145 MG tablet, Take 1 tablet (145 mg total) by mouth daily., Disp: 90 tablet, Rfl: 1 ?  metFORMIN (GLUCOPHAGE XR) 500 MG 24 hr tablet, Take 1 tablet (500  mg total) by mouth daily with breakfast., Disp: 90 tablet, Rfl: 1 ?  neomycin-polymyxin-hydrocortisone (CORTISPORIN) 3.5-10000-1 OTIC suspension, Apply 1-2 drops daily after soaking and cover with bandaid, Disp: 10 mL, Rfl: 0 ?  pantoprazole (PROTONIX) 20 MG tablet, Take 1 tablet (20 mg total) by mouth daily., Disp: 30 tablet, Rfl: 4 ?  rosuvastatin (CRESTOR) 40 MG tablet, TAKE 1 TABLET BY MOUTH  DAILY AT 6 PM, Disp: 90 tablet, Rfl: 3 ?  TRI-LUMA 0.01-4-0.05 % CREA, SMARTSIG:1 Sparingly Topical Every Evening, Disp: , Rfl:  ?  sitaGLIPtin  (JANUVIA) 100 MG tablet, Take 1 tablet (100 mg total) by mouth daily., Disp: 90 tablet, Rfl: 1  ? ?Allergies  ?Allergen Reactions  ? Nabumetone Rash  ? Penicillins Rash  ?  Did it involve swelling of the face/tongue/throat, SOB, or low BP? Yes ?Did it involve sudden or severe rash/hives, skin peeling, or any reaction on the inside of your mouth or nose? No ?Did you need to seek medical attention at a hospital or doctor's office? No ?When did it last happen? >10 years ago ?If all above answers are ?NO?, may proceed with cephalosporin use. ?  ?  ? ? ?The patient states she is post menopausal status.  Patient's last menstrual period was 09/30/2014.. Negative for Dysmenorrhea and Negative for Menorrhagia. Negative for: breast discharge, breast lump(s), breast pain and breast self exam. Associated symptoms include abnormal vaginal bleeding. Pertinent negatives include abnormal bleeding (hematology), anxiety, decreased libido, depression, difficulty falling sleep, dyspareunia, history of infertility, nocturia, sexual dysfunction, sleep disturbances, urinary incontinence, urinary urgency, vaginal discharge and vaginal itching. Diet regular; she eats rotissiere chicken and fish, she does eat baked potatoes. She eats salad a lot. The patient states her exercise level is none since the weather was cool. ? ?The patient's tobacco use is:  ?Social History  ? ?Tobacco Use  ?Smoking Status Never  ?Smokeless Tobacco Never  ? ?She has been exposed to passive smoke. The patient's alcohol use is:  ?Social History  ? ?Substance and Sexual Activity  ?Alcohol Use No  ? ?Additional information: Last pap 2020 - reports having one in 2022 with a biopsy from [facility] and will sign consent to release form.    ? ?Review of Systems  ?Constitutional: Negative.   ?HENT: Negative.    ?Eyes: Negative.   ?Respiratory: Negative.    ?Cardiovascular: Negative.  Negative for chest pain and palpitations.  ?Gastrointestinal: Negative.   ?Endocrine: Negative.    ?Genitourinary: Negative.   ?Musculoskeletal: Negative.   ?Skin: Negative.   ?Allergic/Immunologic: Negative.   ?Neurological: Negative.  Negative for headaches.  ?Hematological: Negative.   ?Psychiatric/Behavioral: Negative.     ? ?Today's Vitals  ? 04/05/22 1009  ?BP: 122/78  ?Pulse: 74  ?Temp: 98.5 ?F (36.9 ?C)  ?TempSrc: Oral  ?Weight: 175 lb (79.4 kg)  ?Height: 5' 3.4" (1.61 m)  ? ?Body mass index is 30.61 kg/m?.  ? ?Objective:  ?Physical Exam ?Vitals reviewed.  ?Constitutional:   ?   General: She is not in acute distress. ?   Appearance: Normal appearance. She is well-developed. She is obese.  ?HENT:  ?   Head: Normocephalic and atraumatic.  ?   Right Ear: Hearing normal.  ?   Left Ear: Hearing normal.  ?   Mouth/Throat:  ?   Comments: = ?Eyes:  ?   General: Lids are normal.  ?   Pupils: Pupils are equal, round, and reactive to light.  ?   Funduscopic exam: ?  Right eye: No papilledema.     ?   Left eye: No papilledema.  ?Neck:  ?   Thyroid: No thyroid mass.  ?   Vascular: No carotid bruit.  ?Cardiovascular:  ?   Rate and Rhythm: Normal rate and regular rhythm.  ?   Pulses: Normal pulses.  ?   Heart sounds: Normal heart sounds. No murmur heard. ?Pulmonary:  ?   Effort: Pulmonary effort is normal. No respiratory distress.  ?   Breath sounds: Normal breath sounds. No wheezing.  ?Chest:  ?   Chest wall: No mass.  ?Breasts: ?   Tanner Score is 5.  ?   Right: Normal. No mass or tenderness.  ?   Left: Normal. No mass or tenderness.  ?Musculoskeletal:  ?   Cervical back: Full passive range of motion without pain.  ?   Left lower leg: No edema.  ?Lymphadenopathy:  ?   Upper Body:  ?   Right upper body: No supraclavicular, axillary or pectoral adenopathy.  ?   Left upper body: No supraclavicular, axillary or pectoral adenopathy.  ?Skin: ?   General: Skin is warm and dry.  ?   Capillary Refill: Capillary refill takes less than 2 seconds.  ?Neurological:  ?   General: No focal deficit present.  ?   Mental Status: She  is alert and oriented to person, place, and time.  ?   Cranial Nerves: No cranial nerve deficit.  ?   Sensory: No sensory deficit.  ?Psychiatric:     ?   Mood and Affect: Mood normal.     ?   Behavior: Behavior no

## 2022-04-07 LAB — LIPID PANEL
Chol/HDL Ratio: 3.2 ratio (ref 0.0–4.4)
Cholesterol, Total: 136 mg/dL (ref 100–199)
HDL: 42 mg/dL (ref >39)
LDL Chol Calc (NIH): 81 mg/dL (ref 0–99)
Triglycerides: 62 mg/dL (ref 0–149)
VLDL Cholesterol Cal: 13 mg/dL (ref 5–40)

## 2022-04-07 LAB — MICROALBUMIN / CREATININE URINE RATIO
Creatinine, Urine: 122.3 mg/dL
Microalb/Creat Ratio: 5 mg/g creat (ref 0–29)
Microalbumin, Urine: 6 ug/mL

## 2022-04-19 ENCOUNTER — Other Ambulatory Visit: Payer: Self-pay

## 2022-04-19 DIAGNOSIS — E7800 Pure hypercholesterolemia, unspecified: Secondary | ICD-10-CM

## 2022-04-19 LAB — HEPATIC FUNCTION PANEL
ALT: 19 IU/L (ref 0–32)
AST: 27 IU/L (ref 0–40)
Albumin: 4.4 g/dL (ref 3.8–4.8)
Alkaline Phosphatase: 72 IU/L (ref 44–121)
Bilirubin Total: <0.2 mg/dL (ref 0.0–1.2)
Bilirubin, Direct: <0.1 mg/dL (ref 0.00–0.40)
Total Protein: 7.2 g/dL (ref 6.0–8.5)

## 2022-04-19 LAB — LIPID PANEL
Chol/HDL Ratio: 3.5 ratio (ref 0.0–4.4)
Cholesterol, Total: 152 mg/dL (ref 100–199)
HDL: 44 mg/dL (ref >39)
LDL Chol Calc (NIH): 96 mg/dL (ref 0–99)
Triglycerides: 61 mg/dL (ref 0–149)
VLDL Cholesterol Cal: 12 mg/dL (ref 5–40)

## 2022-04-19 MED ORDER — EMPAGLIFLOZIN 25 MG PO TABS: 25.0000 mg | ORAL_TABLET | Freq: Every day | ORAL | 1 refills | Status: DC

## 2022-04-19 MED ORDER — CHLORTHALIDONE 25 MG PO TABS: ORAL_TABLET | ORAL | 1 refills | Status: DC

## 2022-04-19 MED ORDER — OLMESARTAN MEDOXOMIL 40 MG PO TABS: 40.0000 mg | ORAL_TABLET | Freq: Every day | ORAL | 1 refills | Status: DC

## 2022-04-20 ENCOUNTER — Other Ambulatory Visit: Payer: Self-pay

## 2022-04-20 MED ORDER — OLMESARTAN MEDOXOMIL 40 MG PO TABS: 40.0000 mg | ORAL_TABLET | Freq: Every day | ORAL | 1 refills | Status: DC

## 2022-05-15 ENCOUNTER — Ambulatory Visit (INDEPENDENT_AMBULATORY_CARE_PROVIDER_SITE_OTHER): Payer: 59

## 2022-05-15 VITALS — BP 122/64 | HR 84 | Temp 98.5°F

## 2022-05-15 DIAGNOSIS — Z23 Encounter for immunization: Secondary | ICD-10-CM

## 2022-05-15 NOTE — Progress Notes (Signed)
Patient presents today for second shingles vaccine.

## 2022-05-16 ENCOUNTER — Ambulatory Visit (INDEPENDENT_AMBULATORY_CARE_PROVIDER_SITE_OTHER): Payer: 59 | Admitting: Pharmacist

## 2022-05-16 ENCOUNTER — Encounter: Payer: Self-pay | Admitting: Pharmacist

## 2022-05-16 VITALS — BP 128/80 | HR 78 | Resp 18 | Ht 63.0 in | Wt 174.0 lb

## 2022-05-16 DIAGNOSIS — E7800 Pure hypercholesterolemia, unspecified: Secondary | ICD-10-CM

## 2022-05-16 LAB — LIPID PANEL
Chol/HDL Ratio: 3.1 ratio (ref 0.0–4.4)
Cholesterol, Total: 138 mg/dL (ref 100–199)
HDL: 44 mg/dL (ref >39)
LDL Chol Calc (NIH): 80 mg/dL (ref 0–99)
Triglycerides: 66 mg/dL (ref 0–149)
VLDL Cholesterol Cal: 14 mg/dL (ref 5–40)

## 2022-05-16 NOTE — Patient Instructions (Addendum)
It was nice meeting you today  Since you have been taking your rosuvastatin every day at 6 we can recheck your cholesterol today  If you LDL (bad cholesterol) has not come down, I will contact you and we can start the injections every 2 weeks  Please call with any questions  Karren Cobble, PharmD, [place], [place], [place], [place], [place], [address] Phone: [phone], Fax: [phone]

## 2022-05-16 NOTE — Progress Notes (Signed)
Patient ID: KAE LAUMAN                 DOB: July 20, 1961                    MRN: [id number]     HPI: Mary Holt is a 61 y.o. female patient referred to lipid clinic by Dr Oval Linsey. PMH is significant for mitral valve prolapse, HTN, CAD, angina, and T2DM.  Patient presents today in good spirits.  Was seen by [name] on 02/12/22 and there was confusion regaridng which medications she was taking.  She also had called in reporting some medications were too expensive for her. Does not know if she is taking fenofibrate currently. Knows she takes 1 tablet at 6pm but does not know name. The instructions for her rosuvastatin say to take at 6 pm so she believes this must be it.  Unclear if she has started Nexletol.  Patient is confused regarding her lipid values. Tries to exercise every morning for at least 30 minutes and typically eats a heart healthy diet. Fish, yogurt, eggs, no fast food, no sugary drinks.  Works second shift at [employer].  S/P stent in 02/26/20. Patient has T2DM, last A1c 7.7.  Current Medications:  Rosuvastatin '40mg'$  (likely taking) Fenofibrate (unclear If taking)  Risk Factors:  CAD HTN T2DM  Labs:  TC 152, Trigs 61, HDL 44, LDL 96 (04/18/22)  Past Medical History:  Diagnosis Date   Angina pectoris ([place]) 02/22/2020   CAD in native artery 02/22/2020   Chest pain of uncertain etiology 4/97/0263   Diabetes mellitus without complication ([place])    Heart murmur    asymptomatic   Hyperlipidemia    Hypertension    Kidney stones    Kidney stones    several times, last  one 2014   Mitral valve prolapse    MVP (mitral valve prolapse)    Pure hypercholesterolemia 05/02/2020   Snoring 01/06/2020    Current Outpatient Medications on File Prior to Visit  Medication Sig Dispense Refill   aspirin EC 81 MG tablet Take 1 tablet (81 mg total) by mouth daily. 90 tablet 3   benzonatate (TESSALON) 100 MG capsule Take by mouth.     chlorthalidone (HYGROTON) 25 MG tablet Take  1/2 tab by mouth with Olmesartan daily 45 tablet 1   empagliflozin (JARDIANCE) 25 MG TABS tablet Take 1 tablet (25 mg total) by mouth daily before breakfast. 90 tablet 1   fenofibrate (TRICOR) 145 MG tablet Take 1 tablet (145 mg total) by mouth daily. 90 tablet 1   metFORMIN (GLUCOPHAGE XR) 500 MG 24 hr tablet Take 1 tablet (500 mg total) by mouth daily with breakfast. 90 tablet 1   neomycin-polymyxin-hydrocortisone (CORTISPORIN) 3.5-10000-1 OTIC suspension Apply 1-2 drops daily after soaking and cover with bandaid 10 mL 0   olmesartan (BENICAR) 40 MG tablet Take 1 tablet (40 mg total) by mouth daily. 90 tablet 1   pantoprazole (PROTONIX) 20 MG tablet Take 1 tablet (20 mg total) by mouth daily. 30 tablet 4   rosuvastatin (CRESTOR) 40 MG tablet TAKE 1 TABLET BY MOUTH  DAILY AT 6 PM 90 tablet 3   TRI-LUMA 0.01-4-0.05 % CREA SMARTSIG:1 Sparingly Topical Every Evening     No current facility-administered medications on file prior to visit.    Allergies  Allergen Reactions   Nabumetone Rash   Penicillins Rash    Did it involve swelling of the face/tongue/throat, SOB, or low BP? Yes  Did it involve sudden or severe rash/hives, skin peeling, or any reaction on the inside of your mouth or nose? No Did you need to seek medical attention at a hospital or doctor's office? No When did it last happen? >10 years ago If all above answers are "NO", may proceed with cephalosporin use.     Assessment/Plan:  1. Hyperlipidemia - Patient most recent LDL 96 on 04/18/22 but unclear which medications she was taking at this time.  Patient is physically active and reports a heart healthy diet.  Now that she has been taking her rosuvastatin '40mg'$  daily, it is possible LDL has reduced.  Will have lipid panel checked today. If no significant reduction, will start PCSK9i.  Trained patient on demo pen including storage, site selection, administration, and possible adverse effects. Patient voiced understanding. Will call  tomorrow with results and decide on next steps.  Continue rosuvastatin '40mg'$  daily  Karren Cobble, PharmD, BCACP, [place], [place], [place], [place], [address] Phone: [phone], Fax: [phone]

## 2022-05-18 ENCOUNTER — Telehealth: Payer: Self-pay | Admitting: Pharmacist

## 2022-05-18 DIAGNOSIS — E7800 Pure hypercholesterolemia, unspecified: Secondary | ICD-10-CM

## 2022-05-18 DIAGNOSIS — I25119 Atherosclerotic heart disease of native coronary artery with unspecified angina pectoris: Secondary | ICD-10-CM

## 2022-05-18 NOTE — Telephone Encounter (Signed)
Spoke with patient regarding lipid results. LDL reduced from 96 in April to 80 on 5/24 since she has been on rosuvastatin '40mg'$ . Advised that since she is now taking rosuvastatin it seems to be working and she may be able to reach goal without adding additional medications. Recommended continuing rosuvastatin '40mg'$  ocne daily and rechecking lipid panel in 3 months. If LDL remains above goal, will start Repatha at that time.  Patient voiced understanding.

## 2022-05-29 ENCOUNTER — Other Ambulatory Visit: Payer: Self-pay

## 2022-05-29 DIAGNOSIS — E119 Type 2 diabetes mellitus without complications: Secondary | ICD-10-CM

## 2022-05-29 MED ORDER — METFORMIN HCL ER 500 MG PO TB24: 500.0000 mg | ORAL_TABLET | Freq: Every day | ORAL | 1 refills | Status: DC

## 2022-05-29 MED ORDER — SITAGLIPTIN PHOSPHATE 50 MG PO TABS
50.0000 mg | ORAL_TABLET | Freq: Every day | ORAL | 1 refills | Status: DC
Start: 2022-05-29 — End: 2023-02-14

## 2022-08-06 ENCOUNTER — Ambulatory Visit: Payer: 59 | Admitting: Nurse Practitioner

## 2022-09-15 ENCOUNTER — Encounter: Payer: Self-pay | Admitting: Nurse Practitioner

## 2022-10-16 ENCOUNTER — Telehealth: Payer: Self-pay

## 2022-10-16 ENCOUNTER — Encounter: Payer: Self-pay | Admitting: Nurse Practitioner

## 2022-10-16 NOTE — Telephone Encounter (Signed)
Called to schedule appt with pt-lvm. Pt has not been seen since 03/2022

## 2022-10-19 ENCOUNTER — Other Ambulatory Visit: Payer: Self-pay | Admitting: Nurse Practitioner

## 2022-10-19 DIAGNOSIS — Z1231 Encounter for screening mammogram for malignant neoplasm of breast: Secondary | ICD-10-CM

## 2022-11-02 ENCOUNTER — Ambulatory Visit: Payer: 59

## 2022-11-14 ENCOUNTER — Ambulatory Visit
Admission: RE | Admit: 2022-11-14 | Discharge: 2022-11-14 | Disposition: A | Discharge status: Home / Self Care | Payer: 59 | Source: Ambulatory Visit | Attending: Nurse Practitioner | Admitting: Nurse Practitioner

## 2022-11-14 DIAGNOSIS — Z1231 Encounter for screening mammogram for malignant neoplasm of breast: Secondary | ICD-10-CM

## 2022-12-08 ENCOUNTER — Other Ambulatory Visit: Payer: Self-pay | Admitting: Cardiovascular Disease

## 2022-12-10 NOTE — Telephone Encounter (Signed)
Rx request sent to pharmacy.  

## 2022-12-19 ENCOUNTER — Other Ambulatory Visit: Payer: Self-pay

## 2022-12-19 MED ORDER — TRI-LUMA 0.01-4-0.05 % EX CREA: TOPICAL_CREAM | CUTANEOUS | 1 refills | Status: DC

## 2022-12-19 MED ORDER — OLMESARTAN MEDOXOMIL 40 MG PO TABS: 40.0000 mg | ORAL_TABLET | Freq: Every day | ORAL | 1 refills | Status: DC

## 2022-12-19 MED ORDER — ROSUVASTATIN CALCIUM 40 MG PO TABS: ORAL_TABLET | ORAL | 3 refills | Status: DC

## 2023-01-18 ENCOUNTER — Other Ambulatory Visit: Payer: Self-pay | Admitting: Nurse Practitioner

## 2023-01-18 DIAGNOSIS — E119 Type 2 diabetes mellitus without complications: Secondary | ICD-10-CM

## 2023-02-14 ENCOUNTER — Ambulatory Visit (INDEPENDENT_AMBULATORY_CARE_PROVIDER_SITE_OTHER): Payer: PRIVATE HEALTH INSURANCE | Admitting: Nurse Practitioner

## 2023-02-14 ENCOUNTER — Encounter: Payer: Self-pay | Admitting: Nurse Practitioner

## 2023-02-14 VITALS — BP 132/82 | HR 99 | Temp 98.4°F | Ht 64.0 in | Wt 176.6 lb

## 2023-02-14 DIAGNOSIS — I119 Hypertensive heart disease without heart failure: Secondary | ICD-10-CM | POA: Diagnosis not present

## 2023-02-14 DIAGNOSIS — Z8679 Personal history of other diseases of the circulatory system: Secondary | ICD-10-CM

## 2023-02-14 DIAGNOSIS — E1169 Type 2 diabetes mellitus with other specified complication: Secondary | ICD-10-CM | POA: Diagnosis not present

## 2023-02-14 DIAGNOSIS — Z23 Encounter for immunization: Secondary | ICD-10-CM

## 2023-02-14 DIAGNOSIS — E119 Type 2 diabetes mellitus without complications: Secondary | ICD-10-CM

## 2023-02-14 DIAGNOSIS — E669 Obesity, unspecified: Secondary | ICD-10-CM

## 2023-02-14 DIAGNOSIS — Z79899 Other long term (current) drug therapy: Secondary | ICD-10-CM

## 2023-02-14 DIAGNOSIS — K219 Gastro-esophageal reflux disease without esophagitis: Secondary | ICD-10-CM

## 2023-02-14 DIAGNOSIS — I25119 Atherosclerotic heart disease of native coronary artery with unspecified angina pectoris: Secondary | ICD-10-CM

## 2023-02-14 LAB — POCT URINALYSIS DIPSTICK
Bilirubin, UA: NEGATIVE
Glucose, UA: NEGATIVE
Ketones, UA: NEGATIVE
Leukocytes, UA: NEGATIVE
Nitrite, UA: NEGATIVE
Protein, UA: NEGATIVE
Spec Grav, UA: >=1.03 — AB (ref 1.010–1.025)
Urobilinogen, UA: 0.2 E.U./dL
pH, UA: 6 (ref 5.0–8.0)

## 2023-02-14 MED ORDER — PANTOPRAZOLE SODIUM 20 MG PO TBEC: 20.0000 mg | DELAYED_RELEASE_TABLET | Freq: Every day | ORAL | 4 refills | Status: DC

## 2023-02-14 MED ORDER — EMPAGLIFLOZIN 25 MG PO TABS: 25.0000 mg | ORAL_TABLET | Freq: Every day | ORAL | 1 refills | Status: DC

## 2023-02-14 MED ORDER — MOMETASONE FUROATE 0.1 % EX CREA: 1.0000 | TOPICAL_CREAM | Freq: Every day | CUTANEOUS | 1 refills | Status: DC

## 2023-02-14 NOTE — Progress Notes (Unsigned)
I,[name],acting as a Education administrator for [name], FNP.,have documented all relevant documentation on the behalf of [name], FNP,as directed by  [name], FNP while in the presence of [name], [place].    Subjective:     Patient ID: Mary Holt , female    DOB: [birth year] , 62 y.o.   MRN: [id number]   Chief Complaint  Patient presents with   Hypertension    HPI  Patient presents today for htn follow up. She has recently changed health insurance. She has not been seen since April 2023, in the adherence record she has not picked up the jardiance. She is not getting any standard exercise in on a regular.   Patient has questions and concerns regarding Metformin. Patient has no other complaints or concerns.     Hypertension This is a chronic problem. The current episode started more than 1 year ago. The problem has been gradually improving since onset. The problem is controlled. Pertinent negatives include no anxiety, chest pain, headaches or palpitations. There are no associated agents to hypertension. Risk factors for coronary artery disease include dyslipidemia, sedentary lifestyle and obesity. Past treatments include angiotensin blockers. There are no compliance problems.  There is no history of angina. There is no history of chronic renal disease.  Diabetes She presents for her follow-up diabetic visit. She has type 2 diabetes mellitus. Pertinent negatives for hypoglycemia include no headaches. Pertinent negatives for diabetes include no chest pain. There are no hypoglycemic complications. There are no diabetic complications. Risk factors for coronary artery disease include obesity and sedentary lifestyle. Current diabetic treatment includes oral agent (dual therapy). She has not had a previous visit with a dietitian. She participates in exercise daily (depending on weather). (Not checking her blood sugar regularly) An ACE inhibitor/angiotensin II receptor blocker is  being taken. She does not see a podiatrist.Eye exam is not current.     Past Medical History:  Diagnosis Date   Angina pectoris ([place]) 02/22/2020   CAD in native artery 02/22/2020   Chest pain of uncertain etiology [id number]   Diabetes mellitus without complication (HCC)    Heart murmur    asymptomatic   Hyperlipidemia    Hypertension    Kidney stones    Kidney stones    several times, last  one 2014   Mitral valve prolapse    MVP (mitral valve prolapse)    Pure hypercholesterolemia 05/02/2020   Snoring 01/06/2020     Family History  Problem Relation Age of Onset   Stroke Mother    Heart attack Father    Breast cancer Sister    Heart attack Brother    Ovarian cancer Sister    Lymphoma Sister    Heart attack Brother    Colon cancer Neg Hx    Colon polyps Neg Hx    Esophageal cancer Neg Hx    Rectal cancer Neg Hx    Stomach cancer Neg Hx      Current Outpatient Medications:    Bempedoic Acid (NEXLETOL) 180 MG TABS, Take 1 tablet by mouth daily., Disp: , Rfl:    Blood Glucose Monitoring Suppl DEVI, 1 each by Does not apply route in the morning, at noon, and at bedtime. May substitute to any manufacturer covered by patient's insurance., Disp: 1 each, Rfl: 0   fenofibrate (TRICOR) 145 MG tablet, TAKE 1 TABLET(145 MG) BY MOUTH DAILY, Disp: 90 tablet, Rfl: 1   Glucose Blood (BLOOD GLUCOSE TEST STRIPS) STRP, 1 each by  Other route in the morning, at noon, and at bedtime. May substitute to any manufacturer covered by patient's insurance., Disp: 100 strip, Rfl: 3   Lancets Misc. MISC, 1 each by Does not apply route in the morning, at noon, and at bedtime. May substitute to any manufacturer covered by patient's insurance., Disp: 100 each, Rfl: 3   olmesartan (BENICAR) 40 MG tablet, Take 1 tablet (40 mg total) by mouth daily., Disp: 90 tablet, Rfl: 1   rosuvastatin (CRESTOR) 40 MG tablet, TAKE 1 TABLET BY MOUTH  DAILY AT 6 PM, Disp: 90 tablet, Rfl: 3   TRI-LUMA 0.01-4-0.05 % CREA,  SMARTSIG:1 Sparingly Topical Every Evening, Disp: 30 g, Rfl: 1   aspirin EC 81 MG tablet, Take 1 tablet (81 mg total) by mouth daily., Disp: 90 tablet, Rfl: 3   blood glucose meter kit and supplies KIT, Dispense based on patient and insurance preference. Use up to four times daily as directed., Disp: 1 each, Rfl: 0   empagliflozin (JARDIANCE) 25 MG TABS tablet, Take 1 tablet (25 mg total) by mouth daily before breakfast., Disp: 90 tablet, Rfl: 1   mometasone (ELOCON) 0.1 % cream, Apply 1 Application topically daily., Disp: 45 g, Rfl: 1   pantoprazole (PROTONIX) 20 MG tablet, Take 1 tablet (20 mg total) by mouth daily., Disp: 30 tablet, Rfl: 4   Allergies  Allergen Reactions   Nabumetone Rash   Penicillins Rash    Did it involve swelling of the face/tongue/throat, SOB, or low BP? Yes Did it involve sudden or severe rash/hives, skin peeling, or any reaction on the inside of your mouth or nose? No Did you need to seek medical attention at a hospital or doctor's office? No When did it last happen? >10 years ago If all above answers are "NO", may proceed with cephalosporin use.      Review of Systems  Constitutional: Negative.   Respiratory: Negative.    Cardiovascular:  Negative for chest pain, palpitations and leg swelling.  Neurological: Negative.  Negative for headaches.  Psychiatric/Behavioral: Negative.    All other systems reviewed and are negative.    Today's Vitals   02/14/23 1000  BP: 132/82  Pulse: 99  Temp: 98.4 F (36.9 C)  TempSrc: Oral  SpO2: 98%  Weight: 176 lb 9.6 oz (80.1 kg)  Height: '5\' 4"'$  (1.626 m)   Body mass index is 30.31 kg/m.   Objective:  Physical Exam Vitals reviewed.  Constitutional:      General: She is not in acute distress.    Appearance: Normal appearance. She is well-developed. She is obese.  HENT:     Head: Normocephalic and atraumatic.     Right Ear: Hearing normal.     Left Ear: Hearing normal.  Eyes:     General: Lids are normal.      Pupils: Pupils are equal, round, and reactive to light.     Funduscopic exam:    Right eye: No papilledema.        Left eye: No papilledema.  Neck:     Thyroid: No thyroid mass.     Vascular: No carotid bruit.  Cardiovascular:     Rate and Rhythm: Normal rate and regular rhythm.     Pulses: Normal pulses.     Heart sounds: Normal heart sounds. No murmur heard. Pulmonary:     Effort: Pulmonary effort is normal. No respiratory distress.     Breath sounds: Normal breath sounds. No wheezing.  Musculoskeletal:     Cervical  back: Full passive range of motion without pain.     Left lower leg: No edema.  Skin:    General: Skin is warm and dry.     Capillary Refill: Capillary refill takes less than 2 seconds.  Neurological:     General: No focal deficit present.     Mental Status: She is alert and oriented to person, place, and time.     Cranial Nerves: No cranial nerve deficit.     Sensory: No sensory deficit.  Psychiatric:        Mood and Affect: Mood normal.        Behavior: Behavior normal.        Thought Content: Thought content normal.        Judgment: Judgment normal.         Assessment And Plan:     1. Diabetes mellitus type 2 in obese [facility]) Comments: HgbA1c not well controlled, she has not been on her medications regularly due to cost of medications. Advised to focus on adherence. Also discussed concerns about metformin. Discussed risk of complications with poorly controlled diabetes - empagliflozin (JARDIANCE) 25 MG TABS tablet; Take 1 tablet (25 mg total) by mouth daily before breakfast.  Dispense: 90 tablet; Refill: 1 - Microalbumin / Creatinine Urine Ratio - CMP14 + Anion Gap - Hemoglobin A1c - Lipid panel - POCT Urinalysis Dipstick (81002)  2. Hypertensive heart disease without heart failure Comments: Blood pressure is fairly controlled, advised to continue medications and she is to follow up with Cardiology - CMP14 + Anion Gap  3. Coronary artery disease  involving native coronary artery of native heart with angina pectoris ([place]) Comments: Continue statin, tolerating well  4. History of mitral valve prolapse  5. Gastroesophageal reflux disease without esophagitis Comments: Discussed importance of avoiding food triggers. - pantoprazole (PROTONIX) 20 MG tablet; Take 1 tablet (20 mg total) by mouth daily.  Dispense: 30 tablet; Refill: 4  6. Other long term (current) drug therapy - TSH    Patient was given opportunity to ask questions. Patient verbalized understanding of the plan and was able to repeat key elements of the plan. All questions were answered to their satisfaction.  [name], FNP   I, [name], FNP, have reviewed all documentation for this visit. The documentation on 02/14/23 for the exam, diagnosis, procedures, and orders are all accurate and complete.   IF YOU HAVE BEEN REFERRED TO A SPECIALIST, IT MAY TAKE 1-2 WEEKS TO SCHEDULE/PROCESS THE REFERRAL. IF YOU HAVE NOT HEARD FROM US/SPECIALIST IN TWO WEEKS, PLEASE GIVE [place] A CALL AT [phone] X [phone].   THE PATIENT IS ENCOURAGED TO PRACTICE SOCIAL DISTANCING DUE TO THE COVID-19 PANDEMIC.

## 2023-02-15 ENCOUNTER — Other Ambulatory Visit: Payer: Self-pay

## 2023-02-15 DIAGNOSIS — E669 Obesity, unspecified: Secondary | ICD-10-CM

## 2023-02-15 LAB — CMP14 + ANION GAP
ALT: 20 IU/L (ref 0–32)
AST: 19 IU/L (ref 0–40)
Albumin/Globulin Ratio: 1.8 (ref 1.2–2.2)
Albumin: 4.8 g/dL (ref 3.9–4.9)
Alkaline Phosphatase: 95 IU/L (ref 44–121)
Anion Gap: 17 mmol/L (ref 10.0–18.0)
BUN/Creatinine Ratio: 18 (ref 12–28)
BUN: 14 mg/dL (ref 8–27)
Bilirubin Total: 0.3 mg/dL (ref 0.0–1.2)
CO2: 23 mmol/L (ref 20–29)
Calcium: 10.3 mg/dL (ref 8.7–10.3)
Chloride: 104 mmol/L (ref 96–106)
Creatinine, Ser: 0.76 mg/dL (ref 0.57–1.00)
Globulin, Total: 2.6 g/dL (ref 1.5–4.5)
Glucose: 181 mg/dL — ABNORMAL HIGH (ref 70–99)
Potassium: 4.6 mmol/L (ref 3.5–5.2)
Sodium: 144 mmol/L (ref 134–144)
Total Protein: 7.4 g/dL (ref 6.0–8.5)
eGFR: 89 mL/min/{1.73_m2} (ref >59)

## 2023-02-15 LAB — LIPID PANEL
Chol/HDL Ratio: 2.5 ratio (ref 0.0–4.4)
Cholesterol, Total: 122 mg/dL (ref 100–199)
HDL: 48 mg/dL (ref >39)
LDL Chol Calc (NIH): 62 mg/dL (ref 0–99)
Triglycerides: 55 mg/dL (ref 0–149)
VLDL Cholesterol Cal: 12 mg/dL (ref 5–40)

## 2023-02-15 LAB — MICROALBUMIN / CREATININE URINE RATIO
Creatinine, Urine: 153.6 mg/dL
Microalb/Creat Ratio: 8 mg/g creat (ref 0–29)
Microalbumin, Urine: 12 ug/mL

## 2023-02-15 LAB — TSH: TSH: 1.13 u[IU]/mL (ref 0.450–4.500)

## 2023-02-15 LAB — HEMOGLOBIN A1C
Est. average glucose Bld gHb Est-mCnc: 209 mg/dL
Hgb A1c MFr Bld: 8.9 % — ABNORMAL HIGH (ref 4.8–5.6)

## 2023-02-15 MED ORDER — BLOOD GLUCOSE MONITOR KIT: PACK | 0 refills | Status: AC

## 2023-02-15 MED ORDER — BLOOD GLUCOSE MONITOR KIT: PACK | 0 refills | Status: DC

## 2023-02-15 MED ORDER — BLOOD GLUCOSE MONITORING SUPPL DEVI: 1.0000 | Freq: Three times a day (TID) | 0 refills | Status: AC

## 2023-02-15 MED ORDER — LANCETS MISC. MISC: 1.0000 | Freq: Three times a day (TID) | 3 refills | Status: AC

## 2023-02-15 MED ORDER — BLOOD GLUCOSE TEST VI STRP: 1.0000 | ORAL_STRIP | Freq: Three times a day (TID) | 3 refills | Status: AC

## 2023-02-26 ENCOUNTER — Ambulatory Visit (INDEPENDENT_AMBULATORY_CARE_PROVIDER_SITE_OTHER): Payer: PRIVATE HEALTH INSURANCE

## 2023-02-26 VITALS — BP 130/68 | HR 78 | Temp 98.4°F | Ht 64.0 in | Wt 176.0 lb

## 2023-02-26 DIAGNOSIS — Z23 Encounter for immunization: Secondary | ICD-10-CM

## 2023-02-26 NOTE — Progress Notes (Signed)
Patient presents today for covid vaccine. Patient waited 15 mins. Patient reports feeling great.

## 2023-02-26 NOTE — Patient Instructions (Signed)
SARS-CoV-2 Virus (COVID-19) Vaccine Injection (Pfizer-BioNTech) What is this medication? COVID-19 VACCINE (koh-vid 19 vak SEEN) reduces the risk of COVID-19. It does not treat COVID-19. It is still possible to get COVID-19 after receiving this vaccine, but the symptoms may be less severe or not last as long. It works by helping your immune system learn how to fight off a future infection. This medicine may be used for other purposes; ask your health care provider or pharmacist if you have questions. COMMON BRAND NAME(S): COMIRNATY COVID-19, Pfizer-BioNTech COVID-19, Pfizer-BioNTech COVID-19 Bivalent What should I tell my care team before I take this medication? They need to know if you have any of these conditions: Any allergies Bleeding disorder Fever or infection Immune system problems Recent or upcoming vaccine including previous COVID-19 vaccine An unusual or allergic reaction to COVID-19 vaccine, other medications, foods, dyes, or preservatives Pregnant or trying to get pregnant Breast-feeding How should I use this medication? This vaccine is injected into a muscle. It is given by your care team. A copy of the Fact Sheet for Recipients and Caregivers will be given before each vaccination. Be sure to read this information carefully each time. This sheet may change frequently. Talk to your care team about the use of this vaccine in children. While it may be given to children as young as 6 months for selected conditions, precautions do apply. Overdosage: If you think you have taken too much of this medicine contact a poison control center or emergency room at once. NOTE: This medicine is only for you. Do not share this medicine with others. What if I miss a dose? It is important not to miss your dose. Call your care team if you are unable to keep an appointment. What may interact with this medication? This medication may interact with the following: Certain medications that thin your  blood Chemotherapy or radiation therapy Medications that lower your chance of fighting infection Steroid medications, such as prednisone or cortisone This list may not describe all possible interactions. Give your health care provider a list of all the medicines, herbs, non-prescription drugs, or dietary supplements you use. Also tell them if you smoke, drink alcohol, or use illegal drugs. Some items may interact with your medicine. What should I watch for while using this medication? Visit your care team regularly. Heart muscle inflammation has been reported after receiving this vaccine. It is not known whether the vaccine causes the heart inflammation. Talk to your care team right away if you have unusual weakness or fatigue, shortness of breath, chest pain, fast or irregular heartbeat, dizziness, or swelling of the ankles, feet, or hands. The Centers for Disease Control (CDC) is monitoring these reports to see if there is any relationship to this vaccine. This vaccine, like all vaccines, may not fully protect everyone. Continue to follow all guidelines to prevent exposure. What side effects may I notice from receiving this medication? Side effects that you should report to your care team as soon as possible: Allergic reactions--skin rash, itching, hives, swelling of the face, lips, tongue, or throat Heart muscle inflammation--unusual weakness or fatigue, shortness of breath, chest pain, fast or irregular heartbeat, dizziness, swelling of the ankles, feet, or hands Side effects that usually do not require medical attention (report these to your care team if they continue or are bothersome): Chills Fatigue Fever Headache Joint pain Muscle pain Nausea Pain, redness, or irritation at injection site Swollen lymph nodes Vomiting This list may not describe all possible side effects. Call your  doctor for medical advice about side effects. You may report side effects to FDA at  1-800-FDA-1088. Where should I keep my medication? This vaccine is only given by your care team. It will not be stored at home. NOTE: This sheet is a summary. It may not cover all possible information. If you have questions about this medicine, talk to your doctor, pharmacist, or health care provider.  2023 Elsevier/Gold Standard (2021-07-14 00:00:00)

## 2023-04-16 ENCOUNTER — Ambulatory Visit (HOSPITAL_BASED_OUTPATIENT_CLINIC_OR_DEPARTMENT_OTHER): Payer: 59 | Admitting: Family

## 2023-04-16 ENCOUNTER — Ambulatory Visit: Payer: PRIVATE HEALTH INSURANCE | Admitting: Nurse Practitioner

## 2023-04-24 NOTE — Progress Notes (Signed)
NO SHOW

## 2023-04-25 ENCOUNTER — Encounter: Payer: PRIVATE HEALTH INSURANCE | Admitting: Nurse Practitioner

## 2023-04-25 DIAGNOSIS — I119 Hypertensive heart disease without heart failure: Secondary | ICD-10-CM

## 2023-04-25 DIAGNOSIS — E1159 Type 2 diabetes mellitus with other circulatory complications: Secondary | ICD-10-CM

## 2023-04-25 DIAGNOSIS — E7800 Pure hypercholesterolemia, unspecified: Secondary | ICD-10-CM

## 2023-06-10 ENCOUNTER — Encounter: Payer: PRIVATE HEALTH INSURANCE | Admitting: Nurse Practitioner

## 2023-06-10 NOTE — Progress Notes (Deleted)
Hershal Coria Shatera Rennert,acting as a Neurosurgeon for [name], FNP.,have documented all relevant documentation on the behalf of [name], FNP,as directed by  [name], FNP while in the presence of [name], FNP.  Subjective:    Patient ID: Mary Holt , female    DOB: 22-Feb-1961 , 62 y.o.   MRN: [id number]  No chief complaint on file.   HPI  Patient presents today for HM, patient reports compliance with medications and has no other concerns today. Patient denies any chest pain, SOB, and headaches.      Past Medical History:  Diagnosis Date   Angina pectoris (HCC) 02/22/2020   CAD in native artery 02/22/2020   Chest pain of uncertain etiology 01/06/2020   Diabetes mellitus without complication (HCC)    Heart murmur    asymptomatic   Hyperlipidemia    Hypertension    Kidney stones    Kidney stones    several times, last  one 2014   Mitral valve prolapse    MVP (mitral valve prolapse)    Pure hypercholesterolemia 05/02/2020   Snoring 01/06/2020     Family History  Problem Relation Age of Onset   Stroke Mother    Heart attack Father    Breast cancer Sister    Heart attack Brother    Ovarian cancer Sister    Lymphoma Sister    Heart attack Brother    Colon cancer Neg Hx    Colon polyps Neg Hx    Esophageal cancer Neg Hx    Rectal cancer Neg Hx    Stomach cancer Neg Hx      Current Outpatient Medications:    aspirin EC 81 MG tablet, Take 1 tablet (81 mg total) by mouth daily., Disp: 90 tablet, Rfl: 3   Bempedoic Acid (NEXLETOL) 180 MG TABS, Take 1 tablet by mouth daily., Disp: , Rfl:    blood glucose meter kit and supplies KIT, Dispense based on patient and insurance preference. Use up to four times daily as directed., Disp: 1 each, Rfl: 0   Blood Glucose Monitoring Suppl DEVI, 1 each by Does not apply route in the morning, at noon, and at bedtime. May substitute to any manufacturer covered by patient's insurance., Disp: 1 each, Rfl: 0   empagliflozin (JARDIANCE) 25  MG TABS tablet, Take 1 tablet (25 mg total) by mouth daily before breakfast., Disp: 90 tablet, Rfl: 1   fenofibrate (TRICOR) 145 MG tablet, TAKE 1 TABLET(145 MG) BY MOUTH DAILY, Disp: 90 tablet, Rfl: 1   mometasone (ELOCON) 0.1 % cream, Apply 1 Application topically daily., Disp: 45 g, Rfl: 1   olmesartan (BENICAR) 40 MG tablet, Take 1 tablet (40 mg total) by mouth daily., Disp: 90 tablet, Rfl: 1   pantoprazole (PROTONIX) 20 MG tablet, Take 1 tablet (20 mg total) by mouth daily., Disp: 30 tablet, Rfl: 4   rosuvastatin (CRESTOR) 40 MG tablet, TAKE 1 TABLET BY MOUTH  DAILY AT 6 PM, Disp: 90 tablet, Rfl: 3   TRI-LUMA 0.01-4-0.05 % CREA, SMARTSIG:1 Sparingly Topical Every Evening, Disp: 30 g, Rfl: 1   Allergies  Allergen Reactions   Nabumetone Rash   Penicillins Rash    Did it involve swelling of the face/tongue/throat, SOB, or low BP? Yes Did it involve sudden or severe rash/hives, skin peeling, or any reaction on the inside of your mouth or nose? No Did you need to seek medical attention at a hospital or doctor's office? No When did it last happen? >10 years  ago If all above answers are "NO", may proceed with cephalosporin use.       The patient states she uses {contraceptive methods:5051} for birth control. Patient's last menstrual period was 09/30/2014.Marland Kitchen {Dysmenorrhea-menorrhagia:21918}. Negative for: breast discharge, breast lump(s), breast pain and breast self exam. Associated symptoms include abnormal vaginal bleeding. Pertinent negatives include abnormal bleeding (hematology), anxiety, decreased libido, depression, difficulty falling sleep, dyspareunia, history of infertility, nocturia, sexual dysfunction, sleep disturbances, urinary incontinence, urinary urgency, vaginal discharge and vaginal itching. Diet regular.The patient states her exercise level is    . The patient's tobacco use is:  Social History   Tobacco Use  Smoking Status Never  Smokeless Tobacco Never  . She has been  exposed to passive smoke. The patient's alcohol use is:  Social History   Substance and Sexual Activity  Alcohol Use No  . Additional information: Last pap ***, next one scheduled for ***.    Review of Systems   There were no vitals filed for this visit. There is no height or weight on file to calculate BMI.  Wt Readings from Last 3 Encounters:  02/26/23 176 lb (79.8 kg)  02/14/23 176 lb 9.6 oz (80.1 kg)  05/16/22 174 lb (78.9 kg)     Objective:  Physical Exam      Assessment And Plan:     1. Encounter for annual health examination  2. Essential hypertension  3. Type 2 diabetes mellitus without complication, without long-term current use of insulin (HCC)  4. Pure hypercholesterolemia    No follow-ups on file. Patient was given opportunity to ask questions. Patient verbalized understanding of the plan and was able to repeat key elements of the plan. All questions were answered to their satisfaction.   [name], FNP  I, [name], FNP, have reviewed all documentation for this visit. The documentation on 06/10/23 for the exam, diagnosis, procedures, and orders are all accurate and complete.

## 2023-06-29 ENCOUNTER — Other Ambulatory Visit: Payer: Self-pay | Admitting: Cardiovascular Disease

## 2023-07-01 NOTE — Telephone Encounter (Signed)
Please call pt to schedule overdue follow-up with Dr. Duke Salvia or APP for refills. Last OV 01/2022. Thank you!

## 2023-07-02 ENCOUNTER — Telehealth (HOSPITAL_BASED_OUTPATIENT_CLINIC_OR_DEPARTMENT_OTHER): Payer: Self-pay | Admitting: *Deleted

## 2023-07-02 NOTE — Telephone Encounter (Signed)
Rx request sent to pharmacy.  

## 2023-07-02 NOTE — Telephone Encounter (Signed)
Patient would like to switch cardiac care from Dr. Duke Salvia to Dr. Servando Salina.  Patient prefers [facility] location.

## 2023-07-02 NOTE — Telephone Encounter (Signed)
Patient wants to switch cardiac care from Dr. Duke Salvia to Dr. Harriette Ohara note sent to both providers

## 2023-07-24 NOTE — Telephone Encounter (Signed)
Received ok from Dr. Duke Salvia and Dr. Servando Salina for patient to change cardiac care providers Duke Salvia to [place])---Left message for patient to call and schedule overdue follow up for medication refills

## 2023-07-26 NOTE — Telephone Encounter (Signed)
Left message for patient to call and schedule overdue follow up with Dr. Corky Sing is switching care from Dr. Derry Skill 07/02/23 phone note with approvals from the providers

## 2023-07-29 ENCOUNTER — Other Ambulatory Visit: Payer: Self-pay

## 2023-07-29 ENCOUNTER — Telehealth: Payer: Self-pay | Admitting: Cardiology

## 2023-07-29 MED ORDER — OLMESARTAN MEDOXOMIL 40 MG PO TABS: 40.0000 mg | ORAL_TABLET | Freq: Every day | ORAL | 1 refills | Status: DC

## 2023-07-29 NOTE — Telephone Encounter (Signed)
Pt c/o of Chest Pain: STAT if CP now or developed within 24 hours  1. Are you having CP right now?  No   2. Are you experiencing any other symptoms (ex. SOB, nausea, vomiting, sweating)?  No   3. How long have you been experiencing CP?  Past few weeks  4. Is your CP continuous or coming and going?  Coming and going   5. Have you taken Nitroglycerin?  No  ?

## 2023-07-29 NOTE — Telephone Encounter (Signed)
Follow Up:     Patientis returning a call from this morning. 

## 2023-07-29 NOTE — Telephone Encounter (Signed)
Patient states she works second shift and leaves her house at 2:00 PM. She would like a call back prior to that if possible.

## 2023-07-29 NOTE — Telephone Encounter (Signed)
Was on the phone with patient and while she was talking phone disconnected.   Tried to call back x4. Kept getting a busy signal.

## 2023-07-29 NOTE — Telephone Encounter (Signed)
Call to patient and LVM to call office

## 2023-07-30 ENCOUNTER — Encounter (HOSPITAL_COMMUNITY): Payer: Self-pay

## 2023-07-30 ENCOUNTER — Emergency Department (HOSPITAL_COMMUNITY): Payer: PRIVATE HEALTH INSURANCE

## 2023-07-30 ENCOUNTER — Encounter (HOSPITAL_BASED_OUTPATIENT_CLINIC_OR_DEPARTMENT_OTHER): Payer: Self-pay | Admitting: Cardiovascular Disease

## 2023-07-30 ENCOUNTER — Other Ambulatory Visit: Payer: Self-pay

## 2023-07-30 ENCOUNTER — Emergency Department (HOSPITAL_COMMUNITY)
Admission: EM | Admit: 2023-07-30 | Discharge: 2023-07-30 | Disposition: A | Discharge status: Home / Self Care | Payer: PRIVATE HEALTH INSURANCE | Attending: Emergency Medicine | Admitting: Emergency Medicine

## 2023-07-30 DIAGNOSIS — R079 Chest pain, unspecified: Secondary | ICD-10-CM | POA: Diagnosis present

## 2023-07-30 DIAGNOSIS — E119 Type 2 diabetes mellitus without complications: Secondary | ICD-10-CM | POA: Diagnosis not present

## 2023-07-30 DIAGNOSIS — Z79899 Other long term (current) drug therapy: Secondary | ICD-10-CM | POA: Diagnosis not present

## 2023-07-30 DIAGNOSIS — I2510 Atherosclerotic heart disease of native coronary artery without angina pectoris: Secondary | ICD-10-CM | POA: Insufficient documentation

## 2023-07-30 DIAGNOSIS — I10 Essential (primary) hypertension: Secondary | ICD-10-CM | POA: Diagnosis not present

## 2023-07-30 DIAGNOSIS — Z7982 Long term (current) use of aspirin: Secondary | ICD-10-CM | POA: Insufficient documentation

## 2023-07-30 LAB — COMPREHENSIVE METABOLIC PANEL
ALT: 18 U/L (ref 0–44)
AST: 21 U/L (ref 15–41)
Albumin: 4.1 g/dL (ref 3.5–5.0)
Alkaline Phosphatase: 59 U/L (ref 38–126)
Anion gap: 11 (ref 5–15)
BUN: 15 mg/dL (ref 8–23)
CO2: 22 mmol/L (ref 22–32)
Calcium: 9.5 mg/dL (ref 8.9–10.3)
Chloride: 107 mmol/L (ref 98–111)
Creatinine, Ser: 0.85 mg/dL (ref 0.44–1.00)
GFR, Estimated: >60 mL/min (ref >60)
Glucose, Bld: 126 mg/dL — ABNORMAL HIGH (ref 70–99)
Potassium: 3.7 mmol/L (ref 3.5–5.1)
Sodium: 140 mmol/L (ref 135–145)
Total Bilirubin: 0.6 mg/dL (ref 0.3–1.2)
Total Protein: 7.5 g/dL (ref 6.5–8.1)

## 2023-07-30 LAB — CBC WITH DIFFERENTIAL/PLATELET
Abs Immature Granulocytes: 0.01 10*3/uL (ref 0.00–0.07)
Basophils Absolute: 0.1 10*3/uL (ref 0.0–0.1)
Basophils Relative: 1 %
Eosinophils Absolute: 0.2 10*3/uL (ref 0.0–0.5)
Eosinophils Relative: 2 %
HCT: 42 % (ref 36.0–46.0)
Hemoglobin: 13 g/dL (ref 12.0–15.0)
Immature Granulocytes: 0 %
Lymphocytes Relative: 35 %
Lymphs Abs: 2.5 10*3/uL (ref 0.7–4.0)
MCH: 27 pg (ref 26.0–34.0)
MCHC: 31 g/dL (ref 30.0–36.0)
MCV: 87.1 fL (ref 80.0–100.0)
Monocytes Absolute: 0.4 10*3/uL (ref 0.1–1.0)
Monocytes Relative: 6 %
Neutro Abs: 3.9 10*3/uL (ref 1.7–7.7)
Neutrophils Relative %: 56 %
Platelets: 340 10*3/uL (ref 150–400)
RBC: 4.82 MIL/uL (ref 3.87–5.11)
RDW: 13.5 % (ref 11.5–15.5)
WBC: 6.9 10*3/uL (ref 4.0–10.5)
nRBC: 0 % (ref 0.0–0.2)

## 2023-07-30 LAB — TROPONIN I (HIGH SENSITIVITY)
Troponin I (High Sensitivity): 3 ng/L (ref <18)
Troponin I (High Sensitivity): 3 ng/L (ref <18)

## 2023-07-30 LAB — D-DIMER, QUANTITATIVE: D-Dimer, Quant: 0.28 ug/mL-FEU (ref 0.00–0.50)

## 2023-07-30 MED ORDER — OLMESARTAN MEDOXOMIL 40 MG PO TABS: 40.0000 mg | ORAL_TABLET | Freq: Every day | ORAL | 1 refills | Status: DC

## 2023-07-30 MED ORDER — ASPIRIN 81 MG PO CHEW
243.0000 mg | CHEWABLE_TABLET | Freq: Once | ORAL | Status: AC
  Administered 2023-07-30: 243 mg via ORAL
  Filled 2023-07-30: qty 3

## 2023-07-30 NOTE — Telephone Encounter (Signed)
Left message for patient to call and schedule her foverdue follow up with Dr. Servando Salina (patient is changing providers from Dr. Duke Salvia to Dr. Servando Salina) (see 07/18/23 phone note)  Will also mail letter requesting she call to schedule

## 2023-07-30 NOTE — Telephone Encounter (Signed)
Please see phone note in EPIC.

## 2023-07-30 NOTE — Telephone Encounter (Signed)
Returned a call in regards to her chest pain. Pt states she has a lot of pressure in her chest and it is very uncomfortable. Pt states this is the same symptom she had that caused her to have stents placed. Pt does not have a way at home to check BP and did not check her HR. Advised pt to go to the ER as they can do more for her there. Pt verbalized understanding.

## 2023-07-30 NOTE — ED Provider Notes (Signed)
[facility] [facility] DEPARTMENT AT [facility] Provider Note   CSN: [id number] Arrival date & time: 07/30/23  1040     History  Chief Complaint  Patient presents with   Chest Pain    Mary Holt is a 62 y.o. female.  With past medical history of hypercholesterolemia, CAD s/p stent placement, diabetes, hypertension, mitral valve prolapse who presents to the emergency department with chest pain.  Patient states that she is having central, nonradiating chest pain that she describes as a heaviness or something sitting on her chest.  The pain has been on and off over the past few weeks, however it has been increasing in frequency and it has been every day over the past few days.  It has been all day over the past few days.  There are no aggravating or alleviating factors.  The pain feels similar to previous pain when she needed stent placement.  She denies having any nausea diaphoresis with her symptoms.  She denies having shortness of breath or palpitations.  No recent cough or fevers.    Chest Pain      Home Medications Prior to Admission medications   Medication Sig Start Date End Date Taking? Authorizing Provider  aspirin EC 81 MG tablet Take 1 tablet (81 mg total) by mouth daily. 02/16/20  Yes Chilton Si, MD  empagliflozin (JARDIANCE) 25 MG TABS tablet Take 1 tablet (25 mg total) by mouth daily before breakfast. 02/14/23  Yes [name], FNP  fenofibrate (TRICOR) 145 MG tablet TAKE 1 TABLET(145 MG) BY MOUTH DAILY 07/02/23  Yes Chilton Si, MD  rosuvastatin (CRESTOR) 40 MG tablet TAKE 1 TABLET BY MOUTH  DAILY AT 6 PM Patient taking differently: Take 40 mg by mouth daily. 12/19/22  Yes [name], FNP  blood glucose meter kit and supplies KIT Dispense based on patient and insurance preference. Use up to four times daily as directed. 02/15/23   [name], FNP  Blood Glucose Monitoring Suppl DEVI 1 each by Does not apply route in the morning, at noon, and  at bedtime. May substitute to any manufacturer covered by patient's insurance. 02/15/23   [name], FNP  olmesartan (BENICAR) 40 MG tablet Take 1 tablet (40 mg total) by mouth daily. 07/30/23   Cristopher Peru, PA-C      Allergies    Relafen [nabumetone] and Penicillins    Review of Systems   Review of Systems  Cardiovascular:  Positive for chest pain.  All other systems reviewed and are negative.   Physical Exam Updated Vital Signs BP 133/78 (BP Location: Right Arm)   Pulse 69   Temp (!) 97.4 F (36.3 C) (Oral)   Resp 11   Ht 5\' 4"  (1.626 m)   Wt 79 kg   LMP 09/30/2014   SpO2 98%   BMI 29.90 kg/m  Physical Exam Vitals and nursing note reviewed.  Constitutional:      General: She is not in acute distress.    Appearance: Normal appearance. She is well-developed and normal weight. She is not ill-appearing.  HENT:     Head: Normocephalic.  Eyes:     General: No scleral icterus. Neck:     Vascular: No JVD.  Cardiovascular:     Rate and Rhythm: Normal rate and regular rhythm.     Pulses:          Radial pulses are 2+ on the right side and 2+ on the left side.     Heart sounds:  Normal heart sounds.  Pulmonary:     Effort: Pulmonary effort is normal.     Breath sounds: Normal breath sounds.  Chest:     Chest wall: No tenderness.  Abdominal:     General: Bowel sounds are normal.     Palpations: Abdomen is soft.  Musculoskeletal:     Right lower leg: No edema.     Left lower leg: No edema.  Skin:    General: Skin is warm and dry.     Capillary Refill: Capillary refill takes less than 2 seconds.  Neurological:     General: No focal deficit present.     Mental Status: She is alert.  Psychiatric:        Mood and Affect: Mood normal.        Behavior: Behavior normal.     ED Results / Procedures / Treatments   Labs (all labs ordered are listed, but only abnormal results are displayed) Labs Reviewed  COMPREHENSIVE METABOLIC PANEL - Abnormal; Notable for the  following components:      Result Value   Glucose, Bld 126 (*)    All other components within normal limits  CBC WITH DIFFERENTIAL/PLATELET  D-DIMER, QUANTITATIVE  TROPONIN I (HIGH SENSITIVITY)  TROPONIN I (HIGH SENSITIVITY)    EKG EKG Interpretation Date/Time:  Tuesday July 30 2023 10:56:55 EDT Ventricular Rate:  86 PR Interval:  143 QRS Duration:  89 QT Interval:  394 QTC Calculation: 472 R Axis:   55  Text Interpretation: Sinus rhythm ST elevation, consider inferior injury Confirmed by Vonita Moss [phone]) on 07/30/2023 10:59:57 AM  Radiology DG Chest Port 1 View  Result Date: 07/30/2023 CLINICAL DATA:  Chest pain EXAM: PORTABLE CHEST 1 VIEW COMPARISON:  Previous studies including chest radiograph done on 02/13/2019 FINDINGS: The heart size and mediastinal contours are within normal limits. Both lungs are clear. The visualized skeletal structures are unremarkable. IMPRESSION: No active cardiopulmonary disease. Electronically Signed   By: Ernie Avena M.D.   On: 07/30/2023 11:23    Procedures Procedures   Medications Ordered in ED Medications  aspirin chewable tablet 243 mg (243 mg Oral Given 07/30/23 1110)    ED Course/ Medical Decision Making/ A&P Clinical Course as of 07/30/23 1430  Tue Jul 30, 2023  1100 Dr. Eloise Harman reviewed EKG. Concern for inferior elevations that aren't quite 1mm but symptoms are similar to previous CP requiring stent placement. Will consult cardiology [LA]  1107 Dr Eldridge Dace from cardiology consulted and reviewed ekg. Will hold off on activating cath lab at this time. Feels that changes were present on prior.  [RP]    Clinical Course User Index [LA] Cristopher Peru, PA-C [RP] Rondel Baton, MD   {            HEART Score: 4                    Medical Decision Making Amount and/or Complexity of Data Reviewed Labs: ordered. Radiology: ordered.  Risk OTC drugs. Prescription drug management.  Initial Impression and  Ddx 63 year old female who presents to the emergency department with chest pain Patient PMH that increases complexity of ED encounter: Hyperlipidemia, diabetes, hypertension, CAD Differential: Acute chest syndrome, stable angina, atypical angina, pulmonary embolism, pneumothorax, aortic dissection, pleural effusion, CHF, COPD, asthma, myocarditis, pericarditis, cardiac tamponade, chest wall pain   Interpretation of Diagnostics I independent reviewed and interpreted the labs as followed: CMP within normal limits, CBC within normal limits, D-dimer is negative, troponin x  2 is negative  - I independently visualized the following imaging with scope of interpretation limited to determining acute life threatening conditions related to emergency care: Chest x-ray, which revealed no acute findings  Patient Reassessment and Ultimate Disposition/Management 62 year old female who presents to the emergency department with chest pain.  On initial evaluation, she was seen by Dr. Jarold Motto, ED attending who reviewed EKG and sees some inferior leads that are not quite 1 mm and elevation but are concerning.  He called the Cath Lab cardiologist and spoke with Dr. Eldridge Dace who did not feel that EKG changes required activation of STEMI at this time.  He felt that EKG changes were present on prior studies.  On exam she is well-appearing, no acute distress.  She is tachycardic into the 110-115 range.  Mildly hypertensive.  Added on D-dimer.  EKG without ischemia or infarction, troponin x2 negative, doubt ACS  Does not appear fluid volume overloaded on exam, no edema on CXR,  doubt CHF exacerbation  Considered but doubt PE., Wells low risk, d-dimer is negative so will not pursue CTA PE study.  CXR without evidence of pneumonia, pleural effusion or pneumothorax. No recent illnesses and troponin negative so doubt pericarditis or myocarditis  Symptoms inconsistent with aortic dissection  HEART Score: 4    Reviewed  workup with patient.  She is currently chest pain-free.  She is out of her olmesartan over the past 3 days.  I have represcribed this to her.  Additionally she has cardiology outpatient at [facility].  I have resent to ambulatory referral to have sooner follow-up evaluation.  Given her return precautions for worsening symptoms.  Otherwise do not feel that she needs further workup or intervention at this time.  Discussed case with Dr. Jarold Motto who agrees with plan of care.  The patient has been appropriately medically screened and/or stabilized in the ED. I have low suspicion for any other emergent medical condition which would require further screening, evaluation or treatment in the ED or require inpatient management. At time of discharge the patient is hemodynamically stable and in no acute distress. I have discussed work-up results and diagnosis with patient and answered all questions. Patient is agreeable with discharge plan. We discussed strict return precautions for returning to the emergency department and they verbalized understanding.     Patient management required discussion with the following services or consulting groups:  Cardiology  Complexity of Problems Addressed Acute complicated illness or Injury  Additional Data Reviewed and Analyzed Further history obtained from: Past medical history and medications listed in the EMR, Prior ED visit notes, and Care Everywhere  Patient Encounter Risk Assessment Prescriptions and Consideration of hospitalization  Final Clinical Impression(s) / ED Diagnoses Final diagnoses:  Chest pain, unspecified type    Rx / DC Orders ED Discharge Orders          Ordered    olmesartan (BENICAR) 40 MG tablet  Daily        07/30/23 1424    Ambulatory referral to Cardiology        07/30/23 1424              Cristopher Peru, PA-C 07/30/23 1432    Rondel Baton, MD 08/01/23 1349

## 2023-07-30 NOTE — ED Triage Notes (Signed)
Pt to the ed from home with a CC of increased chest pain/ pressure x 4 hours. Pt relays hx of 2 cardiac stents placed and has been told she has a semi occluded artery. Pt denies so, dizziness, loc.

## 2023-07-30 NOTE — Discharge Instructions (Signed)
You were seen in the emergency department today for chest pain.  Your workup here was reassuring.  I have represcribed your olmesartan.  I have also sent a referral over to the cardiologist to have follow-up with you.  Please return to emergency department for having any worsening or different type chest pain.

## 2023-07-30 NOTE — Telephone Encounter (Signed)
Pt returning call from yesterday. Pt states that phone call was disconnected and would like a callback regarding CP. Please advise

## 2023-08-21 ENCOUNTER — Other Ambulatory Visit: Payer: Self-pay | Admitting: Nurse Practitioner

## 2023-08-21 DIAGNOSIS — E669 Obesity, unspecified: Secondary | ICD-10-CM

## 2023-09-01 NOTE — Progress Notes (Unsigned)
Cardiology Clinic Note   Date: 09/01/2023 ID: Mary Holt, DOB [birth year], MRN [id number]  Primary Cardiologist:  Chilton Si, MD  Patient Profile    Mary Holt is a 62 y.o. female who presents to the clinic today for ***    Past medical history significant for: CAD. Coronary CTA with FFR 02/05/2020: Calcium score 215 (96 percentile).  Possible severe ostial D1 stenosis.  FFR analysis demonstrated flow-limiting stenosis of the proximal to mid LAD diagonal branch. LHC 02/26/2020: D2 95%.  Proximal to mid LCx 95%.  Mid LAD 35%.  Distal LAD 40%.  Proximal to mid RCA 45%.  Distal RCA 45%.  PCI with DES to D2 and DES to proximal to mid LCx. Echo 01/20/2020: EF 60 to 65%.  No LVH.  No RWMA.  Normal RV function.  Trivial MR.  No other significant valvular abnormalities.  No mitral valve prolapse appreciated. Hypertension. Hyperlipidemia. Lipid panel 02/14/2023: LDL 62, HDL 48, TG 55, total 122. OSA. T2DM. GERD.     History of Present Illness    Mary Holt was first evaluated by Dr. Duke Salvia on 01/06/2020 for hypertension at the request of [name], FNP.  Patient reported she was told she had mitral valve prolapse as a young child.  She had previously taken antibiotic prior to dentist visits until she developed a rash and was told she no longer needed to take the antibiotic.  She reported chest pain with lying down and bending in certain positions on the left side and under her left breast.  She was not following her other exercise routine and when she did exercise she did not experience any exertional chest pain or shortness of breath.  BP was poorly controlled during visit with PCP and she was started on losartan/chlorthalidone and olmesartan was discontinued.  She was also started on simvastatin.  She underwent echo which showed normal LV/RV function, trivial MR, and no other significant valvular abnormalities.  No mitral valve prolapse was appreciated.  Coronary CTA with FFR  showed flow-limiting stenosis proximal to mid diagonal branch.  Patient underwent PCI with DES to D2 and DES to proximal to mid LCx.  Upon subsequent follow-ups patient continued to report dull chest ache.  She was started on metoprolol tartrate and omeprazole which did not resolve her discomfort.  She self stopped medications.  She was then started on amlodipine, however she did not like the way it made her feel (arm heaviness with lifting at work) so she discontinued it on her own.  She was then prescribed carvedilol and reported significantly less chest discomfort.  Her continued complaints of dull ache in her chest was felt to be related to GERD and she was prescribed Protonix and provided with diet information.  Patient was last seen in the office by Edd Fabian, NP on 02/12/2022 for routine follow-up.  She reported improvement in chest discomfort with pantoprazole.  She had stopped taking rosuvastatin and was unable to afford Nexletol.  She was restarted on rosuvastatin and fenofibrate was added.  Patient contacted the office on 07/29/2023 with reports of chest pain for several weeks.  Patient reported chest pressure similar to symptoms prior to stent placement.  She was advised to report to the ED.  Patient presented to the ED on 07/30/2023.  EKG showed minimal ST elevation in inferior leads that did not meet STEMI criteria.  Discussed with cardiology who felt EKG similar to previous EKGs.  Troponin negative x 2, D-dimer negative, and CMP and  CBC normal.  Chest x-ray showed no active cardiopulmonary disease.  Patient was discharged to follow-up with cardiology as an outpatient.  Today, patient ***  CAD.  S/p PCI with DES to D2 and DES to proximal to mid LCx March 2021 in the setting of positive coronary CTA. Patient *** Continue aspirin, rosuvastatin, fenofibrate. Hypertension. BP today *** Patient denies headaches, dizziness or vision changes. Continue ***olmesartan. Hyperlipidemia.  LDL February 2024  62, TG 55, at goal.  Continue rosuvastatin and fenofibrate.   ROS: All other systems reviewed and are otherwise negative except as noted in History of Present Illness.  Studies Reviewed       ***  Risk Assessment/Calculations    {Does this patient have ATRIAL FIBRILLATION?[phone]} No BP recorded.  {Refresh Note OR Click here to enter BP  :1}***        Physical Exam    VS:  LMP 09/30/2014  , BMI There is no height or weight on file to calculate BMI.  GEN: Well nourished, well developed, in no acute distress. Neck: No JVD or carotid bruits. Cardiac: *** RRR. No murmurs. No rubs or gallops.   Respiratory:  Respirations regular and unlabored. Clear to auscultation without rales, wheezing or rhonchi. GI: Soft, nontender, nondistended. Extremities: Radials/DP/PT 2+ and equal bilaterally. No clubbing or cyanosis. No edema ***  Skin: Warm and dry, no rash. Neuro: Strength intact.  Assessment & Plan   ***  Disposition: ***     {Are you ordering a CV Procedure (e.g. stress test, cath, DCCV, TEE, etc)?   Press F2        :[id number]}   Signed, Etta Grandchild. Areta Terwilliger, DNP, NP-C

## 2023-09-03 ENCOUNTER — Encounter: Payer: Self-pay | Admitting: Student

## 2023-09-03 ENCOUNTER — Ambulatory Visit: Payer: PRIVATE HEALTH INSURANCE | Attending: Student | Admitting: Student

## 2023-09-03 VITALS — BP 112/74 | HR 67 | Ht 63.0 in | Wt 165.6 lb

## 2023-09-03 DIAGNOSIS — R0789 Other chest pain: Secondary | ICD-10-CM | POA: Diagnosis not present

## 2023-09-03 DIAGNOSIS — I10 Essential (primary) hypertension: Secondary | ICD-10-CM

## 2023-09-03 DIAGNOSIS — E785 Hyperlipidemia, unspecified: Secondary | ICD-10-CM

## 2023-09-03 DIAGNOSIS — I25118 Atherosclerotic heart disease of native coronary artery with other forms of angina pectoris: Secondary | ICD-10-CM | POA: Diagnosis not present

## 2023-09-03 DIAGNOSIS — R079 Chest pain, unspecified: Secondary | ICD-10-CM

## 2023-09-03 MED ORDER — NITROGLYCERIN 0.4 MG SL SUBL
0.4000 mg | SUBLINGUAL_TABLET | SUBLINGUAL | 3 refills | Status: AC | PRN
Start: 2023-09-03 — End: 2024-11-10

## 2023-09-03 NOTE — Patient Instructions (Signed)
Medication Instructions:  TAKE THE NITROGLYCERIN AS NEEDED FOR CHEST PAIN *If you need a refill on your cardiac medications before your next appointment, please call your pharmacy*   Lab Work: YOU WILL RETURN FOR LABS THE WEEK OF THE CARDIAC PET PROCEDURE If you have labs (blood work) drawn today and your tests are completely normal, you will receive your results only by: MyChart Message (if you have MyChart) OR A paper copy in the mail If you have any lab test that is abnormal or we need to change your treatment, we will call you to review the results.   Testing/Procedures:  How to Prepare for Your Cardiac PET/CT Stress Test:  1. Please do not take these medications before your test:   Medications that may interfere with the cardiac pharmacological stress agent (ex. nitrates - including erectile dysfunction medications, isosorbide mononitrate, tamulosin or beta-blockers) the day of the exam. (Erectile dysfunction medication should be held for at least 72 hrs prior to test) Theophylline containing medications for 12 hours. Dipyridamole 48 hours prior to the test. Your remaining medications may be taken with water.  2. Nothing to eat or drink, except water, 3 hours prior to arrival time.   NO caffeine/decaffeinated products, or chocolate 12 hours prior to arrival.  3. NO perfume, cologne or lotion on chest or abdomen area.          - FEMALES - Please avoid wearing dresses to this appointment.  4. Total time is 1 to 2 hours; you may want to bring reading material for the waiting time.  5. Please report to Radiology at the [facility] Main Entrance 30 minutes early for your test.  [address] [place], [place] [address]  6. Please report to Radiology at [facility] Main Entrance, medical mall, 30 mins prior to your test.  [address]  [place], [place]  [phone]  Diabetic Preparation:  Hold oral medications. You may take NPH and  Lantus insulin. Do not take Humalog or Humulin R (Regular Insulin) the day of your test. Check blood sugars prior to leaving the house. If able to eat breakfast prior to 3 hour fasting, you may take all medications, including your insulin, Do not worry if you miss your breakfast dose of insulin - start at your next meal. Patients who wear a continuous glucose monitor MUST remove the device prior to scanning.  IF YOU THINK YOU MAY BE PREGNANT, OR ARE NURSING PLEASE INFORM THE TECHNOLOGIST.  In preparation for your appointment, medication and supplies will be purchased.  Appointment availability is limited, so if you need to cancel or reschedule, please call the Radiology Department at [phone] [facility]) OR [phone] [facility])  24 hours in advance to avoid a cancellation fee of $100.00  What to Expect After you Arrive:  Once you arrive and check in for your appointment, you will be taken to a preparation room within the Radiology Department.  A technologist or Nurse will obtain your medical history, verify that you are correctly prepped for the exam, and explain the procedure.  Afterwards,  an IV will be started in your arm and electrodes will be placed on your skin for EKG monitoring during the stress portion of the exam. Then you will be escorted to the PET/CT scanner.  There, staff will get you positioned on the scanner and obtain a blood pressure and EKG.  During the exam, you will continue to be connected to the EKG and blood pressure  machines.  A small, safe amount of a radioactive tracer will be injected in your IV to obtain a series of pictures of your heart along with an injection of a stress agent.    After your Exam:  It is recommended that you eat a meal and drink a caffeinated beverage to counter act any effects of the stress agent.  Drink plenty of fluids for the remainder of the day and urinate frequently for the first couple of hours after the exam.  Your doctor will inform you  of your test results within 7-10 business days.  For more information and frequently asked questions, please visit our website : [email or web address]  For questions about your test or how to prepare for your test, please call: Cardiac Imaging Nurse Navigators Office: [phone]    Follow-Up: At [facility], you and your health needs are our priority.  As part of our continuing mission to provide you with exceptional heart care, we have created designated Provider Care Teams.  These Care Teams include your primary Cardiologist (physician) and Advanced Practice Providers (APPs -  Physician Assistants and Nurse Practitioners) who all work together to provide you with the care you need, when you need it.  We recommend signing up for the patient portal called "MyChart".  Sign up information is provided on this After Visit Summary.  MyChart is used to connect with patients for Virtual Visits (Telemedicine).  Patients are able to view lab/test results, encounter notes, upcoming appointments, etc.  Non-urgent messages can be sent to your provider as well.   To learn more about what you can do with MyChart, go to [email or web address].    Your next appointment:   3 month(s)  Provider:   Thomasene Ripple, MD

## 2023-10-11 ENCOUNTER — Other Ambulatory Visit: Payer: Self-pay | Admitting: Cardiovascular Disease

## 2023-10-21 ENCOUNTER — Other Ambulatory Visit: Payer: Self-pay

## 2023-10-21 DIAGNOSIS — R079 Chest pain, unspecified: Secondary | ICD-10-CM

## 2023-10-22 LAB — BASIC METABOLIC PANEL
BUN/Creatinine Ratio: 18 (ref 12–28)
BUN: 14 mg/dL (ref 8–27)
CO2: 22 mmol/L (ref 20–29)
Calcium: 10 mg/dL (ref 8.7–10.3)
Chloride: 106 mmol/L (ref 96–106)
Creatinine, Ser: 0.76 mg/dL (ref 0.57–1.00)
Glucose: 144 mg/dL — ABNORMAL HIGH (ref 70–99)
Potassium: 4.5 mmol/L (ref 3.5–5.2)
Sodium: 142 mmol/L (ref 134–144)
eGFR: 88 mL/min/{1.73_m2} (ref >59)

## 2023-10-28 ENCOUNTER — Telehealth (HOSPITAL_COMMUNITY): Payer: Self-pay | Admitting: Emergency Medicine

## 2023-10-28 NOTE — Telephone Encounter (Signed)
Reaching out to patient to offer assistance regarding upcoming cardiac imaging study; pt verbalizes understanding of appt date/time, parking situation and where to check in, pre-test NPO status and medications ordered, and verified current allergies; name and call back number provided for further questions should they arise Cayne Yom RN Navigator Cardiac Imaging [facility] [phone] office [phone] cell 

## 2023-10-29 ENCOUNTER — Encounter (HOSPITAL_COMMUNITY)
Admission: RE | Admit: 2023-10-29 | Discharge: 2023-10-29 | Disposition: A | Discharge status: Home / Self Care | Payer: PRIVATE HEALTH INSURANCE | Source: Ambulatory Visit | Attending: Student | Admitting: Student

## 2023-10-29 DIAGNOSIS — R079 Chest pain, unspecified: Secondary | ICD-10-CM | POA: Diagnosis not present

## 2023-10-29 LAB — NM PET CT CARDIAC PERFUSION MULTI W/ABSOLUTE BLOODFLOW
MBFR: 2.29
Nuc Rest EF: 65 %
Nuc Stress EF: 68 %
Rest MBF: 1.67 ml/g/min
Rest Nuclear Isotope Dose: 19.7 mCi
ST Depression (mm): 0 mm
Stress MBF: 3.82 ml/g/min
Stress Nuclear Isotope Dose: 19.8 mCi
TID: 1

## 2023-10-29 MED ORDER — REGADENOSON 0.4 MG/5ML IV SOLN
0.4000 mg | Freq: Once | INTRAVENOUS | Status: AC
  Administered 2023-10-29: 0.4 mg via INTRAVENOUS

## 2023-10-29 MED ORDER — RUBIDIUM RB82 GENERATOR (RUBYFILL)
19.8000 | PACK | Freq: Once | INTRAVENOUS | Status: AC
  Administered 2023-10-29: 19.72 via INTRAVENOUS

## 2023-10-29 MED ORDER — REGADENOSON 0.4 MG/5ML IV SOLN
INTRAVENOUS | Status: AC
  Filled 2023-10-29: qty 5

## 2023-10-29 MED ORDER — RUBIDIUM RB82 GENERATOR (RUBYFILL)
19.8000 | PACK | Freq: Once | INTRAVENOUS | Status: AC
  Administered 2023-10-29: 19.79 via INTRAVENOUS

## 2023-10-29 NOTE — Progress Notes (Signed)
Pt tolerated stress test, no c/o side effects. Able to ambulate to waiting room without assistance.

## 2023-11-12 ENCOUNTER — Encounter: Payer: Self-pay | Admitting: Cardiology

## 2023-11-12 ENCOUNTER — Ambulatory Visit: Payer: PRIVATE HEALTH INSURANCE | Attending: Cardiology | Admitting: Cardiology

## 2023-11-12 VITALS — BP 128/74 | HR 80 | Ht 63.0 in | Wt 170.0 lb

## 2023-11-12 DIAGNOSIS — R9439 Abnormal result of other cardiovascular function study: Secondary | ICD-10-CM | POA: Diagnosis not present

## 2023-11-12 DIAGNOSIS — Z794 Long term (current) use of insulin: Secondary | ICD-10-CM

## 2023-11-12 DIAGNOSIS — E119 Type 2 diabetes mellitus without complications: Secondary | ICD-10-CM

## 2023-11-12 DIAGNOSIS — I2510 Atherosclerotic heart disease of native coronary artery without angina pectoris: Secondary | ICD-10-CM

## 2023-11-12 DIAGNOSIS — E785 Hyperlipidemia, unspecified: Secondary | ICD-10-CM

## 2023-11-12 DIAGNOSIS — Z01812 Encounter for preprocedural laboratory examination: Secondary | ICD-10-CM

## 2023-11-12 NOTE — Progress Notes (Signed)
Cardiology Office Note:    Date:  11/12/2023   ID:  Mary Holt, DOB 11-22-1961, MRN [id number]  PCP:  [name], FNP  Cardiologist:  Thomasene Ripple, DO  Electrophysiologist:  None   Referring MD: [name], FNP   " I am ok"  History of Present Illness:    Mary Holt is a 62 y.o. female with a hx of coronary artery disease status post DES to the diagonal in the proximal to mid circumflex artery, diabetes most recent hemoglobin A1c 8.9, hyperlipidemia, hypertension, noted mitral valve prolapse here today for follow-up visit.  Her last visit in our office was on September 03, 2023 at that time she saw [name] during that time she reported that she had been chest pain.  During that visit a cardiac PET CT scan was ordered.  Her PET CT scan does show evidence of small area of apical ischemia.  She is here today with a follow with intermittent chest pressure, described as "something sitting on my chest." The sensation is similar to the symptoms she experienced prior to her first stent placement. The patient has been prescribed nitroglycerin for these symptoms and has used it three times in the past, but not in the last two weeks. The patient's diabetes is not well controlled. 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    Past Medical History:  Diagnosis Date   Angina pectoris (HCC) 02/22/2020   CAD in native artery 02/22/2020   Chest pain of uncertain etiology 01/06/2020   Diabetes mellitus without complication (HCC)    Heart murmur    asymptomatic   Hyperlipidemia    Hypertension    Kidney stones    Kidney stones    several times, last  one 2014   Mitral valve prolapse    MVP (mitral valve prolapse)    Pure hypercholesterolemia 05/02/2020   Snoring 01/06/2020    Past Surgical History:  Procedure Laterality Date   BACK SURGERY     x2   BREAST SURGERY     cyst right breast   CARDIAC CATHETERIZATION     CARPAL TUNNEL RELEASE Bilateral     CORONARY STENT INTERVENTION N/A 02/26/2020   Procedure: CORONARY STENT INTERVENTION;  Surgeon: Marykay Lex, MD;  Location: [facility] INVASIVE CV LAB;  Service: Cardiovascular;  Laterality: N/A;   DILATATION & CURRETTAGE/HYSTEROSCOPY WITH RESECTOCOPE N/A 08/11/2013   Procedure: DILATATION & CURETTAGE/HYSTEROSCOPY WITH RESECTOCOPE;  Surgeon: Serita Kyle, MD;  Location: [facility];  Service: Gynecology;  Laterality: N/A;   LEFT HEART CATH AND CORONARY ANGIOGRAPHY N/A 02/26/2020   Procedure: LEFT HEART CATH AND CORONARY ANGIOGRAPHY;  Surgeon: Marykay Lex, MD;  Location: [facility] INVASIVE CV LAB;  Service: Cardiovascular;  Laterality: N/A;   TONSILLECTOMY     TRIGGER FINGER RELEASE     left thumb   TUBAL LIGATION      Current Medications: Current Meds  Medication Sig   aspirin EC 81 MG tablet Take 1 tablet (81 mg total) by mouth daily.   blood glucose meter kit and supplies KIT Dispense based on patient and insurance preference. Use up to four times daily as directed.   Blood Glucose Monitoring Suppl DEVI 1 each by Does not apply route in the morning, at noon, and at bedtime. May substitute to any manufacturer covered by patient's insurance.   fenofibrate (TRICOR) 145 MG tablet TAKE 1 TABLET(145 MG) BY MOUTH DAILY   JARDIANCE 25 MG TABS tablet TAKE  1 TABLET(25 MG) BY MOUTH DAILY BEFORE BREAKFAST   olmesartan (BENICAR) 40 MG tablet Take 1 tablet (40 mg total) by mouth daily.   rosuvastatin (CRESTOR) 40 MG tablet TAKE 1 TABLET BY MOUTH  DAILY AT 6 PM (Patient taking differently: Take 40 mg by mouth daily.)     Allergies:   Relafen [nabumetone] and Penicillins   Social History   Socioeconomic History   Marital status: Single    Spouse name: Not on file   Number of children: Not on file   Years of education: 12   Highest education level: Not on file  Occupational History   Not on file  Tobacco Use   Smoking status: Never   Smokeless tobacco: Never  Substance and Sexual Activity    Alcohol use: No   Drug use: No   Sexual activity: Not on file  Other Topics Concern   Not on file  Social History Narrative   Not on file   Social Determinants of Health   Financial Resource Strain: Not on file  Food Insecurity: Not on file  Transportation Needs: Not on file  Physical Activity: Not on file  Stress: Not on file  Social Connections: Not on file     Family History: The patient's family history includes Breast cancer in her sister; Heart attack in her brother, brother, and father; Lymphoma in her sister; Ovarian cancer in her sister; Stroke in her mother. There is no history of Colon cancer, Colon polyps, Esophageal cancer, Rectal cancer, or Stomach cancer.  ROS:   Review of Systems  Constitution: Negative for decreased appetite, fever and weight gain.  HENT: Negative for congestion, ear discharge, hoarse voice and sore throat.   Eyes: Negative for discharge, redness, vision loss in right eye and visual halos.  Cardiovascular: Negative for chest pain, dyspnea on exertion, leg swelling, orthopnea and palpitations.  Respiratory: Negative for cough, hemoptysis, shortness of breath and snoring.   Endocrine: Negative for heat intolerance and polyphagia.  Hematologic/Lymphatic: Negative for bleeding problem. Does not bruise/bleed easily.  Skin: Negative for flushing, nail changes, rash and suspicious lesions.  Musculoskeletal: Negative for arthritis, joint pain, muscle cramps, myalgias, neck pain and stiffness.  Gastrointestinal: Negative for abdominal pain, bowel incontinence, diarrhea and excessive appetite.  Genitourinary: Negative for decreased libido, genital sores and incomplete emptying.  Neurological: Negative for brief paralysis, focal weakness, headaches and loss of balance.  Psychiatric/Behavioral: Negative for altered mental status, depression and suicidal ideas.  Allergic/Immunologic: Negative for HIV exposure and persistent infections.    EKGs/Labs/Other  Studies Reviewed:    The following studies were reviewed today:   EKG:  The ekg ordered today demonstrates   Recent Labs: 02/14/2023: TSH 1.130 07/30/2023: ALT 18; Hemoglobin 13.0; Platelets 340 10/21/2023: BUN 14; Creatinine, Ser 0.76; Potassium 4.5; Sodium 142  Recent Lipid Panel    Component Value Date/Time   CHOL 122 02/14/2023 1109   TRIG 55 02/14/2023 1109   HDL 48 02/14/2023 1109   CHOLHDL 2.5 02/14/2023 1109   LDLCALC 62 02/14/2023 1109    Physical Exam:    VS:  BP 128/74 (BP Location: Left Arm, Patient Position: Sitting, Cuff Size: Normal)   Pulse 80   Ht 5\' 3"  (1.6 m)   Wt 170 lb (77.1 kg)   LMP 09/30/2014   SpO2 97%   BMI 30.11 kg/m     Wt Readings from Last 3 Encounters:  11/12/23 170 lb (77.1 kg)  09/03/23 165 lb 9.6 oz (75.1 kg)  07/30/23 174  lb 2.6 oz (79 kg)     GEN: Well nourished, well developed in no acute distress HEENT: Normal NECK: No JVD; No carotid bruits LYMPHATICS: No lymphadenopathy CARDIAC: S1S2 noted,RRR, no murmurs, rubs, gallops RESPIRATORY:  Clear to auscultation without rales, wheezing or rhonchi  ABDOMEN: Soft, non-tender, non-distended, +bowel sounds, no guarding. EXTREMITIES: No edema, No cyanosis, no clubbing MUSCULOSKELETAL:  No deformity  SKIN: Warm and dry NEUROLOGIC:  Alert and oriented x 3, non-focal PSYCHIATRIC:  Normal affect, good insight  ASSESSMENT:    1. Pre-procedure lab exam   2. Abnormal stress test   3. Hyperlipidemia, unspecified hyperlipidemia type   4. Coronary artery disease involving native heart, unspecified vessel or lesion type, unspecified whether angina present   5. Insulin-requiring or dependent type II diabetes mellitus (HCC)    PLAN:    Coronary Artery Disease History of stent placement. Recent chest pressure similar to prior presentation, but not currently present. Used nitroglycerin three times in the past, but not in the last two weeks. Stress test showed a small area of potential  ischemia. Plan for heart catheter[facility] at [facility] to visualize potential blockage and determine if another stent is needed. The patient understands that risks include but are not limited to stroke (1 in 1000), death (1 in 1000), kidney failure [usually temporary] (1 in 500), bleeding (1 in 200), allergic reaction [possibly serious] (1 in 200), and agrees to proceed.  Diabetes Mellitus Not well controlled. Continue current management and monitor.  Hyperlipidemia LDL slightly above target (62, target <55) while on Crestor 40mg . Repeat lipid profile today. Consider adding additional medication if LDL remains above target.  The patient is in agreement with the above plan. The patient left the office in stable condition.  The patient will follow up in   Medication Adjustments/Labs and Tests Ordered: Current medicines are reviewed at length with the patient today.  Concerns regarding medicines are outlined above.  Orders Placed This Encounter  Procedures   Lipid panel   Comprehensive Metabolic Panel (CMET)   Magnesium   CBC with Differential/Platelet   No orders of the defined types were placed in this encounter.   Patient Instructions  Medication Instructions:  Your physician recommends that you continue on your current medications as directed. Please refer to the Current Medication list given to you today.  *If you need a refill on your cardiac medications before your next appointment, please call your pharmacy*   Lab Work: Lipids, CMET, Mag, CBC If you have labs (blood work) drawn today and your tests are completely normal, you will receive your results only by: MyChart Message (if you have MyChart) OR A paper copy in the mail If you have any lab test that is abnormal or we need to change your treatment, we will call you to review the results.   Testing/Procedures:  You are scheduled for a Cardiac Catheterization on Monday, December 2 with Dr. Peter [place].  1. Please  arrive at the [facility] (Main Entrance A) at [facility]: [address] [place], [place] [address] at 7:00 AM (This time is 2 hour(s) before your procedure to ensure your preparation).   Free valet parking service is available. You will check in at ADMITTING. The support person will be asked to wait in the waiting room.  It is OK to have someone drop you off and come back when you are ready to be discharged.    Special note: Every effort is made to have your procedure done  on time. Please understand that emergencies sometimes delay scheduled procedures.  2. Diet: Do not eat solid foods after midnight.  The patient may have clear liquids until 5am upon the day of the procedure.  3. Labs: You will need to have blood drawn on TODAY  4. Medication instructions in preparation for your procedure:   Contrast Allergy: No  On the morning of your procedure, take your Aspirin 81 mg and any morning medicines NOT listed above.  You may use sips of water.  5. Plan to go home the same day, you will only stay overnight if medically necessary. 6. Bring a current list of your medications and current insurance cards. 7. You MUST have a responsible person to drive you home. 8. Someone MUST be with you the first 24 hours after you arrive home or your discharge will be delayed. 9. Please wear clothes that are easy to get on and off and wear slip-on shoes.  Thank you for allowing [place] to care for you!   --  Invasive Cardiovascular services    Follow-Up: At [facility], you and your health needs are our priority.  As part of our continuing mission to provide you with exceptional heart care, we have created designated Provider Care Teams.  These Care Teams include your primary Cardiologist (physician) and Advanced Practice Providers (APPs -  Physician Assistants and Nurse Practitioners) who all work together to provide you with the care you need, when you need it.  We recommend  signing up for the patient portal called "MyChart".  Sign up information is provided on this After Visit Summary.  MyChart is used to connect with patients for Virtual Visits (Telemedicine).  Patients are able to view lab/test results, encounter notes, upcoming appointments, etc.  Non-urgent messages can be sent to your provider as well.   To learn more about what you can do with MyChart, go to [email or web address].    Your next appointment:   2 week(s) after cath  Provider:   Thomasene Ripple, DO    Adopting a Healthy Lifestyle.  Know what a healthy weight is for you (roughly BMI <25) and aim to maintain this   Aim for 7+ servings of fruits and vegetables daily   65-80+ fluid ounces of water or unsweet tea for healthy kidneys   Limit to max 1 drink of alcohol per day; avoid smoking/tobacco   Limit animal fats in diet for cholesterol and heart health - choose grass fed whenever available   Avoid highly processed foods, and foods high in saturated/trans fats   Aim for low stress - take time to unwind and care for your mental health   Aim for 150 min of moderate intensity exercise weekly for heart health, and weights twice weekly for bone health   Aim for 7-9 hours of sleep daily   When it comes to diets, agreement about the perfect plan isnt easy to find, even among the experts. Experts at the [facility] [employer] developed an idea known as the Healthy Eating Plate. Just imagine a plate divided into logical, healthy portions.   The emphasis is on diet quality:   Load up on vegetables and fruits - one-half of your plate: Aim for color and variety, and remember that potatoes dont count.   Go for whole grains - one-quarter of your plate: Whole wheat, barley, wheat berries, quinoa, oats, brown rice, and foods made with them. If you want pasta, go with whole wheat pasta.  Protein power - one-quarter of your plate: Fish, chicken, beans, and nuts are all healthy, versatile  protein sources. Limit red meat.   The diet, however, does go beyond the plate, offering a few other suggestions.   Use healthy plant oils, such as olive, canola, soy, corn, sunflower and peanut. Check the labels, and avoid partially hydrogenated oil, which have unhealthy trans fats.   If youre thirsty, drink water. Coffee and tea are good in moderation, but skip sugary drinks and limit milk and dairy products to one or two daily servings.   The type of carbohydrate in the diet is more important than the amount. Some sources of carbohydrates, such as vegetables, fruits, whole grains, and beans-are healthier than others.   Finally, stay active  Signed, Thomasene Ripple, DO  11/12/2023 4:13 PM    [facility]

## 2023-11-12 NOTE — H&P (View-Only) (Signed)
 Cardiology Office Note:    Date:  11/12/2023   ID:  Mary Holt, DOB 11-22-1961, MRN [id number]  PCP:  [name], FNP  Cardiologist:  Thomasene Ripple, DO  Electrophysiologist:  None   Referring MD: [name], FNP   " I am ok"  History of Present Illness:    Mary Holt is a 62 y.o. female with a hx of coronary artery disease status post DES to the diagonal in the proximal to mid circumflex artery, diabetes most recent hemoglobin A1c 8.9, hyperlipidemia, hypertension, noted mitral valve prolapse here today for follow-up visit.  Her last visit in our office was on September 03, 2023 at that time she saw [name] during that time she reported that she had been chest pain.  During that visit a cardiac PET CT scan was ordered.  Her PET CT scan does show evidence of small area of apical ischemia.  She is here today with a follow with intermittent chest pressure, described as "something sitting on my chest." The sensation is similar to the symptoms she experienced prior to her first stent placement. The patient has been prescribed nitroglycerin for these symptoms and has used it three times in the past, but not in the last two weeks. The patient's diabetes is not well controlled. 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    Past Medical History:  Diagnosis Date   Angina pectoris (HCC) 02/22/2020   CAD in native artery 02/22/2020   Chest pain of uncertain etiology 01/06/2020   Diabetes mellitus without complication (HCC)    Heart murmur    asymptomatic   Hyperlipidemia    Hypertension    Kidney stones    Kidney stones    several times, last  one 2014   Mitral valve prolapse    MVP (mitral valve prolapse)    Pure hypercholesterolemia 05/02/2020   Snoring 01/06/2020    Past Surgical History:  Procedure Laterality Date   BACK SURGERY     x2   BREAST SURGERY     cyst right breast   CARDIAC CATHETERIZATION     CARPAL TUNNEL RELEASE Bilateral     CORONARY STENT INTERVENTION N/A 02/26/2020   Procedure: CORONARY STENT INTERVENTION;  Surgeon: Marykay Lex, MD;  Location: [facility] INVASIVE CV LAB;  Service: Cardiovascular;  Laterality: N/A;   DILATATION & CURRETTAGE/HYSTEROSCOPY WITH RESECTOCOPE N/A 08/11/2013   Procedure: DILATATION & CURETTAGE/HYSTEROSCOPY WITH RESECTOCOPE;  Surgeon: Serita Kyle, MD;  Location: [facility];  Service: Gynecology;  Laterality: N/A;   LEFT HEART CATH AND CORONARY ANGIOGRAPHY N/A 02/26/2020   Procedure: LEFT HEART CATH AND CORONARY ANGIOGRAPHY;  Surgeon: Marykay Lex, MD;  Location: [facility] INVASIVE CV LAB;  Service: Cardiovascular;  Laterality: N/A;   TONSILLECTOMY     TRIGGER FINGER RELEASE     left thumb   TUBAL LIGATION      Current Medications: Current Meds  Medication Sig   aspirin EC 81 MG tablet Take 1 tablet (81 mg total) by mouth daily.   blood glucose meter kit and supplies KIT Dispense based on patient and insurance preference. Use up to four times daily as directed.   Blood Glucose Monitoring Suppl DEVI 1 each by Does not apply route in the morning, at noon, and at bedtime. May substitute to any manufacturer covered by patient's insurance.   fenofibrate (TRICOR) 145 MG tablet TAKE 1 TABLET(145 MG) BY MOUTH DAILY   JARDIANCE 25 MG TABS tablet TAKE  1 TABLET(25 MG) BY MOUTH DAILY BEFORE BREAKFAST   olmesartan (BENICAR) 40 MG tablet Take 1 tablet (40 mg total) by mouth daily.   rosuvastatin (CRESTOR) 40 MG tablet TAKE 1 TABLET BY MOUTH  DAILY AT 6 PM (Patient taking differently: Take 40 mg by mouth daily.)     Allergies:   Relafen [nabumetone] and Penicillins   Social History   Socioeconomic History   Marital status: Single    Spouse name: Not on file   Number of children: Not on file   Years of education: 12   Highest education level: Not on file  Occupational History   Not on file  Tobacco Use   Smoking status: Never   Smokeless tobacco: Never  Substance and Sexual Activity    Alcohol use: No   Drug use: No   Sexual activity: Not on file  Other Topics Concern   Not on file  Social History Narrative   Not on file   Social Determinants of Health   Financial Resource Strain: Not on file  Food Insecurity: Not on file  Transportation Needs: Not on file  Physical Activity: Not on file  Stress: Not on file  Social Connections: Not on file     Family History: The patient's family history includes Breast cancer in her sister; Heart attack in her brother, brother, and father; Lymphoma in her sister; Ovarian cancer in her sister; Stroke in her mother. There is no history of Colon cancer, Colon polyps, Esophageal cancer, Rectal cancer, or Stomach cancer.  ROS:   Review of Systems  Constitution: Negative for decreased appetite, fever and weight gain.  HENT: Negative for congestion, ear discharge, hoarse voice and sore throat.   Eyes: Negative for discharge, redness, vision loss in right eye and visual halos.  Cardiovascular: Negative for chest pain, dyspnea on exertion, leg swelling, orthopnea and palpitations.  Respiratory: Negative for cough, hemoptysis, shortness of breath and snoring.   Endocrine: Negative for heat intolerance and polyphagia.  Hematologic/Lymphatic: Negative for bleeding problem. Does not bruise/bleed easily.  Skin: Negative for flushing, nail changes, rash and suspicious lesions.  Musculoskeletal: Negative for arthritis, joint pain, muscle cramps, myalgias, neck pain and stiffness.  Gastrointestinal: Negative for abdominal pain, bowel incontinence, diarrhea and excessive appetite.  Genitourinary: Negative for decreased libido, genital sores and incomplete emptying.  Neurological: Negative for brief paralysis, focal weakness, headaches and loss of balance.  Psychiatric/Behavioral: Negative for altered mental status, depression and suicidal ideas.  Allergic/Immunologic: Negative for HIV exposure and persistent infections.    EKGs/Labs/Other  Studies Reviewed:    The following studies were reviewed today:   EKG:  The ekg ordered today demonstrates   Recent Labs: 02/14/2023: TSH 1.130 07/30/2023: ALT 18; Hemoglobin 13.0; Platelets 340 10/21/2023: BUN 14; Creatinine, Ser 0.76; Potassium 4.5; Sodium 142  Recent Lipid Panel    Component Value Date/Time   CHOL 122 02/14/2023 1109   TRIG 55 02/14/2023 1109   HDL 48 02/14/2023 1109   CHOLHDL 2.5 02/14/2023 1109   LDLCALC 62 02/14/2023 1109    Physical Exam:    VS:  BP 128/74 (BP Location: Left Arm, Patient Position: Sitting, Cuff Size: Normal)   Pulse 80   Ht 5\' 3"  (1.6 m)   Wt 170 lb (77.1 kg)   LMP 09/30/2014   SpO2 97%   BMI 30.11 kg/m     Wt Readings from Last 3 Encounters:  11/12/23 170 lb (77.1 kg)  09/03/23 165 lb 9.6 oz (75.1 kg)  07/30/23 174  lb 2.6 oz (79 kg)     GEN: Well nourished, well developed in no acute distress HEENT: Normal NECK: No JVD; No carotid bruits LYMPHATICS: No lymphadenopathy CARDIAC: S1S2 noted,RRR, no murmurs, rubs, gallops RESPIRATORY:  Clear to auscultation without rales, wheezing or rhonchi  ABDOMEN: Soft, non-tender, non-distended, +bowel sounds, no guarding. EXTREMITIES: No edema, No cyanosis, no clubbing MUSCULOSKELETAL:  No deformity  SKIN: Warm and dry NEUROLOGIC:  Alert and oriented x 3, non-focal PSYCHIATRIC:  Normal affect, good insight  ASSESSMENT:    1. Pre-procedure lab exam   2. Abnormal stress test   3. Hyperlipidemia, unspecified hyperlipidemia type   4. Coronary artery disease involving native heart, unspecified vessel or lesion type, unspecified whether angina present   5. Insulin-requiring or dependent type II diabetes mellitus (HCC)    PLAN:    Coronary Artery Disease History of stent placement. Recent chest pressure similar to prior presentation, but not currently present. Used nitroglycerin three times in the past, but not in the last two weeks. Stress test showed a small area of potential  ischemia. Plan for heart catheterization at [facility] to visualize potential blockage and determine if another stent is needed. The patient understands that risks include but are not limited to stroke (1 in 1000), death (1 in 1000), kidney failure [usually temporary] (1 in 500), bleeding (1 in 200), allergic reaction [possibly serious] (1 in 200), and agrees to proceed.  Diabetes Mellitus Not well controlled. Continue current management and monitor.  Hyperlipidemia LDL slightly above target (62, target <55) while on Crestor 40mg . Repeat lipid profile today. Consider adding additional medication if LDL remains above target.  The patient is in agreement with the above plan. The patient left the office in stable condition.  The patient will follow up in   Medication Adjustments/Labs and Tests Ordered: Current medicines are reviewed at length with the patient today.  Concerns regarding medicines are outlined above.  Orders Placed This Encounter  Procedures   Lipid panel   Comprehensive Metabolic Panel (CMET)   Magnesium   CBC with Differential/Platelet   No orders of the defined types were placed in this encounter.   Patient Instructions  Medication Instructions:  Your physician recommends that you continue on your current medications as directed. Please refer to the Current Medication list given to you today.  *If you need a refill on your cardiac medications before your next appointment, please call your pharmacy*   Lab Work: Lipids, CMET, Mag, CBC If you have labs (blood work) drawn today and your tests are completely normal, you will receive your results only by: MyChart Message (if you have MyChart) OR A paper copy in the mail If you have any lab test that is abnormal or we need to change your treatment, we will call you to review the results.   Testing/Procedures:  You are scheduled for a Cardiac Catheterization on Monday, December 2 with Dr. Peter [place]iland.  1. Please  arrive at the [facility] (Main Entrance A) at [facility]: [address] [place], [place] [address] at 7:00 AM (This time is 2 hour(s) before your procedure to ensure your preparation).   Free valet parking service is available. You will check in at ADMITTING. The support person will be asked to wait in the waiting room.  It is OK to have someone drop you off and come back when you are ready to be discharged.    Special note: Every effort is made to have your procedure done  on time. Please understand that emergencies sometimes delay scheduled procedures.  2. Diet: Do not eat solid foods after midnight.  The patient may have clear liquids until 5am upon the day of the procedure.  3. Labs: You will need to have blood drawn on TODAY  4. Medication instructions in preparation for your procedure:   Contrast Allergy: No  On the morning of your procedure, take your Aspirin 81 mg and any morning medicines NOT listed above.  You may use sips of water.  5. Plan to go home the same day, you will only stay overnight if medically necessary. 6. Bring a current list of your medications and current insurance cards. 7. You MUST have a responsible person to drive you home. 8. Someone MUST be with you the first 24 hours after you arrive home or your discharge will be delayed. 9. Please wear clothes that are easy to get on and off and wear slip-on shoes.  Thank you for allowing [place] to care for you!   --  Invasive Cardiovascular services    Follow-Up: At [facility], you and your health needs are our priority.  As part of our continuing mission to provide you with exceptional heart care, we have created designated Provider Care Teams.  These Care Teams include your primary Cardiologist (physician) and Advanced Practice Providers (APPs -  Physician Assistants and Nurse Practitioners) who all work together to provide you with the care you need, when you need it.  We recommend  signing up for the patient portal called "MyChart".  Sign up information is provided on this After Visit Summary.  MyChart is used to connect with patients for Virtual Visits (Telemedicine).  Patients are able to view lab/test results, encounter notes, upcoming appointments, etc.  Non-urgent messages can be sent to your provider as well.   To learn more about what you can do with MyChart, go to [email or web address].    Your next appointment:   2 week(s) after cath  Provider:   Thomasene Ripple, DO    Adopting a Healthy Lifestyle.  Know what a healthy weight is for you (roughly BMI <25) and aim to maintain this   Aim for 7+ servings of fruits and vegetables daily   65-80+ fluid ounces of water or unsweet tea for healthy kidneys   Limit to max 1 drink of alcohol per day; avoid smoking/tobacco   Limit animal fats in diet for cholesterol and heart health - choose grass fed whenever available   Avoid highly processed foods, and foods high in saturated/trans fats   Aim for low stress - take time to unwind and care for your mental health   Aim for 150 min of moderate intensity exercise weekly for heart health, and weights twice weekly for bone health   Aim for 7-9 hours of sleep daily   When it comes to diets, agreement about the perfect plan isnt easy to find, even among the experts. Experts at the [facility] [employer] developed an idea known as the Healthy Eating Plate. Just imagine a plate divided into logical, healthy portions.   The emphasis is on diet quality:   Load up on vegetables and fruits - one-half of your plate: Aim for color and variety, and remember that potatoes dont count.   Go for whole grains - one-quarter of your plate: Whole wheat, barley, wheat berries, quinoa, oats, brown rice, and foods made with them. If you want pasta, go with whole wheat pasta.  Protein power - one-quarter of your plate: Fish, chicken, beans, and nuts are all healthy, versatile  protein sources. Limit red meat.   The diet, however, does go beyond the plate, offering a few other suggestions.   Use healthy plant oils, such as olive, canola, soy, corn, sunflower and peanut. Check the labels, and avoid partially hydrogenated oil, which have unhealthy trans fats.   If youre thirsty, drink water. Coffee and tea are good in moderation, but skip sugary drinks and limit milk and dairy products to one or two daily servings.   The type of carbohydrate in the diet is more important than the amount. Some sources of carbohydrates, such as vegetables, fruits, whole grains, and beans-are healthier than others.   Finally, stay active  Signed, Thomasene Ripple, DO  11/12/2023 4:13 PM    [facility]

## 2023-11-12 NOTE — Patient Instructions (Addendum)
Medication Instructions:  Your physician recommends that you continue on your current medications as directed. Please refer to the Current Medication list given to you today.  *If you need a refill on your cardiac medications before your next appointment, please call your pharmacy*   Lab Work: Lipids, CMET, Mag, CBC If you have labs (blood work) drawn today and your tests are completely normal, you will receive your results only by: MyChart Message (if you have MyChart) OR A paper copy in the mail If you have any lab test that is abnormal or we need to change your treatment, we will call you to review the results.   Testing/Procedures:  You are scheduled for a Cardiac Catheterization on Monday, December 2 with Dr. Peter Swaziland.  1. Please arrive at the [facility] (Main Entrance A) at [facility]: [address] [place], [place] [address] at 7:00 AM (This time is 2 hour(s) before your procedure to ensure your preparation).   Free valet parking service is available. You will check in at ADMITTING. The support person will be asked to wait in the waiting room.  It is OK to have someone drop you off and come back when you are ready to be discharged.    Special note: Every effort is made to have your procedure done on time. Please understand that emergencies sometimes delay scheduled procedures.  2. Diet: Do not eat solid foods after midnight.  The patient may have clear liquids until 5am upon the day of the procedure.  3. Labs: You will need to have blood drawn on TODAY  4. Medication instructions in preparation for your procedure:   Contrast Allergy: No  On the morning of your procedure, take your Aspirin 81 mg and any morning medicines NOT listed above.  You may use sips of water.  5. Plan to go home the same day, you will only stay overnight if medically necessary. 6. Bring a current list of your medications and current insurance cards. 7. You MUST have a responsible person  to drive you home. 8. Someone MUST be with you the first 24 hours after you arrive home or your discharge will be delayed. 9. Please wear clothes that are easy to get on and off and wear slip-on shoes.  Thank you for allowing [place] to care for you!   -- [place] [facility] Cardi[facility] services    Follow-Up: At [facility], you and your health needs are our priority.  As part of our continuing mission to provide you with exceptional heart care, we have created designated Provider Care Teams.  These Care Teams include your primary Cardiologist (physician) and Advanced Practice Providers (APPs -  Physician Assistants and Nurse Practitioners) who all work together to provide you with the care you need, when you need it.  We recommend signing up for the patient portal called "MyChart".  Sign up information is provided on this After Visit Summary.  MyChart is used to connect with patients for Virtual Visits (Telemedicine).  Patients are able to view lab/test results, encounter notes, upcoming appointments, etc.  Non-urgent messages can be sent to your provider as well.   To learn more about what you can do with MyChart, go to [email or web address].    Your next appointment:   2 week(s) after cath  Provider:   Thomasene Ripple, DO

## 2023-11-13 LAB — CBC WITH DIFFERENTIAL/PLATELET
Basophils Absolute: 0.1 10*3/uL (ref 0.0–0.2)
Basos: 1 %
EOS (ABSOLUTE): 0.2 10*3/uL (ref 0.0–0.4)
Eos: 3 %
Hematocrit: 42.7 % (ref 34.0–46.6)
Hemoglobin: 13.2 g/dL (ref 11.1–15.9)
Immature Grans (Abs): 0 10*3/uL (ref 0.0–0.1)
Immature Granulocytes: 0 %
Lymphocytes Absolute: 2.2 10*3/uL (ref 0.7–3.1)
Lymphs: 35 %
MCH: 27.1 pg (ref 26.6–33.0)
MCHC: 30.9 g/dL — ABNORMAL LOW (ref 31.5–35.7)
MCV: 88 fL (ref 79–97)
Monocytes Absolute: 0.4 10*3/uL (ref 0.1–0.9)
Monocytes: 6 %
Neutrophils Absolute: 3.5 10*3/uL (ref 1.4–7.0)
Neutrophils: 55 %
Platelets: 379 10*3/uL (ref 150–450)
RBC: 4.87 x10E6/uL (ref 3.77–5.28)
RDW: 13.2 % (ref 11.7–15.4)
WBC: 6.3 10*3/uL (ref 3.4–10.8)

## 2023-11-13 LAB — COMPREHENSIVE METABOLIC PANEL
ALT: 14 [IU]/L (ref 0–32)
AST: 19 [IU]/L (ref 0–40)
Albumin: 4.5 g/dL (ref 3.9–4.9)
Alkaline Phosphatase: 83 [IU]/L (ref 44–121)
BUN/Creatinine Ratio: 25 (ref 12–28)
BUN: 17 mg/dL (ref 8–27)
Bilirubin Total: <0.2 mg/dL (ref 0.0–1.2)
CO2: 20 mmol/L (ref 20–29)
Calcium: 9.4 mg/dL (ref 8.7–10.3)
Chloride: 105 mmol/L (ref 96–106)
Creatinine, Ser: 0.68 mg/dL (ref 0.57–1.00)
Globulin, Total: 2.6 g/dL (ref 1.5–4.5)
Glucose: 163 mg/dL — ABNORMAL HIGH (ref 70–99)
Potassium: 4.1 mmol/L (ref 3.5–5.2)
Sodium: 141 mmol/L (ref 134–144)
Total Protein: 7.1 g/dL (ref 6.0–8.5)
eGFR: 98 mL/min/{1.73_m2} (ref >59)

## 2023-11-13 LAB — LIPID PANEL
Chol/HDL Ratio: 3 ratio (ref 0.0–4.4)
Cholesterol, Total: 166 mg/dL (ref 100–199)
HDL: 56 mg/dL (ref >39)
LDL Chol Calc (NIH): 99 mg/dL (ref 0–99)
Triglycerides: 57 mg/dL (ref 0–149)
VLDL Cholesterol Cal: 11 mg/dL (ref 5–40)

## 2023-11-13 LAB — MAGNESIUM: Magnesium: 1.8 mg/dL (ref 1.6–2.3)

## 2023-11-25 ENCOUNTER — Encounter (HOSPITAL_COMMUNITY)
Admission: RE | Disposition: A | Discharge status: Home / Self Care | Payer: Self-pay | Source: Home / Self Care | Attending: Cardiology

## 2023-11-25 ENCOUNTER — Ambulatory Visit (HOSPITAL_COMMUNITY)
Admission: RE | Admit: 2023-11-25 | Discharge: 2023-11-25 | Disposition: A | Discharge status: Home / Self Care | Payer: PRIVATE HEALTH INSURANCE | Attending: Cardiology | Admitting: Cardiology

## 2023-11-25 ENCOUNTER — Encounter (HOSPITAL_COMMUNITY): Payer: Self-pay | Admitting: Cardiology

## 2023-11-25 DIAGNOSIS — Z955 Presence of coronary angioplasty implant and graft: Secondary | ICD-10-CM | POA: Insufficient documentation

## 2023-11-25 DIAGNOSIS — Z79899 Other long term (current) drug therapy: Secondary | ICD-10-CM | POA: Diagnosis not present

## 2023-11-25 DIAGNOSIS — R9439 Abnormal result of other cardiovascular function study: Secondary | ICD-10-CM | POA: Diagnosis present

## 2023-11-25 DIAGNOSIS — E785 Hyperlipidemia, unspecified: Secondary | ICD-10-CM | POA: Insufficient documentation

## 2023-11-25 DIAGNOSIS — I25119 Atherosclerotic heart disease of native coronary artery with unspecified angina pectoris: Secondary | ICD-10-CM | POA: Insufficient documentation

## 2023-11-25 DIAGNOSIS — Z794 Long term (current) use of insulin: Secondary | ICD-10-CM | POA: Insufficient documentation

## 2023-11-25 DIAGNOSIS — E119 Type 2 diabetes mellitus without complications: Secondary | ICD-10-CM | POA: Diagnosis not present

## 2023-11-25 HISTORY — PX: LEFT HEART CATH AND CORONARY ANGIOGRAPHY: CATH118249

## 2023-11-25 LAB — GLUCOSE, CAPILLARY: Glucose-Capillary: 125 mg/dL — ABNORMAL HIGH (ref 70–99)

## 2023-11-25 SURGERY — LEFT HEART CATH AND CORONARY ANGIOGRAPHY
Anesthesia: LOCAL

## 2023-11-25 MED ORDER — MIDAZOLAM HCL 2 MG/2ML IJ SOLN
INTRAMUSCULAR | Status: DC | PRN
  Administered 2023-11-25: 1 mg via INTRAVENOUS

## 2023-11-25 MED ORDER — LIDOCAINE HCL (PF) 1 % IJ SOLN
INTRAMUSCULAR | Status: DC | PRN
  Administered 2023-11-25: 2 mL

## 2023-11-25 MED ORDER — HEPARIN SODIUM (PORCINE) 1000 UNIT/ML IJ SOLN
INTRAMUSCULAR | Status: AC
  Filled 2023-11-25: qty 10

## 2023-11-25 MED ORDER — VERAPAMIL HCL 2.5 MG/ML IV SOLN
INTRAVENOUS | Status: AC
  Filled 2023-11-25: qty 2

## 2023-11-25 MED ORDER — MIDAZOLAM HCL 2 MG/2ML IJ SOLN
INTRAMUSCULAR | Status: AC
Start: 2023-11-25 — End: ?
  Filled 2023-11-25: qty 2

## 2023-11-25 MED ORDER — FENTANYL CITRATE (PF) 100 MCG/2ML IJ SOLN
INTRAMUSCULAR | Status: DC | PRN
  Administered 2023-11-25: 25 ug via INTRAVENOUS

## 2023-11-25 MED ORDER — IOHEXOL 350 MG/ML SOLN
INTRAVENOUS | Status: DC | PRN
  Administered 2023-11-25: 40 mL

## 2023-11-25 MED ORDER — FENTANYL CITRATE (PF) 100 MCG/2ML IJ SOLN
INTRAMUSCULAR | Status: AC
  Filled 2023-11-25: qty 2

## 2023-11-25 MED ORDER — VERAPAMIL HCL 2.5 MG/ML IV SOLN
INTRAVENOUS | Status: DC | PRN
  Administered 2023-11-25: 10 mL via INTRA_ARTERIAL

## 2023-11-25 MED ORDER — SODIUM CHLORIDE 0.9 % WEIGHT BASED INFUSION: 1.0000 mL/kg/h | INTRAVENOUS | Status: DC

## 2023-11-25 MED ORDER — ASPIRIN 81 MG PO CHEW: 81.0000 mg | CHEWABLE_TABLET | ORAL | Status: DC

## 2023-11-25 MED ORDER — HEPARIN SODIUM (PORCINE) 1000 UNIT/ML IJ SOLN
INTRAMUSCULAR | Status: DC | PRN
  Administered 2023-11-25: 4000 [IU] via INTRAVENOUS

## 2023-11-25 MED ORDER — SODIUM CHLORIDE 0.9 % WEIGHT BASED INFUSION
3.0000 mL/kg/h | INTRAVENOUS | Status: AC
  Administered 2023-11-25: 3 mL/kg/h via INTRAVENOUS

## 2023-11-25 MED ORDER — LIDOCAINE HCL (PF) 1 % IJ SOLN
INTRAMUSCULAR | Status: AC
  Filled 2023-11-25: qty 30

## 2023-11-25 MED ORDER — HEPARIN (PORCINE) IN NACL 1000-0.9 UT/500ML-% IV SOLN
INTRAVENOUS | Status: DC | PRN
  Administered 2023-11-25 (×2): 500 mL

## 2023-11-25 SURGICAL SUPPLY — 8 items
CATH 5FR JL3.5 JR4 ANG PIG MP (CATHETERS) IMPLANT
DEVICE RAD COMP TR BAND LRG (VASCULAR PRODUCTS) IMPLANT
GLIDESHEATH SLEND SS 6F .021 (SHEATH) IMPLANT
GUIDEWIRE INQWIRE 1.5J.035X260 (WIRE) IMPLANT
INQWIRE 1.5J .035X260CM (WIRE) ×1
PACK CARDIAC CATHETERIZATION (CUSTOM PROCEDURE TRAY) ×1 IMPLANT
SET ATX-X65L (MISCELLANEOUS) IMPLANT
SHEATH PROBE COVER 6X72 (BAG) IMPLANT

## 2023-11-25 NOTE — Interval H&P Note (Signed)
History and Physical Interval Note:  11/25/2023 8:33 AM  Mary Holt  has presented today for surgery, with the diagnosis of abnormal stress test.  The various methods of treatment have been discussed with the patient and family. After consideration of risks, benefits and other options for treatment, the patient has consented to  Procedure(s): LEFT HEART CATH AND CORONARY ANGIOGRAPHY (N/A) as a surgical intervention.  The patient's history has been reviewed, patient examined, no change in status, stable for surgery.  I have reviewed the patient's chart and labs.  Questions were answered to the patient's satisfaction.   Cath Lab Visit (complete for each Cath Lab visit)  Clinical Evaluation Leading to the Procedure:   ACS: No.  Non-ACS:    Anginal Classification: CCS II  Anti-ischemic medical therapy: No Therapy  Non-Invasive Test Results: Low-risk stress test findings: cardiac mortality <1%/year  Prior CABG: No previous CABG        [name] [facility] 11/25/2023 8:33 AM

## 2023-12-03 ENCOUNTER — Ambulatory Visit: Payer: PRIVATE HEALTH INSURANCE | Admitting: Cardiology

## 2023-12-09 ENCOUNTER — Ambulatory Visit: Payer: PRIVATE HEALTH INSURANCE | Attending: Cardiology | Admitting: Cardiology

## 2023-12-09 VITALS — BP 126/70 | HR 70 | Ht 63.0 in | Wt 169.2 lb

## 2023-12-09 DIAGNOSIS — E785 Hyperlipidemia, unspecified: Secondary | ICD-10-CM

## 2023-12-09 DIAGNOSIS — I2510 Atherosclerotic heart disease of native coronary artery without angina pectoris: Secondary | ICD-10-CM | POA: Diagnosis not present

## 2023-12-09 DIAGNOSIS — E1100 Type 2 diabetes mellitus with hyperosmolarity without nonketotic hyperglycemic-hyperosmolar coma (NKHHC): Secondary | ICD-10-CM

## 2023-12-09 MED ORDER — BEMPEDOIC ACID-EZETIMIBE 180-10 MG PO TABS: 1.0000 | ORAL_TABLET | Freq: Every day | ORAL | 3 refills | Status: DC

## 2023-12-09 NOTE — Progress Notes (Signed)
Cardiology Office Note:    Date:  12/09/2023   ID:  Mary Holt, DOB November 30, 1961, MRN [id number]  PCP:  [name], FNP  Cardiologist:  Thomasene Ripple, DO  Electrophysiologist:  None   Referring MD: [name], FNP   " I am ok"  History of Present Illness:    Mary Holt is a 62 y.o. female with a hx of coronary artery disease status post DES to the diagonal in the proximal to mid circumflex artery, diabetes most recent hemoglobin A1c 8.9, hyperlipidemia, hypertension, noted mitral valve prolapse here today for follow-up visit.  At her last visit we discussed her Cardiac PET scan which was abnormal. Underwent LHC with no indication for PCI.   Here for follow up. Reports on her right forearm, a dry groove that had been itching. The patient suspects a reaction to a vitamin E cream she had been using.   Past Medical History:  Diagnosis Date   Angina pectoris (HCC) 02/22/2020   CAD in native artery 02/22/2020   Chest pain of uncertain etiology 01/06/2020   Diabetes mellitus without complication (HCC)    Heart murmur    asymptomatic   Hyperlipidemia    Hypertension    Kidney stones    Kidney stones    several times, last  one 2014   Mitral valve prolapse    MVP (mitral valve prolapse)    Pure hypercholesterolemia 05/02/2020   Snoring 01/06/2020    Past Surgical History:  Procedure Laterality Date   BACK SURGERY     x2   BREAST SURGERY     cyst right breast   CARDIAC CATHETERIZATION     CARPAL TUNNEL RELEASE Bilateral    CORONARY STENT INTERVENTION N/A 02/26/2020   Procedure: CORONARY STENT INTERVENTION;  Surgeon: Marykay Lex, MD;  Location: [facility] INVASIVE CV LAB;  Service: Cardiovascular;  Laterality: N/A;   DILATATION & CURRETTAGE/HYSTEROSCOPY WITH RESECTOCOPE N/A 08/11/2013   Procedure: DILATATION & CURETTAGE/HYSTEROSCOPY WITH RESECTOCOPE;  Surgeon: Serita Kyle, MD;  Location: [facility];  Service: Gynecology;  Laterality: N/A;   LEFT HEART CATH AND CORONARY  ANGIOGRAPHY N/A 02/26/2020   Procedure: LEFT HEART CATH AND CORONARY ANGIOGRAPHY;  Surgeon: Marykay Lex, MD;  Location: [facility] INVASIVE CV LAB;  Service: Cardiovascular;  Laterality: N/A;   LEFT HEART CATH AND CORONARY ANGIOGRAPHY N/A 11/25/2023   Procedure: LEFT HEART CATH AND CORONARY ANGIOGRAPHY;  Surgeon: [place], Peter M, MD;  Location: [facility] INVASIVE CV LAB;  Service: Cardiovascular;  Laterality: N/A;   TONSILLECTOMY     TRIGGER FINGER RELEASE     left thumb   TUBAL LIGATION      Current Medications: Current Meds  Medication Sig   aspirin EC 81 MG tablet Take 1 tablet (81 mg total) by mouth daily.   Bempedoic Acid-Ezetimibe 180-10 MG TABS Take 1 tablet by mouth daily.   blood glucose meter kit and supplies KIT Dispense based on patient and insurance preference. Use up to four times daily as directed.   Blood Glucose Monitoring Suppl DEVI 1 each by Does not apply route in the morning, at noon, and at bedtime. May substitute to any manufacturer covered by patient's insurance.   fenofibrate (TRICOR) 145 MG tablet TAKE 1 TABLET(145 MG) BY MOUTH DAILY   JARDIANCE 25 MG TABS tablet TAKE 1 TABLET(25 MG) BY MOUTH DAILY BEFORE BREAKFAST   olmesartan (BENICAR) 40 MG tablet Take 1 tablet (40 mg total) by mouth daily.   rosuvastatin (CRESTOR) 40 MG tablet TAKE  1 TABLET BY MOUTH  DAILY AT 6 PM (Patient taking differently: Take 40 mg by mouth daily.)     Allergies:   Relafen [nabumetone] and Penicillins   Social History   Socioeconomic History   Marital status: Single    Spouse name: Not on file   Number of children: Not on file   Years of education: 12   Highest education level: Not on file  Occupational History   Not on file  Tobacco Use   Smoking status: Never   Smokeless tobacco: Never  Substance and Sexual Activity   Alcohol use: No   Drug use: No   Sexual activity: Not on file  Other Topics Concern   Not on file  Social History Narrative   Not on file   Social Drivers of Health    Financial Resource Strain: Not on file  Food Insecurity: Not on file  Transportation Needs: Not on file  Physical Activity: Not on file  Stress: Not on file  Social Connections: Not on file     Family History: The patient's family history includes Breast cancer in her sister; Heart attack in her brother, brother, and father; Lymphoma in her sister; Ovarian cancer in her sister; Stroke in her mother. There is no history of Colon cancer, Colon polyps, Esophageal cancer, Rectal cancer, or Stomach cancer.  ROS:   Review of Systems  Constitution: Negative for decreased appetite, fever and weight gain.  HENT: Negative for congestion, ear discharge, hoarse voice and sore throat.   Eyes: Negative for discharge, redness, vision loss in right eye and visual halos.  Cardiovascular: Negative for chest pain, dyspnea on exertion, leg swelling, orthopnea and palpitations.  Respiratory: Negative for cough, hemoptysis, shortness of breath and snoring.   Endocrine: Negative for heat intolerance and polyphagia.  Hematologic/Lymphatic: Negative for bleeding problem. Does not bruise/bleed easily.  Skin: Negative for flushing, nail changes, rash and suspicious lesions.  Musculoskeletal: Negative for arthritis, joint pain, muscle cramps, myalgias, neck pain and stiffness.  Gastrointestinal: Negative for abdominal pain, bowel incontinence, diarrhea and excessive appetite.  Genitourinary: Negative for decreased libido, genital sores and incomplete emptying.  Neurological: Negative for brief paralysis, focal weakness, headaches and loss of balance.  Psychiatric/Behavioral: Negative for altered mental status, depression and suicidal ideas.  Allergic/Immunologic: Negative for HIV exposure and persistent infections.    EKGs/Labs/Other Studies Reviewed:    The following studies were reviewed today:   EKG:  The ekg ordered today demonstrates   Recent Labs: 02/14/2023: TSH 1.130 11/12/2023: ALT 14; BUN 17;  Creatinine, Ser 0.68; Hemoglobin 13.2; Magnesium 1.8; Platelets 379; Potassium 4.1; Sodium 141  Recent Lipid Panel    Component Value Date/Time   CHOL 166 11/12/2023 1122   TRIG 57 11/12/2023 1122   HDL 56 11/12/2023 1122   CHOLHDL 3.0 11/12/2023 1122   LDLCALC 99 11/12/2023 1122    Physical Exam:    VS:  BP 126/70 (BP Location: Left Arm, Patient Position: Sitting, Cuff Size: Normal)   Pulse 70   Ht 5\' 3"  (1.6 m)   Wt 169 lb 3.2 oz (76.7 kg)   LMP 09/30/2014   SpO2 98%   BMI 29.97 kg/m     Wt Readings from Last 3 Encounters:  12/09/23 169 lb 3.2 oz (76.7 kg)  11/25/23 170 lb (77.1 kg)  11/12/23 170 lb (77.1 kg)     GEN: Well nourished, well developed in no acute distress HEENT: Normal NECK: No JVD; No carotid bruits LYMPHATICS: No lymphadenopathy CARDIAC: S1S2  noted,RRR, no murmurs, rubs, gallops RESPIRATORY:  Clear to auscultation without rales, wheezing or rhonchi  ABDOMEN: Soft, non-tender, non-distended, +bowel sounds, no guarding. EXTREMITIES: No edema, No cyanosis, no clubbing MUSCULOSKELETAL:  No deformity  SKIN: Warm and dry NEUROLOGIC:  Alert and oriented x 3, non-focal PSYCHIATRIC:  Normal affect, good insight  ASSESSMENT:    1. Hyperlipidemia, unspecified hyperlipidemia type   2. Coronary artery disease involving native coronary artery of native heart without angina pectoris   3. Type 2 diabetes mellitus with hyperosmolarity without coma, without long-term current use of insulin (HCC)    PLAN:    Coronary Artery Disease Continue aspirin and statin Suboptimal LDL control (99) despite high dose Crestor. Discussed the importance of LDL <55 for slowing disease progression. -Add bempedoic acid/Ezetimibe to potentiate lipid-lowering effect of Crestor. -Repeat lipid panel in 12 weeks.  Diabetes Mellitus Discussed the importance of maintaining HbA1c <7 to prevent worsening of heart disease. -Check HbA1c.  Dietary Habits Discussed current diet and  potential improvements. -Refer to nutrition counseling. -Provide dietary suggestions.  Follow-up in 6 months.  The patient is in agreement with the above plan. The patient left the office in stable condition.  The patient will follow up in   Medication Adjustments/Labs and Tests Ordered: Current medicines are reviewed at length with the patient today.  Concerns regarding medicines are outlined above.  Orders Placed This Encounter  Procedures   Lipid panel   Meds ordered this encounter  Medications   Bempedoic Acid-Ezetimibe 180-10 MG TABS    Sig: Take 1 tablet by mouth daily.    Dispense:  90 tablet    Refill:  3    Patient Instructions  Medication Instructions:  Start: Bempedoic Acid-Ezetimibe 180-10 mg tablet, one tablet daily *If you need a refill on your cardiac medications before your next appointment, please call your pharmacy*   Lab Work: Lipid Panel in 12 weeks (FASTING) If you have labs (blood work) drawn today and your tests are completely normal, you will receive your results only by: MyChart Message (if you have MyChart) OR A paper copy in the mail If you have any lab test that is abnormal or we need to change your treatment, we will call you to review the results.   Follow-Up: At [facility], you and your health needs are our priority.  As part of our continuing mission to provide you with exceptional heart care, we have created designated Provider Care Teams.  These Care Teams include your primary Cardiologist (physician) and Advanced Practice Providers (APPs -  Physician Assistants and Nurse Practitioners) who all work together to provide you with the care you need, when you need it.  We recommend signing up for the patient portal called "MyChart".  Sign up information is provided on this After Visit Summary.  MyChart is used to connect with patients for Virtual Visits (Telemedicine).  Patients are able to view lab/test results, encounter notes,  upcoming appointments, etc.  Non-urgent messages can be sent to your provider as well.   To learn more about what you can do with MyChart, go to [email or web address].    Your next appointment:   6 month(s)  Provider:   Thomasene Ripple, DO       Adopting a Healthy Lifestyle.  Know what a healthy weight is for you (roughly BMI <25) and aim to maintain this   Aim for 7+ servings of fruits and vegetables daily   65-80+ fluid ounces of water or unsweet tea for healthy  kidneys   Limit to max 1 drink of alcohol per day; avoid smoking/tobacco   Limit animal fats in diet for cholesterol and heart health - choose grass fed whenever available   Avoid highly processed foods, and foods high in saturated/trans fats   Aim for low stress - take time to unwind and care for your mental health   Aim for 150 min of moderate intensity exercise weekly for heart health, and weights twice weekly for bone health   Aim for 7-9 hours of sleep daily   When it comes to diets, agreement about the perfect plan isnt easy to find, even among the experts. Experts at the [facility] [employer] developed an idea known as the Healthy Eating Plate. Just imagine a plate divided into logical, healthy portions.   The emphasis is on diet quality:   Load up on vegetables and fruits - one-half of your plate: Aim for color and variety, and remember that potatoes dont count.   Go for whole grains - one-quarter of your plate: Whole wheat, barley, wheat berries, quinoa, oats, brown rice, and foods made with them. If you want pasta, go with whole wheat pasta.   Protein power - one-quarter of your plate: Fish, chicken, beans, and nuts are all healthy, versatile protein sources. Limit red meat.   The diet, however, does go beyond the plate, offering a few other suggestions.   Use healthy plant oils, such as olive, canola, soy, corn, sunflower and peanut. Check the labels, and avoid partially hydrogenated oil,  which have unhealthy trans fats.   If youre thirsty, drink water. Coffee and tea are good in moderation, but skip sugary drinks and limit milk and dairy products to one or two daily servings.   The type of carbohydrate in the diet is more important than the amount. Some sources of carbohydrates, such as vegetables, fruits, whole grains, and beans-are healthier than others.   Finally, stay active  Signed, Thomasene Ripple, DO  12/09/2023 3:40 PM    [facility]

## 2023-12-09 NOTE — Patient Instructions (Signed)
Medication Instructions:  Start: Bempedoic Acid-Ezetimibe 180-10 mg tablet, one tablet daily *If you need a refill on your cardiac medications before your next appointment, please call your pharmacy*   Lab Work: Lipid Panel in 12 weeks (FASTING) If you have labs (blood work) drawn today and your tests are completely normal, you will receive your results only by: MyChart Message (if you have MyChart) OR A paper copy in the mail If you have any lab test that is abnormal or we need to change your treatment, we will call you to review the results.   Follow-Up: At [facility], you and your health needs are our priority.  As part of our continuing mission to provide you with exceptional heart care, we have created designated Provider Care Teams.  These Care Teams include your primary Cardiologist (physician) and Advanced Practice Providers (APPs -  Physician Assistants and Nurse Practitioners) who all work together to provide you with the care you need, when you need it.  We recommend signing up for the patient portal called "MyChart".  Sign up information is provided on this After Visit Summary.  MyChart is used to connect with patients for Virtual Visits (Telemedicine).  Patients are able to view lab/test results, encounter notes, upcoming appointments, etc.  Non-urgent messages can be sent to your provider as well.   To learn more about what you can do with MyChart, go to [email or web address].    Your next appointment:   6 month(s)  Provider:   Thomasene Ripple, DO

## 2023-12-11 ENCOUNTER — Telehealth: Payer: Self-pay | Admitting: Nurse Practitioner

## 2023-12-11 NOTE — Telephone Encounter (Signed)
Called pt to schedule appt no answer left VM  

## 2023-12-20 ENCOUNTER — Other Ambulatory Visit (HOSPITAL_COMMUNITY): Payer: Self-pay

## 2023-12-20 ENCOUNTER — Telehealth: Payer: Self-pay

## 2023-12-20 NOTE — Telephone Encounter (Signed)
Pharmacy Patient Advocate Encounter   Received notification from CoverMyMeds that prior authorization for NEXLIZET is required/requested.   Insurance verification completed.   The patient is insured through [facility] .   Per test claim: PA required; PA submitted to above mentioned insurance via CoverMyMeds Key/confirmation #/EOC [id number] Status is pending

## 2023-12-23 NOTE — Telephone Encounter (Signed)
Pharmacy Patient Advocate Encounter  Received notification from [facility] that Prior Authorization for NEXLIZET has been DENIED.  Full denial letter will be uploaded to the media tab. See denial reason below.  DENIED FOR NOT TRYING ZETIA FOR AT LEAST 12 WEEKS PRIOR TO REQUEST

## 2023-12-30 ENCOUNTER — Ambulatory Visit: Payer: PRIVATE HEALTH INSURANCE | Admitting: Nurse Practitioner

## 2023-12-30 NOTE — Progress Notes (Deleted)
 LILLETTE Kristeen JINNY Gladis, CMA,acting as a neurosurgeon for [name], FNP.,have documented all relevant documentation on the behalf of [name], FNP,as directed by  [name], FNP while in the presence of [name], FNP.  Subjective:  Patient ID: Mary Holt , female    DOB: 18-Jun-1961 , 63 y.o.   MRN: [id number]  No chief complaint on file.   HPI  Patient presents today for a bp and dm follow up, Patient reports compliance with medication. Patient denies any chest pain, SOB, or headaches. Patient has no concerns today.     Past Medical History:  Diagnosis Date  . Angina pectoris (HCC) 02/22/2020  . CAD in native artery 02/22/2020  . Chest pain of uncertain etiology 01/06/2020  . Diabetes mellitus without complication (HCC)   . Heart murmur    asymptomatic  . Hyperlipidemia   . Hypertension   . Kidney stones   . Kidney stones    several times, last  one 2014  . Mitral valve prolapse   . MVP (mitral valve prolapse)   . Pure hypercholesterolemia 05/02/2020  . Snoring 01/06/2020     Family History  Problem Relation Age of Onset  . Stroke Mother   . Heart attack Father   . Breast cancer Sister   . Heart attack Brother   . Ovarian cancer Sister   . Lymphoma Sister   . Heart attack Brother   . Colon cancer Neg Hx   . Colon polyps Neg Hx   . Esophageal cancer Neg Hx   . Rectal cancer Neg Hx   . Stomach cancer Neg Hx      Current Outpatient Medications:  .  aspirin  EC 81 MG tablet, Take 1 tablet (81 mg total) by mouth daily., Disp: 90 tablet, Rfl: 3 .  Bempedoic Acid -Ezetimibe  180-10 MG TABS, Take 1 tablet by mouth daily., Disp: 90 tablet, Rfl: 3 .  blood glucose meter kit and supplies KIT, Dispense based on patient and insurance preference. Use up to four times daily as directed., Disp: 1 each, Rfl: 0 .  Blood Glucose Monitoring Suppl DEVI, 1 each by Does not apply route in the morning, at noon, and at bedtime. May substitute to any manufacturer covered by patient's  insurance., Disp: 1 each, Rfl: 0 .  fenofibrate  (TRICOR ) 145 MG tablet, TAKE 1 TABLET(145 MG) BY MOUTH DAILY, Disp: 90 tablet, Rfl: 3 .  JARDIANCE  25 MG TABS tablet, TAKE 1 TABLET(25 MG) BY MOUTH DAILY BEFORE BREAKFAST, Disp: 90 tablet, Rfl: 1 .  nitroGLYCERIN  (NITROSTAT ) 0.4 MG SL tablet, Place 1 tablet (0.4 mg total) under the tongue every 5 (five) minutes as needed for chest pain., Disp: 20 tablet, Rfl: 3 .  olmesartan  (BENICAR ) 40 MG tablet, Take 1 tablet (40 mg total) by mouth daily., Disp: 90 tablet, Rfl: 1 .  rosuvastatin  (CRESTOR ) 40 MG tablet, TAKE 1 TABLET BY MOUTH  DAILY AT 6 PM (Patient taking differently: Take 40 mg by mouth daily.), Disp: 90 tablet, Rfl: 3   Allergies  Allergen Reactions  . Relafen [Nabumetone] Rash  . Penicillins Rash     Review of Systems   There were no vitals filed for this visit. There is no height or weight on file to calculate BMI.  Wt Readings from Last 3 Encounters:  12/09/23 169 lb 3.2 oz (76.7 kg)  11/25/23 170 lb (77.1 kg)  11/12/23 170 lb (77.1 kg)    The 10-year ASCVD risk score (Arnett DK, et al., 2019) is: 15%  Values used to calculate the score:     Age: 55 years     Sex: Female     Is Non-Hispanic African American: Yes     Diabetic: Yes     Tobacco smoker: No     Systolic Blood Pressure: 126 mmHg     Is BP treated: Yes     HDL Cholesterol: 56 mg/dL     Total Cholesterol: 166 mg/dL  Objective:  Physical Exam      Assessment And Plan:  Essential hypertension  Type 2 diabetes mellitus with other circulatory complication, without long-term current use of insulin  (HCC)    No follow-ups on file.  Patient was given opportunity to ask questions. Patient verbalized understanding of the plan and was able to repeat key elements of the plan. All questions were answered to their satisfaction.    LILLETTE [name], FNP, have reviewed all documentation for this visit. The documentation on 12/30/23 for the exam, diagnosis, procedures,  and orders are all accurate and complete.   IF YOU HAVE BEEN REFERRED TO A SPECIALIST, IT MAY TAKE 1-2 WEEKS TO SCHEDULE/PROCESS THE REFERRAL. IF YOU HAVE NOT HEARD FROM US /SPECIALIST IN TWO WEEKS, PLEASE GIVE US  A CALL AT [phone].

## 2024-01-08 ENCOUNTER — Telehealth: Payer: Self-pay | Admitting: Cardiology

## 2024-01-08 NOTE — Telephone Encounter (Signed)
 Pt c/o medication issue:  1. Name of Medication: Bempedoic Acid -Ezetimibe  180-10 MG TABS   2. How are you currently taking this medication (dosage and times per day)? Not taking  3. Are you having a reaction (difficulty breathing--STAT)? No   4. What is your medication issue? Ins denied this medication

## 2024-01-08 NOTE — Telephone Encounter (Signed)
 Called and spoke to patient. Verified name and DOB She is calling to notify Dr Emmette Harms that insurance denied PA for  Bempedoic Acid -Ezetimibe  180-10 MG TABS. According to PA medication was denied. Patient would like to know the next step. Please advise.   DENIED FOR NOT TRYING ZETIA  FOR AT LEAST 12 WEEKS PRIOR TO REQUEST

## 2024-01-19 ENCOUNTER — Emergency Department (HOSPITAL_COMMUNITY): Payer: PRIVATE HEALTH INSURANCE

## 2024-01-19 ENCOUNTER — Emergency Department (HOSPITAL_COMMUNITY)
Admission: EM | Admit: 2024-01-19 | Discharge: 2024-01-19 | Disposition: A | Discharge status: Home / Self Care | Payer: PRIVATE HEALTH INSURANCE | Attending: Emergency Medicine | Admitting: Emergency Medicine

## 2024-01-19 ENCOUNTER — Other Ambulatory Visit: Payer: Self-pay

## 2024-01-19 ENCOUNTER — Encounter (HOSPITAL_COMMUNITY): Payer: Self-pay

## 2024-01-19 DIAGNOSIS — I2510 Atherosclerotic heart disease of native coronary artery without angina pectoris: Secondary | ICD-10-CM | POA: Insufficient documentation

## 2024-01-19 DIAGNOSIS — I10 Essential (primary) hypertension: Secondary | ICD-10-CM | POA: Insufficient documentation

## 2024-01-19 DIAGNOSIS — E119 Type 2 diabetes mellitus without complications: Secondary | ICD-10-CM | POA: Diagnosis not present

## 2024-01-19 DIAGNOSIS — M545 Low back pain, unspecified: Secondary | ICD-10-CM | POA: Insufficient documentation

## 2024-01-19 LAB — BASIC METABOLIC PANEL
Anion gap: 15 (ref 5–15)
BUN: 14 mg/dL (ref 8–23)
CO2: 18 mmol/L — ABNORMAL LOW (ref 22–32)
Calcium: 9.6 mg/dL (ref 8.9–10.3)
Chloride: 107 mmol/L (ref 98–111)
Creatinine, Ser: 0.75 mg/dL (ref 0.44–1.00)
GFR, Estimated: >60 mL/min (ref >60)
Glucose, Bld: 130 mg/dL — ABNORMAL HIGH (ref 70–99)
Potassium: 3.5 mmol/L (ref 3.5–5.1)
Sodium: 140 mmol/L (ref 135–145)

## 2024-01-19 MED ORDER — LIDOCAINE 5 % EX PTCH
1.0000 | MEDICATED_PATCH | CUTANEOUS | Status: DC
  Administered 2024-01-19: 1 via TRANSDERMAL
  Filled 2024-01-19: qty 1

## 2024-01-19 MED ORDER — OXYCODONE-ACETAMINOPHEN 5-325 MG PO TABS
1.0000 | ORAL_TABLET | ORAL | Status: DC | PRN
Start: 2024-01-19 — End: 2024-01-20
  Administered 2024-01-19: 1 via ORAL
  Filled 2024-01-19: qty 1

## 2024-01-19 MED ORDER — TIZANIDINE HCL 4 MG PO TABS: 2.0000 mg | ORAL_TABLET | Freq: Three times a day (TID) | ORAL | 0 refills | Status: AC | PRN

## 2024-01-19 MED ORDER — ACETAMINOPHEN 325 MG PO TABS
650.0000 mg | ORAL_TABLET | Freq: Once | ORAL | Status: AC
  Administered 2024-01-19: 650 mg via ORAL
  Filled 2024-01-19: qty 2

## 2024-01-19 MED ORDER — TIZANIDINE HCL 4 MG PO TABS
2.0000 mg | ORAL_TABLET | Freq: Once | ORAL | Status: AC
  Administered 2024-01-19: 2 mg via ORAL
  Filled 2024-01-19: qty 1

## 2024-01-19 MED ORDER — OXYCODONE HCL 5 MG PO TABS
5.0000 mg | ORAL_TABLET | Freq: Once | ORAL | Status: AC
  Administered 2024-01-19: 5 mg via ORAL
  Filled 2024-01-19: qty 1

## 2024-01-19 MED ORDER — OXYCODONE-ACETAMINOPHEN 5-325 MG PO TABS: 1.0000 | ORAL_TABLET | Freq: Three times a day (TID) | ORAL | 0 refills | Status: AC | PRN

## 2024-01-19 MED ORDER — OXYCODONE HCL 5 MG PO TABS: 5.0000 mg | ORAL_TABLET | Freq: Once | ORAL | Status: DC

## 2024-01-19 NOTE — ED Provider Triage Note (Signed)
Emergency Medicine Provider Triage Evaluation Note  DEBARA KAMPHUIS , a 63 y.o. female  was evaluated in triage.  Pt complains of low back pain radiates to anterior thighs x 3 days. Was initially R sided but is currently L sided. Describes pain as "muscle spasm." Denies recent trauma. Hx of muscle spasms. Tried tylenol and ibuprofen w/out relief. Has appointment with spine surgeon tomorrow and appointment with internal medicine on 01/21/2024.  Endorses nausea  Denies fevers, chest pain, shortness of breath, abdominal pain, vomiting, diarrhea, dysuria, incontinence, constipation, hematuria, numbness/tingling in LE, LE swelling.   Review of Systems  Positive: See above Negative: See above  Physical Exam  BP (!) 142/87 (BP Location: Right Arm)   Pulse (!) 108   Temp 99.1 F (37.3 C) (Oral)   Resp 16   Ht 5\' 3"  (1.6 m)   Wt 76.7 kg   LMP 09/30/2014   SpO2 97%   BMI 29.94 kg/m  Gen:   Awake, no distress   Resp:  Normal effort  MSK:   Moves extremities without difficulty  Other:    Medical Decision Making  Medically screening exam initiated at 1:48 PM.  Appropriate orders placed.  [name] was informed that the remainder of the evaluation will be completed by another provider, this initial triage assessment does not replace that evaluation, and the importance of remaining in the ED until their evaluation is complete.     Lunette Stands, [place] 01/19/24 1353

## 2024-01-19 NOTE — Discharge Instructions (Addendum)
You were seen in the emergency department for back pain.  Test performed while you are here included blood work and CT scan.  Your CT did show chronic degenerative changes but no acute fractures.  You can take Tylenol and ibuprofen as needed for pain.  You can additionally obtain lidocaine patches over-the-counter at any grocery store or pharmacy.  We sent a prescription for stronger pain medication and muscle relaxant that you can also use as needed, however please do not drive or operate any machinery while using these medications as they can make you very tired. Please attend the appointment with your spine doctor as scheduled tomorrow and additionally follow-up with your PCP in the next 2-3 days for reevaluation. If you experience significantly increased pain, new numbness or tingling, fevers with back pain, bowel or bladder incontinence, or any other concerns, return to the ED for reevaluation.

## 2024-01-19 NOTE — ED Provider Notes (Signed)
EMERGENCY DEPARTMENT AT [facility] Provider Note  MDM   HPI/ROS:  Mary Holt is a 63 y.o. female with pertinent past medical history of chronic back pain with multiple prior back surgeries, DM, CAD s/p PCI, HTN, HLD, mitral valve prolapse who presents for evaluation of back pain.  Patient reports a 3-day history of back pain that began while she was lying in bed.  She does note history of chronic back pain however states this is significantly worse from prior.  She states that initially began over her middle/right back but now extends in a band across the entire back.  Pain is worse with any movements and she experiences intermittent severe muscle spasms.  She has tried taking over-the-counter analgesia without any improvement of symptoms.  Physical exam is notable for: - Significant lower lumbar midline tenderness to palpation with mild right paraspinal and left lower paraspinal and left SI joint tenderness to palpation -- Full strength of bilateral upper and lower extremities with sensation intact to light touch throughout --Negative straight leg raise -- No saddle anesthesia  On my initial evaluation, patient is:  -Vital signs stable. Patient afebrile, hemodynamically stable, and non-toxic appearing. -Additional history obtained from patient's son  Plan to obtain CT of the L-spine to evaluate for possible compression fracture given significant focal tenderness.  If negative, suspect patient likely has exacerbation of her chronic musculoskeletal back pain.  No red flag symptoms that would concern for acute cord compression, cauda equina, spinal epidural abscess or hematoma.  Unlikely UTI, nephrolithiasis though will obtain UA to better assess.  BMP was obtained and with normal creatinine, mild acidosis though without elevated anion gap.  Patient given multimodal pain control.    Interpretations, interventions, and the patient's course of care are documented below.    -Imaging reviewed: CT lumbar spine with degenerative changes but no acute fractures and intact lumbar hardware -Patient reevaluated and reporting improvement of pain after multimodal pain control.  Has been able to ambulate -Patient has appointment with her primary spine doctor tomorrow  Plan to discharge with short course of muscle relaxer and narcotic pain medication for breakthrough pain.  She was advised to follow-up with her spine doctor as scheduled, as well as with her PCP.  Patient and son were comfortable with this plan.  Strict return precautions were provided, including regarding red flag symptoms for back pain.  Patient remained stable and had no acute events while under my care in the emergency department.  Medications administered: Medications  oxyCODONE-acetaminophen (PERCOCET/ROXICET) 5-325 MG per tablet 1 tablet (1 tablet Oral Given 01/19/24 1411)  lidocaine (LIDODERM) 5 % 1 patch (1 patch Transdermal Patch Applied 01/19/24 1925)  oxyCODONE (Oxy IR/ROXICODONE) immediate release tablet 5 mg (5 mg Oral Given 01/19/24 1925)  tiZANidine (ZANAFLEX) tablet 2 mg (2 mg Oral Given 01/19/24 1925)  acetaminophen (TYLENOL) tablet 650 mg (650 mg Oral Given 01/19/24 1924)     Disposition:  I discussed the plan for discharge with the patient and/or their surrogate at bedside prior to discharge and they were in agreement with the plan and verbalized understanding of the return precautions provided. All questions answered to the best of my ability. Ultimately, the patient was discharged in stable condition with stable vital signs. I am reassured that they are capable of close follow up and good social support at home.   Clinical Impression:  1. Acute midline low back pain without sciatica     Rx / DC Orders ED Discharge Orders  Ordered    oxyCODONE-acetaminophen (PERCOCET/ROXICET) 5-325 MG tablet  Every 8 hours PRN        01/19/24 2245    tiZANidine (ZANAFLEX) 4 MG tablet  Every 8  hours PRN        01/19/24 2245            The plan for this patient was discussed with Dr. Doran Durand, who voiced agreement and who oversaw evaluation and treatment of this patient.   Clinical Complexity A medically appropriate history, review of systems, and physical exam was performed.  My independent interpretations of EKG, labs, and radiology are documented in the ED course above.   If decision rules were used in this patient's evaluation, they are listed below.   Patient's presentation is most consistent with acute presentation with potential threat to life or bodily function.  Medical Decision Making Amount and/or Complexity of Data Reviewed Radiology: ordered.  Risk OTC drugs. Prescription drug management.    HPI/ROS      See MDM section for pertinent HPI and ROS. A complete ROS was performed with pertinent positives/negatives noted above.   Past Medical History:  Diagnosis Date   Angina pectoris (HCC) 02/22/2020   CAD in native artery 02/22/2020   Chest pain of uncertain etiology 01/06/2020   Diabetes mellitus without complication (HCC)    Heart murmur    asymptomatic   Hyperlipidemia    Hypertension    Kidney stones    Kidney stones    several times, last  one 2014   Mitral valve prolapse    MVP (mitral valve prolapse)    Pure hypercholesterolemia 05/02/2020   Snoring 01/06/2020    Past Surgical History:  Procedure Laterality Date   BACK SURGERY     x2   BREAST SURGERY     cyst right breast   CARDIAC CATHETERIZATION     CARPAL TUNNEL RELEASE Bilateral    CORONARY STENT INTERVENTION N/A 02/26/2020   Procedure: CORONARY STENT INTERVENTION;  Surgeon: Marykay Lex, MD;  Location: [facility] INVASIVE CV LAB;  Service: Cardiovascular;  Laterality: N/A;   DILATATION & CURRETTAGE/HYSTEROSCOPY WITH RESECTOCOPE N/A 08/11/2013   Procedure: DILATATION & CURETTAGE/HYSTEROSCOPY WITH RESECTOCOPE;  Surgeon: Serita Kyle, MD;  Location: [facility] ORS;  Service: Gynecology;   Laterality: N/A;   LEFT HEART CATH AND CORONARY ANGIOGRAPHY N/A 02/26/2020   Procedure: LEFT HEART CATH AND CORONARY ANGIOGRAPHY;  Surgeon: Marykay Lex, MD;  Location: [facility] INVASIVE CV LAB;  Service: Cardiovascular;  Laterality: N/A;   LEFT HEART CATH AND CORONARY ANGIOGRAPHY N/A 11/25/2023   Procedure: LEFT HEART CATH AND CORONARY ANGIOGRAPHY;  Surgeon: [place], Peter M, MD;  Location: [facility] INVASIVE CV LAB;  Service: Cardiovascular;  Laterality: N/A;   TONSILLECTOMY     TRIGGER FINGER RELEASE     left thumb   TUBAL LIGATION        Physical Exam   Vitals:   01/19/24 1335 01/19/24 1341  BP: (!) 142/87   Pulse: (!) 108   Resp: 16   Temp: 99.1 F (37.3 C)   TempSrc: Oral   SpO2: 97%   Weight:  76.7 kg  Height:  5\' 3"  (1.6 m)    Physical Exam Gen: NAD. Appears uncomfortable HENT: Conjunctiva clear, PERRL, EOMI. MMM.  CV: RRR. No M/R/G Pulm: Lungs CTAB with no wheezing, rales, or rhonchi.  GI: Abdomen soft, non-tender, non-distended. Back: No cervical or thoracic midline spinal tenderness to palpation.  Significant lower lumbar midline tenderness to palpation.  Mild bilateral  lower paraspinal and right SI joint tenderness to palpation.  No CVA tenderness bilaterally. MSK/Skin: No lower extremity edema. Extremities warm, well-perfused with 2+ pulses in all 4 extremities. Neuro: A&Ox3. GCS 15. Moves all extremities.  5/5 strength in bilateral upper and lower extremities with sensation intact to light touch throughout.  No saddle anesthesia.    Procedures   If procedures were preformed on this patient, they are listed below:  Procedures   Mikeal Hawthorne, MD Emergency Medicine PGY-2   Please note that this documentation was produced with the assistance of voice-to-text technology and may contain errors.    Mikeal Hawthorne, MD 01/20/24 [id number]    Glyn Ade, MD 01/20/24 [phone]

## 2024-01-19 NOTE — ED Triage Notes (Signed)
Hx of back surgeries but Friday during the night started with severe lower back on on the right but has now moved to the entire lower back with pain radiating to both thighs.  Denies urinary symptoms.  Reports spasms that make her scream out and tylenol and motrin are not working.

## 2024-01-20 ENCOUNTER — Telehealth: Payer: Self-pay

## 2024-01-20 NOTE — Telephone Encounter (Signed)
Patient called to reschedule appointment due to back pain - patient was rescheduled until 2/18 patient was notified if she didn't make the 2/18 appointment she will be discharged due to not being seen in a a year and many missed and rescheduled appointments.

## 2024-01-21 ENCOUNTER — Ambulatory Visit: Payer: PRIVATE HEALTH INSURANCE | Admitting: Nurse Practitioner

## 2024-02-11 ENCOUNTER — Encounter: Payer: Self-pay | Admitting: Nurse Practitioner

## 2024-02-11 ENCOUNTER — Ambulatory Visit (INDEPENDENT_AMBULATORY_CARE_PROVIDER_SITE_OTHER): Payer: PRIVATE HEALTH INSURANCE | Admitting: Nurse Practitioner

## 2024-02-11 VITALS — BP 120/80 | HR 86 | Temp 98.6°F | Ht 63.0 in | Wt 169.8 lb

## 2024-02-11 DIAGNOSIS — E6609 Other obesity due to excess calories: Secondary | ICD-10-CM

## 2024-02-11 DIAGNOSIS — I119 Hypertensive heart disease without heart failure: Secondary | ICD-10-CM | POA: Diagnosis not present

## 2024-02-11 DIAGNOSIS — Z2821 Immunization not carried out because of patient refusal: Secondary | ICD-10-CM

## 2024-02-11 DIAGNOSIS — E1159 Type 2 diabetes mellitus with other circulatory complications: Secondary | ICD-10-CM | POA: Diagnosis not present

## 2024-02-11 DIAGNOSIS — E66811 Obesity, class 1: Secondary | ICD-10-CM | POA: Diagnosis not present

## 2024-02-11 DIAGNOSIS — E7800 Pure hypercholesterolemia, unspecified: Secondary | ICD-10-CM | POA: Diagnosis not present

## 2024-02-11 DIAGNOSIS — Z6830 Body mass index (BMI) 30.0-30.9, adult: Secondary | ICD-10-CM

## 2024-02-11 MED ORDER — OZEMPIC (0.25 OR 0.5 MG/DOSE) 2 MG/1.5ML ~~LOC~~ SOPN: 0.5000 mg | PEN_INJECTOR | SUBCUTANEOUS | 1 refills | Status: DC

## 2024-02-11 NOTE — Progress Notes (Signed)
 [name], CMA,acting as a Neurosurgeon for Mary Felts, FNP.,have documented all relevant documentation on the behalf of Mary Felts, FNP,as directed by  Mary Felts, FNP while in the presence of Mary Felts, FNP.  Subjective:  Patient ID: Mary Holt , female    DOB: Mar 13, 1961 , 63 y.o.   MRN: [id number]  Chief Complaint  Patient presents with   Hypertension    HPI  Patient presents today for a bp and dm follow up, Patient reports compliance with medication. Patient denies any chest pain, SOB, or headaches. Patient has no concerns today. She is having problems with her back and is supposed to have an MRI of her back with Dr. Franky Macho. She has continued with the cardiologist.      Past Medical History:  Diagnosis Date   Angina pectoris (HCC) 02/22/2020   CAD in native artery 02/22/2020   Chest pain of uncertain etiology 01/06/2020   Diabetes mellitus without complication (HCC)    Heart murmur    asymptomatic   Hyperlipidemia    Hypertension    Kidney stones    Kidney stones    several times, last  one 2014   Mitral valve prolapse    MVP (mitral valve prolapse)    Pure hypercholesterolemia 05/02/2020   Snoring 01/06/2020     Family History  Problem Relation Age of Onset   Stroke Mother    Heart attack Father    Breast cancer Sister    Heart attack Brother    Ovarian cancer Sister    Lymphoma Sister    Heart attack Brother    Colon cancer Neg Hx    Colon polyps Neg Hx    Esophageal cancer Neg Hx    Rectal cancer Neg Hx    Stomach cancer Neg Hx      Current Outpatient Medications:    aspirin EC 81 MG tablet, Take 1 tablet (81 mg total) by mouth daily., Disp: 90 tablet, Rfl: 3   Bempedoic Acid-Ezetimibe 180-10 MG TABS, Take 1 tablet by mouth daily., Disp: 90 tablet, Rfl: 3   blood glucose meter kit and supplies KIT, Dispense based on patient and insurance preference. Use up to four times daily as directed., Disp: 1 each, Rfl: 0   Blood Glucose Monitoring Suppl  DEVI, 1 each by Does not apply route in the morning, at noon, and at bedtime. May substitute to any manufacturer covered by patient's insurance., Disp: 1 each, Rfl: 0   fenofibrate (TRICOR) 145 MG tablet, TAKE 1 TABLET(145 MG) BY MOUTH DAILY, Disp: 90 tablet, Rfl: 3   JARDIANCE 25 MG TABS tablet, TAKE 1 TABLET(25 MG) BY MOUTH DAILY BEFORE BREAKFAST, Disp: 90 tablet, Rfl: 1   olmesartan (BENICAR) 40 MG tablet, Take 1 tablet (40 mg total) by mouth daily., Disp: 90 tablet, Rfl: 1   oxyCODONE-acetaminophen (PERCOCET/ROXICET) 5-325 MG tablet, Take 1 tablet by mouth every 8 (eight) hours as needed for up to 9 doses for severe pain (pain score 7-10)., Disp: 9 tablet, Rfl: 0   rosuvastatin (CRESTOR) 40 MG tablet, TAKE 1 TABLET BY MOUTH  DAILY AT 6 PM (Patient taking differently: Take 40 mg by mouth daily.), Disp: 90 tablet, Rfl: 3   Semaglutide,0.25 or 0.5MG /DOS, (OZEMPIC, 0.25 OR 0.5 MG/DOSE,) 2 MG/1.5ML SOPN, Inject 0.5 mg into the skin once a week., Disp: 4.5 mL, Rfl: 1   tiZANidine (ZANAFLEX) 4 MG tablet, Take 0.5 tablets (2 mg total) by mouth every 8 (eight) hours as needed for up to  10 doses for muscle spasms., Disp: 5 tablet, Rfl: 0   nitroGLYCERIN (NITROSTAT) 0.4 MG SL tablet, Place 1 tablet (0.4 mg total) under the tongue every 5 (five) minutes as needed for chest pain., Disp: 20 tablet, Rfl: 3   Allergies  Allergen Reactions   Relafen [Nabumetone] Rash   Penicillins Rash     Review of Systems  Constitutional: Negative.   HENT: Negative.    Eyes: Negative.   Respiratory: Negative.    Cardiovascular: Negative.   Gastrointestinal: Negative.   Endocrine: Positive for polyphagia. Negative for polydipsia and polyuria.  Neurological: Negative.   Psychiatric/Behavioral: Negative.       Today's Vitals   02/11/24 1534  BP: 120/80  Pulse: 86  Temp: 98.6 F (37 C)  TempSrc: Oral  Weight: 169 lb 12.8 oz (77 kg)  Height: 5\' 3"  (1.6 m)  PainSc: 0-No pain   Body mass index is 30.08 kg/m.   Wt Readings from Last 3 Encounters:  02/11/24 169 lb 12.8 oz (77 kg)  01/19/24 169 lb (76.7 kg)  12/09/23 169 lb 3.2 oz (76.7 kg)    Objective:  Physical Exam Vitals reviewed.  Constitutional:      General: She is not in acute distress.    Appearance: Normal appearance. She is well-developed. She is obese.  HENT:     Head: Normocephalic and atraumatic.     Right Ear: Hearing normal.     Left Ear: Hearing normal.  Eyes:     General: Lids are normal.     Pupils: Pupils are equal, round, and reactive to light.     Funduscopic exam:    Right eye: No papilledema.        Left eye: No papilledema.  Neck:     Thyroid: No thyroid mass.     Vascular: No carotid bruit.  Cardiovascular:     Rate and Rhythm: Normal rate and regular rhythm.     Pulses: Normal pulses.     Heart sounds: Normal heart sounds. No murmur heard. Pulmonary:     Effort: Pulmonary effort is normal. No respiratory distress.     Breath sounds: Normal breath sounds. No wheezing.  Musculoskeletal:     Cervical back: Full passive range of motion without pain.     Left lower leg: No edema.  Skin:    General: Skin is warm and dry.     Capillary Refill: Capillary refill takes less than 2 seconds.  Neurological:     General: No focal deficit present.     Mental Status: She is alert and oriented to person, place, and time.     Cranial Nerves: No cranial nerve deficit.     Sensory: No sensory deficit.  Psychiatric:        Mood and Affect: Mood normal.        Behavior: Behavior normal.        Thought Content: Thought content normal.        Judgment: Judgment normal.         Assessment And Plan:  Hypertensive heart disease without heart failure Assessment & Plan: Blood pressure is better controlled.  Continue current medications and follow-up with cardiology   Type 2 diabetes mellitus with other circulatory complication, without long-term current use of insulin (HCC) Assessment & Plan: She has only been  taking Jardiance.  She is willing to start Ozempic at 0.25 mg x 4 weeks then increase to 0.5 mg weekly.  Discussed side effects to include constipation and nausea.  Also discussed the benefit of heart protection.  Expressed to her the importance of adherence to care and treatment to prevent complications related to diabetes.  Orders: -     Hemoglobin A1c -     Microalbumin / creatinine urine ratio -     Ozempic (0.25 or 0.5 MG/DOSE); Inject 0.5 mg into the skin once a week.  Dispense: 4.5 mL; Refill: 1  Pure hypercholesterolemia Assessment & Plan: Chronic, continue statin.  Cholesterol levels are improving.  Orders: -     Lipid panel  COVID-19 vaccination declined Assessment & Plan: Declines covid 19 vaccine. Discussed risk of covid 84 and if she changes her mind about the vaccine to call the office. Education has been provided regarding the importance of this vaccine but patient still declined. Advised may receive this vaccine at local pharmacy or Health Dept.or vaccine clinic. Aware to provide a copy of the vaccination record if obtained from local pharmacy or Health Dept.  Encouraged to take multivitamin, vitamin d, vitamin c and zinc to increase immune system. Aware can call office if would like to have vaccine here at office. Verbalized acceptance and understanding.    Class 1 obesity due to excess calories with serious comorbidity and body mass index (BMI) of 30.0 to 30.9 in adult Assessment & Plan: She is encouraged to strive for BMI less than 30 to decrease cardiac risk. Advised to aim for at least 150 minutes of exercise per week.      Return for controlled DM check 4 months.  Patient was given opportunity to ask questions. Patient verbalized understanding of the plan and was able to repeat key elements of the plan. All questions were answered to their satisfaction.    Jeanell Sparrow, FNP, have reviewed all documentation for this visit. The documentation on 02/11/24 for the  exam, diagnosis, procedures, and orders are all accurate and complete.   IF YOU HAVE BEEN REFERRED TO A SPECIALIST, IT MAY TAKE 1-2 WEEKS TO SCHEDULE/PROCESS THE REFERRAL. IF YOU HAVE NOT HEARD FROM US/SPECIALIST IN TWO WEEKS, PLEASE GIVE [place] A CALL AT [phone].

## 2024-02-12 LAB — HEMOGLOBIN A1C
Est. average glucose Bld gHb Est-mCnc: 174 mg/dL
Hgb A1c MFr Bld: 7.7 % — ABNORMAL HIGH (ref 4.8–5.6)

## 2024-02-12 LAB — LIPID PANEL
Chol/HDL Ratio: 3.1 {ratio} (ref 0.0–4.4)
Cholesterol, Total: 162 mg/dL (ref 100–199)
HDL: 52 mg/dL (ref >39)
LDL Chol Calc (NIH): 96 mg/dL (ref 0–99)
Triglycerides: 71 mg/dL (ref 0–149)
VLDL Cholesterol Cal: 14 mg/dL (ref 5–40)

## 2024-02-13 LAB — MICROALBUMIN / CREATININE URINE RATIO
Creatinine, Urine: 120.8 mg/dL
Microalb/Creat Ratio: 15 mg/g{creat} (ref 0–29)
Microalbumin, Urine: 18.3 ug/mL

## 2024-02-19 ENCOUNTER — Other Ambulatory Visit: Payer: Self-pay | Admitting: Nurse Practitioner

## 2024-02-21 DIAGNOSIS — E66811 Obesity, class 1: Secondary | ICD-10-CM | POA: Insufficient documentation

## 2024-02-21 DIAGNOSIS — E119 Type 2 diabetes mellitus without complications: Secondary | ICD-10-CM | POA: Insufficient documentation

## 2024-02-21 DIAGNOSIS — Z2821 Immunization not carried out because of patient refusal: Secondary | ICD-10-CM | POA: Insufficient documentation

## 2024-02-21 NOTE — Assessment & Plan Note (Addendum)
 Blood pressure is better controlled.  Continue current medications and follow-up with cardiology

## 2024-02-21 NOTE — Assessment & Plan Note (Signed)

## 2024-02-21 NOTE — Assessment & Plan Note (Signed)
 She is encouraged to strive for BMI less than 30 to decrease cardiac risk. Advised to aim for at least 150 minutes of exercise per week.

## 2024-02-21 NOTE — Assessment & Plan Note (Signed)
 Chronic, continue statin.  Cholesterol levels are improving.

## 2024-02-21 NOTE — Assessment & Plan Note (Signed)
 She has only been taking Jardiance.  She is willing to start Ozempic at 0.25 mg x 4 weeks then increase to 0.5 mg weekly.  Discussed side effects to include constipation and nausea.  Also discussed the benefit of heart protection.  Expressed to her the importance of adherence to care and treatment to prevent complications related to diabetes.

## 2024-03-05 ENCOUNTER — Other Ambulatory Visit: Payer: Self-pay | Admitting: Nurse Practitioner

## 2024-03-05 MED ORDER — OLMESARTAN MEDOXOMIL 40 MG PO TABS: 40.0000 mg | ORAL_TABLET | Freq: Every day | ORAL | 1 refills | Status: DC

## 2024-03-05 MED ORDER — ROSUVASTATIN CALCIUM 40 MG PO TABS: ORAL_TABLET | ORAL | 3 refills | Status: AC

## 2024-03-05 NOTE — Telephone Encounter (Signed)
 Copied from CRM [id number]. Topic: Clinical - Medication Refill >> Mar 05, 2024 11:27 AM Martha Clan wrote: Most Recent Primary Care Visit:  Provider: [name]  Department: [facility]  Visit Type: OFFICE VISIT  Date: 02/11/2024  Medication: olmesartan (BENICAR) 40 MG tablet [[id number]]  Has the patient contacted their pharmacy? Yes (Agent: If no, request that the patient contact the pharmacy for the refill. If patient does not wish to contact the pharmacy document the reason why and proceed with request.) (Agent: If yes, when and what did the pharmacy advise?)  Is this the correct pharmacy for this prescription? Yes If no, delete pharmacy and type the correct one.  This is the patient's preferred pharmacy:  [facility] [id number] - [place], [place] - [address] AT [facility] [address] [place] [address] Phone: [phone] Fax: [phone]   Has the prescription been filled recently? No  Is the patient out of the medication? Yes  Has the patient been seen for an appointment in the last year OR does the patient have an upcoming appointment? Yes  Can we respond through MyChart? No  Agent: Please be advised that Rx refills may take up to 3 business days. We ask that you follow-up with your pharmacy.

## 2024-03-23 ENCOUNTER — Telehealth: Payer: Self-pay | Admitting: *Deleted

## 2024-03-23 ENCOUNTER — Other Ambulatory Visit: Payer: Self-pay | Admitting: Neurosurgery

## 2024-03-23 NOTE — Telephone Encounter (Signed)
   Pre-operative Risk Assessment    Patient Name: Mary Holt  DOB: October 31, 1961 MRN: [id number]   Date of last office visit: 12/09/23 Date of next office visit: N/A   Request for Surgical Clearance    Procedure:   L3-4 LUMAR FUSION  Date of Surgery:  Clearance 04/08/24                                Surgeon:  [name], MD Surgeon's Group or Practice Name:  [id number] Phone number:  [phone] Fax number:  [phone]   Type of Clearance Requested:   - Medical  - Pharmacy:  Hold Aspirin NOT INDICATED   Type of Anesthesia:  General    Additional requests/questions:    Wilhemina Cash   03/23/2024, 4:29 PM

## 2024-03-24 ENCOUNTER — Telehealth: Payer: Self-pay | Admitting: *Deleted

## 2024-03-24 NOTE — Telephone Encounter (Signed)
  Patient Consent for Virtual Visit    Mary Holt has provided verbal consent on 03/24/2024 for a virtual visit (video or telephone).   CONSENT FOR VIRTUAL VISIT FOR:  Mary Holt  By participating in this virtual visit I agree to the following:  I hereby voluntarily request, consent and authorize [facility] and its employed or contracted physicians, physician assistants, nurse practitioners or other licensed health care professionals (the Practitioner), to provide me with telemedicine health care services (the "Services") as deemed necessary by the treating Practitioner. I acknowledge and consent to receive the Services by the Practitioner via telemedicine. I understand that the telemedicine visit will involve communicating with the Practitioner through live audiovisual communication technology and the disclosure of certain medical information by electronic transmission. I acknowledge that I have been given the opportunity to request an in-person assessment or other available alternative prior to the telemedicine visit and am voluntarily participating in the telemedicine visit.  I understand that I have the right to withhold or withdraw my consent to the use of telemedicine in the course of my care at any time, without affecting my right to future care or treatment, and that the Practitioner or I may terminate the telemedicine visit at any time. I understand that I have the right to inspect all information obtained and/or recorded in the course of the telemedicine visit and may receive copies of available information for a reasonable fee.  I understand that some of the potential risks of receiving the Services via telemedicine include:  Delay or interruption in medical evaluation due to technological equipment failure or disruption; Information transmitted may not be sufficient (e.g. poor resolution of images) to allow for appropriate medical decision making by the Practitioner;  and/or  In rare instances, security protocols could fail, causing a breach of personal health information.  Furthermore, I acknowledge that it is my responsibility to provide information about my medical history, conditions and care that is complete and accurate to the best of my ability. I acknowledge that Practitioner's advice, recommendations, and/or decision may be based on factors not within their control, such as incomplete or inaccurate data provided by me or distortions of diagnostic images or specimens that may result from electronic transmissions. I understand that the practice of medicine is not an exact science and that Practitioner makes no warranties or guarantees regarding treatment outcomes. I acknowledge that a copy of this consent can be made available to me via my patient portal [facility] MyChart), or I can request a printed copy by calling the office of [facility].    I understand that my insurance will be billed for this visit.   I have read or had this consent read to me. I understand the contents of this consent, which adequately explains the benefits and risks of the Services being provided via telemedicine.  I have been provided ample opportunity to ask questions regarding this consent and the Services and have had my questions answered to my satisfaction. I give my informed consent for the services to be provided through the use of telemedicine in my medical care

## 2024-03-24 NOTE — Telephone Encounter (Signed)
   Name: Mary Holt  DOB: 27-Nov-1961  MRN: [id number]  Primary Cardiologist: Thomasene Ripple, DO  Chart reviewed as part of pre-operative protocol coverage. Because of Mary Holt's past medical history and time since last visit, she will require a follow-up telephone visit in order to better assess preoperative cardiovascular risk.  Pre-op covering staff: - Please schedule appointment and call patient to inform them. If patient already had an upcoming appointment within acceptable timeframe, please add "pre-op clearance" to the appointment notes so provider is aware. - Please contact requesting surgeon's office via preferred method (i.e, phone, fax) to inform them of need for appointment prior to surgery.  The patient can hold aspirin x 5 to 7 days prior to the procedure and resume a medically safe to do so as long as asymptomatic at the time of telephone visit.  Sharlene Dory, PA-C  03/24/2024, 7:38 AM

## 2024-03-24 NOTE — Telephone Encounter (Signed)
 Patient scheduled for 03/25/24

## 2024-03-25 ENCOUNTER — Ambulatory Visit: Payer: PRIVATE HEALTH INSURANCE | Attending: Cardiovascular Disease | Admitting: Student

## 2024-03-25 DIAGNOSIS — Z01810 Encounter for preprocedural cardiovascular examination: Secondary | ICD-10-CM | POA: Diagnosis not present

## 2024-03-25 NOTE — Progress Notes (Signed)
 Virtual Visit via Telephone Note   Because of [name]'s co-morbid illnesses, she is at least at moderate risk for complications without adequate follow up.  This format is felt to be most appropriate for this patient at this time.  The patient did not have access to video technology/had technical difficulties with video requiring transitioning to audio format only (telephone).  All issues noted in this document were discussed and addressed.  No physical exam could be performed with this format.  Please refer to the patient's chart for her consent to telehealth for [facility].  Evaluation Performed:  Preoperative cardiovascular risk assessment _____________   Date:  03/25/2024   Patient ID:  Mary Holt, DOB [birth year], MRN [id number] Patient Location:  Home Provider location:   Office  Primary Care Provider:  [name], FNP Primary Cardiologist:  Thomasene Ripple, DO  Chief Complaint / Patient Profile   63 y.o. y/o female with a h/o CAD s/p PCI with DES to D2 and DES to OM1 March 2021, mitral valve prolapse, hypertension, OSA, T2DM who is pending L3-4 lumbar fusion by Dr. Franky Macho and presents today for telephonic preoperative cardiovascular risk assessment.  History of Present Illness    Mary Holt is a 63 y.o. female who presents via audio/video conferencing for a telehealth visit today.  Pt was last seen in cardiology clinic on 12/09/2023 by Dr. Servando Salina.  At that time Mary Holt was stable from a cardiac standpoint.  The patient is now pending procedure as outlined above. Since her last visit, she is doing well. Patient denies shortness of breath, dyspnea on exertion, lower extremity edema, orthopnea or PND. No chest pain, pressure, or tightness. No palpitations.  Her activities have been limited since injuring her back in January 2025. 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 She is getting  outside to walk in her neighborhood daily weather permitting.   Past Medical History    Past Medical History:  Diagnosis Date   Angina pectoris (HCC) 02/22/2020   CAD in native artery 02/22/2020   Chest pain of uncertain etiology 01/06/2020   Diabetes mellitus without complication (HCC)    Heart murmur    asymptomatic   Hyperlipidemia    Hypertension    Kidney stones    Kidney stones    several times, last  one 2014   Mitral valve prolapse    MVP (mitral valve prolapse)    Pure hypercholesterolemia 05/02/2020   Snoring 01/06/2020   Past Surgical History:  Procedure Laterality Date   BACK SURGERY     x2   BREAST SURGERY     cyst right breast   CARDIAC CATHETERIZATION     CARPAL TUNNEL RELEASE Bilateral    CORONARY STENT INTERVENTION N/A 02/26/2020   Procedure: CORONARY STENT INTERVENTION;  Surgeon: Mary Lex, MD;  Location: [facility] INVASIVE CV LAB;  Service: Cardiovascular;  Laterality: N/A;   DILATATION & CURRETTAGE/HYSTEROSCOPY WITH RESECTOCOPE N/A 08/11/2013   Procedure: DILATATION & CURETTAGE/HYSTEROSCOPY WITH RESECTOCOPE;  Surgeon: Serita Kyle, MD;  Location: [facility] ORS;  Service: Gynecology;  Laterality: N/A;   LEFT HEART CATH AND CORONARY ANGIOGRAPHY N/A 02/26/2020   Procedure: LEFT HEART CATH AND CORONARY ANGIOGRAPHY;  Surgeon: Mary Lex, MD;  Location: [facility] INVASIVE CV LAB;  Service: Cardiovascular;  Laterality: N/A;   LEFT HEART CATH AND CORONARY ANGIOGRAPHY N/A 11/25/2023   Procedure: LEFT HEART CATH AND CORONARY  ANGIOGRAPHY;  Surgeon: [place], Peter M, MD;  Location: [facility] INVASIVE CV LAB;  Service: Cardiovascular;  Laterality: N/A;   TONSILLECTOMY     TRIGGER FINGER RELEASE     left thumb   TUBAL LIGATION      Allergies  Allergies  Allergen Reactions   Relafen [Nabumetone] Rash   Penicillins Rash    Home Medications    Prior to Admission medications   Medication Sig Start Date End Date Taking? Authorizing Provider  aspirin EC 81 MG tablet Take 1 tablet  (81 mg total) by mouth daily. 02/16/20   Chilton Si, MD  blood glucose meter kit and supplies KIT Dispense based on patient and insurance preference. Use up to four times daily as directed. 02/15/23   [name], FNP  Blood Glucose Monitoring Suppl DEVI 1 each by Does not apply route in the morning, at noon, and at bedtime. May substitute to any manufacturer covered by patient's insurance. 02/15/23   [name], FNP  fenofibrate (TRICOR) 145 MG tablet TAKE 1 TABLET(145 MG) BY MOUTH DAILY 10/14/23   Chilton Si, MD  JARDIANCE 25 MG TABS tablet TAKE 1 TABLET(25 MG) BY MOUTH DAILY BEFORE BREAKFAST 08/21/23   [name], FNP  nitroGLYCERIN (NITROSTAT) 0.4 MG SL tablet Place 1 tablet (0.4 mg total) under the tongue every 5 (five) minutes as needed for chest pain. 09/03/23 03/24/24  Carlos Levering, NP  olmesartan (BENICAR) 40 MG tablet Take 1 tablet (40 mg total) by mouth daily. 03/05/24   [name], FNP  oxyCODONE-acetaminophen (PERCOCET/ROXICET) 5-325 MG tablet Take 1 tablet by mouth every 8 (eight) hours as needed for up to 9 doses for severe pain (pain score 7-10). 01/19/24   Mikeal Hawthorne, MD  rosuvastatin (CRESTOR) 40 MG tablet TAKE 1 TABLET BY MOUTH  DAILY AT 6 PM 03/05/24   [name], FNP  Semaglutide,0.25 or 0.5MG /DOS, (OZEMPIC, 0.25 OR 0.5 MG/DOSE,) 2 MG/1.5ML SOPN Inject 0.5 mg into the skin once a week. 02/11/24   [name], FNP  tiZANidine (ZANAFLEX) 4 MG tablet Take 0.5 tablets (2 mg total) by mouth every 8 (eight) hours as needed for up to 10 doses for muscle spasms. Patient not taking: Reported on 03/24/2024 01/19/24   Mikeal Hawthorne, MD    Physical Exam    Vital Signs:  Mary Holt does not have vital signs available for review today.  Given telephonic nature of communication, physical exam is limited. AAOx3. NAD. Normal affect.  Speech and respirations are unlabored.  Accessory Clinical Findings    Cardiac Studies & Procedures    ______________________________________________________________________________________________ CARDIAC CATHETERIZATION  CARDIAC CATHETERIZATION 11/25/2023  Narrative   Mid LAD lesion is 35% stenosed.   Dist LAD lesion is 40% stenosed.   Prox RCA to Mid RCA lesion is 45% stenosed.   Dist RCA lesion is 60% stenosed.   Non-stenotic Prox Cx to Mid Cx lesion was previously treated.   Non-stenotic 2nd Diag lesion was previously treated.   LV end diastolic pressure is normal.  Moderate nonobstructive disease in the distal RCA- unchanged from 2021. Patent stents in the first diagonal and first OM. Normal LVEDP  Plan: recommend medical management  Findings Coronary Findings Diagnostic  Dominance: Right  Left Main Vessel is normal in caliber.  Left Anterior Descending Vessel is small. Mid LAD lesion is 35% stenosed. The lesion is concentric. Dist LAD lesion is 40% stenosed. The lesion is eccentric and smooth.  First Diagonal Branch Vessel is small in size.  First Septal Branch Vessel  is small in size.  Second Diagonal Branch Vessel is small in size. Non-stenotic 2nd Diag lesion was previously treated. Vessel is the culprit lesion. The lesion is eccentric.  Third Diagonal Branch Vessel is small in size.  Left Circumflex Vessel is small. Non-stenotic Prox Cx to Mid Cx lesion was previously treated. The lesion is distal to major branch, discrete and eccentric.  First Obtuse Marginal Branch Vessel is small in size.  First Left Posterolateral Branch Vessel is small in size.  Left Posterior Atrioventricular Artery Vessel is small in size.  Right Coronary Artery Vessel is normal in caliber. Prox RCA to Mid RCA lesion is 45% stenosed. The lesion is segmental and irregular. Dist RCA lesion is 60% stenosed. The lesion is segmental and irregular.  Acute Marginal Branch Vessel is small in size.  Right Ventricular Branch Vessel is small in size.  First Right  Posterolateral Branch Vessel is small in size.  Intervention  No interventions have been documented.  STRESS TESTS  NM PET CT CARDIAC PERFUSION MULTI W/ABSOLUTE BLOODFLOW 10/29/2023  Narrative   Small reversible apical inferior perfusion defect, suggesting small area of ischemia.  Myocardial blood flow reserve is normal, though can be unreliable in setting of prior revascularization.  No high risk findings such as TID or drop in EF with stress.  Overall, study is low risk, as area of ischemia is small   LV perfusion is abnormal. There is evidence of ischemia. There is no evidence of infarction. Defect 1: There is a small defect with mild reduction in uptake present in the apical inferior location(s) that is reversible. There is normal wall motion in the defect area. Consistent with ischemia.   Rest left ventricular function is normal. Rest EF: 65%. Stress left ventricular function is normal. Stress EF: 68%. End diastolic cavity size is normal. End systolic cavity size is normal.   Myocardial blood flow was computed to be 1.60ml/g/min at rest and 3.25ml/g/min at stress. Global myocardial blood flow reserve was 2.29 and was normal.   Coronary calcium assessment not performed due to prior revascularization.   Findings are consistent with ischemia. The study is low risk.   Electronically signed by Epifanio Lesches, MD  CLINICAL DATA:  This over-read does not include interpretation of PET data, cardiac/coronary anatomy or pathology. The Cardiac PET CT interpretation by the cardiologist is attached.  COMPARISON:  02/05/2020 cardiac CT.  FINDINGS: No pleural fluid.  Left base scarring laterally.  Aortic atherosclerosis. No imaged thoracic adenopathy. Small hiatal hernia. Fluid level in the distal esophagus.  Normal imaged portions of the liver, spleen.  No acute osseous abnormality.  IMPRESSION: 1. No acute process in the imaged extracardiac chest. 2. Small hiatal hernia. Esophageal  air fluid level suggests dysmotility or gastroesophageal reflux. 3.  Aortic Atherosclerosis (ICD10-I70.0).   Electronically Signed By: Jeronimo Greaves M.D. On: 10/29/2023 14:01   ______________________________________________________________________________________________       Assessment & Plan    Primary Cardiologist: Thomasene Ripple, DO  Preoperative cardiovascular risk assessment.  L3-4 lumbar fusion by Dr. Franky Macho on 04/08/2024.  Chart reviewed as part of pre-operative protocol coverage. According to the RCRI, patient has a 0.9% risk of MACE. Patient reports activity equivalent to 4.0 METS (walking daily, light household activiites).   Given past medical history and time since last visit, based on ACC/AHA guidelines, Mary Holt would be at acceptable risk for the planned procedure without further cardiovascular testing.   Patient was advised that if she develops new symptoms prior to  surgery to contact our office to arrange a follow-up appointment.  she verbalized understanding.  Per office protocol, he may hold aspirin for 7 days prior to procedure and should resume as soon as hemodynamically stable postoperatively.   I will route this recommendation to the requesting party via Epic fax function.  Please call with questions.  Time:   Today, I have spent 6 minutes with the patient with telehealth technology discussing medical history, symptoms, and management plan.     Carlos Levering, NP  03/25/2024, 8:01 AM

## 2024-03-25 NOTE — Progress Notes (Signed)
 Surgical Instructions   Your procedure is scheduled on Wednesday, April 16th, 2025. Report to [facility] Main Entrance "A" at 6:30 A.M., then check in with the Admitting office. Any questions or running late day of surgery: call [phone]  Questions prior to your surgery date: call [phone], Monday-Friday, 8am-4pm. If you experience any cold or flu symptoms such as cough, fever, chills, shortness of breath, etc. between now and your scheduled surgery, please notify us at the above number.     Remember:  Do not eat or drink after midnight the night before your surgery    Take these medicines the morning of surgery with A SIP OF WATER: Fenofibrate (Tricor)   May take these medicines IF NEEDED: Nitroglycerin - if you need to take this medication, please call us at [phone] Oxycodone-acetaminophen [place])   Per your cardiologist, Aspirin should be held for 7 days prior to surgery.  Your last dose should be on Tuesday, April 8th.   One week prior to surgery, STOP taking any Aspirin (unless otherwise instructed by your surgeon) Aleve, Naproxen, Ibuprofen, Motrin, Advil, Goody's, BC's, all herbal medications, fish oil, and non-prescription vitamins.   WHAT DO I DO ABOUT MY DIABETES MEDICATION?   Semaglutide (Ozempic) should be held for 7 days prior to your surgery.  Your last dose should be on or before Tuesday, April 8th.    Jardiance should be held for 3 days prior to your surgery.   Your last dose should be on Saturday, April 12th.         HOW TO MANAGE YOUR DIABETES BEFORE AND AFTER SURGERY  Why is it important to control my blood sugar before and after surgery? Improving blood sugar levels before and after surgery helps healing and can limit problems. A way of improving blood sugar control is eating a healthy diet by:  Eating less sugar and carbohydrates  Increasing activity/exercise  Talking with your doctor about reaching your blood sugar goals High  blood sugars (greater than 180 mg/dL) can raise your risk of infections and slow your recovery, so you will need to focus on controlling your diabetes during the weeks before surgery. Make sure that the doctor who takes care of your diabetes knows about your planned surgery including the date and location.  How do I manage my blood sugar before surgery? Check your blood sugar at least 4 times a day, starting 2 days before surgery, to make sure that the level is not too high or low.  Check your blood sugar the morning of your surgery when you wake up and every 2 hours until you get to the Short Stay unit.  If your blood sugar is less than 70 mg/dL, you will need to treat for low blood sugar: Do not take insulin. Treat a low blood sugar (less than 70 mg/dL) with  cup of clear juice (cranberry or apple), 4 glucose tablets, OR glucose gel. Recheck blood sugar in 15 minutes after treatment (to make sure it is greater than 70 mg/dL). If your blood sugar is not greater than 70 mg/dL on recheck, call [phone] for further instructions. Report your blood sugar to the short stay nurse when you get to Short Stay.  If you are admitted to the hospital after surgery: Your blood sugar will be checked by the staff and you will probably be given insulin after surgery (instead of oral diabetes medicines) to make sure you have good blood sugar levels. The goal for blood sugar control after surgery is  80-180 mg/dL.                      Do NOT Smoke (Tobacco/Vaping) for 24 hours prior to your procedure.  If you use a CPAP at night, you may bring your mask/headgear for your overnight stay.   You will be asked to remove any contacts, glasses, piercing's, hearing aid's, dentures/partials prior to surgery. Please bring cases for these items if needed.    Patients discharged the day of surgery will not be allowed to drive home, and someone needs to stay with them for 24 hours.  SURGICAL WAITING ROOM  VISITATION Patients may have no more than 2 support people in the waiting area - these visitors may rotate.   Pre-op nurse will coordinate an appropriate time for 1 ADULT support person, who may not rotate, to accompany patient in pre-op.  Children under the age of 4 must have an adult with them who is not the patient and must remain in the main waiting area with an adult.  If the patient needs to stay at the hospital during part of their recovery, the visitor guidelines for inpatient rooms apply.  Please refer to the [facility] website for the visitor guidelines for any additional information.   If you received a COVID test during your pre-op visit  it is requested that you wear a mask when out in public, stay away from anyone that may not be feeling well and notify your surgeon if you develop symptoms. If you have been in contact with anyone that has tested positive in the last 10 days please notify you surgeon.      Pre-operative 5 CHG Bathing Instructions   You can play a key role in reducing the risk of infection after surgery. Your skin needs to be as free of germs as possible. You can reduce the number of germs on your skin by washing with CHG (chlorhexidine gluconate) soap before surgery. CHG is an antiseptic soap that kills germs and continues to kill germs even after washing.   DO NOT use if you have an allergy to chlorhexidine/CHG or antibacterial soaps. If your skin becomes reddened or irritated, stop using the CHG and notify one of our RNs at [phone].   Please shower with the CHG soap starting 4 days before surgery using the following schedule:     Please keep in mind the following:  DO NOT shave, including legs and underarms, starting the day of your first shower.   You may shave your face at any point before/day of surgery.  Place clean sheets on your bed the day you start using CHG soap. Use a clean washcloth (not used since being washed) for each shower. DO NOT  sleep with pets once you start using the CHG.   CHG Shower Instructions:  Wash your face and private area with normal soap. If you choose to wash your hair, wash first with your normal shampoo.  After you use shampoo/soap, rinse your hair and body thoroughly to remove shampoo/soap residue.  Turn the water OFF and apply about 3 tablespoons (45 ml) of CHG soap to a CLEAN washcloth.  Apply CHG soap ONLY FROM YOUR NECK DOWN TO YOUR TOES (washing for 3-5 minutes)  DO NOT use CHG soap on face, private areas, open wounds, or sores.  Pay special attention to the area where your surgery is being performed.  If you are having back surgery, having someone wash your back for you may  be helpful. Wait 2 minutes after CHG soap is applied, then you may rinse off the CHG soap.  Pat dry with a clean towel  Put on clean clothes/pajamas   If you choose to wear lotion, please use ONLY the CHG-compatible lotions that are listed below.  Additional instructions for the day of surgery: DO NOT APPLY any lotions, deodorants, cologne, or perfumes.   Do not bring valuables to the hospital. [facility] is not responsible for any belongings/valuables. Do not wear nail polish, gel polish, artificial nails, or any other type of covering on natural nails (fingers and toes) Do not wear jewelry or makeup Put on clean/comfortable clothes.  Please brush your teeth.  Ask your nurse before applying any prescription medications to the skin.     CHG Compatible Lotions   Aveeno Moisturizing lotion  Cetaphil Moisturizing Cream  Cetaphil Moisturizing Lotion  Clairol Herbal Essence Moisturizing Lotion, Dry Skin  Clairol Herbal Essence Moisturizing Lotion, Extra Dry Skin  Clairol Herbal Essence Moisturizing Lotion, Normal Skin  Curel Age Defying Therapeutic Moisturizing Lotion with Alpha Hydroxy  Curel Extreme Care Body Lotion  Curel Soothing Hands Moisturizing Hand Lotion  Curel Therapeutic Moisturizing Cream,  Fragrance-Free  Curel Therapeutic Moisturizing Lotion, Fragrance-Free  Curel Therapeutic Moisturizing Lotion, Original Formula  Eucerin Daily Replenishing Lotion  Eucerin Dry Skin Therapy Plus Alpha Hydroxy Crme  Eucerin Dry Skin Therapy Plus Alpha Hydroxy Lotion  Eucerin Original Crme  Eucerin Original Lotion  Eucerin Plus Crme Eucerin Plus Lotion  Eucerin TriLipid Replenishing Lotion  Keri Anti-Bacterial Hand Lotion  Keri Deep Conditioning Original Lotion Dry Skin Formula Softly Scented  Keri Deep Conditioning Original Lotion, Fragrance Free Sensitive Skin Formula  Keri Lotion Fast Absorbing Fragrance Free Sensitive Skin Formula  Keri Lotion Fast Absorbing Softly Scented Dry Skin Formula  Keri Original Lotion  Keri Skin Renewal Lotion Keri Silky Smooth Lotion  Keri Silky Smooth Sensitive Skin Lotion  Nivea Body Creamy Conditioning Oil  Nivea Body Extra Enriched Lotion  Nivea Body Original Lotion  Nivea Body Sheer Moisturizing Lotion Nivea Crme  Nivea Skin Firming Lotion  NutraDerm 30 Skin Lotion  NutraDerm Skin Lotion  NutraDerm Therapeutic Skin Cream  NutraDerm Therapeutic Skin Lotion  ProShield Protective Hand Cream  Provon moisturizing lotion  Please read over the following fact sheets that you were given.

## 2024-03-26 ENCOUNTER — Encounter (HOSPITAL_COMMUNITY): Payer: Self-pay

## 2024-03-26 ENCOUNTER — Other Ambulatory Visit: Payer: Self-pay

## 2024-03-26 ENCOUNTER — Encounter (HOSPITAL_COMMUNITY)
Admission: RE | Admit: 2024-03-26 | Discharge: 2024-03-26 | Disposition: A | Discharge status: Home / Self Care | Payer: PRIVATE HEALTH INSURANCE | Source: Ambulatory Visit | Attending: Neurosurgery | Admitting: Neurosurgery

## 2024-03-26 VITALS — BP 133/83 | HR 90 | Temp 98.8°F | Resp 16 | Ht 63.0 in | Wt 164.4 lb

## 2024-03-26 DIAGNOSIS — Z01812 Encounter for preprocedural laboratory examination: Secondary | ICD-10-CM | POA: Insufficient documentation

## 2024-03-26 DIAGNOSIS — Z01818 Encounter for other preprocedural examination: Secondary | ICD-10-CM

## 2024-03-26 LAB — CBC
HCT: 42.2 % (ref 36.0–46.0)
Hemoglobin: 13.3 g/dL (ref 12.0–15.0)
MCH: 27.7 pg (ref 26.0–34.0)
MCHC: 31.5 g/dL (ref 30.0–36.0)
MCV: 87.7 fL (ref 80.0–100.0)
Platelets: 363 10*3/uL (ref 150–400)
RBC: 4.81 MIL/uL (ref 3.87–5.11)
RDW: 13.3 % (ref 11.5–15.5)
WBC: 7.7 10*3/uL (ref 4.0–10.5)
nRBC: 0 % (ref 0.0–0.2)

## 2024-03-26 LAB — BASIC METABOLIC PANEL WITH GFR
Anion gap: 10 (ref 5–15)
BUN: 16 mg/dL (ref 8–23)
CO2: 24 mmol/L (ref 22–32)
Calcium: 9.8 mg/dL (ref 8.9–10.3)
Chloride: 107 mmol/L (ref 98–111)
Creatinine, Ser: 0.81 mg/dL (ref 0.44–1.00)
GFR, Estimated: >60 mL/min (ref >60)
Glucose, Bld: 114 mg/dL — ABNORMAL HIGH (ref 70–99)
Potassium: 4.1 mmol/L (ref 3.5–5.1)
Sodium: 141 mmol/L (ref 135–145)

## 2024-03-26 LAB — TYPE AND SCREEN
ABO/RH(D): A POS
Antibody Screen: NEGATIVE

## 2024-03-26 LAB — SURGICAL PCR SCREEN
MRSA, PCR: NEGATIVE
Staphylococcus aureus: POSITIVE — AB

## 2024-03-26 LAB — GLUCOSE, CAPILLARY: Glucose-Capillary: 116 mg/dL — ABNORMAL HIGH (ref 70–99)

## 2024-03-26 NOTE — Anesthesia Preprocedure Evaluation (Addendum)
 Anesthesia Evaluation  Patient identified by MRN, date of birth, ID band Patient awake    Reviewed: Allergy & Precautions, NPO status , Patient's Chart, lab work & pertinent test results  History of Anesthesia Complications Negative for: history of anesthetic complications  Airway Mallampati: I  TM Distance: >3 FB Neck ROM: Full    Dental  (+) Dental Advisory Given   Pulmonary sleep apnea (does not use CPAP)    breath sounds clear to auscultation       Cardiovascular hypertension, Pt. on medications + angina (maybe 1-2/month, markedly dimished since stents) with exertion + CAD ('24 cath: moderate non-obstructive, 45-60% RCA) and + Cardiac Stents   Rhythm:Regular Rate:Normal  '21 ECHO: EF 60-65%, normal LVF, no significant valvular abnormalities   Neuro/Psych negative neurological ROS     GI/Hepatic Neg liver ROS,GERD  Controlled,,  Endo/Other  diabetes (glu 140)  Semaglutide: last >1 week ago Jardiance  Renal/GU Renal InsufficiencyRenal disease     Musculoskeletal  (+) Arthritis ,    Abdominal   Peds  Hematology Hb 13.3, plt 363k   Anesthesia Other Findings   Reproductive/Obstetrics                             Anesthesia Physical Anesthesia Plan  ASA: 3  Anesthesia Plan: General   Post-op Pain Management: Tylenol PO (pre-op)*   Induction: Intravenous  PONV Risk Score and Plan: 3 and Ondansetron, Dexamethasone and Treatment may vary due to age or medical condition  Airway Management Planned: Oral ETT  Additional Equipment: None  Intra-op Plan:   Post-operative Plan: Extubation in OR  Informed Consent: I have reviewed the patients History and Physical, chart, labs and discussed the procedure including the risks, benefits and alternatives for the proposed anesthesia with the patient or authorized representative who has indicated his/her understanding and acceptance.      Dental advisory given  Plan Discussed with: CRNA and Surgeon  Anesthesia Plan Comments: (  PAT note by Rudy Costain, PA-C: 63 year old female follows with cardiology for history of  CAD s/p PCI with DES to D2 and DES to OM1 March 2021, mitral valve prolapse, hypertension, OSA not on CPAP.  Cath 11/2023 showed moderate nonobstructive disease in the distal RCA, unchanged from 2021, previously placed stents were patent.  Medical management recommended.  Seen by Morey Ar, NP on 03/25/2024 for preop evaluation.  Per note, "Chart reviewed as part of pre-operative protocol coverage. According to the RCRI, patient has a 0.9% risk of MACE. Patient reports activity equivalent to 4.0 METS (walking daily, light household activiites). Given past medical history and time since last visit, based on ACC/AHA guidelines, KAMA CAMMARANO would be at acceptable risk for the planned procedure without further cardiovascular testing. Patient was advised that if she develops new symptoms prior to surgery to contact our office to arrange a follow-up appointment.  she verbalized understanding. Per office protocol, he may hold aspirin for 7 days prior to procedure and should resume as soon as hemodynamically stable postoperatively. "  Non-insulin-dependent DM2, A1c 7.7 on 02/11/2024.  Preop labs reviewed, WNL.  EKG 11/25/23: NSR.  Rate 86.  Cath 11/25/23:   Mid LAD lesion is 35% stenosed.   Dist LAD lesion is 40% stenosed.   Prox RCA to Mid RCA lesion is 45% stenosed.   Dist RCA lesion is 60% stenosed.   Non-stenotic Prox Cx to Mid Cx lesion was previously treated.   Non-stenotic 2nd Diag  lesion was previously treated.   LV end diastolic pressure is normal.  1. Moderate nonobstructive disease in the distal RCA- unchanged from 2021. Patent stents in the first diagonal and first OM.  2. Normal LVEDP  Plan: recommend medical management  )        Anesthesia Quick Evaluation

## 2024-03-26 NOTE — Progress Notes (Signed)
 PCP -  Cardiologist - Lavona Mound Tobb,DO  PPM/ICD - denies Device Orders -  Rep Notified -   Chest x-ray - 07/30/23 EKG - 11/25/23 Stress Test - 10/29/23 ECHO - 01/20/20 Cardiac Cath - 11/25/23  Sleep Study - denies CPAP -   Fasting Blood Sugar - pt says she doesn't check her blood sugar. She does have a meter and agrees to check her sugar the DOS Checks Blood Sugar _____ times a day  Last dose of GLP1 agonist-  03/31/24 GLP1 instructions: Semaglutide (Ozempic) should be held for 7 days prior to your surgery.  Your last dose should be on or before Tuesday, April 8th.      Blood Thinner Instructions:na Aspirin Instructions:Per your cardiologist, Aspirin should be held for 7 days prior to surgery. Your last dose should be on Tuesday, April 8th.   ERAS Protcol -no PRE-SURGERY Ensure or G2-   COVID TEST- na   Anesthesia review: yes- cardiac clearance  Patient denies shortness of breath, fever, cough and chest pain at PAT appointment   All instructions explained to the patient, with a verbal understanding of the material. Patient agrees to go over the instructions while at home for a better understanding. Patient also instructed to wear a mask when out in public prior to surgery. The opportunity to ask questions was provided.

## 2024-03-26 NOTE — Progress Notes (Signed)
 Anesthesia Chart Review:  63 year old female follows with cardiology for history of  CAD s/p PCI with DES to D2 and DES to OM1 March 2021, mitral valve prolapse, hypertension, OSA not on CPAP.  Cath 11/2023 showed moderate nonobstructive disease in the distal RCA, unchanged from 2021, previously placed stents were patent.  Medical management recommended.  Seen by Carlos Levering, NP on 03/25/2024 for preop evaluation.  Per note, "Chart reviewed as part of pre-operative protocol coverage. According to the RCRI, patient has a 0.9% risk of MACE. Patient reports activity equivalent to 4.0 METS (walking daily, light household activiites). Given past medical history and time since last visit, based on ACC/AHA guidelines, Mary Holt would be at acceptable risk for the planned procedure without further cardiovascular testing. Patient was advised that if she develops new symptoms prior to surgery to contact our office to arrange a follow-up appointment.  she verbalized understanding. Per office protocol, he may hold aspirin for 7 days prior to procedure and should resume as soon as hemodynamically stable postoperatively. "  Non-insulin-dependent DM2, A1c 7.7 on 02/11/2024.  Preop labs reviewed, WNL.  EKG 11/25/23: NSR.  Rate 86.  Cath 11/25/23:   Mid LAD lesion is 35% stenosed.   Dist LAD lesion is 40% stenosed.   Prox RCA to Mid RCA lesion is 45% stenosed.   Dist RCA lesion is 60% stenosed.   Non-stenotic Prox Cx to Mid Cx lesion was previously treated.   Non-stenotic 2nd Diag lesion was previously treated.   LV end diastolic pressure is normal.   Moderate nonobstructive disease in the distal RCA- unchanged from 2021. Patent stents in the first diagonal and first OM.  Normal LVEDP   Plan: recommend medical management    Mary Holt [facility] Short Stay Center/Anesthesiology Phone [phone] 03/26/2024 1:18 PM

## 2024-04-08 ENCOUNTER — Other Ambulatory Visit: Payer: Self-pay

## 2024-04-08 ENCOUNTER — Encounter (HOSPITAL_COMMUNITY)
Admission: RE | Disposition: A | Discharge status: Home / Self Care | Payer: Self-pay | Source: Home / Self Care | Attending: Neurosurgery

## 2024-04-08 ENCOUNTER — Ambulatory Visit (HOSPITAL_BASED_OUTPATIENT_CLINIC_OR_DEPARTMENT_OTHER): Payer: PRIVATE HEALTH INSURANCE | Admitting: Anesthesiology

## 2024-04-08 ENCOUNTER — Ambulatory Visit (HOSPITAL_COMMUNITY): Payer: PRIVATE HEALTH INSURANCE | Admitting: Physician Assistant

## 2024-04-08 ENCOUNTER — Ambulatory Visit (HOSPITAL_COMMUNITY): Payer: PRIVATE HEALTH INSURANCE

## 2024-04-08 ENCOUNTER — Observation Stay (HOSPITAL_COMMUNITY)
Admission: RE | Admit: 2024-04-08 | Discharge: 2024-04-12 | Disposition: A | Discharge status: Home / Self Care | Payer: PRIVATE HEALTH INSURANCE | Attending: Neurosurgery | Admitting: Neurosurgery

## 2024-04-08 ENCOUNTER — Encounter (HOSPITAL_COMMUNITY): Payer: Self-pay | Admitting: Neurosurgery

## 2024-04-08 DIAGNOSIS — M5416 Radiculopathy, lumbar region: Secondary | ICD-10-CM | POA: Insufficient documentation

## 2024-04-08 DIAGNOSIS — M48062 Spinal stenosis, lumbar region with neurogenic claudication: Secondary | ICD-10-CM

## 2024-04-08 DIAGNOSIS — Z79899 Other long term (current) drug therapy: Secondary | ICD-10-CM | POA: Insufficient documentation

## 2024-04-08 DIAGNOSIS — Z01818 Encounter for other preprocedural examination: Secondary | ICD-10-CM

## 2024-04-08 DIAGNOSIS — M4316 Spondylolisthesis, lumbar region: Principal | ICD-10-CM | POA: Insufficient documentation

## 2024-04-08 DIAGNOSIS — Z23 Encounter for immunization: Secondary | ICD-10-CM | POA: Diagnosis not present

## 2024-04-08 DIAGNOSIS — Z7901 Long term (current) use of anticoagulants: Secondary | ICD-10-CM | POA: Diagnosis not present

## 2024-04-08 DIAGNOSIS — Z9889 Other specified postprocedural states: Secondary | ICD-10-CM

## 2024-04-08 HISTORY — PX: LUMBAR LAMINECTOMY/DECOMPRESSION MICRODISCECTOMY: SHX5026

## 2024-04-08 LAB — GLUCOSE, CAPILLARY
Glucose-Capillary: 140 mg/dL — ABNORMAL HIGH (ref 70–99)
Glucose-Capillary: 134 mg/dL — ABNORMAL HIGH (ref 70–99)
Glucose-Capillary: 135 mg/dL — ABNORMAL HIGH (ref 70–99)

## 2024-04-08 LAB — CBC
HCT: 39.8 % (ref 36.0–46.0)
Hemoglobin: 12.7 g/dL (ref 12.0–15.0)
MCH: 27.6 pg (ref 26.0–34.0)
MCHC: 31.9 g/dL (ref 30.0–36.0)
MCV: 86.5 fL (ref 80.0–100.0)
Platelets: 372 10*3/uL (ref 150–400)
RBC: 4.6 MIL/uL (ref 3.87–5.11)
RDW: 13.4 % (ref 11.5–15.5)
WBC: 8.2 10*3/uL (ref 4.0–10.5)
nRBC: 0 % (ref 0.0–0.2)

## 2024-04-08 LAB — CREATININE, SERUM
Creatinine, Ser: 0.78 mg/dL (ref 0.44–1.00)
GFR, Estimated: >60 mL/min (ref >60)

## 2024-04-08 SURGERY — LUMBAR LAMINECTOMY/DECOMPRESSION MICRODISCECTOMY 1 LEVEL
Anesthesia: General

## 2024-04-08 MED ORDER — CHLORHEXIDINE GLUCONATE CLOTH 2 % EX PADS: 6.0000 | MEDICATED_PAD | Freq: Once | CUTANEOUS | Status: DC

## 2024-04-08 MED ORDER — MIDAZOLAM HCL 2 MG/2ML IJ SOLN
INTRAMUSCULAR | Status: AC
  Filled 2024-04-08: qty 2

## 2024-04-08 MED ORDER — THROMBIN 20000 UNITS EX SOLR
CUTANEOUS | Status: AC
  Filled 2024-04-08: qty 20000

## 2024-04-08 MED ORDER — ACETAMINOPHEN 650 MG RE SUPP: 650.0000 mg | RECTAL | Status: DC | PRN

## 2024-04-08 MED ORDER — PROPOFOL 10 MG/ML IV BOLUS
INTRAVENOUS | Status: AC
  Filled 2024-04-08: qty 20

## 2024-04-08 MED ORDER — HYDROMORPHONE HCL 1 MG/ML IJ SOLN
INTRAMUSCULAR | Status: AC
  Filled 2024-04-08: qty 1

## 2024-04-08 MED ORDER — OXYCODONE HCL 5 MG PO TABS
10.0000 mg | ORAL_TABLET | ORAL | Status: DC | PRN
  Administered 2024-04-08 – 2024-04-10 (×4): 10 mg via ORAL
  Filled 2024-04-08 (×4): qty 2

## 2024-04-08 MED ORDER — SODIUM CHLORIDE 0.9 % IV SOLN: 250.0000 mL | INTRAVENOUS | Status: AC

## 2024-04-08 MED ORDER — IRBESARTAN 300 MG PO TABS
300.0000 mg | ORAL_TABLET | Freq: Every day | ORAL | Status: DC
  Administered 2024-04-08 – 2024-04-12 (×5): 300 mg via ORAL
  Filled 2024-04-08 (×5): qty 1

## 2024-04-08 MED ORDER — EMPAGLIFLOZIN 25 MG PO TABS
25.0000 mg | ORAL_TABLET | Freq: Every day | ORAL | Status: DC
  Administered 2024-04-09 – 2024-04-12 (×4): 25 mg via ORAL
  Filled 2024-04-08 (×4): qty 1

## 2024-04-08 MED ORDER — HEPARIN SODIUM (PORCINE) 5000 UNIT/ML IJ SOLN
5000.0000 [IU] | Freq: Three times a day (TID) | INTRAMUSCULAR | Status: DC
  Administered 2024-04-08 – 2024-04-12 (×12): 5000 [IU] via SUBCUTANEOUS
  Filled 2024-04-08 (×12): qty 1

## 2024-04-08 MED ORDER — OXYCODONE HCL ER 10 MG PO T12A
10.0000 mg | EXTENDED_RELEASE_TABLET | Freq: Two times a day (BID) | ORAL | Status: DC
  Administered 2024-04-08 – 2024-04-12 (×9): 10 mg via ORAL
  Filled 2024-04-08 (×10): qty 1

## 2024-04-08 MED ORDER — LIDOCAINE-EPINEPHRINE 0.5 %-1:200000 IJ SOLN
INTRAMUSCULAR | Status: AC
  Filled 2024-04-08: qty 50

## 2024-04-08 MED ORDER — MEPERIDINE HCL 25 MG/ML IJ SOLN: 6.2500 mg | INTRAMUSCULAR | Status: DC | PRN

## 2024-04-08 MED ORDER — ROSUVASTATIN CALCIUM 20 MG PO TABS
40.0000 mg | ORAL_TABLET | Freq: Every day | ORAL | Status: DC
  Administered 2024-04-08 – 2024-04-12 (×5): 40 mg via ORAL
  Filled 2024-04-08 (×5): qty 2

## 2024-04-08 MED ORDER — ONDANSETRON HCL 4 MG/2ML IJ SOLN: 4.0000 mg | Freq: Four times a day (QID) | INTRAMUSCULAR | Status: DC | PRN

## 2024-04-08 MED ORDER — 0.9 % SODIUM CHLORIDE (POUR BTL) OPTIME
TOPICAL | Status: DC | PRN
  Administered 2024-04-08: 1000 mL

## 2024-04-08 MED ORDER — MENTHOL 3 MG MT LOZG
1.0000 | LOZENGE | OROMUCOSAL | Status: DC | PRN
  Administered 2024-04-09: 3 mg via ORAL
  Filled 2024-04-08: qty 9

## 2024-04-08 MED ORDER — FENTANYL CITRATE (PF) 250 MCG/5ML IJ SOLN
INTRAMUSCULAR | Status: DC | PRN
Start: 2024-04-08 — End: 2024-04-08
  Administered 2024-04-08: 100 ug via INTRAVENOUS
  Administered 2024-04-08 (×2): 50 ug via INTRAVENOUS

## 2024-04-08 MED ORDER — LIDOCAINE 2% (20 MG/ML) 5 ML SYRINGE
INTRAMUSCULAR | Status: DC | PRN
  Administered 2024-04-08: 40 mg via INTRAVENOUS

## 2024-04-08 MED ORDER — PHENYLEPHRINE 80 MCG/ML (10ML) SYRINGE FOR IV PUSH (FOR BLOOD PRESSURE SUPPORT)
PREFILLED_SYRINGE | INTRAVENOUS | Status: AC
  Filled 2024-04-08: qty 10

## 2024-04-08 MED ORDER — CEFAZOLIN SODIUM-DEXTROSE 2-4 GM/100ML-% IV SOLN
2.0000 g | INTRAVENOUS | Status: AC
Start: 2024-04-08 — End: 2024-04-08
  Administered 2024-04-08: 2 g via INTRAVENOUS
  Filled 2024-04-08: qty 100

## 2024-04-08 MED ORDER — INSULIN ASPART 100 UNIT/ML IJ SOLN
0.0000 [IU] | INTRAMUSCULAR | Status: DC | PRN
  Administered 2024-04-08: 2 [IU] via SUBCUTANEOUS

## 2024-04-08 MED ORDER — POTASSIUM CHLORIDE IN NACL 20-0.9 MEQ/L-% IV SOLN: INTRAVENOUS | Status: AC

## 2024-04-08 MED ORDER — HYDROMORPHONE HCL 1 MG/ML IJ SOLN
0.2500 mg | INTRAMUSCULAR | Status: DC | PRN
  Administered 2024-04-08 (×2): 0.25 mg via INTRAVENOUS

## 2024-04-08 MED ORDER — SODIUM CHLORIDE 0.9% FLUSH: 3.0000 mL | INTRAVENOUS | Status: DC | PRN

## 2024-04-08 MED ORDER — ESMOLOL HCL 100 MG/10ML IV SOLN
INTRAVENOUS | Status: AC
  Filled 2024-04-08: qty 10

## 2024-04-08 MED ORDER — DEXAMETHASONE SODIUM PHOSPHATE 10 MG/ML IJ SOLN
INTRAMUSCULAR | Status: DC | PRN
  Administered 2024-04-08: 10 mg via INTRAVENOUS

## 2024-04-08 MED ORDER — DEXAMETHASONE SODIUM PHOSPHATE 10 MG/ML IJ SOLN
INTRAMUSCULAR | Status: AC
  Filled 2024-04-08: qty 1

## 2024-04-08 MED ORDER — FENOFIBRATE 160 MG PO TABS
160.0000 mg | ORAL_TABLET | Freq: Every day | ORAL | Status: DC
  Administered 2024-04-09 – 2024-04-12 (×4): 160 mg via ORAL
  Filled 2024-04-08 (×4): qty 1

## 2024-04-08 MED ORDER — LACTATED RINGERS IV SOLN: INTRAVENOUS | Status: DC | PRN

## 2024-04-08 MED ORDER — PHENOL 1.4 % MT LIQD: 1.0000 | OROMUCOSAL | Status: DC | PRN

## 2024-04-08 MED ORDER — ASPIRIN 81 MG PO TBEC
81.0000 mg | DELAYED_RELEASE_TABLET | Freq: Every day | ORAL | Status: DC
  Administered 2024-04-08 – 2024-04-12 (×5): 81 mg via ORAL
  Filled 2024-04-08 (×5): qty 1

## 2024-04-08 MED ORDER — FENTANYL CITRATE (PF) 250 MCG/5ML IJ SOLN
INTRAMUSCULAR | Status: AC
  Filled 2024-04-08: qty 5

## 2024-04-08 MED ORDER — SUGAMMADEX SODIUM 200 MG/2ML IV SOLN
INTRAVENOUS | Status: DC | PRN
  Administered 2024-04-08: 200 mg via INTRAVENOUS

## 2024-04-08 MED ORDER — ACETAMINOPHEN 325 MG PO TABS
650.0000 mg | ORAL_TABLET | ORAL | Status: DC | PRN
  Administered 2024-04-08 – 2024-04-12 (×3): 650 mg via ORAL
  Filled 2024-04-08 (×3): qty 2

## 2024-04-08 MED ORDER — PROPOFOL 10 MG/ML IV BOLUS
INTRAVENOUS | Status: DC | PRN
  Administered 2024-04-08: 110 mg via INTRAVENOUS

## 2024-04-08 MED ORDER — LIDOCAINE 2% (20 MG/ML) 5 ML SYRINGE
INTRAMUSCULAR | Status: AC
  Filled 2024-04-08: qty 5

## 2024-04-08 MED ORDER — OXYCODONE HCL 5 MG PO TABS: 5.0000 mg | ORAL_TABLET | Freq: Once | ORAL | Status: DC | PRN

## 2024-04-08 MED ORDER — BUPIVACAINE HCL (PF) 0.5 % IJ SOLN
INTRAMUSCULAR | Status: DC | PRN
  Administered 2024-04-08: 30 mL

## 2024-04-08 MED ORDER — THROMBIN 20000 UNITS EX SOLR
CUTANEOUS | Status: DC | PRN
  Administered 2024-04-08: 20 mL via TOPICAL

## 2024-04-08 MED ORDER — MIDAZOLAM HCL 2 MG/2ML IJ SOLN
INTRAMUSCULAR | Status: DC | PRN
  Administered 2024-04-08: 2 mg via INTRAVENOUS

## 2024-04-08 MED ORDER — OXYCODONE HCL 5 MG PO TABS
5.0000 mg | ORAL_TABLET | ORAL | Status: DC | PRN
  Administered 2024-04-11: 5 mg via ORAL
  Filled 2024-04-08: qty 1

## 2024-04-08 MED ORDER — OXYCODONE HCL 5 MG/5ML PO SOLN: 5.0000 mg | Freq: Once | ORAL | Status: DC | PRN

## 2024-04-08 MED ORDER — PHENYLEPHRINE 80 MCG/ML (10ML) SYRINGE FOR IV PUSH (FOR BLOOD PRESSURE SUPPORT)
PREFILLED_SYRINGE | INTRAVENOUS | Status: DC | PRN
Start: 2024-04-08 — End: 2024-04-08
  Administered 2024-04-08 (×2): 160 ug via INTRAVENOUS
  Administered 2024-04-08 (×2): 80 ug via INTRAVENOUS
  Administered 2024-04-08: 160 ug via INTRAVENOUS

## 2024-04-08 MED ORDER — PHENYLEPHRINE HCL-NACL 20-0.9 MG/250ML-% IV SOLN
INTRAVENOUS | Status: DC | PRN
  Administered 2024-04-08: 40 ug/min via INTRAVENOUS

## 2024-04-08 MED ORDER — ACETAMINOPHEN 500 MG PO TABS
1000.0000 mg | ORAL_TABLET | Freq: Once | ORAL | Status: AC
  Administered 2024-04-08: 1000 mg via ORAL
  Filled 2024-04-08: qty 2

## 2024-04-08 MED ORDER — ROCURONIUM BROMIDE 10 MG/ML (PF) SYRINGE
PREFILLED_SYRINGE | INTRAVENOUS | Status: AC
  Filled 2024-04-08: qty 10

## 2024-04-08 MED ORDER — ESMOLOL HCL 100 MG/10ML IV SOLN
INTRAVENOUS | Status: DC | PRN
  Administered 2024-04-08: 20 mg via INTRAVENOUS

## 2024-04-08 MED ORDER — ROCURONIUM BROMIDE 10 MG/ML (PF) SYRINGE
PREFILLED_SYRINGE | INTRAVENOUS | Status: DC | PRN
  Administered 2024-04-08: 60 mg via INTRAVENOUS
  Administered 2024-04-08: 10 mg via INTRAVENOUS

## 2024-04-08 MED ORDER — SODIUM CHLORIDE 0.9% FLUSH
3.0000 mL | Freq: Two times a day (BID) | INTRAVENOUS | Status: DC
  Administered 2024-04-08 (×2): 3 mL via INTRAVENOUS

## 2024-04-08 MED ORDER — MIDAZOLAM HCL 2 MG/2ML IJ SOLN: 0.5000 mg | Freq: Once | INTRAMUSCULAR | Status: DC | PRN

## 2024-04-08 MED ORDER — LIDOCAINE-EPINEPHRINE 0.5 %-1:200000 IJ SOLN
INTRAMUSCULAR | Status: DC | PRN
  Administered 2024-04-08: 10 mL

## 2024-04-08 MED ORDER — DIAZEPAM 5 MG PO TABS
5.0000 mg | ORAL_TABLET | Freq: Four times a day (QID) | ORAL | Status: DC | PRN
  Administered 2024-04-09: 5 mg via ORAL
  Filled 2024-04-08: qty 1

## 2024-04-08 MED ORDER — INSULIN ASPART 100 UNIT/ML IJ SOLN
INTRAMUSCULAR | Status: AC
  Filled 2024-04-08: qty 1

## 2024-04-08 MED ORDER — NITROGLYCERIN 0.4 MG SL SUBL
0.4000 mg | SUBLINGUAL_TABLET | SUBLINGUAL | Status: DC | PRN
Start: 2024-04-08 — End: 2024-04-12

## 2024-04-08 MED ORDER — ONDANSETRON HCL 4 MG PO TABS
4.0000 mg | ORAL_TABLET | Freq: Four times a day (QID) | ORAL | Status: DC | PRN
  Administered 2024-04-09: 4 mg via ORAL
  Filled 2024-04-08: qty 1

## 2024-04-08 MED ORDER — ONDANSETRON HCL 4 MG/2ML IJ SOLN
INTRAMUSCULAR | Status: AC
  Filled 2024-04-08: qty 2

## 2024-04-08 MED ORDER — BUPIVACAINE HCL (PF) 0.5 % IJ SOLN
INTRAMUSCULAR | Status: AC
  Filled 2024-04-08: qty 30

## 2024-04-08 MED ORDER — CHLORHEXIDINE GLUCONATE 0.12 % MT SOLN
15.0000 mL | Freq: Once | OROMUCOSAL | Status: AC
  Administered 2024-04-08: 15 mL via OROMUCOSAL
  Filled 2024-04-08: qty 15

## 2024-04-08 MED ORDER — ONDANSETRON HCL 4 MG/2ML IJ SOLN
INTRAMUSCULAR | Status: DC | PRN
  Administered 2024-04-08: 4 mg via INTRAVENOUS

## 2024-04-08 MED ORDER — ORAL CARE MOUTH RINSE: 15.0000 mL | Freq: Once | OROMUCOSAL | Status: AC

## 2024-04-08 MED ORDER — CHLORHEXIDINE GLUCONATE CLOTH 2 % EX PADS
6.0000 | MEDICATED_PAD | Freq: Once | CUTANEOUS | Status: DC
Start: 2024-04-08 — End: 2024-04-08

## 2024-04-08 SURGICAL SUPPLY — 49 items
BAG COUNTER SPONGE SURGICOUNT (BAG) ×1 IMPLANT
BASKET BONE COLLECTION (BASKET) ×1 IMPLANT
BENZOIN TINCTURE PRP APPL 2/3 (GAUZE/BANDAGES/DRESSINGS) IMPLANT
BLADE BONE MILL MEDIUM (MISCELLANEOUS) ×1 IMPLANT
BLADE CLIPPER SURG (BLADE) IMPLANT
BUR MATCHSTICK NEURO 3.0 LAGG (BURR) ×1 IMPLANT
BUR PRECISION FLUTE 5.0 (BURR) ×1 IMPLANT
CANISTER SUCT 3000ML PPV (MISCELLANEOUS) ×1 IMPLANT
CNTNR URN SCR LID CUP LEK RST (MISCELLANEOUS) ×1 IMPLANT
COVER BACK TABLE 60X90IN (DRAPES) ×1 IMPLANT
DERMABOND ADVANCED .7 DNX12 (GAUZE/BANDAGES/DRESSINGS) ×1 IMPLANT
DERMABOND ADVANCED .7 DNX6 (GAUZE/BANDAGES/DRESSINGS) IMPLANT
DRAPE C-ARM 42X72 X-RAY (DRAPES) ×2 IMPLANT
DRAPE C-ARMOR (DRAPES) IMPLANT
DRAPE LAPAROTOMY 100X72X124 (DRAPES) ×1 IMPLANT
DRAPE SURG 17X23 STRL (DRAPES) ×1 IMPLANT
DURAPREP 26ML APPLICATOR (WOUND CARE) ×1 IMPLANT
ELECT REM PT RETURN 9FT ADLT (ELECTROSURGICAL) ×1 IMPLANT
ELECTRODE REM PT RTRN 9FT ADLT (ELECTROSURGICAL) ×1 IMPLANT
GAUZE 4X4 16PLY ~~LOC~~+RFID DBL (SPONGE) IMPLANT
GAUZE SPONGE 4X4 12PLY STRL (GAUZE/BANDAGES/DRESSINGS) IMPLANT
GLOVE EXAM NITRILE XL STR (GLOVE) IMPLANT
GLOVE SURG LTX SZ6.5 (GLOVE) ×2 IMPLANT
GOWN STRL REUS W/ TWL LRG LVL3 (GOWN DISPOSABLE) ×2 IMPLANT
GOWN STRL REUS W/ TWL XL LVL3 (GOWN DISPOSABLE) IMPLANT
GOWN STRL REUS W/TWL 2XL LVL3 (GOWN DISPOSABLE) IMPLANT
KIT BASIN OR (CUSTOM PROCEDURE TRAY) ×1 IMPLANT
KIT POSITION SURG JACKSON T1 (MISCELLANEOUS) ×1 IMPLANT
KIT TURNOVER KIT B (KITS) ×1 IMPLANT
MILL BONE PREP (MISCELLANEOUS) IMPLANT
NDL HYPO 25X1 1.5 SAFETY (NEEDLE) ×1 IMPLANT
NDL SPNL 18GX3.5 QUINCKE PK (NEEDLE) IMPLANT
NEEDLE HYPO 25X1 1.5 SAFETY (NEEDLE) ×1 IMPLANT
NEEDLE SPNL 18GX3.5 QUINCKE PK (NEEDLE) IMPLANT
NS IRRIG 1000ML POUR BTL (IV SOLUTION) ×1 IMPLANT
PACK LAMINECTOMY NEURO (CUSTOM PROCEDURE TRAY) ×1 IMPLANT
PAD ARMBOARD POSITIONER FOAM (MISCELLANEOUS) ×2 IMPLANT
SPIKE FLUID TRANSFER (MISCELLANEOUS) ×1 IMPLANT
SPONGE SURGIFOAM ABS GEL 100 (HEMOSTASIS) ×1 IMPLANT
SPONGE T-LAP 4X18 ~~LOC~~+RFID (SPONGE) IMPLANT
STRIP CLOSURE SKIN 1/2X4 (GAUZE/BANDAGES/DRESSINGS) IMPLANT
SUT PROLENE 6 0 BV (SUTURE) IMPLANT
SUT VIC AB 0 CT1 18XCR BRD8 (SUTURE) ×1 IMPLANT
SUT VIC AB 2-0 CT1 18 (SUTURE) ×1 IMPLANT
SUT VIC AB 3-0 SH 8-18 (SUTURE) ×1 IMPLANT
TOWEL GREEN STERILE (TOWEL DISPOSABLE) ×1 IMPLANT
TOWEL GREEN STERILE FF (TOWEL DISPOSABLE) ×1 IMPLANT
TRAY FOLEY MTR SLVR 16FR STAT (SET/KITS/TRAYS/PACK) ×1 IMPLANT
WATER STERILE IRR 1000ML POUR (IV SOLUTION) ×1 IMPLANT

## 2024-04-08 NOTE — H&P (Signed)
 BP (!) 150/82   Pulse 88   Temp 98.4 F (36.9 C)   Resp 18   Ht 5\' 3"  (1.6 m)   Wt 74.4 kg   LMP 09/30/2014   SpO2 100%   BMI 29.05 kg/m   Mary Holt returned today so we could go over the MRI of the lumbar spine.  What it shows is that she has classic adjacent segment disease at L3-4.  She has a solid arthrodesis at L4-5 which we knew, but she is now stenotic at L3-4.  There is no hardware there, nor was there any type of operative treatment at the L3-4 level.  But, she is stenotic, has neurogenic claudication and all of this is unfortunately consistent with adjacent segment disease.   She weighs 169 pounds.  Temperature is 98.6, blood pressure 167/80, pulse 85.  Pain is 5/10.   I have offered decompressive surgery if indeed that is what she would like, but she also needs some time to think about this.  The problem now certainly was induced by having the previous operation in '14 at L4-5, but, we fixed that problem then and now she has a new problem.   Currently she is taking Ibuprofen, Oxycodone, Ozempic.  ALLERGIES: She is allergic to Naprosyn and Penicillin.  EXAM: She has full strength in the upper and lower extremities.  Normal muscle tone, bulk, coordination.  Speech is clear and fluent.    Mary Holt has decided she does not want hardware or a fusion at this time. We will proceed with a simple decompression at L3/4. Risks and benefits including no pain relief, bleeding, infection, csf leak, need for more surgery , weakness, bowel and or bladder dysfunction along with other risks were discussed. She understands and wishes to proceed.

## 2024-04-08 NOTE — Op Note (Signed)
 04/08/2024  11:42 AM  PATIENT:  Mary Holt  63 y.o. female  PRE-OPERATIVE DIAGNOSIS:  Spondylolisthesis, lumbar region Lumbar stenosis with neurogenic claudication L3/4 POST-OPERATIVE DIAGNOSIS:  same  PROCEDURE:  Procedure(s): LUMBAR LAMINECTOMY/DECOMPRESSION  LUMBAR THREE-FOUR  SURGEON:   Surgeon(s): Audie Bleacher, MD  ASSISTANTS:none  ANESTHESIA:   general  EBL:  Total I/O In: 1100 [I.V.:1000; IV Piggyback:100] Out: 20 [Blood:20]  BLOOD ADMINISTERED:none  CELL SAVER GIVEN:not used  COUNT:per nursing  DRAINS: none   SPECIMEN:  No Specimen  DICTATION: [name] was taken to the operating room, intubated and placed under a general anesthetic without difficulty. She was positioned prone on a Sunderlin frame with all pressure points padded. Her back was prepped and draped in a sterile manner. I opened the skin with a 10 blade and carried the dissection down to the thoracolumbar fascia. I used both sharp dissection and the monopolar cautery to expose the lamina of L3, and the L4 screws. I confirmed my location by exposing the L4 screws I used the drill, Kerrison punches, and curettes to perform a hemilaminectomy of L3. I used the punches to remove the ligamentum flavum to expose the thecal sac. I started decompressing the spinal canal, thecal sac and L4, and L5 root(s). I explored rostrally, laterally, medially, and caudally and was satisfied with the decompression. I irrigated the wound, then closed in layers. I approximated the thoracolumbar fascia, subcutaneous, and subcuticular planes with vicryl sutures. I used dermabond for a sterile dressing.   PLAN OF CARE: Admit for overnight observation  PATIENT DISPOSITION:  PACU - hemodynamically stable.   Delay start of Pharmacological VTE agent (>24hrs) due to surgical blood loss or risk of bleeding:  yes

## 2024-04-08 NOTE — Plan of Care (Signed)

## 2024-04-08 NOTE — Transfer of Care (Signed)
 Immediate Anesthesia Transfer of Care Note  Patient: AMEIRA ALESSANDRINI  Procedure(s) Performed: LUMBAR LAMINECTOMY/DECOMPRESSION MICRODISCECTOMY LUMBAR THREE-FOUR  Patient Location: PACU  Anesthesia Type:General  Level of Consciousness: awake and alert   Airway & Oxygen Therapy: Patient Spontanous Breathing and Patient connected to face mask oxygen  Post-op Assessment: Report given to RN and Post -op Vital signs reviewed and stable  Post vital signs: Reviewed and stable  Last Vitals:  Vitals Value Taken Time  BP 150/89 04/08/24 1130  Temp    Pulse 112 04/08/24 1134  Resp 13 04/08/24 1134  SpO2 95 % 04/08/24 1134  Vitals shown include unfiled device data.  Last Pain:  Vitals:   04/08/24 0729  PainSc: 0-No pain         Complications: No notable events documented.

## 2024-04-08 NOTE — Anesthesia Postprocedure Evaluation (Signed)
 Anesthesia Post Note  Patient: [name]  Procedure(s) Performed: LUMBAR LAMINECTOMY/DECOMPRESSION MICRODISCECTOMY LUMBAR THREE-FOUR     Patient location during evaluation: PACU Anesthesia Type: General Level of consciousness: awake and alert, patient cooperative and oriented Pain management: pain level controlled Vital Signs Assessment: post-procedure vital signs reviewed and stable Respiratory status: spontaneous breathing, nonlabored ventilation and respiratory function stable Cardiovascular status: blood pressure returned to baseline and stable Postop Assessment: no apparent nausea or vomiting Anesthetic complications: no   No notable events documented.  Last Vitals:  Vitals:   04/08/24 1315 04/08/24 1321  BP: (!) 143/86   Pulse: 93 92  Resp: 17 15  Temp:  36.9 C  SpO2: 94% 93%    Last Pain:  Vitals:   04/08/24 1239  PainSc: 5                  Arlander Gillen,E. Dewanda Fennema

## 2024-04-08 NOTE — Anesthesia Procedure Notes (Signed)
 Procedure Name: Intubation Date/Time: 04/08/2024 9:34 AM  Performed by: Johann Muta, CRNAPre-anesthesia Checklist: Patient identified, Emergency Drugs available, Suction available and Patient being monitored Patient Re-evaluated:Patient Re-evaluated prior to induction Oxygen Delivery Method: Circle System Utilized Preoxygenation: Pre-oxygenation with 100% oxygen Induction Type: IV induction Ventilation: Mask ventilation without difficulty Laryngoscope Size: Mac and 3 Grade View: Grade I Tube type: Oral Number of attempts: 1 Airway Equipment and Method: Stylet and Oral airway Placement Confirmation: ETT inserted through vocal cords under direct vision, positive ETCO2 and breath sounds checked- equal and bilateral Secured at: 22 cm Tube secured with: Tape Dental Injury: Teeth and Oropharynx as per pre-operative assessment

## 2024-04-09 ENCOUNTER — Encounter (HOSPITAL_COMMUNITY): Payer: Self-pay | Admitting: Neurosurgery

## 2024-04-09 DIAGNOSIS — M4316 Spondylolisthesis, lumbar region: Secondary | ICD-10-CM | POA: Diagnosis not present

## 2024-04-09 MED ORDER — PNEUMOCOCCAL 20-VAL CONJ VACC 0.5 ML IM SUSY
0.5000 mL | PREFILLED_SYRINGE | INTRAMUSCULAR | Status: AC | PRN
  Administered 2024-04-10: 0.5 mL via INTRAMUSCULAR
  Filled 2024-04-09: qty 0.5

## 2024-04-09 NOTE — Evaluation (Signed)
 Physical Therapy Evaluation Patient Details Name: Mary Holt MRN: [id number] DOB: Jun 15, 1961 Today's Date: 04/09/2024  History of Present Illness  Pt is a 63 yo female admitted to [facility] on 04/08/24 for elective lumbar laminectomy and decompression L3-L4. PMH of GERD, DM, Arthritis, OSA not on CPAP.  Clinical Impression  Pt is presenting below baseline level of functioning. Currently pt is Supervision for bed mobility, CGA for sit to stand, gait and steps. Pt requires a RW due to generalized LE weakness following surgery with Moderate UE support during gait. Pt reports she can get some help from her son and sister after discharge from acute care hospital. Recommending to follow physician recommendation for physical therapy once discharged from acute care hospital setting. Pt will benefit from continued skilled physical therapy services in acute care hospital setting at this time.          If plan is discharge home, recommend the following: A little help with walking and/or transfers;Assist for transportation;Help with stairs or ramp for entrance;Assistance with cooking/housework     Equipment Recommendations None recommended by PT     Functional Status Assessment Patient has had a recent decline in their functional status and demonstrates the ability to make significant improvements in function in a reasonable and predictable amount of time.     Precautions / Restrictions Precautions Precautions: Fall;Back Precaution Booklet Issued: No Recall of Precautions/Restrictions: Intact Precaution/Restrictions Comments: Reviewed back precautions Restrictions Weight Bearing Restrictions Per Provider Order: No      Mobility  Bed Mobility Overal bed mobility: Needs Assistance Bed Mobility: Rolling, Sidelying to Sit, Sit to Sidelying Rolling: Modified independent (Device/Increase time) Sidelying to sit: Supervision     Sit to sidelying: Supervision General bed mobility comments: significant  increase in time with slow movements at very edge of bed. Supervision for safety at this time.    Transfers Overall transfer level: Needs assistance Equipment used: Rolling walker (2 wheels) Transfers: Sit to/from Stand Sit to Stand: Contact guard assist           General transfer comment: CGA for safety due to weakness in bil LE with increased time to get from knee flexion to full upright standing.    Ambulation/Gait Ambulation/Gait assistance: Contact guard assist Gait Distance (Feet): 200 Feet Assistive device: Rolling walker (2 wheels) Gait Pattern/deviations: Step-through pattern, Decreased step length - left, Decreased stance time - right, Step-to pattern Gait velocity: decreased Gait velocity interpretation: <1.31 ft/sec, indicative of household ambulator   General Gait Details: initially very low floor clearance with squeaking of sock on floor, improve significantly with increased gait; initially step to to partial step through gait pattern that also improved to short steps with reciprocal gait pattern.  Stairs Stairs: Yes Stairs assistance: Contact guard assist Stair Management: Two rails, Step to pattern, Forwards Number of Stairs: 2 General stair comments: 2 steps with CGA for safety and cueing for sequencing.    Balance Overall balance assessment: Needs assistance Sitting-balance support: No upper extremity supported, Feet supported Sitting balance-Leahy Scale: Fair Sitting balance - Comments: sitting in recliner   Standing balance support: Bilateral upper extremity supported, During functional activity Standing balance-Leahy Scale: Fair Standing balance comment: Able to stand at sink and take medications RN gave pt without UE support. Requires UE support during ambulation         Pertinent Vitals/Pain Pain Assessment Pain Assessment: No/denies pain Pain Score: 9  Pain Location: back, op site Pain Descriptors / Indicators: Sore Pain Intervention(s):  Limited activity within patient's  tolerance, Monitored during session, RN gave pain meds during session    Home Living Family/patient expects to be discharged to:: Private residence Living Arrangements: Alone   Type of Home: House Home Access: Stairs to enter Entrance Stairs-Rails: Can reach both;Left;Right Entrance Stairs-Number of Steps: 4   Home Layout: One level Home Equipment: Agricultural consultant (2 wheels);BSC/3in1;Adaptive equipment Additional Comments: Pt reports good family support and that someone can stay with her at all times if needed.    Prior Function Prior Level of Function : Independent/Modified Independent             Mobility Comments: ind no AD ADLs Comments: Ind     Extremity/Trunk Assessment   Upper Extremity Assessment Upper Extremity Assessment: Overall WFL for tasks assessed    Lower Extremity Assessment Lower Extremity Assessment: Generalized weakness    Cervical / Trunk Assessment Cervical / Trunk Assessment: Back Surgery  Communication   Communication Communication: No apparent difficulties    Cognition Arousal: Alert Behavior During Therapy: WFL for tasks assessed/performed   PT - Cognitive impairments: No apparent impairments       Following commands: Intact       Cueing Cueing Techniques: Verbal cues     General Comments General comments (skin integrity, edema, etc.): Pt incision is intact and dry no drainage noted. No signs/symptoms of cardiac/respirtory distress during session.        Assessment/Plan    PT Assessment Patient needs continued PT services  PT Problem List Decreased strength;Decreased mobility;Decreased activity tolerance;Decreased balance;Pain       PT Treatment Interventions DME instruction;Therapeutic exercise;Gait training;Balance training;Stair training;Neuromuscular re-education;Functional mobility training;Patient/family education    PT Goals (Current goals can be found in the Care Plan section)   Acute Rehab PT Goals Patient Stated Goal: To improve mobility PT Goal Formulation: With patient Time For Goal Achievement: 04/23/24 Potential to Achieve Goals: Good    Frequency Min 3X/week        AM-PAC PT "6 Clicks" Mobility  Outcome Measure Help needed turning from your back to your side while in a flat bed without using bedrails?: A Little Help needed moving from lying on your back to sitting on the side of a flat bed without using bedrails?: A Little Help needed moving to and from a bed to a chair (including a wheelchair)?: A Little Help needed standing up from a chair using your arms (e.g., wheelchair or bedside chair)?: A Little Help needed to walk in hospital room?: A Little Help needed climbing 3-5 steps with a railing? : A Little 6 Click Score: 18    End of Session Equipment Utilized During Treatment: Gait belt Activity Tolerance: Patient tolerated treatment well Patient left: in bed;with call bell/phone within reach Nurse Communication: Mobility status PT Visit Diagnosis: Other abnormalities of gait and mobility (R26.89);Unsteadiness on feet (R26.81)    Time: 7829-5621 PT Time Calculation (min) (ACUTE ONLY): 35 min   Charges:   PT Evaluation $PT Eval Low Complexity: 1 Low PT Treatments $Therapeutic Activity: 8-22 mins PT General Charges $$ ACUTE PT VISIT: 1 Visit       Sloan Duncans, DPT, CLT  Acute Rehabilitation Services Office: [phone] (Secure chat preferred)   Jenice Mitts 04/09/2024, 11:36 AM

## 2024-04-09 NOTE — Plan of Care (Signed)

## 2024-04-09 NOTE — Plan of Care (Signed)
  Problem: Education: Goal: Knowledge of General Education information will improve Description: Including pain rating scale, medication(s)/side effects and non-pharmacologic comfort measures Outcome: Progressing   Problem: Health Behavior/Discharge Planning: Goal: Ability to manage health-related needs will improve Outcome: Progressing   Problem: Clinical Measurements: Goal: Ability to maintain clinical measurements within normal limits will improve Outcome: Progressing Goal: Will remain free from infection Outcome: Progressing Goal: Diagnostic test results will improve Outcome: Progressing Goal: Respiratory complications will improve Outcome: Progressing   Problem: Activity: Goal: Risk for activity intolerance will decrease Outcome: Progressing   Problem: Nutrition: Goal: Adequate nutrition will be maintained Outcome: Progressing   Problem: Coping: Goal: Level of anxiety will decrease Outcome: Progressing   Problem: Elimination: Goal: Will not experience complications related to bowel motility Outcome: Progressing   Problem: Activity: Goal: Ability to avoid complications of mobility impairment will improve Outcome: Progressing Goal: Ability to tolerate increased activity will improve Outcome: Progressing Goal: Will remain free from falls Outcome: Progressing

## 2024-04-09 NOTE — Progress Notes (Addendum)
 Patient ID: Mary Holt, female   DOB: [birth year], 63 y.o.   MRN: [id number] BP 114/61 (BP Location: Right Arm)   Pulse 98   Temp 99.3 F (37.4 C) (Oral)   Resp 18   Ht 5\' 3"  (1.6 m)   Wt 74.4 kg   LMP 09/30/2014   SpO2 95%   BMI 29.05 kg/m  Alert and oriented x 4 Wound is clean, dry, no signs of infection.  Complaining of more pain than expected Did not believe she could go home today. Did ask for stronger pain medication. She is receiving more than adequate medication.  Continue to work with Pt.  Normal strength in lower extremities

## 2024-04-09 NOTE — Evaluation (Signed)
 Occupational Therapy Evaluation Patient Details Name: Mary Holt MRN: [id number] DOB: Sep 24, 1961 Today's Date: 04/09/2024   History of Present Illness   Pt is a 63 yo female admitted to [facility] on 04/08/24 for elective lumbar laminectomy and decompression L3-L4. PMH of GERD, DM, Arthritis, OSA not on CPAP.     Clinical Impressions Pt admitted for above, PTA pt was ind no AD for mobility and ind with ADLs. Pt currently limited by back pain and reports moving much better yesterday. Educated pt on back precautions and compensatory strategies for ADLs, pt needing CGA + RW for ambulation and Max A to setup A for ADLs, unable to perform any LB ADLs without assist. Pt reports she can arrange for family to be present with her at all times if needed, pt would benefit from further AE education if looking to DC if she is not able to progress with ADLs with further pain management. OT to continue following pt acutely to address listed deficits and help transition to next level of care. Patient would benefit from post acute Home OT services to help maximize functional independence in natural environment as long as pt has direct supervision and assistance with ADLs/OOB mobility.      If plan is discharge home, recommend the following:   Assistance with cooking/housework;Help with stairs or ramp for entrance;A lot of help with bathing/dressing/bathroom     Functional Status Assessment   Patient has had a recent decline in their functional status and demonstrates the ability to make significant improvements in function in a reasonable and predictable amount of time.     Equipment Recommendations   Tub/shower seat;Tub/shower bench (tub bench preferrably)     Recommendations for Other Services         Precautions/Restrictions   Precautions Precautions: Fall;Back Precaution Booklet Issued: Yes (comment) Recall of Precautions/Restrictions: Intact Precaution/Restrictions Comments: Reviewed  back precautions Required Braces or Orthoses:  (no brace needed) Restrictions Weight Bearing Restrictions Per Provider Order: No     Mobility Bed Mobility               General bed mobility comments: Pt OOB on arrival, discussed/reviewed log roll with pt to which she reports recall from prior back sx. Mod A to assist with scooting hips back into recliner    Transfers Overall transfer level: Needs assistance Equipment used: Rolling walker (2 wheels) Transfers: Sit to/from Stand Sit to Stand: Contact guard assist           General transfer comment: STS from toilet      Balance Overall balance assessment: Needs assistance Sitting-balance support: No upper extremity supported, Feet supported Sitting balance-Leahy Scale: Fair Sitting balance - Comments: sitting in recliner   Standing balance support: Bilateral upper extremity supported, During functional activity Standing balance-Leahy Scale: Fair Standing balance comment: Static stands no AD, UE support needed for dynamic bal                           ADL either performed or assessed with clinical judgement   ADL Overall ADL's : Needs assistance/impaired Eating/Feeding: Independent;Sitting   Grooming: Standing;Contact guard assist;Oral care Grooming Details (indicate cue type and reason): Educated pt on sink strategies using cups for spit/rinse Upper Body Bathing: Standing;Contact guard assist   Lower Body Bathing: Sitting/lateral leans;Maximal assistance   Upper Body Dressing : Sitting;Supervision/safety Upper Body Dressing Details (indicate cue type and reason): doff/don gown Lower Body Dressing: Maximal assistance;Sitting/lateral leans Lower Body Dressing  Details (indicate cue type and reason): Unable to acheive figure four position Toilet Transfer: Contact guard assist;Rolling walker (2 wheels);Ambulation Toilet Transfer Details (indicate cue type and reason): BSC over toilet Toileting- Clothing  Manipulation and Hygiene: Sitting/lateral lean;Set up       Functional mobility during ADLs: Contact guard assist;Rolling walker (2 wheels) General ADL Comments: Educated pt on POB precautions, discussed adjusting items in home to shoulder level.     Vision Baseline Vision/History: 1 Wears glasses Ability to See in Adequate Light: 0 Adequate Patient Visual Report: No change from baseline Vision Assessment?: No apparent visual deficits     Perception         Praxis         Pertinent Vitals/Pain Pain Assessment Pain Assessment: 0-10 Pain Score: 9  Pain Location: back, op site Pain Descriptors / Indicators: Sore Pain Intervention(s): Limited activity within patient's tolerance, Monitored during session, RN gave pain meds during session     Extremity/Trunk Assessment Upper Extremity Assessment Upper Extremity Assessment: Overall WFL for tasks assessed   Lower Extremity Assessment Lower Extremity Assessment: Defer to PT evaluation   Cervical / Trunk Assessment Cervical / Trunk Assessment: Back Surgery   Communication Communication Communication: No apparent difficulties   Cognition Arousal: Alert Behavior During Therapy: WFL for tasks assessed/performed Cognition: No apparent impairments                               Following commands: Intact       Cueing  General Comments   Cueing Techniques: Verbal cues      Exercises     Shoulder Instructions      Home Living Family/patient expects to be discharged to:: Private residence Living Arrangements: Alone   Type of Home: House Home Access: Stairs to enter Secretary/administrator of Steps: 4 Entrance Stairs-Rails: Can reach both;Left;Right Home Layout: One level     Bathroom Shower/Tub: Tub/shower unit;Curtain   Firefighter: Standard     Home Equipment: Agricultural consultant (2 wheels);BSC/3in1;Adaptive equipment Adaptive Equipment: Reacher Additional Comments: Pt reports good family  support and that someone can stay with her at all times if needed.      Prior Functioning/Environment Prior Level of Function : Independent/Modified Independent             Mobility Comments: ind no AD ADLs Comments: Ind    OT Problem List: Impaired balance (sitting and/or standing);Pain   OT Treatment/Interventions: Self-care/ADL training;Balance training;Therapeutic exercise;Therapeutic activities;Patient/family education      OT Goals(Current goals can be found in the care plan section)   Acute Rehab OT Goals Patient Stated Goal: To go home OT Goal Formulation: With patient Time For Goal Achievement: 04/23/24 Potential to Achieve Goals: Good ADL Goals Pt Will Perform Grooming: with modified independence;standing Pt Will Perform Lower Body Bathing: with set-up;sitting/lateral leans Pt Will Perform Lower Body Dressing: with set-up;sit to/from stand;sitting/lateral leans Pt Will Transfer to Toilet: with supervision;ambulating Pt Will Perform Tub/Shower Transfer: Tub transfer;ambulating;shower seat   OT Frequency:  Min 2X/week    Co-evaluation              AM-PAC OT "6 Clicks" Daily Activity     Outcome Measure Help from another person eating meals?: None Help from another person taking care of personal grooming?: A Little Help from another person toileting, which includes using toliet, bedpan, or urinal?: A Little Help from another person bathing (including washing, rinsing, drying)?: A Lot Help  from another person to put on and taking off regular upper body clothing?: A Little Help from another person to put on and taking off regular lower body clothing?: A Lot 6 Click Score: 17   End of Session Equipment Utilized During Treatment: Rolling walker (2 wheels) Nurse Communication: Mobility status  Activity Tolerance: Patient tolerated treatment well Patient left: in chair;with call bell/phone within reach;with chair alarm set  OT Visit Diagnosis:  Unsteadiness on feet (R26.81);Other abnormalities of gait and mobility (R26.89);Pain Pain - Right/Left:  (back)                Time: 1610-9604 OT Time Calculation (min): 40 min Charges:  OT General Charges $OT Visit: 1 Visit OT Evaluation $OT Eval Low Complexity: 1 Low OT Treatments $Self Care/Home Management : 8-22 mins $Therapeutic Activity: 8-22 mins  04/09/2024  AB, OTR/L  Acute Rehabilitation Services  Office: [phone]   Jorene New 04/09/2024, 9:42 AM

## 2024-04-09 NOTE — TOC Initial Note (Addendum)
 Transition of Care [facility]) - Initial/Assessment Note    Patient Details  Name: Mary Holt MRN: [id number] Date of Birth: 17-Aug-1961  [facility]) CM/SW Contact:    Kermit Balo, RN Phone Number: 04/09/2024, 1:56 PM  Clinical Narrative:                  Pt is from home alone. She states her son and sister will check on her and assist as needed.  She denies issues with transportation or home medications. Therapies recommending home health therapies. [facility] is working to see if they are in network: [facility], [facility], [facility], [facility] are not in network.  Pt has transportation home at dc.   1544: [facility] and [facility] are not in network with her insurance, [facility] is running to see if in network.  Expected Discharge Plan: Home w Home Health Services     Patient Goals and CMS Choice     Choice offered to / list presented to : Patient      Expected Discharge Plan and Services   Discharge Planning Services: CM Consult Post Acute Care Choice: Home Health Living arrangements for the past 2 months: Single Family Home                           HH Arranged: PT, OT          Prior Living Arrangements/Services Living arrangements for the past 2 months: Single Family Home Lives with:: Self Patient language and need for interpreter reviewed:: Yes Do you feel safe going back to the place where you live?: Yes        Care giver support system in place?: Yes (comment) Current home services: DME (walker/ elevated toilet) Criminal Activity/Legal Involvement Pertinent to Current Situation/Hospitalization: No - Comment as needed  Activities of Daily Living   ADL Screening (condition at time of admission) Independently performs ADLs?: Yes (appropriate for developmental age) Is the patient deaf or have difficulty hearing?: No Does the patient have difficulty seeing, even when wearing glasses/contacts?: No Does the patient have difficulty concentrating,  remembering, or making decisions?: No  Permission Sought/Granted                  Emotional Assessment Appearance:: Appears stated age Attitude/Demeanor/Rapport: Engaged Affect (typically observed): Accepting Orientation: : Oriented to Self, Oriented to Place, Oriented to  Time, Oriented to Situation   Psych Involvement: No (comment)  Admission diagnosis:  Spondylolisthesis, lumbar region [M43.16] Lumbar stenosis with neurogenic claudication [M48.062] Patient Active Problem List   Diagnosis Date Noted   Lumbar stenosis with neurogenic claudication 04/08/2024   Diabetes mellitus (HCC) 02/21/2024   COVID-19 vaccination declined 02/21/2024   Class 1 obesity due to excess calories with serious comorbidity and body mass index (BMI) of 30.0 to 30.9 in adult 02/21/2024   Sleep apnea, unspecified 07/27/2021   Pure hypercholesterolemia 05/02/2020   Abnormal cardiac CT angiography 02/26/2020   Angina pectoris (HCC) 02/22/2020   Coronary artery disease involving native coronary artery of native heart with angina pectoris (HCC) 02/22/2020   Chest pain of uncertain etiology 01/06/2020   Snoring 01/06/2020   Mitral valve prolapse 01/06/2020   History of mitral valve prolapse 12/07/2019   Rash 02/02/2019   Loss of hair 02/02/2019   Unilateral primary osteoarthritis, left knee 10/28/2017   Chronic pain of left knee 09/30/2017   BMI 31.0-31.9,adult 10/02/2014   Hypertensive heart disease without heart failure 10/02/2014   PCP:  Susanna Epley, FNP Pharmacy:   [facility] [phone] - [place], [place] - [address] AT [facility] [address] [place] [address] Phone: [phone] Fax: [phone]  [facility] Delivery - [place], [place] - [address] [place] [address] Phone: [phone] Fax: [phone]     Social Drivers of Health (SDOH) Social History: SDOH Screenings   Food  Insecurity: No Food Insecurity (04/08/2024)  Housing: Low Risk  (04/08/2024)  Transportation Needs: No Transportation Needs (04/08/2024)  Utilities: Not At Risk (04/08/2024)  Depression (PHQ2-9): Low Risk  (02/11/2024)  Tobacco Use: Low Risk  (04/08/2024)   SDOH Interventions:     Readmission Risk Interventions     No data to display

## 2024-04-10 DIAGNOSIS — M4316 Spondylolisthesis, lumbar region: Secondary | ICD-10-CM | POA: Diagnosis not present

## 2024-04-10 MED ORDER — POLYETHYLENE GLYCOL 3350 17 G PO PACK
17.0000 g | PACK | Freq: Every day | ORAL | Status: DC
  Administered 2024-04-10 – 2024-04-12 (×3): 17 g via ORAL
  Filled 2024-04-10 (×3): qty 1

## 2024-04-10 NOTE — TOC Progression Note (Signed)
 Transition of Care [facility]) - Progression Note    Patient Details  Name: Mary Holt MRN: [id number] Date of Birth: 1961-03-23  [facility]) CM/SW Contact  Ronni Colace, RN Phone Number: 04/10/2024, 11:36 AM  Clinical Narrative:     Unable to find Home health for patient with particular insurance. Nursing aware.  [facility], they are closed today.   Expected Discharge Plan: Home w Home Health Services    Expected Discharge Plan and Services   Discharge Planning Services: CM Consult Post Acute Care Choice: Home Health Living arrangements for the past 2 months: Single Family Home                           HH Arranged: PT, OT           Social Determinants of Health (SDOH) Interventions SDOH Screenings   Food Insecurity: No Food Insecurity (04/08/2024)  Housing: Low Risk  (04/08/2024)  Transportation Needs: No Transportation Needs (04/08/2024)  Utilities: Not At Risk (04/08/2024)  Depression (PHQ2-9): Low Risk  (02/11/2024)  Tobacco Use: Low Risk  (04/08/2024)    Readmission Risk Interventions     No data to display

## 2024-04-10 NOTE — Plan of Care (Signed)
  Problem: Clinical Measurements: Goal: Will remain free from infection Outcome: Not Progressing   Problem: Activity: Goal: Risk for activity intolerance will decrease Outcome: Not Progressing   Problem: Elimination: Goal: Will not experience complications related to bowel motility Outcome: Not Progressing   Problem: Pain Managment: Goal: General experience of comfort will improve and/or be controlled Outcome: Not Progressing   Problem: Safety: Goal: Ability to remain free from injury will improve Outcome: Not Progressing

## 2024-04-10 NOTE — TOC Progression Note (Addendum)
 Transition of Care [facility]) - Progression Note    Patient Details  Name: Mary Holt MRN: [id number] Date of Birth: [birth year]  Transition of Care [facility]) CM/SW Contact  Ronni Colace, RN Phone Number: 04/10/2024, 11:41 AM  Clinical Narrative:    Unable to get home health due to insurance. Called and spoke to patient regarding inability to find Home health. She can call the office on Monday to see if they can assist. She already has a follow up appt, has a ride home when discharged, and  has a walker and BSC if needed. Her bathroom is in her bedroom, she has a grabber also. She can find someone to stay with her during day  No barriers to discharge.  Did refer to [facility] neuro for OP  information on AVS  Expected Discharge Plan: Home w Home Health Services    Expected Discharge Plan and Services   Discharge Planning Services: CM Consult Post Acute Care Choice: Home Health Living arrangements for the past 2 months: Single Family Home                           HH Arranged: PT, OT           Social Determinants of Health (SDOH) Interventions SDOH Screenings   Food Insecurity: No Food Insecurity (04/08/2024)  Housing: Low Risk  (04/08/2024)  Transportation Needs: No Transportation Needs (04/08/2024)  Utilities: Not At Risk (04/08/2024)  Depression (PHQ2-9): Low Risk  (02/11/2024)  Tobacco Use: Low Risk  (04/08/2024)    Readmission Risk Interventions     No data to display

## 2024-04-10 NOTE — Plan of Care (Signed)
  Problem: Education: Goal: Knowledge of General Education information will improve Description: Including pain rating scale, medication(s)/side effects and non-pharmacologic comfort measures Outcome: Progressing   Problem: Health Behavior/Discharge P

## 2024-04-10 NOTE — Progress Notes (Signed)
 Occupational Therapy Treatment Patient Details Name: Mary Holt MRN: [id number] DOB: [birth year] Today's Date: 04/10/2024   History of present illness Pt is a 63 yo female admitted to [facility] on 04/08/24 for elective lumbar laminectomy and decompression L3-L4. PMH of GERD, DM, Arthritis, OSA not on CPAP.   OT comments  Pt progressing slowly towards pt focused goals, discussed use of AE to compensate for pt deficits with LDB/LBB and reviewed alternative options for getting dressing. Pt overall CGA for OOB mobility, demonstrated ability to complete Tub transfer with tub bench as her BLEs are not rising high enough to step over on this date. OT to continue to progress pt as able, DC plans remain appropriate for [facility] but strongly suggest pt have support from family for ADLs before DC.      If plan is discharge home, recommend the following:  Assistance with cooking/housework;Help with stairs or ramp for entrance;A lot of help with bathing/dressing/bathroom   Equipment Recommendations  Tub/shower bench    Recommendations for Other Services      Precautions / Restrictions Precautions Precautions: Fall;Back Precaution Booklet Issued: Yes (comment) Recall of Precautions/Restrictions: Intact Precaution/Restrictions Comments: recalls 3/3 back precautions Required Braces or Orthoses: Other Brace Other Brace: no brace needed Restrictions Weight Bearing Restrictions Per Provider Order: No       Mobility Bed Mobility Overal bed mobility: Needs Assistance Bed Mobility: Rolling, Sidelying to Sit Rolling: Modified independent (Device/Increase time) Sidelying to sit: Supervision       General bed mobility comments: significant increase in time with slow movements at very edge of bed. Supervision for safety at this time. Pt reports having iron bed frame and strudy night stand to perform task ind    Transfers Overall transfer level: Needs assistance Equipment used: Rolling walker (2  wheels) Transfers: Sit to/from Stand Sit to Stand: Contact guard assist                 Balance Overall balance assessment: Needs assistance Sitting-balance support: No upper extremity supported, Feet supported       Standing balance support: Bilateral upper extremity supported, During functional activity Standing balance-Leahy Scale: Fair                             ADL either performed or assessed with clinical judgement   ADL Overall ADL's : Needs assistance/impaired     Grooming: Standing;Oral care;Supervision/safety   Upper Body Bathing: Sitting;Set up   Lower Body Bathing: Sitting/lateral leans;Maximal assistance Lower Body Bathing Details (indicate cue type and reason): discussed use of LH sponge for LBB. Upper Body Dressing : Sitting;Supervision/safety   Lower Body Dressing: Maximal assistance;Sitting/lateral leans Lower Body Dressing Details (indicate cue type and reason): Discussed use of AE as pt is still unable to do LBD. Toilet Transfer: Contact guard assist;Rolling walker (2 wheels);Ambulation Toilet Transfer Details (indicate cue type and reason): BSC over toilet Toileting- Clothing Manipulation and Hygiene: Sitting/lateral lean;Set up   Tub/ Shower Transfer: Rolling walker (2 wheels);Tub transfer;Tub bench;Contact guard assist Tub/Shower Transfer Details (indicate cue type and reason): significant increased time Functional mobility during ADLs: Contact guard assist;Rolling walker (2 wheels) General ADL Comments: Discussed with pt concerns over ability to perform ADLs, reveiwed AE that would be beneficial-pt insists she will likely have someone assist at home and that she would be fine either way. DIscussed wearing robes or Moo Moos to fully clothe pt without having to complete Lb aspect of dressing. Pt using  good strategy of switching hands to dispose of dirty laundry into basket without twisting.    Extremity/Trunk Assessment Upper Extremity  Assessment Upper Extremity Assessment: Overall WFL for tasks assessed       Cervical / Trunk Assessment Cervical / Trunk Assessment: Back Surgery    Vision       Perception     Praxis     Communication     Cognition                                              Cueing      Exercises      Shoulder Instructions       General Comments      Pertinent Vitals/ Pain       Pain Assessment Pain Assessment: Faces Faces Pain Scale: Hurts even more Pain Location: back, op site Pain Descriptors / Indicators: Sore Pain Intervention(s): Limited activity within patient's tolerance, Monitored during session, Repositioned  Home Living                                          Prior Functioning/Environment              Frequency  Min 2X/week        Progress Toward Goals  OT Goals(current goals can now be found in the care plan section)  Progress towards OT goals: Progressing toward goals (slowly)  Acute Rehab OT Goals Patient Stated Goal: To go home OT Goal Formulation: With patient Time For Goal Achievement: 04/23/24 Potential to Achieve Goals: Good  Plan      Co-evaluation                 AM-PAC OT 6 Clicks Daily Activity     Outcome Measure   Help from another person eating meals?: None Help from another person taking care of personal grooming?: A Little Help from another person toileting, which includes using toliet, bedpan, or urinal?: A Little Help from another person bathing (including washing, rinsing, drying)?: A Lot Help from another person to put on and taking off regular upper body clothing?: A Little Help from another person to put on and taking off regular lower body clothing?: A Lot 6 Click Score: 17    End of Session Equipment Utilized During Treatment: Rolling walker (2 wheels);Gait belt  OT Visit Diagnosis: Unsteadiness on feet (R26.81);Other abnormalities of gait and mobility  (R26.89);Pain Pain - Right/Left:  (back)   Activity Tolerance Patient tolerated treatment well   Patient Left in chair;with call bell/phone within reach;with chair alarm set   Nurse Communication Mobility status        Time: 9162-9079 OT Time Calculation (min): 43 min  Charges: OT General Charges $OT Visit: 1 Visit OT Treatments $Self Care/Home Management : 8-22 mins $Therapeutic Activity: 23-37 mins  04/10/2024  AB, OTR/L  Acute Rehabilitation Services  Office: [phone]   Mary Holt 04/10/2024, 10:03 AM

## 2024-04-10 NOTE — Progress Notes (Signed)
 Patient ID: Mary Holt, female   DOB: 07/06/1961, 63 y.o.   MRN: [id number] BP (!) 134/95 (BP Location: Left Arm)   Pulse (!) 102   Temp 98.9 F (37.2 C) (Oral)   Resp 18   Ht 5\' 3"  (1.6 m)   Wt 74.4 kg   LMP 09/30/2014   SpO2 92%   BMI 29.05 kg/m  Alert, oriented x 4, speech is clear and fluent Still complaining of pain Continue with PT

## 2024-04-10 NOTE — Progress Notes (Signed)
 Physical Therapy Treatment Patient Details Name: Mary Holt MRN: [id number] DOB: 1961/11/25 Today's Date: 04/10/2024   History of Present Illness Pt is a 63 yo female admitted to [facility] on 04/08/24 for elective lumbar laminectomy and decompression L3-L4. PMH of GERD, DM, Arthritis, OSA not on CPAP.    PT Comments  Pt is progressing towards goals. Mod I for bed mobility, gait and stairs this session. Pt continues to require supervision for standing from EOB but Mod I from toilet due to height of bed compared to toilet pt needs increased time extending LE to get stable due to weakness/pain. Recommending to follow physician recommendation for physical therapy once discharged from acute care hospital setting.   Pt will benefit from continued skilled physical therapy services in acute care hospital setting at this time.  Pt is cleared from a PT perspective for home at Mod I on discharge from acute care hospital setting once medically stable.       If plan is discharge home, recommend the following: Assist for transportation;Assistance with cooking/housework     Equipment Recommendations  None recommended by PT       Precautions / Restrictions Precautions Precautions: Fall;Back Precaution Booklet Issued: No Recall of Precautions/Restrictions: Intact Precaution/Restrictions Comments: recalls 3/3 back precautions Required Braces or Orthoses: Other Brace Other Brace: no brace needed Restrictions Weight Bearing Restrictions Per Provider Order: No     Mobility  Bed Mobility Overal bed mobility: Modified Independent Bed Mobility: Rolling, Sidelying to Sit Rolling: Modified independent (Device/Increase time) Sidelying to sit: Modified independent (Device/Increase time)       General bed mobility comments: increased time to get to EOB.    Transfers Overall transfer level: Needs assistance Equipment used: Rolling walker (2 wheels) Transfers: Sit to/from Stand Sit to Stand: Supervision,  Modified independent (Device/Increase time)           General transfer comment: increased time to get to standing from EOB. Pt has slow guarded movements.Needs increased time to get to extended LE from flexion due to pain/weakness. Mod I from toilet with grab bar.    Ambulation/Gait Ambulation/Gait assistance: Modified independent (Device/Increase time) Gait Distance (Feet): 250 Feet Assistive device: Rolling walker (2 wheels) Gait Pattern/deviations: Step-through pattern, Decreased step length - left, Decreased stance time - right, Step-to pattern Gait velocity: decreased Gait velocity interpretation: 1.31 - 2.62 ft/sec, indicative of limited community ambulator   General Gait Details: Improved gloor clearance. Step through gait pattern with intermittent bouts of normal walking speed. Pt has been slow and guarded with movements. Good re-call of precautions throughout.   Stairs   Stairs assistance: Modified independent (Device/Increase time) Stair Management: Two rails, Step to pattern, Forwards Number of Stairs: 4 General stair comments: Mod I with increased time and step to gait pattern for steps per home set up.      Balance Overall balance assessment: Modified Independent Sitting-balance support: No upper extremity supported, Feet supported Sitting balance-Leahy Scale: Good Sitting balance - Comments: sitting EOB   Standing balance support: Bilateral upper extremity supported, During functional activity Standing balance-Leahy Scale: Fair Standing balance comment: Able to stand without UE support.      Communication Communication Communication: No apparent difficulties  Cognition Arousal: Alert Behavior During Therapy: WFL for tasks assessed/performed   PT - Cognitive impairments: No apparent impairments       Following commands: Intact            General Comments General comments (skin integrity, edema, etc.): Pt incision is intact and  dry, no drainage  noted. No signs/symptoms of cardiac/respiratory distress during session      Pertinent Vitals/Pain Pain Assessment Pain Assessment: 0-10 Pain Score: 6  Pain Location: back, op site Pain Descriptors / Indicators: Sore, Aching Pain Intervention(s): Monitored during session     PT Goals (current goals can now be found in the care plan section) Acute Rehab PT Goals Patient Stated Goal: To improve mobility PT Goal Formulation: With patient Time For Goal Achievement: 04/23/24 Potential to Achieve Goals: Good Progress towards PT goals: Progressing toward goals    Frequency    Min 3X/week      PT Plan  Continue with current POC        AM-PAC PT "6 Clicks" Mobility   Outcome Measure  Help needed turning from your back to your side while in a flat bed without using bedrails?: None Help needed moving from lying on your back to sitting on the side of a flat bed without using bedrails?: None Help needed moving to and from a bed to a chair (including a wheelchair)?: None Help needed standing up from a chair using your arms (e.g., wheelchair or bedside chair)?: A Little Help needed to walk in hospital room?: None Help needed climbing 3-5 steps with a railing? : None 6 Click Score: 23    End of Session Equipment Utilized During Treatment: Gait belt Activity Tolerance: Patient tolerated treatment well Patient left: in bed;with call bell/phone within reach;with nursing/sitter in room Nurse Communication: Mobility status PT Visit Diagnosis: Other abnormalities of gait and mobility (R26.89);Unsteadiness on feet (R26.81)     Time: 1610-9604 PT Time Calculation (min) (ACUTE ONLY): 23 min  Charges:    $Therapeutic Activity: 23-37 mins PT General Charges $$ ACUTE PT VISIT: 1 Visit                    Sloan Duncans, DPT, CLT  Acute Rehabilitation Services Office: [phone] (Secure chat preferred)    Jenice Mitts 04/10/2024, 1:09 PM

## 2024-04-11 DIAGNOSIS — M4316 Spondylolisthesis, lumbar region: Secondary | ICD-10-CM | POA: Diagnosis not present

## 2024-04-11 NOTE — Progress Notes (Signed)
 Patient ID: Mary Holt, female   DOB: Nov 17, 1961, 63 y.o.   MRN: [id number] Vital signs are stable Motor function appears good Incision is clean and dry Patient is planning for discharge tomorrow She would like to have a shower

## 2024-04-11 NOTE — Progress Notes (Signed)
 Occupational Therapy Treatment Patient Details Name: Mary Holt MRN: [id number] DOB: 1961/01/17 Today's Date: 04/11/2024   History of present illness Pt is a 63 yo female admitted to [facility] on 04/08/24 for elective lumbar laminectomy and decompression L3-L4. PMH of GERD, DM, Arthritis, OSA not on CPAP.   OT comments  Pt. Seen for skilled OT treatment session.  Pt. Reports feeling good about all LB ADLs and declined need for review of LB dressing or tub transfer again.  States greater concern was bed mobility.  Extensive review and education of log roll. Pt. Able to return demo with good tech. In/out multiple times at MOD I.  S for in room ambulation to/from b.room including toileting and standing grooming tasks.  Note d/c home likely tom.  Agree with current d/c recommendations.        If plan is discharge home, recommend the following:  Assistance with cooking/housework;Help with stairs or ramp for entrance;A lot of help with bathing/dressing/bathroom   Equipment Recommendations  Tub/shower bench    Recommendations for Other Services      Precautions / Restrictions Precautions Precautions: Fall;Back Recall of Precautions/Restrictions: Intact Precaution/Restrictions Comments: recalls 3/3 back precautions Other Brace: no brace needed       Mobility Bed Mobility Overal bed mobility: Modified Independent Bed Mobility: Sidelying to Sit, Sit to Sidelying   Sidelying to sit: Modified independent (Device/Increase time)     Sit to sidelying: Modified independent (Device/Increase time) General bed mobility comments: pt. demon in/out to eob x2. initially attempting to show how she would hold onto bedside table, reviewed it encouraged too much twisting.  pt. able to then use method of reaching for and holding onto side of mattress.  good tech. of guiding bles off of bed and also back into bed.  reports she remains fearful of spasms from previous surgeries affecting her speed and  initiation of bed mobilty.  once she demonstrated in/out the additional times she reported feeling more confident especially with no spasms during the bed mobility    Transfers Overall transfer level: Needs assistance Equipment used: Rolling walker (2 wheels) Transfers: Sit to/from Stand, Bed to chair/wheelchair/BSC Sit to Stand: Supervision, Modified independent (Device/Increase time)           General transfer comment: increased time to get to standing from EOB. Pt has slow guarded movements.Needs increased time to get to extended LE from flexion due to pain/weakness. Mod I from toilet with grab bar.     Balance                                           ADL either performed or assessed with clinical judgement   ADL Overall ADL's : Needs assistance/impaired     Grooming: Standing;Oral care;Supervision/safety                 Lower Body Dressing Details (indicate cue type and reason): Discussed use of AE as pt is still unable to do LBD., pt. reports she has a reacher if needed but also wears slip on shoes that are easy on/off at home Toilet Transfer: Supervision/safety;Regular Toilet;Rolling walker (2 wheels);Ambulation   Toileting- Clothing Manipulation and Hygiene: Sitting/lateral lean;Set up     Tub/Shower Transfer Details (indicate cue type and reason): declined need to review this transfer, has plans to purchase bench. also reviewed borrowing if needed and provided examples of used ones also  for purchase at [employer] Functional mobility during ADLs: Supervision/safety;Rolling walker (2 wheels)      Extremity/Trunk Assessment              Vision       Perception     Praxis     Communication     Cognition                                              Cueing      Exercises      Shoulder Instructions       General Comments      Pertinent Vitals/ Pain       Pain Assessment Pain Assessment: (P)  Faces Faces Pain Scale: (P) Hurts a little bit Pain Location: back, op site Pain Descriptors / Indicators: Sore, Aching Pain Intervention(s): Limited activity within patient's tolerance, Repositioned, Monitored during session  Home Living                                          Prior Functioning/Environment              Frequency  Min 2X/week        Progress Toward Goals  OT Goals(current goals can now be found in the care plan section)  Progress towards OT goals: Progressing toward goals     Plan      Co-evaluation                 AM-PAC OT "6 Clicks" Daily Activity     Outcome Measure   Help from another person eating meals?: None Help from another person taking care of personal grooming?: A Little Help from another person toileting, which includes using toliet, bedpan, or urinal?: A Little Help from another person bathing (including washing, rinsing, drying)?: A Lot Help from another person to put on and taking off regular upper body clothing?: A Little Help from another person to put on and taking off regular lower body clothing?: A Lot 6 Click Score: 17    End of Session Equipment Utilized During Treatment: Rolling walker (2 wheels)  OT Visit Diagnosis: Unsteadiness on feet (R26.81);Other abnormalities of gait and mobility (R26.89);Pain   Activity Tolerance Patient tolerated treatment well   Patient Left in bed;with call bell/phone within reach   Nurse Communication Other (comment) (secure chat pts. request for shower tom. before home)        Time: 1914-7829 OT Time Calculation (min): 42 min  Charges: OT General Charges $OT Visit: 1 Visit OT Treatments $Self Care/Home Management : 38-52 mins  Howell Macintosh, COTA/L Acute Rehabilitation [phone]   Leory Rands [place]-COTA/L  04/11/2024, 10:35 AM

## 2024-04-11 NOTE — Plan of Care (Signed)
  Problem: Pain Management: Goal: Pain level will decrease Outcome: Not Progressing   Problem: Clinical Measurements: Goal: Ability to maintain clinical measurements within normal limits will improve Outcome: Not Progressing Goal: Postoperative complications will be avoided or minimized Outcome: Not Progressing Goal: Diagnostic test results will improve Outcome: Not Progressing   Problem: Activity: Goal: Ability to avoid complications of mobility impairment will improve Outcome: Not Progressing Goal: Ability to tolerate increased activity will improve Outcome: Not Progressing Goal: Will remain free from falls Outcome: Not Progressing   Problem: Bowel/Gastric: Goal: Gastrointestinal status for postoperative course will improve Outcome: Not Progressing

## 2024-04-12 DIAGNOSIS — M4316 Spondylolisthesis, lumbar region: Secondary | ICD-10-CM | POA: Diagnosis not present

## 2024-04-12 MED ORDER — DIAZEPAM 5 MG PO TABS: 5.0000 mg | ORAL_TABLET | Freq: Four times a day (QID) | ORAL | 0 refills | Status: AC | PRN

## 2024-04-12 MED ORDER — OXYCODONE HCL 5 MG PO TABS: ORAL_TABLET | ORAL | 0 refills | Status: AC

## 2024-04-12 NOTE — Progress Notes (Signed)
 Reviewed AVS, patient expressed understanding of medications, MD follow up reviewed.   Patient states all belongings brought to the hospital at time of admission are accounted for and packed to take home.  Patient informed and expressed understanding on where to pick up medications. Pt transported to entrance A where family member was waiting in vehicle to transport home.

## 2024-04-12 NOTE — Plan of Care (Signed)
  Problem: Activity: Goal: Risk for activity intolerance will decrease Outcome: Progressing   Problem: Pain Managment: Goal: General experience of comfort will improve and/or be controlled Outcome: Progressing   Problem: Safety: Goal: Ability to remain free from injury will improve Outcome: Progressing   Problem: Skin Integrity: Goal: Risk for impaired skin integrity will decrease Outcome: Progressing   Problem: Education: Goal: Knowledge of the prescribed therapeutic regimen will improve Outcome: Progressing

## 2024-04-12 NOTE — Plan of Care (Signed)
  Problem: Clinical Measurements: Goal: Ability to maintain clinical measurements within normal limits will improve Outcome: Progressing   Problem: Activity: Goal: Risk for activity intolerance will decrease Outcome: Progressing   Problem: Pain Managment: Goal: General experience of comfort will improve and/or be controlled Outcome: Progressing   Problem: Safety: Goal: Ability to remain free from injury will improve Outcome: Progressing

## 2024-04-12 NOTE — Discharge Summary (Addendum)
 Physician Discharge Summary  Patient ID: Mary Holt MRN: [id number] DOB/AGE: 02-14-1961 63 y.o.  Admit date: 04/08/2024 Discharge date: 04/12/2024  Admission Diagnoses: Lumbar spinal stenosis L3-L4 with neurogenic claudication, lumbar radiculopathy  Discharge Diagnoses: Lumbar spinal stenosis L3-L4 with neurogenic claudication, lumbar radiculopathy. Principal Problem:   Lumbar stenosis with neurogenic claudication   Discharged Condition: good  Hospital Course: Patient was admitted to undergo surgical decompression at L3-L4 which she tolerated well.  Consults: None  Significant Diagnostic Studies: None  Treatments: surgery: See op note  Discharge Exam: Blood pressure (!) 141/75, pulse (!) 104, temperature 98.2 F (36.8 C), temperature source Oral, resp. rate 18, height 5\' 3"  (1.6 m), weight 74.4 kg, last menstrual period 09/30/2014, SpO2 100%. Incision is clean and dry Station and gait are intact.  Disposition: Discharge disposition: 01-Home or Self Care       Discharge Instructions     Ambulatory referral to Physical Therapy   Complete by: As directed    PT evaluate and treat post op laminectomy L3-4   Call MD for:  redness, tenderness, or signs of infection (pain, swelling, redness, odor or green/yellow discharge around incision site)   Complete by: As directed    Call MD for:  severe uncontrolled pain   Complete by: As directed    Call MD for:  temperature >100.4   Complete by: As directed    Diet - low sodium heart healthy   Complete by: As directed    Increase activity slowly   Complete by: As directed       Allergies as of 04/12/2024       Reactions   Relafen [nabumetone] Rash   Penicillins Rash        Medication List     TAKE these medications    aspirin  EC 81 MG tablet Take 1 tablet (81 mg total) by mouth daily.   blood glucose meter kit and supplies Kit Dispense based on patient and insurance preference. Use up to four times daily as  directed.   Blood Glucose Monitoring Suppl Devi 1 each by Does not apply route in the morning, at noon, and at bedtime. May substitute to any manufacturer covered by patient's insurance.   diazepam  5 MG tablet Commonly known as: VALIUM  Take 1 tablet (5 mg total) by mouth every 6 (six) hours as needed for muscle spasms.   fenofibrate  145 MG tablet Commonly known as: TRICOR  TAKE 1 TABLET(145 MG) BY MOUTH DAILY   Jardiance  25 MG Tabs tablet Generic drug: empagliflozin  TAKE 1 TABLET(25 MG) BY MOUTH DAILY BEFORE BREAKFAST   nitroGLYCERIN  0.4 MG SL tablet Commonly known as: NITROSTAT  Place 1 tablet (0.4 mg total) under the tongue every 5 (five) minutes as needed for chest pain.   olmesartan  40 MG tablet Commonly known as: BENICAR  Take 1 tablet (40 mg total) by mouth daily.   oxyCODONE  5 MG immediate release tablet Commonly known as: Oxy IR/ROXICODONE  1 or 2 tablets every 4 hours as needed for severe pain   oxyCODONE -acetaminophen  5-325 MG tablet Commonly known as: PERCOCET/ROXICET Take 1 tablet by mouth every 8 (eight) hours as needed for up to 9 doses for severe pain (pain score 7-10).   Ozempic  (0.25 or 0.5 MG/DOSE) 2 MG/1.5ML Sopn Generic drug: Semaglutide (0.25 or 0.5MG /DOS) Inject 0.5 mg into the skin once a week.   rosuvastatin  40 MG tablet Commonly known as: CRESTOR  TAKE 1 TABLET BY MOUTH  DAILY AT 6 PM   tiZANidine  4 MG tablet Commonly known as: Zanaflex   Take 0.5 tablets (2 mg total) by mouth every 8 (eight) hours as needed for up to 10 doses for muscle spasms.        Follow-up Information     Audie Bleacher, MD Follow up.   Specialty: Neurosurgery Why: Has a Made appt. already for follow up Contact information: [address] [place] [address] [phone]         [facility] Follow up.   Specialty: Rehabilitation Why: THey will call to schedule outpaitent PT Contact information: [address] [place]  [address] [phone]                Signed: Sela Daft 04/12/2024, 12:54 PM

## 2024-04-27 ENCOUNTER — Ambulatory Visit: Payer: PRIVATE HEALTH INSURANCE | Attending: Neurosurgery

## 2024-04-27 DIAGNOSIS — R262 Difficulty in walking, not elsewhere classified: Secondary | ICD-10-CM | POA: Insufficient documentation

## 2024-04-27 DIAGNOSIS — R293 Abnormal posture: Secondary | ICD-10-CM | POA: Insufficient documentation

## 2024-04-27 DIAGNOSIS — R2681 Unsteadiness on feet: Secondary | ICD-10-CM | POA: Insufficient documentation

## 2024-04-27 DIAGNOSIS — R2689 Other abnormalities of gait and mobility: Secondary | ICD-10-CM | POA: Insufficient documentation

## 2024-04-27 DIAGNOSIS — M6281 Muscle weakness (generalized): Secondary | ICD-10-CM | POA: Insufficient documentation

## 2024-04-27 DIAGNOSIS — Z9889 Other specified postprocedural states: Secondary | ICD-10-CM | POA: Insufficient documentation

## 2024-04-27 DIAGNOSIS — M48062 Spinal stenosis, lumbar region with neurogenic claudication: Secondary | ICD-10-CM | POA: Diagnosis not present

## 2024-04-27 DIAGNOSIS — M5459 Other low back pain: Secondary | ICD-10-CM | POA: Diagnosis present

## 2024-04-27 NOTE — Therapy (Signed)
 OUTPATIENT PHYSICAL THERAPY NEURO EVALUATION   Patient Name: Mary Holt MRN: [id number] DOB:1961-07-24, 63 y.o., female Today's Date: 04/27/2024   PCP: Susanna Epley, FNP REFERRING PROVIDER: Audie Bleacher, MD  END OF SESSION:  PT End of Session - 04/27/24 1406     Visit Number 1    Number of Visits 5    Date for PT Re-Evaluation 05/29/24    Authorization Type [employer]    PT Start Time 1404    PT Stop Time 1442    PT Time Calculation (min) 38 min    Equipment Utilized During Treatment Gait belt    Activity Tolerance Patient tolerated treatment well    Behavior During Therapy WFL for tasks assessed/performed             Past Medical History:  Diagnosis Date   Angina pectoris (HCC) 02/22/2020   CAD in native artery 02/22/2020   Chest pain of uncertain etiology 01/06/2020   Diabetes mellitus without complication (HCC)    Heart murmur    asymptomatic   Hyperlipidemia    Hypertension    Kidney stones    Kidney stones    several times, last  one 2014   Mitral valve prolapse    MVP (mitral valve prolapse)    Pure hypercholesterolemia 05/02/2020   Snoring 01/06/2020   Past Surgical History:  Procedure Laterality Date   BACK SURGERY     x2   BREAST SURGERY     cyst right breast   CARDIAC CATHETERIZATION     CARPAL TUNNEL RELEASE Bilateral    CHOLECYSTECTOMY     CORONARY STENT INTERVENTION N/A 02/26/2020   Procedure: CORONARY STENT INTERVENTION;  Surgeon: Arleen Lacer, MD;  Location: [facility];  Service: Cardiovascular;  Laterality: N/A;   DILATATION & CURRETTAGE/HYSTEROSCOPY WITH RESECTOCOPE N/A 08/11/2013   Procedure: DILATATION & CURETTAGE/HYSTEROSCOPY WITH RESECTOCOPE;  Surgeon: Kandra Orn, MD;  Location: [facility];  Service: Gynecology;  Laterality: N/A;   LEFT HEART CATH AND CORONARY ANGIOGRAPHY N/A 02/26/2020   Procedure: LEFT HEART CATH AND CORONARY ANGIOGRAPHY;  Surgeon: Arleen Lacer, MD;  Location: [facility] INVASIVE CV LAB;  Service:  Cardiovascular;  Laterality: N/A;   LEFT HEART CATH AND CORONARY ANGIOGRAPHY N/A 11/25/2023   Procedure: LEFT HEART CATH AND CORONARY ANGIOGRAPHY;  Surgeon: [place], Peter M, MD;  Location: [facility] INVASIVE CV LAB;  Service: Cardiovascular;  Laterality: N/A;   LUMBAR LAMINECTOMY/DECOMPRESSION MICRODISCECTOMY N/A 04/08/2024   Procedure: LUMBAR LAMINECTOMY/DECOMPRESSION MICRODISCECTOMY LUMBAR THREE-FOUR;  Surgeon: Audie Bleacher, MD;  Location: [facility] OR;  Service: Neurosurgery;  Laterality: N/A;   TONSILLECTOMY     TRIGGER FINGER RELEASE     left thumb   TUBAL LIGATION     Patient Active Problem List   Diagnosis Date Noted   Lumbar stenosis with neurogenic claudication 04/08/2024   Diabetes mellitus (HCC) 02/21/2024   COVID-19 vaccination declined 02/21/2024   Class 1 obesity due to excess calories with serious comorbidity and body mass index (BMI) of 30.0 to 30.9 in adult 02/21/2024   Sleep apnea, unspecified 07/27/2021   Pure hypercholesterolemia 05/02/2020   Abnormal cardiac CT angiography 02/26/2020   Angina pectoris (HCC) 02/22/2020   Coronary artery disease involving native coronary artery of native heart with angina pectoris (HCC) 02/22/2020   Chest pain of uncertain etiology 01/06/2020   Snoring 01/06/2020   Mitral valve prolapse 01/06/2020   History of mitral valve prolapse 12/07/2019   Rash 02/02/2019   Loss of hair 02/02/2019   Unilateral primary  osteoarthritis, left knee 10/28/2017   Chronic pain of left knee 09/30/2017   BMI 31.0-31.9,adult 10/02/2014   Hypertensive heart disease without heart failure 10/02/2014    ONSET DATE: 04/10/24 referral  REFERRING DIAG:  V56.433 (ICD-10-CM) - Lumbar stenosis with neurogenic claudication  Z98.890 (ICD-10-CM) - S/P lumbar laminectomy    THERAPY DIAG:  Abnormal posture - Plan: PT plan of care cert/re-cert  Difficulty in walking, not elsewhere classified - Plan: PT plan of care cert/re-cert  Muscle weakness (generalized) - Plan: PT  plan of care cert/re-cert  Rationale for Evaluation and Treatment: Rehabilitation  SUBJECTIVE:                                                                                                                                                                                             SUBJECTIVE STATEMENT: Patient arrives to clinic alone, drove herself. Not using AD. Has had multiple back surgeries. She does have a walker from a previous surgery, but has not been using it.  Pt accompanied by: self  PERTINENT HISTORY: angina, CAD, DM, heart murmur, HLD, HTN, kidney stones, MVP, h/o multiple back sx  PAIN:  Are you having pain? Yes: NPRS scale: 5/10 Pain location: area of sx Pain description: tender Aggravating factors: staying in 1 position for too long  Relieving factors: not able to recall  PRECAUTIONS: Back and Fall   WEIGHT BEARING RESTRICTIONS: No  FALLS: Has patient fallen in last 6 months? No  LIVING ENVIRONMENT: Lives with: lives alone Lives in: House/apartment Stairs: Yes: External: 4 steps; can reach both Has following equipment at home: Otho Blitz - 2 wheeled and Tour manager  PLOF: Independent  PATIENT GOALS: "to strengthen my muscles"  OBJECTIVE:  Note: Objective measures were completed at Evaluation unless otherwise noted.  DIAGNOSTIC FINDINGS: 01/19/24 CT lumbar  IMPRESSION: 1. No fracture or acute findings. 2. Intact L4-L5 posterior fusion hardware. 3. Multilevel degenerative change with spinal canal and neural foraminal stenosis as described. 4. Bilateral intrarenal calculi. Aortic Atherosclerosis (ICD10-I70.0).  COGNITION: Overall cognitive status: Within functional limits for tasks assessed   SENSATION: WFL   POSTURE: rounded shoulders and forward head   BED MOBILITY:  Independent using log roll method  TRANSFERS: Sit to stand: Complete Independence  Assistive device utilized: None     Stand to sit: Complete Independence  Assistive device  utilized: None      GAIT: Findings: Gait Characteristics: step through pattern, decreased arm swing- Right, decreased arm swing- Left, knee flexed in stance- Right, knee flexed in stance- Left, trunk flexed, and narrow BOS, Distance walked: clinic, Assistive device utilized:None, and Level of assistance: Complete Independence  FUNCTIONAL TESTS:   [facility] PT Assessment - 04/27/24 0001       Standardized Balance Assessment   Standardized Balance Assessment Timed Up and Go Test    Five times sit to stand comments  16.03    10 Meter Walk .32m/s      Timed Up and Go Test   Normal TUG (seconds) 14.3              PATIENT SURVEYS:  Modified Oswestry 23                                                                                                                               TREATMENT  Self care/home management: -initial HEP (see below) -review of back precautions   PATIENT EDUCATION: Education details: PT POC, exam findings, initial HEP Person educated: Patient Education method: Explanation, Demonstration, and Handouts Education comprehension: verbalized understanding and needs further education  HOME EXERCISE PROGRAM: Access Code: [email or web address] URL: [email or web address] Date: 04/27/2024 Prepared by: Nickola Baron  Exercises - Clamshell  - 1 x daily - 7 x weekly - 3 sets - 10 reps - Supine March  - 1 x daily - 7 x weekly - 3 sets - 10 reps - Sit to Stand  - 1 x daily - 7 x weekly - 3 sets - 10 reps - Heel Raises with Counter Support  - 1 x daily - 7 x weekly - 3 sets - 10 reps - Standing Hip Extension with Counter Support  - 1 x daily - 7 x weekly - 3 sets - 10 reps - Standing March with Counter Support  - 1 x daily - 7 x weekly - 3 sets - 10 reps  GOALS: Goals reviewed with patient? Yes  SHORT TERM GOALS: = LTG based on PT POC length   LONG TERM GOALS: Target date: 05/29/24  Pt will be independent with final HEP for improved functional strength   Baseline: to be updated Goal status: INITIAL  2.  Patient will improve Oswestry score to </=16 to demonstrate a reduction in her disability due to back pain Baseline: 23 Goal status: INITIAL  ASSESSMENT:  CLINICAL IMPRESSION: Patient is a 63 y.o. female who was seen today for physical therapy evaluation and treatment for impaired mobility s/p L3-L4 laminectomy. She has had multiple previous back surgeries. She scored a 23 on the Oswestry, suggesting a moderate level of disability due to back pain. She does live alone, which complicates her ability to maintain her back precautions while caring for herself, but reports being compliant. Her gait pattern (crouched) is indicative of both pain guarding as well as proximal weakness. She would benefit from skilled PT services to address the above mentioned deficits.    OBJECTIVE IMPAIRMENTS: Abnormal gait, decreased knowledge of condition, decreased knowledge of use of DME, decreased mobility, difficulty walking, decreased strength, improper body mechanics, postural dysfunction, and pain.   ACTIVITY LIMITATIONS: carrying, lifting, bending, sitting, standing, bed mobility, hygiene/grooming,  locomotion level, and caring for others  PARTICIPATION LIMITATIONS: meal prep, cleaning, interpersonal relationship, driving, shopping, community activity, and occupation  PERSONAL FACTORS: Age, Past/current experiences, Profession, and Social background are also affecting patient's functional outcome.   REHAB POTENTIAL: Good  CLINICAL DECISION MAKING: Stable/uncomplicated  EVALUATION COMPLEXITY: Low  PLAN:  PT FREQUENCY: 1x/week  PT DURATION: 4 weeks  PLANNED INTERVENTIONS: 97164- PT Re-evaluation, 97750- Physical Performance Testing, 97110-Therapeutic exercises, 97530- Therapeutic activity, 97112- Neuromuscular re-education, 97535- Self Care, 16109- Manual therapy, [phone]- Gait training, [phone]- Aquatic Therapy, [phone]- Electrical stimulation (manual),  Patient/Family education, Balance training, Stair training, Taping, Scar mobilization, Vestibular training, Visual/preceptual remediation/compensation, DME instructions, Cryotherapy, and Moist heat  PLAN FOR NEXT SESSION: review log roll, core and proximal strengthening    Rebecca Campus, PT Rebecca Campus, PT, DPT, CBIS  04/27/2024, 2:53 PM

## 2024-05-05 ENCOUNTER — Ambulatory Visit: Payer: PRIVATE HEALTH INSURANCE | Admitting: Physical Therapy

## 2024-05-05 DIAGNOSIS — R2689 Other abnormalities of gait and mobility: Secondary | ICD-10-CM

## 2024-05-05 DIAGNOSIS — R2681 Unsteadiness on feet: Secondary | ICD-10-CM

## 2024-05-05 DIAGNOSIS — M6281 Muscle weakness (generalized): Secondary | ICD-10-CM

## 2024-05-05 DIAGNOSIS — R293 Abnormal posture: Secondary | ICD-10-CM | POA: Diagnosis not present

## 2024-05-05 NOTE — Therapy (Signed)
 OUTPATIENT PHYSICAL THERAPY NEURO TREATMENT   Patient Name: AMAIRANI MICCIO MRN: [id number] DOB:[birth year], 63 y.o., female Today's Date: 05/05/2024   PCP: Susanna Epley, FNP REFERRING PROVIDER: Audie Bleacher, MD  END OF SESSION:  PT End of Session - 05/05/24 1059     Visit Number 2    Number of Visits 5    Date for PT Re-Evaluation 05/29/24    Authorization Type [employer]    PT Start Time 1057    PT Stop Time 1139    PT Time Calculation (min) 42 min    Equipment Utilized During Treatment --    Activity Tolerance Patient tolerated treatment well    Behavior During Therapy [facility] for tasks assessed/performed              Past Medical History:  Diagnosis Date   Angina pectoris (HCC) 02/22/2020   CAD in native artery 02/22/2020   Chest pain of uncertain etiology 01/06/2020   Diabetes mellitus without complication (HCC)    Heart murmur    asymptomatic   Hyperlipidemia    Hypertension    Kidney stones    Kidney stones    several times, last  one 2014   Mitral valve prolapse    MVP (mitral valve prolapse)    Pure hypercholesterolemia 05/02/2020   Snoring 01/06/2020   Past Surgical History:  Procedure Laterality Date   BACK SURGERY     x2   BREAST SURGERY     cyst right breast   CARDIAC CATHETERIZATION     CARPAL TUNNEL RELEASE Bilateral    CHOLECYSTECTOMY     CORONARY STENT INTERVENTION N/A 02/26/2020   Procedure: CORONARY STENT INTERVENTION;  Surgeon: Arleen Lacer, MD;  Location: [facility];  Service: Cardiovascular;  Laterality: N/A;   DILATATION & CURRETTAGE/HYSTEROSCOPY WITH RESECTOCOPE N/A 08/11/2013   Procedure: DILATATION & CURETTAGE/HYSTEROSCOPY WITH RESECTOCOPE;  Surgeon: Kandra Orn, MD;  Location: [facility];  Service: Gynecology;  Laterality: N/A;   LEFT HEART CATH AND CORONARY ANGIOGRAPHY N/A 02/26/2020   Procedure: LEFT HEART CATH AND CORONARY ANGIOGRAPHY;  Surgeon: Arleen Lacer, MD;  Location: [facility] INVASIVE CV LAB;  Service:  Cardiovascular;  Laterality: N/A;   LEFT HEART CATH AND CORONARY ANGIOGRAPHY N/A 11/25/2023   Procedure: LEFT HEART CATH AND CORONARY ANGIOGRAPHY;  Surgeon: [place], Peter M, MD;  Location: [facility] INVASIVE CV LAB;  Service: Cardiovascular;  Laterality: N/A;   LUMBAR LAMINECTOMY/DECOMPRESSION MICRODISCECTOMY N/A 04/08/2024   Procedure: LUMBAR LAMINECTOMY/DECOMPRESSION MICRODISCECTOMY LUMBAR THREE-FOUR;  Surgeon: Audie Bleacher, MD;  Location: [facility] OR;  Service: Neurosurgery;  Laterality: N/A;   TONSILLECTOMY     TRIGGER FINGER RELEASE     left thumb   TUBAL LIGATION     Patient Active Problem List   Diagnosis Date Noted   Lumbar stenosis with neurogenic claudication 04/08/2024   Diabetes mellitus (HCC) 02/21/2024   COVID-19 vaccination declined 02/21/2024   Class 1 obesity due to excess calories with serious comorbidity and body mass index (BMI) of 30.0 to 30.9 in adult 02/21/2024   Sleep apnea, unspecified 07/27/2021   Pure hypercholesterolemia 05/02/2020   Abnormal cardiac CT angiography 02/26/2020   Angina pectoris (HCC) 02/22/2020   Coronary artery disease involving native coronary artery of native heart with angina pectoris (HCC) 02/22/2020   Chest pain of uncertain etiology 01/06/2020   Snoring 01/06/2020   Mitral valve prolapse 01/06/2020   History of mitral valve prolapse 12/07/2019   Rash 02/02/2019   Loss of hair 02/02/2019   Unilateral primary  osteoarthritis, left knee 10/28/2017   Chronic pain of left knee 09/30/2017   BMI 31.0-31.9,adult 10/02/2014   Hypertensive heart disease without heart failure 10/02/2014    ONSET DATE: 04/10/24 referral  REFERRING DIAG:  Z61.096 (ICD-10-CM) - Lumbar stenosis with neurogenic claudication  Z98.890 (ICD-10-CM) - S/P lumbar laminectomy    THERAPY DIAG:  Muscle weakness (generalized)  Unsteadiness on feet  Other abnormalities of gait and mobility  Rationale for Evaluation and Treatment: Rehabilitation  SUBJECTIVE:                                                                                                                                                                                              SUBJECTIVE STATEMENT: Patient reports doing well. States she has been doing her HEP. Has not been taking pain medication.   Pt accompanied by: self  PERTINENT HISTORY: angina, CAD, DM, heart murmur, HLD, HTN, kidney stones, MVP, h/o multiple back sx  PAIN:  Are you having pain? Yes: NPRS scale: 5-6/10 Pain location: area of sx Pain description: tender Aggravating factors: staying in 1 position for too long  Relieving factors: not able to recall  PRECAUTIONS: Back and Fall   WEIGHT BEARING RESTRICTIONS: No  FALLS: Has patient fallen in last 6 months? No  LIVING ENVIRONMENT: Lives with: lives alone Lives in: House/apartment Stairs: Yes: External: 4 steps; can reach both Has following equipment at home: Otho Blitz - 2 wheeled and Tour manager  PLOF: Independent  PATIENT GOALS: "to strengthen my muscles"  OBJECTIVE:  Note: Objective measures were completed at Evaluation unless otherwise noted.  DIAGNOSTIC FINDINGS: 01/19/24 CT lumbar  IMPRESSION: 1. No fracture or acute findings. 2. Intact L4-L5 posterior fusion hardware. 3. Multilevel degenerative change with spinal canal and neural foraminal stenosis as described. 4. Bilateral intrarenal calculi. Aortic Atherosclerosis (ICD10-I70.0).  COGNITION: Overall cognitive status: Within functional limits for tasks assessed   SENSATION: WFL   POSTURE: rounded shoulders and forward head   BED MOBILITY:  Independent using log roll method  TRANSFERS: Sit to stand: Complete Independence  Assistive device utilized: None     Stand to sit: Complete Independence  Assistive device utilized: None      GAIT: Findings: Gait Characteristics: step through pattern, decreased arm swing- Right, decreased arm swing- Left, knee flexed in stance- Right, knee flexed in  stance- Left, trunk flexed, and narrow BOS, Distance walked: clinic, Assistive device utilized:None, and Level of assistance: Complete Independence   PATIENT SURVEYS:  Modified Oswestry 23  TREATMENT: Ther Act  SciFit multi-peaks level 5 for 8 minutes using BUE/BLEs for dynamic cardiovascular warmup and gentle pelvic mobility.  Reviewed log roll technique and pt demonstrated well w/no cues needed for technique. Informed pt she can place a pillow between knees in sidelying to relieve some pressure on low back if needed. Pt verbalized understanding  The following were performed for improved functional core stability and posterior chain strength: Seated march overs using 12# KB, x15 reps per side. Min cues to maintain knee flexion throughout.  Progressed to holding 2# dowel in 90 degrees of shoulder flexion and performing x10 reps per side.  Quadruped bird dogs, x8 reps per side w/CGA. Mod verbal cues for proper TA activation throughout.   Standing marches w/6# ball OH hold, x10 reps per side. Pt w/improved core stability in this position  Squats touches to mat w/6# med ball, x15 reps. Min cues to facilitate hip hinge and pt able to perform well     PATIENT EDUCATION: Education details: Continue HEP, discussed extending PT POC to work on lifting once her precautions are uplifted as pt is nervous about returning to work due to physical requirements   Person educated: Patient Education method: Medical illustrator Education comprehension: verbalized understanding and needs further education  HOME EXERCISE PROGRAM: Access Code: [email or web address] URL: [email or web address] Date: 04/27/2024 Prepared by: Nickola Baron  Exercises - Clamshell  - 1 x daily - 7 x weekly - 3 sets - 10 reps - Supine March  - 1 x daily - 7 x weekly - 3 sets - 10 reps - Sit to  Stand  - 1 x daily - 7 x weekly - 3 sets - 10 reps - Heel Raises with Counter Support  - 1 x daily - 7 x weekly - 3 sets - 10 reps - Standing Hip Extension with Counter Support  - 1 x daily - 7 x weekly - 3 sets - 10 reps - Standing March with Counter Support  - 1 x daily - 7 x weekly - 3 sets - 10 reps  GOALS: Goals reviewed with patient? Yes  SHORT TERM GOALS: = LTG based on PT POC length   LONG TERM GOALS: Target date: 05/29/24  Pt will be independent with final HEP for improved functional strength  Baseline: to be updated Goal status: INITIAL  2.  Patient will improve Oswestry score to </=16 to demonstrate a reduction in her disability due to back pain Baseline: 23 Goal status: INITIAL  ASSESSMENT:  CLINICAL IMPRESSION: Emphasis of skilled PT session on posterior chain strength and functional core stability. Pt moves w/very guarded pattern, maintaining rigidity in hips and shoulders. Pt tolerated session well w/no increase in pain reported but required verbal cues to facilitate minimal hip flexion w/transfers and relax arms w/exercises. Lifting is required for pt's job and she is worried about this, so discussed adding more appointments in June to work on this once she is cleared from back precautions. Continue POC.   OBJECTIVE IMPAIRMENTS: Abnormal gait, decreased knowledge of condition, decreased knowledge of use of DME, decreased mobility, difficulty walking, decreased strength, improper body mechanics, postural dysfunction, and pain.   ACTIVITY LIMITATIONS: carrying, lifting, bending, sitting, standing, bed mobility, hygiene/grooming, locomotion level, and caring for others  PARTICIPATION LIMITATIONS: meal prep, cleaning, interpersonal relationship, driving, shopping, community activity, and occupation  PERSONAL FACTORS: Age, Past/current experiences, Profession, and Social background are also affecting patient's functional outcome.   REHAB POTENTIAL: Good  CLINICAL DECISION  MAKING: Stable/uncomplicated  EVALUATION  COMPLEXITY: Low  PLAN:  PT FREQUENCY: 1x/week  PT DURATION: 4 weeks  PLANNED INTERVENTIONS: 97164- PT Re-evaluation, 97750- Physical Performance Testing, 97110-Therapeutic exercises, 97530- Therapeutic activity, [id number]- Neuromuscular re-education, 97535- Self Care, 16109- Manual therapy, [phone]- Gait training, [phone]- Aquatic Therapy, [phone]- Electrical stimulation (manual), Patient/Family education, Balance training, Stair training, Taping, Scar mobilization, Vestibular training, Visual/preceptual remediation/compensation, DME instructions, Cryotherapy, and Moist heat  PLAN FOR NEXT SESSION: Functional strengthening, single leg stability, posterior chain strength, core stability, squats    Isa Hitz E Nickayla Mcinnis, PT, DPT  05/05/2024, 11:48 AM

## 2024-05-13 ENCOUNTER — Ambulatory Visit: Payer: PRIVATE HEALTH INSURANCE | Admitting: Physical Therapy

## 2024-05-13 DIAGNOSIS — M6281 Muscle weakness (generalized): Secondary | ICD-10-CM

## 2024-05-13 DIAGNOSIS — M5459 Other low back pain: Secondary | ICD-10-CM

## 2024-05-13 DIAGNOSIS — R293 Abnormal posture: Secondary | ICD-10-CM | POA: Diagnosis not present

## 2024-05-13 DIAGNOSIS — R2681 Unsteadiness on feet: Secondary | ICD-10-CM

## 2024-05-13 NOTE — Therapy (Signed)
 OUTPATIENT PHYSICAL THERAPY NEURO TREATMENT   Patient Name: Mary Holt MRN: [id number] DOB:06/16/1961, 63 y.o., female Today's Date: 05/13/2024   PCP: Susanna Epley, FNP REFERRING PROVIDER: Audie Bleacher, MD  END OF SESSION:  PT End of Session - 05/13/24 1107     Visit Number 3    Number of Visits 5    Date for PT Re-Evaluation 05/29/24    Authorization Type [employer]    PT Start Time 1105   Previous pt session ran late   PT Stop Time 1143    PT Time Calculation (min) 38 min    Activity Tolerance Patient tolerated treatment well    Behavior During Therapy [facility] for tasks assessed/performed              Past Medical History:  Diagnosis Date   Angina pectoris (HCC) 02/22/2020   CAD in native artery 02/22/2020   Chest pain of uncertain etiology 01/06/2020   Diabetes mellitus without complication (HCC)    Heart murmur    asymptomatic   Hyperlipidemia    Hypertension    Kidney stones    Kidney stones    several times, last  one 2014   Mitral valve prolapse    MVP (mitral valve prolapse)    Pure hypercholesterolemia 05/02/2020   Snoring 01/06/2020   Past Surgical History:  Procedure Laterality Date   BACK SURGERY     x2   BREAST SURGERY     cyst right breast   CARDIAC CATHETERIZATION     CARPAL TUNNEL RELEASE Bilateral    CHOLECYSTECTOMY     CORONARY STENT INTERVENTION N/A 02/26/2020   Procedure: CORONARY STENT INTERVENTION;  Surgeon: Arleen Lacer, MD;  Location: [facility] IN[facility];  Service: Cardiovascular;  Laterality: N/A;   DILATATION & CURRETTAGE/HYSTEROSCOPY WITH RESECTOCOPE N/A 08/11/2013   Procedure: DILATATION & CURETTAGE/HYSTEROSCOPY WITH RESECTOCOPE;  Surgeon: Kandra Orn, MD;  Location: [facility];  Service: Gynecology;  Laterality: N/A;   LEFT HEART CATH AND CORONARY ANGIOGRAPHY N/A 02/26/2020   Procedure: LEFT HEART CATH AND CORONARY ANGIOGRAPHY;  Surgeon: Arleen Lacer, MD;  Location: [facility] INVASIVE CV LAB;  Service: Cardiovascular;   Laterality: N/A;   LEFT HEART CATH AND CORONARY ANGIOGRAPHY N/A 11/25/2023   Procedure: LEFT HEART CATH AND CORONARY ANGIOGRAPHY;  Surgeon: Swazil[place], Peter M, MD;  Location: [facility] INVASIVE CV LAB;  Service: Cardiovascular;  Laterality: N/A;   LUMBAR LAMINECTOMY/DECOMPRESSION MICRODISCECTOMY N/A 04/08/2024   Procedure: LUMBAR LAMINECTOMY/DECOMPRESSION MICRODISCECTOMY LUMBAR THREE-FOUR;  Surgeon: Audie Bleacher, MD;  Location: [facility] OR;  Service: Neurosurgery;  Laterality: N/A;   TONSILLECTOMY     TRIGGER FINGER RELEASE     left thumb   TUBAL LIGATION     Patient Active Problem List   Diagnosis Date Noted   Lumbar stenosis with neurogenic claudication 04/08/2024   Diabetes mellitus (HCC) 02/21/2024   COVID-19 vaccination declined 02/21/2024   Class 1 obesity due to excess calories with serious comorbidity and body mass index (BMI) of 30.0 to 30.9 in adult 02/21/2024   Sleep apnea, unspecified 07/27/2021   Pure hypercholesterolemia 05/02/2020   Abnormal cardiac CT angiography 02/26/2020   Angina pectoris (HCC) 02/22/2020   Coronary artery disease involving native coronary artery of native heart with angina pectoris (HCC) 02/22/2020   Chest pain of uncertain etiology 01/06/2020   Snoring 01/06/2020   Mitral valve prolapse 01/06/2020   History of mitral valve prolapse 12/07/2019   Rash 02/02/2019   Loss of hair 02/02/2019   Unilateral primary osteoarthritis, left  knee 10/28/2017   Chronic pain of left knee 09/30/2017   BMI 31.0-31.9,adult 10/02/2014   Hypertensive heart disease without heart failure 10/02/2014    ONSET DATE: 04/10/24 referral  REFERRING DIAG:  Z61.096 (ICD-10-CM) - Lumbar stenosis with neurogenic claudication  Z98.890 (ICD-10-CM) - S/P lumbar laminectomy    THERAPY DIAG:  Muscle weakness (generalized)  Unsteadiness on feet  Abnormal posture  Other low back pain  Rationale for Evaluation and Treatment: Rehabilitation  SUBJECTIVE:                                                                                                                                                                                              SUBJECTIVE STATEMENT: Patient reports she started having more back pain today, visited her sister in the hospital yesterday and has been bothering her ever since. Has not been doing her HEP, mostly focused on walking   Pt accompanied by: self  PERTINENT HISTORY: angina, CAD, DM, heart murmur, HLD, HTN, kidney stones, MVP, h/o multiple back sx  PAIN:  Are you having pain? Yes: NPRS scale: 6/10 Pain location: area of sx Pain description: tender Aggravating factors: staying in 1 position for too long  Relieving factors: not able to recall  PRECAUTIONS: Back and Fall   WEIGHT BEARING RESTRICTIONS: No  FALLS: Has patient fallen in last 6 months? No  LIVING ENVIRONMENT: Lives with: lives alone Lives in: House/apartment Stairs: Yes: External: 4 steps; can reach both Has following equipment at home: Otho Blitz - 2 wheeled and Tour manager  PLOF: Independent  PATIENT GOALS: "to strengthen my muscles"  OBJECTIVE:  Note: Objective measures were completed at Evaluation unless otherwise noted.  DIAGNOSTIC FINDINGS: 01/19/24 CT lumbar  IMPRESSION: 1. No fracture or acute findings. 2. Intact L4-L5 posterior fusion hardware. 3. Multilevel degenerative change with spinal canal and neural foraminal stenosis as described. 4. Bilateral intrarenal calculi. Aortic Atherosclerosis (ICD10-I70.0).  COGNITION: Overall cognitive status: Within functional limits for tasks assessed   SENSATION: WFL   POSTURE: rounded shoulders and forward head   BED MOBILITY:  Independent using log roll method  TRANSFERS: Sit to stand: Complete Independence  Assistive device utilized: None     Stand to sit: Complete Independence  Assistive device utilized: None      GAIT: Findings: Gait Characteristics: step through pattern, decreased arm swing- Right,  decreased arm swing- Left, knee flexed in stance- Right, knee flexed in stance- Left, trunk flexed, and narrow BOS, Distance walked: clinic, Assistive device utilized:None, and Level of assistance: Complete Independence   PATIENT SURVEYS:  Modified Oswestry 23  TREATMENT: Ther Act  SciFit multi-peaks level 7.5 for 8 minutes using BUE/BLEs for dynamic cardiovascular warmup and gentle pelvic mobility. Pt rated pain as 6/10 following activity.  At ballet bar for improved core stability and functional hip strength: Single leg hip flex/abduction/extension w/green theraband anchored around distal quads, 2x10 reps per side w/BUE support. Increased difficulty on R side vs left  Mini squats w/green theraband around distal quads, x20 reps w/BUE support. Max cues to maintain weight on heels rather than toes.  Pallof presses w/orange resistance band, x15 reps per side  Pt performed floor transfer from mat table to floor mat mod I and performed the following for improved hip strength, single leg stability and core stability:  Alt tall to half kneel w/BUE support, x8 reps per side. Increased difficulty on RLE  In half kneel, single arm rows using red theraband, x12 reps per side. Min cues to avoid shoulder shrug w/movement.  In half kneel, chest presses w/2# dowel, x12 reps per side  Pt rated back pain as 5/10 following session    PATIENT EDUCATION: Education details: Continue HEP Person educated: Patient Education method: Medical illustrator Education comprehension: verbalized understanding and needs further education  HOME EXERCISE PROGRAM: Access Code: [email or web address] URL: [email or web address] Date: 04/27/2024 Prepared by: Nickola Baron  Exercises - Clamshell  - 1 x daily - 7 x weekly - 3 sets - 10 reps - Supine March  - 1 x daily - 7 x weekly - 3  sets - 10 reps - Sit to Stand  - 1 x daily - 7 x weekly - 3 sets - 10 reps - Heel Raises with Counter Support  - 1 x daily - 7 x weekly - 3 sets - 10 reps - Standing Hip Extension with Counter Support  - 1 x daily - 7 x weekly - 3 sets - 10 reps - Standing March with Counter Support  - 1 x daily - 7 x weekly - 3 sets - 10 reps  GOALS: Goals reviewed with patient? Yes  SHORT TERM GOALS: = LTG based on PT POC length   LONG TERM GOALS: Target date: 05/29/24  Pt will be independent with final HEP for improved functional strength  Baseline: to be updated Goal status: INITIAL  2.  Patient will improve Oswestry score to </=16 to demonstrate a reduction in her disability due to back pain Baseline: 23 Goal status: INITIAL  ASSESSMENT:  CLINICAL IMPRESSION: Emphasis of skilled PT session on functional hip and core stability for low back pain modulation. Pt tolerated session well w/no increase in pain and reported reduced pain by end of activity. Pt demonstrates increased weakness of RLE > LLE so encouraged pt to work on her hip exercises from her HEP as well as walking. Pt sees surgeon on 6/9 and is to ask about back precautions and plan to add more visits to POC. Continue POC.   OBJECTIVE IMPAIRMENTS: Abnormal gait, decreased knowledge of condition, decreased knowledge of use of DME, decreased mobility, difficulty walking, decreased strength, improper body mechanics, postural dysfunction, and pain.   ACTIVITY LIMITATIONS: carrying, lifting, bending, sitting, standing, bed mobility, hygiene/grooming, locomotion level, and caring for others  PARTICIPATION LIMITATIONS: meal prep, cleaning, interpersonal relationship, driving, shopping, community activity, and occupation  PERSONAL FACTORS: Age, Past/current experiences, Profession, and Social background are also affecting patient's functional outcome.   REHAB POTENTIAL: Good  CLINICAL DECISION MAKING: Stable/uncomplicated  EVALUATION  COMPLEXITY: Low  PLAN:  PT FREQUENCY: 1x/week  PT DURATION: 4 weeks  PLANNED INTERVENTIONS: 97164- PT Re-evaluation, 97750- Physical Performance Testing, 97110-Therapeutic exercises, 97530- Therapeutic activity, [id number]- Neuromuscular re-education, [phone]- Self Care, 09811- Manual therapy, [phone]- Gait training, [phone]- Aquatic Therapy, [phone]- Electrical stimulation (manual), Patient/Family education, Balance training, Stair training, Taping, Scar mobilization, Vestibular training, Visual/preceptual remediation/compensation, DME instructions, Cryotherapy, and Moist heat  PLAN FOR NEXT SESSION: Functional strengthening, single leg stability, posterior chain strength, core stability, squats    Katelinn Justice E Asheton Scheffler, PT, DPT  05/13/2024, 11:44 AM

## 2024-05-20 ENCOUNTER — Ambulatory Visit: Payer: PRIVATE HEALTH INSURANCE

## 2024-05-20 DIAGNOSIS — M6281 Muscle weakness (generalized): Secondary | ICD-10-CM

## 2024-05-20 DIAGNOSIS — R293 Abnormal posture: Secondary | ICD-10-CM | POA: Diagnosis not present

## 2024-05-20 DIAGNOSIS — M5459 Other low back pain: Secondary | ICD-10-CM

## 2024-05-20 DIAGNOSIS — R262 Difficulty in walking, not elsewhere classified: Secondary | ICD-10-CM

## 2024-05-20 DIAGNOSIS — R2681 Unsteadiness on feet: Secondary | ICD-10-CM

## 2024-05-20 DIAGNOSIS — R2689 Other abnormalities of gait and mobility: Secondary | ICD-10-CM

## 2024-05-20 NOTE — Therapy (Signed)
 OUTPATIENT PHYSICAL THERAPY NEURO TREATMENT   Patient Name: Mary Holt MRN: [id number] DOB:1961/03/09, 63 y.o., female Today's Date: 05/20/2024   PCP: Susanna Epley, FNP REFERRING PROVIDER: Audie Bleacher, MD  END OF SESSION:  PT End of Session - 05/20/24 1132     Visit Number 4    Number of Visits 5    Date for PT Re-Evaluation 05/29/24    Authorization Type [employer]    PT Start Time 1146    PT Stop Time 1228    PT Time Calculation (min) 42 min    Equipment Utilized During Treatment Gait belt    Activity Tolerance Patient tolerated treatment well    Behavior During Therapy WFL for tasks assessed/performed              Past Medical History:  Diagnosis Date   Angina pectoris (HCC) 02/22/2020   CAD in native artery 02/22/2020   Chest pain of uncertain etiology 01/06/2020   Diabetes mellitus without complication (HCC)    Heart murmur    asymptomatic   Hyperlipidemia    Hypertension    Kidney stones    Kidney stones    several times, last  one 2014   Mitral valve prolapse    MVP (mitral valve prolapse)    Pure hypercholesterolemia 05/02/2020   Snoring 01/06/2020   Past Surgical History:  Procedure Laterality Date   BACK SURGERY     x2   BREAST SURGERY     cyst right breast   CARDIAC CATHETERIZATION     CARPAL TUNNEL RELEASE Bilateral    CHOLECYSTECTOMY     CORONARY STENT INTERVENTION N/A 02/26/2020   Procedure: CORONARY STENT INTERVENTION;  Surgeon: Arleen Lacer, MD;  Location: [facility];  Service: Cardiovascular;  Laterality: N/A;   DILATATION & CURRETTAGE/HYSTEROSCOPY WITH RESECTOCOPE N/A 08/11/2013   Procedure: DILATATION & CURETTAGE/HYSTEROSCOPY WITH RESECTOCOPE;  Surgeon: Kandra Orn, MD;  Location: [facility];  Service: Gynecology;  Laterality: N/A;   LEFT HEART CATH AND CORONARY ANGIOGRAPHY N/A 02/26/2020   Procedure: LEFT HEART CATH AND CORONARY ANGIOGRAPHY;  Surgeon: Arleen Lacer, MD;  Location: [facility] INVASIVE CV LAB;  Service:  Cardiovascular;  Laterality: N/A;   LEFT HEART CATH AND CORONARY ANGIOGRAPHY N/A 11/25/2023   Procedure: LEFT HEART CATH AND CORONARY ANGIOGRAPHY;  Surgeon: Swazil[place], Peter M, MD;  Location: [facility] INVASIVE CV LAB;  Service: Cardiovascular;  Laterality: N/A;   LUMBAR LAMINECTOMY/DECOMPRESSION MICRODISCECTOMY N/A 04/08/2024   Procedure: LUMBAR LAMINECTOMY/DECOMPRESSION MICRODISCECTOMY LUMBAR THREE-FOUR;  Surgeon: Audie Bleacher, MD;  Location: [facility] OR;  Service: Neurosurgery;  Laterality: N/A;   TONSILLECTOMY     TRIGGER FINGER RELEASE     left thumb   TUBAL LIGATION     Patient Active Problem List   Diagnosis Date Noted   Lumbar stenosis with neurogenic claudication 04/08/2024   Diabetes mellitus (HCC) 02/21/2024   COVID-19 vaccination declined 02/21/2024   Class 1 obesity due to excess calories with serious comorbidity and body mass index (BMI) of 30.0 to 30.9 in adult 02/21/2024   Sleep apnea, unspecified 07/27/2021   Pure hypercholesterolemia 05/02/2020   Abnormal cardiac CT angiography 02/26/2020   Angina pectoris (HCC) 02/22/2020   Coronary artery disease involving native coronary artery of native heart with angina pectoris (HCC) 02/22/2020   Chest pain of uncertain etiology 01/06/2020   Snoring 01/06/2020   Mitral valve prolapse 01/06/2020   History of mitral valve prolapse 12/07/2019   Rash 02/02/2019   Loss of hair 02/02/2019   Unilateral  primary osteoarthritis, left knee 10/28/2017   Chronic pain of left knee 09/30/2017   BMI 31.0-31.9,adult 10/02/2014   Hypertensive heart disease without heart failure 10/02/2014    ONSET DATE: 04/10/24 referral  REFERRING DIAG:  Y78.295 (ICD-10-CM) - Lumbar stenosis with neurogenic claudication  Z98.890 (ICD-10-CM) - S/P lumbar laminectomy    THERAPY DIAG:  Muscle weakness (generalized)  Abnormal posture  Unsteadiness on feet  Other low back pain  Other abnormalities of gait and mobility  Difficulty in walking, not elsewhere  classified  Rationale for Evaluation and Treatment: Rehabilitation  SUBJECTIVE:                                                                                                                                                                                             SUBJECTIVE STATEMENT: Patient reports doing well. Was sore a little after last time, but felt it was a good work out. Denies falls.   Pt accompanied by: self  PERTINENT HISTORY: angina, CAD, DM, heart murmur, HLD, HTN, kidney stones, MVP, h/o multiple back sx  PAIN:  Are you having pain? Yes: NPRS scale: 4/10 Pain location: area of sx Pain description: tender Aggravating factors: staying in 1 position for too long  Relieving factors: not able to recall  PRECAUTIONS: Back and Fall   PATIENT GOALS: "to strengthen my muscles"  OBJECTIVE:  Note: Objective measures were completed at Evaluation unless otherwise noted.  DIAGNOSTIC FINDINGS: 01/19/24 CT lumbar  IMPRESSION: 1. No fracture or acute findings. 2. Intact L4-L5 posterior fusion hardware. 3. Multilevel degenerative change with spinal canal and neural foraminal stenosis as described. 4. Bilateral intrarenal calculi. Aortic Atherosclerosis (ICD10-I70.0).                                                                                                                              TREATMENT: Therex -scifit hills level 3x10 mins B UE /LE for CV priming -Heel raises - squat -> squat + heel raise -anterior lunge B LE  -3-way hip at ballet bar red theraband    PATIENT EDUCATION: Education details: Continue  HEP Person educated: Patient Education method: Medical illustrator Education comprehension: verbalized understanding and needs further education  HOME EXERCISE PROGRAM: Access Code: [email or web address] URL: [email or web address] Date: 04/27/2024 Prepared by: Nickola Baron  Exercises - Clamshell  - 1 x daily - 7 x weekly - 3 sets - 10  reps - Supine March  - 1 x daily - 7 x weekly - 3 sets - 10 reps - Sit to Stand  - 1 x daily - 7 x weekly - 3 sets - 10 reps - Heel Raises with Counter Support  - 1 x daily - 7 x weekly - 3 sets - 10 reps - Standing Hip Extension with Counter Support  - 1 x daily - 7 x weekly - 3 sets - 10 reps - Standing March with Counter Support  - 1 x daily - 7 x weekly - 3 sets - 10 reps  GOALS: Goals reviewed with patient? Yes  SHORT TERM GOALS: = LTG based on PT POC length   LONG TERM GOALS: Target date: 05/29/24  Pt will be independent with final HEP for improved functional strength  Baseline: to be updated Goal status: INITIAL  2.  Patient will improve Oswestry score to </=16 to demonstrate a reduction in her disability due to back pain Baseline: 23 Goal status: INITIAL  ASSESSMENT:  CLINICAL IMPRESSION: Patient seen for skilled PT session with emphasis on proximal LE strengthening. Goal to strengthen B hips to minimize stress on low back and minimize trendelenburg gait pattern. Continue POC.   OBJECTIVE IMPAIRMENTS: Abnormal gait, decreased knowledge of condition, decreased knowledge of use of DME, decreased mobility, difficulty walking, decreased strength, improper body mechanics, postural dysfunction, and pain.   ACTIVITY LIMITATIONS: carrying, lifting, bending, sitting, standing, bed mobility, hygiene/grooming, locomotion level, and caring for others  PARTICIPATION LIMITATIONS: meal prep, cleaning, interpersonal relationship, driving, shopping, community activity, and occupation  PERSONAL FACTORS: Age, Past/current experiences, Profession, and Social background are also affecting patient's functional outcome.   REHAB POTENTIAL: Good  CLINICAL DECISION MAKING: Stable/uncomplicated  EVALUATION COMPLEXITY: Low  PLAN:  PT FREQUENCY: 1x/week  PT DURATION: 4 weeks  PLANNED INTERVENTIONS: 97164- PT Re-evaluation, 97750- Physical Performance Testing, 97110-Therapeutic exercises,  97530- Therapeutic activity, [id number]- Neuromuscular re-education, 97535- Self Care, 78295- Manual therapy, [phone]- Gait training, [phone]- Aquatic Therapy, [phone]- Electrical stimulation (manual), Patient/Family education, Balance training, Stair training, Taping, Scar mobilization, Vestibular training, Visual/preceptual remediation/compensation, DME instructions, Cryotherapy, and Moist heat  PLAN FOR NEXT SESSION: Functional strengthening, single leg stability, posterior chain strength, core stability, squats    Rebecca Campus, PT Rebecca Campus, PT, DPT, CBIS   05/20/2024, 12:47 PM

## 2024-05-27 ENCOUNTER — Ambulatory Visit: Payer: PRIVATE HEALTH INSURANCE | Attending: Neurosurgery

## 2024-05-27 DIAGNOSIS — R293 Abnormal posture: Secondary | ICD-10-CM | POA: Insufficient documentation

## 2024-05-27 DIAGNOSIS — M5459 Other low back pain: Secondary | ICD-10-CM | POA: Insufficient documentation

## 2024-05-27 DIAGNOSIS — R2689 Other abnormalities of gait and mobility: Secondary | ICD-10-CM | POA: Diagnosis present

## 2024-05-27 DIAGNOSIS — R262 Difficulty in walking, not elsewhere classified: Secondary | ICD-10-CM | POA: Insufficient documentation

## 2024-05-27 DIAGNOSIS — M6281 Muscle weakness (generalized): Secondary | ICD-10-CM | POA: Insufficient documentation

## 2024-05-27 DIAGNOSIS — R2681 Unsteadiness on feet: Secondary | ICD-10-CM | POA: Insufficient documentation

## 2024-05-27 NOTE — Therapy (Signed)
 OUTPATIENT PHYSICAL THERAPY NEURO TREATMENT   Patient Name: Mary Holt MRN: [id number] DOB:1961/03/19, 63 y.o., female Today's Date: 05/27/2024   PCP: Susanna Epley, FNP REFERRING PROVIDER: Audie Bleacher, MD  END OF SESSION:  PT End of Session - 05/27/24 1050     Visit Number 5    Number of Visits 5    Date for PT Re-Evaluation 05/29/24    Authorization Type [employer]    PT Start Time 1059    PT Stop Time 1143    PT Time Calculation (min) 44 min    Equipment Utilized During Treatment Gait belt    Activity Tolerance Patient tolerated treatment well    Behavior During Therapy WFL for tasks assessed/performed              Past Medical History:  Diagnosis Date   Angina pectoris (HCC) 02/22/2020   CAD in native artery 02/22/2020   Chest pain of uncertain etiology 01/06/2020   Diabetes mellitus without complication (HCC)    Heart murmur    asymptomatic   Hyperlipidemia    Hypertension    Kidney stones    Kidney stones    several times, last  one 2014   Mitral valve prolapse    MVP (mitral valve prolapse)    Pure hypercholesterolemia 05/02/2020   Snoring 01/06/2020   Past Surgical History:  Procedure Laterality Date   BACK SURGERY     x2   BREAST SURGERY     cyst right breast   CARDIAC CATHETERIZATION     CARPAL TUNNEL RELEASE Bilateral    CHOLECYSTECTOMY     CORONARY STENT INTERVENTION N/A 02/26/2020   Procedure: CORONARY STENT INTERVENTION;  Surgeon: Arleen Lacer, MD;  Location: [facility] INVASIVE CV LAB;  Service: Cardiovascular;  Laterality: N/A;   DILATATION & CURRETTAGE/HYSTEROSCOPY WITH RESECTOCOPE N/A 08/11/2013   Procedure: DILATATION & CURETTAGE/HYSTEROSCOPY WITH RESECTOCOPE;  Surgeon: Kandra Orn, MD;  Location: [facility];  Service: Gynecology;  Laterality: N/A;   LEFT HEART CATH AND CORONARY ANGIOGRAPHY N/A 02/26/2020   Procedure: LEFT HEART CATH AND CORONARY ANGIOGRAPHY;  Surgeon: Arleen Lacer, MD;  Location: [facility] INVASIVE CV LAB;  Service:  Cardiovascular;  Laterality: N/A;   LEFT HEART CATH AND CORONARY ANGIOGRAPHY N/A 11/25/2023   Procedure: LEFT HEART CATH AND CORONARY ANGIOGRAPHY;  Surgeon: [place], Peter M, MD;  Location: [facility] INVASIVE CV LAB;  Service: Cardiovascular;  Laterality: N/A;   LUMBAR LAMINECTOMY/DECOMPRESSION MICRODISCECTOMY N/A 04/08/2024   Procedure: LUMBAR LAMINECTOMY/DECOMPRESSION MICRODISCECTOMY LUMBAR THREE-FOUR;  Surgeon: Audie Bleacher, MD;  Location: [facility] OR;  Service: Neurosurgery;  Laterality: N/A;   TONSILLECTOMY     TRIGGER FINGER RELEASE     left thumb   TUBAL LIGATION     Patient Active Problem List   Diagnosis Date Noted   Lumbar stenosis with neurogenic claudication 04/08/2024   Diabetes mellitus (HCC) 02/21/2024   COVID-19 vaccination declined 02/21/2024   Class 1 obesity due to excess calories with serious comorbidity and body mass index (BMI) of 30.0 to 30.9 in adult 02/21/2024   Sleep apnea, unspecified 07/27/2021   Pure hypercholesterolemia 05/02/2020   Abnormal cardiac CT angiography 02/26/2020   Angina pectoris (HCC) 02/22/2020   Coronary artery disease involving native coronary artery of native heart with angina pectoris (HCC) 02/22/2020   Chest pain of uncertain etiology 01/06/2020   Snoring 01/06/2020   Mitral valve prolapse 01/06/2020   History of mitral valve prolapse 12/07/2019   Rash 02/02/2019   Loss of hair 02/02/2019   Unilateral  primary osteoarthritis, left knee 10/28/2017   Chronic pain of left knee 09/30/2017   BMI 31.0-31.9,adult 10/02/2014   Hypertensive heart disease without heart failure 10/02/2014    ONSET DATE: 04/10/24 referral  REFERRING DIAG:  E45.409 (ICD-10-CM) - Lumbar stenosis with neurogenic claudication  Z98.890 (ICD-10-CM) - S/P lumbar laminectomy    THERAPY DIAG:  Muscle weakness (generalized)  Abnormal posture  Unsteadiness on feet  Other abnormalities of gait and mobility  Difficulty in walking, not elsewhere classified  Rationale for  Evaluation and Treatment: Rehabilitation  SUBJECTIVE:                                                                                                                                                                                             SUBJECTIVE STATEMENT: Patient reports doing well- is just busy. Denies falls.   Pt accompanied by: self  PERTINENT HISTORY: angina, CAD, DM, heart murmur, HLD, HTN, kidney stones, MVP, h/o multiple back sx  PAIN:  Are you having pain? Yes: NPRS scale: 3/10 Pain location: area of sx Pain description: tender Aggravating factors: staying in 1 position for too long  Relieving factors: not able to recall  PRECAUTIONS: Back and Fall   PATIENT GOALS: "to strengthen my muscles"  OBJECTIVE:  Note: Objective measures were completed at Evaluation unless otherwise noted.  DIAGNOSTIC FINDINGS: 01/19/24 CT lumbar  IMPRESSION: 1. No fracture or acute findings. 2. Intact L4-L5 posterior fusion hardware. 3. Multilevel degenerative change with spinal canal and neural foraminal stenosis as described. 4. Bilateral intrarenal calculi. Aortic Atherosclerosis (ICD10-I70.0).                                                                                                                              TREATMENT: Therex -scifit hills level 4x10 mins B UE /LE for CV priming -hooklying bridges  -LTR -bird dog   Theract: -anterior step over 4" hurdle 5lb ankle weight  -lateral step over 4" hurdle 5lb ankle weight  -anterior step over 6" hurdle from Airex  -lateral step over 6" hurdles from airex   PATIENT EDUCATION:  Education details: Continue HEP Person educated: Patient Education method: Medical illustrator Education comprehension: verbalized understanding and needs further education  HOME EXERCISE PROGRAM: Access Code: [email or web address] URL: [email or web address] Date: 04/27/2024 Prepared by: Nickola Baron  Exercises - Clamshell   - 1 x daily - 7 x weekly - 3 sets - 10 reps - Supine March  - 1 x daily - 7 x weekly - 3 sets - 10 reps - Sit to Stand  - 1 x daily - 7 x weekly - 3 sets - 10 reps - Heel Raises with Counter Support  - 1 x daily - 7 x weekly - 3 sets - 10 reps - Standing Hip Extension with Counter Support  - 1 x daily - 7 x weekly - 3 sets - 10 reps - Standing March with Counter Support  - 1 x daily - 7 x weekly - 3 sets - 10 reps  GOALS: Goals reviewed with patient? Yes  SHORT TERM GOALS: = LTG based on PT POC length   LONG TERM GOALS: Target date: 05/29/24  Pt will be independent with final HEP for improved functional strength  Baseline: to be updated Goal status: INITIAL  2.  Patient will improve Oswestry score to </=16 to demonstrate a reduction in her disability due to back pain Baseline: 23 Goal status: INITIAL  ASSESSMENT:  CLINICAL IMPRESSION: Patient seen for skilled PT session with emphasis on dynamic balance and core strengthening. Continues to demonstrate compensatory trendelenburg. Patient expecting to have her precautions lifted at next appt. Continue POC.   OBJECTIVE IMPAIRMENTS: Abnormal gait, decreased knowledge of condition, decreased knowledge of use of DME, decreased mobility, difficulty walking, decreased strength, improper body mechanics, postural dysfunction, and pain.   ACTIVITY LIMITATIONS: carrying, lifting, bending, sitting, standing, bed mobility, hygiene/grooming, locomotion level, and caring for others  PARTICIPATION LIMITATIONS: meal prep, cleaning, interpersonal relationship, driving, shopping, community activity, and occupation  PERSONAL FACTORS: Age, Past/current experiences, Profession, and Social background are also affecting patient's functional outcome.   REHAB POTENTIAL: Good  CLINICAL DECISION MAKING: Stable/uncomplicated  EVALUATION COMPLEXITY: Low  PLAN:  PT FREQUENCY: 1x/week  PT DURATION: 4 weeks  PLANNED INTERVENTIONS: 97164- PT Re-evaluation,  97750- Physical Performance Testing, 97110-Therapeutic exercises, 97530- Therapeutic activity, [id number]- Neuromuscular re-education, 97535- Self Care, 54098- Manual therapy, [phone]- Gait training, [phone]- Aquatic Therapy, [phone]- Electrical stimulation (manual), Patient/Family education, Balance training, Stair training, Taping, Scar mobilization, Vestibular training, Visual/preceptual remediation/compensation, DME instructions, Cryotherapy, and Moist heat  PLAN FOR NEXT SESSION: Functional strengthening, single leg stability, posterior chain strength, core stability, squats, re-cert vs dc depending on if precautions were lifted   Rebecca Campus, PT Rebecca Campus, PT, DPT, CBIS   05/27/2024, 12:16 PM

## 2024-05-28 ENCOUNTER — Other Ambulatory Visit: Payer: Self-pay | Admitting: Nurse Practitioner

## 2024-05-28 DIAGNOSIS — Z1231 Encounter for screening mammogram for malignant neoplasm of breast: Secondary | ICD-10-CM

## 2024-06-01 ENCOUNTER — Telehealth: Payer: Self-pay

## 2024-06-01 NOTE — Telephone Encounter (Signed)
 Patient needs an appointment for June for a f/u. Left message on voicemail to call back to schedule her appt for any day that is good for her schedule.

## 2024-06-03 ENCOUNTER — Ambulatory Visit: Payer: PRIVATE HEALTH INSURANCE | Admitting: Physical Therapy

## 2024-06-03 DIAGNOSIS — R2689 Other abnormalities of gait and mobility: Secondary | ICD-10-CM

## 2024-06-03 DIAGNOSIS — M5459 Other low back pain: Secondary | ICD-10-CM

## 2024-06-03 DIAGNOSIS — M6281 Muscle weakness (generalized): Secondary | ICD-10-CM

## 2024-06-03 NOTE — Therapy (Signed)
 OUTPATIENT PHYSICAL THERAPY NEURO TREATMENT- RECERTIFICATION   Patient Name: LORIE CLECKLEY MRN: [id number] DOB:1961/11/29, 63 y.o., female Today's Date: 06/03/2024   PCP: Susanna Epley, FNP REFERRING PROVIDER: Audie Bleacher, MD  END OF SESSION:  PT End of Session - 06/03/24 0933     Visit Number 6    Number of Visits 10   Recert   Date for PT Re-Evaluation 07/08/24    Authorization Type [employer]    PT Start Time 0932    PT Stop Time 1014    PT Time Calculation (min) 42 min    Equipment Utilized During Treatment --    Activity Tolerance Patient tolerated treatment well    Behavior During Therapy [facility] for tasks assessed/performed               Past Medical History:  Diagnosis Date   Angina pectoris (HCC) 02/22/2020   CAD in native artery 02/22/2020   Chest pain of uncertain etiology 01/06/2020   Diabetes mellitus without complication (HCC)    Heart murmur    asymptomatic   Hyperlipidemia    Hypertension    Kidney stones    Kidney stones    several times, last  one 2014   Mitral valve prolapse    MVP (mitral valve prolapse)    Pure hypercholesterolemia 05/02/2020   Snoring 01/06/2020   Past Surgical History:  Procedure Laterality Date   BACK SURGERY     x2   BREAST SURGERY     cyst right breast   CARDIAC CATHETERIZATION     CARPAL TUNNEL RELEASE Bilateral    CHOLECYSTECTOMY     CORONARY STENT INTERVENTION N/A 02/26/2020   Procedure: CORONARY STENT INTERVENTION;  Surgeon: Arleen Lacer, MD;  Location: [facility] INVASIVE CV LAB;  Service: Cardiovascular;  Laterality: N/A;   DILATATION & CURRETTAGE/HYSTEROSCOPY WITH RESECTOCOPE N/A 08/11/2013   Procedure: DILATATION & CURETTAGE/HYSTEROSCOPY WITH RESECTOCOPE;  Surgeon: Kandra Orn, MD;  Location: [facility] ORS;  Service: Gynecology;  Laterality: N/A;   LEFT HEART CATH AND CORONARY ANGIOGRAPHY N/A 02/26/2020   Procedure: LEFT HEART CATH AND CORONARY ANGIOGRAPHY;  Surgeon: Arleen Lacer, MD;  Location: [facility] INVASIVE CV  LAB;  Service: Cardiovascular;  Laterality: N/A;   LEFT HEART CATH AND CORONARY ANGIOGRAPHY N/A 11/25/2023   Procedure: LEFT HEART CATH AND CORONARY ANGIOGRAPHY;  Surgeon: [place]il[place], Peter M, MD;  Location: [facility] INVASIVE CV LAB;  Service: Cardiovascular;  Laterality: N/A;   LUMBAR LAMINECTOMY/DECOMPRESSION MICRODISCECTOMY N/A 04/08/2024   Procedure: LUMBAR LAMINECTOMY/DECOMPRESSION MICRODISCECTOMY LUMBAR THREE-FOUR;  Surgeon: Audie Bleacher, MD;  Location: [facility] OR;  Service: Neurosurgery;  Laterality: N/A;   TONSILLECTOMY     TRIGGER FINGER RELEASE     left thumb   TUBAL LIGATION     Patient Active Problem List   Diagnosis Date Noted   Lumbar stenosis with neurogenic claudication 04/08/2024   Diabetes mellitus (HCC) 02/21/2024   COVID-19 vaccination declined 02/21/2024   Class 1 obesity due to excess calories with serious comorbidity and body mass index (BMI) of 30.0 to 30.9 in adult 02/21/2024   Sleep apnea, unspecified 07/27/2021   Pure hypercholesterolemia 05/02/2020   Abnormal cardiac CT angiography 02/26/2020   Angina pectoris (HCC) 02/22/2020   Coronary artery disease involving native coronary artery of native heart with angina pectoris (HCC) 02/22/2020   Chest pain of uncertain etiology 01/06/2020   Snoring 01/06/2020   Mitral valve prolapse 01/06/2020   History of mitral valve prolapse 12/07/2019   Rash 02/02/2019   Loss of hair 02/02/2019  Unilateral primary osteoarthritis, left knee 10/28/2017   Chronic pain of left knee 09/30/2017   BMI 31.0-31.9,adult 10/02/2014   Hypertensive heart disease without heart failure 10/02/2014    ONSET DATE: 04/10/24 referral  REFERRING DIAG:  Z61.096 (ICD-10-CM) - Lumbar stenosis with neurogenic claudication  Z98.890 (ICD-10-CM) - S/P lumbar laminectomy    THERAPY DIAG:  Muscle weakness (generalized)  Other abnormalities of gait and mobility  Other low back pain  Rationale for Evaluation and Treatment: Rehabilitation  SUBJECTIVE:                                                                                                                                                                                              SUBJECTIVE STATEMENT: Patient reports doing well. Had her BLT precautions uplifted. Has a follow-up w/MD on 7/21 and anticipates being cleared to return to work at that time.   Pt accompanied by: self  PERTINENT HISTORY: angina, CAD, DM, heart murmur, HLD, HTN, kidney stones, MVP, h/o multiple back sx  PAIN:  Are you having pain? Yes: NPRS scale: 1/10 Pain location: area of sx Pain description: tender Aggravating factors: staying in 1 position for too long  Relieving factors: not able to recall  PRECAUTIONS: Fall   PATIENT GOALS: to strengthen my muscles  OBJECTIVE:  Note: Objective measures were completed at Evaluation unless otherwise noted.  DIAGNOSTIC FINDINGS: 01/19/24 CT lumbar  IMPRESSION: 1. No fracture or acute findings. 2. Intact L4-L5 posterior fusion hardware. 3. Multilevel degenerative change with spinal canal and neural foraminal stenosis as described. 4. Bilateral intrarenal calculi. Aortic Atherosclerosis (ICD10-I70.0).                                                                                                                              TREATMENT: Ther Act  SciFit multi-peaks level 8.5 for 8 minutes using BUE/BLEs for neural priming for reciprocal movement, dynamic cardiovascular warmup and increased amplitude of stepping. RPE of 2/10 following activity  The following were performed for gentle spinal mobility and posterior chain strength:  Child's pose stretch, x90s hold. Pt  reported feeling more stretch in hips than low back  Prone press ups, x5 reps w/5-10s hold. Pt unable to achieve full elbow extension but denied pain.  Quadruped cat cows, x10 reps. Pt required min multimodal cues for proper technique initially but performed well. Added to HEP (see bolded below)   Quadruped thread the needles, x4 reps per side. Most difficulty reaching arm towards ceiling. Added to HEP (see bolded below)  Seated deadlift w/yellow resistance band, x15 reps. No pain reported with activity. Verbally added to HEP (see bolded below)  Modified Oswestry: 19/50   PATIENT EDUCATION: Education details: Updates to HEP, new LTG, using heating pad after new exercises  Person educated: Patient Education method: Explanation, Demonstration, Verbal cues, and Handouts Education comprehension: verbalized understanding, returned demonstration, verbal cues required, and needs further education  HOME EXERCISE PROGRAM: Access Code: [email or web address] URL: [email or web address] Date: 04/27/2024 Prepared by: Nickola Baron  Exercises - Clamshell  - 1 x daily - 7 x weekly - 3 sets - 10 reps - Supine March  - 1 x daily - 7 x weekly - 3 sets - 10 reps - Sit to Stand  - 1 x daily - 7 x weekly - 3 sets - 10 reps - Heel Raises with Counter Support  - 1 x daily - 7 x weekly - 3 sets - 10 reps - Standing Hip Extension with Counter Support  - 1 x daily - 7 x weekly - 3 sets - 10 reps - Standing March with Counter Support  - 1 x daily - 7 x weekly - 3 sets - 10 reps - Cat Cow  - 1 x daily - 7 x weekly - 3 sets - 10 reps - Quadruped Thread the Needle  - 1 x daily - 7 x weekly - 3 sets - 10 reps  Verbally added seated DL w/red theraband on 8/46  GOALS: Goals reviewed with patient? Yes  STG = LTG based on PT POC length   LONG TERM GOALS: Target date: 05/29/24  Pt will be independent with final HEP for improved functional strength  Baseline: to be updated Goal status: IN PROGRESS  2.  Patient will improve Oswestry score to </=16 to demonstrate a reduction in her disability due to back pain Baseline: 23; 19/50 (6/11) Goal status: NOT MET   NEW LONG TERM GOALS FOR EXTENDED POC:  Target date: 07/01/2024  Pt will be independent with final HEP for improved functional strength  Baseline:   Goal status: IN PROGRESS  2.  Pt will lift 10# object off ground w/proper body mechanics and no report of pain for improved functional mobility and work simulation  Baseline:  Goal status: INITIAL  3.  Patient will improve Oswestry score to </=13 to demonstrate a reduction in her disability due to back pain Baseline: 23; 19/50 (6/11) Goal status: INITIAL  ASSESSMENT:  CLINICAL IMPRESSION: Emphasis of skilled PT session on LTG assessment, recertification and initiation of gentle lumbar flexion. Pt did have her BLT restrictions uplifted so will focus on stability w/lifting and rotating over next 4 weeks as this is required for pt's occupation. Pt planning to return to work at end of July. Pt did not meet any LTGs this date as her HEP is not finalized. Pt did improve her score on Modified Oswestry but not quite to goal level. Pt will need to lift 10# off ground for her job so will continue to work on proper lifting technique and posterior chain stability. Pt in agreement to recert  at a frequency of 1x/week for 4 weeks. Continue POC.   OBJECTIVE IMPAIRMENTS: Abnormal gait, decreased knowledge of condition, decreased knowledge of use of DME, decreased mobility, difficulty walking, decreased strength, improper body mechanics, postural dysfunction, and pain.   ACTIVITY LIMITATIONS: carrying, lifting, bending, sitting, standing, bed mobility, hygiene/grooming, locomotion level, and caring for others  PARTICIPATION LIMITATIONS: meal prep, cleaning, interpersonal relationship, driving, shopping, community activity, and occupation  PERSONAL FACTORS: Age, Past/current experiences, Profession, and Social background are also affecting patient's functional outcome.   REHAB POTENTIAL: Good  CLINICAL DECISION MAKING: Stable/uncomplicated  EVALUATION COMPLEXITY: Low  PLAN:  PT FREQUENCY: 1x/week  PT DURATION: 4 weeks + 4 weeks (recert)  PLANNED INTERVENTIONS: 16109- PT Re-evaluation, 97750-  Physical Performance Testing, 97110-Therapeutic exercises, 97530- Therapeutic activity, [id number]- Neuromuscular re-education, 97535- Self Care, 60454- Manual therapy, [id number]- Gait training, [phone]- Aquatic Therapy, [phone]- Electrical stimulation (manual), Patient/Family education, Balance training, Stair training, Taping, Scar mobilization, Vestibular training, Visual/preceptual remediation/compensation, DME instructions, Cryotherapy, and Moist heat  PLAN FOR NEXT SESSION: Functional strengthening, single leg stability, posterior chain strength, core stability, squats, truncal mobility, deadlift progression   Kaile Bixler E Avyonna Wagoner, PT, DPT   06/03/2024, 10:15 AM

## 2024-06-05 LAB — HM MAMMOGRAPHY

## 2024-06-10 ENCOUNTER — Encounter: Payer: Self-pay | Admitting: Nurse Practitioner

## 2024-06-11 ENCOUNTER — Ambulatory Visit: Payer: PRIVATE HEALTH INSURANCE

## 2024-06-11 DIAGNOSIS — R2681 Unsteadiness on feet: Secondary | ICD-10-CM

## 2024-06-11 DIAGNOSIS — M5459 Other low back pain: Secondary | ICD-10-CM

## 2024-06-11 DIAGNOSIS — R262 Difficulty in walking, not elsewhere classified: Secondary | ICD-10-CM

## 2024-06-11 DIAGNOSIS — M6281 Muscle weakness (generalized): Secondary | ICD-10-CM | POA: Diagnosis not present

## 2024-06-11 DIAGNOSIS — R2689 Other abnormalities of gait and mobility: Secondary | ICD-10-CM

## 2024-06-11 DIAGNOSIS — R293 Abnormal posture: Secondary | ICD-10-CM

## 2024-06-11 NOTE — Therapy (Addendum)
 OUTPATIENT PHYSICAL THERAPY NEURO TREATMENT   Patient Name: Mary Holt MRN: [id number] DOB:[birth year], 63 y.o., female Today's Date: 06/11/2024   PCP: Susanna Epley, FNP REFERRING PROVIDER: Audie Bleacher, MD  END OF SESSION:  PT End of Session - 06/11/24 1046     Visit Number 7    Number of Visits 10    Date for PT Re-Evaluation 07/08/24    Authorization Type [employer]    PT Start Time 1055    PT Stop Time 1140    PT Time Calculation (min) 45 min    Equipment Utilized During Treatment Gait belt    Activity Tolerance Patient tolerated treatment well    Behavior During Therapy WFL for tasks assessed/performed            Past Medical History:  Diagnosis Date   Angina pectoris (HCC) 02/22/2020   CAD in native artery 02/22/2020   Chest pain of uncertain etiology 01/06/2020   Diabetes mellitus without complication (HCC)    Heart murmur    asymptomatic   Hyperlipidemia    Hypertension    Kidney stones    Kidney stones    several times, last  one 2014   Mitral valve prolapse    MVP (mitral valve prolapse)    Pure hypercholesterolemia 05/02/2020   Snoring 01/06/2020   Past Surgical History:  Procedure Laterality Date   BACK SURGERY     x2   BREAST SURGERY     cyst right breast   CARDIAC CATHETERIZATION     CARPAL TUNNEL RELEASE Bilateral    CHOLECYSTECTOMY     CORONARY STENT INTERVENTION N/A 02/26/2020   Procedure: CORONARY STENT INTERVENTION;  Surgeon: Arleen Lacer, MD;  Location: [facility];  Service: Cardiovascular;  Laterality: N/A;   DILATATION & CURRETTAGE/HYSTEROSCOPY WITH RESECTOCOPE N/A 08/11/2013   Procedure: DILATATION & CURETTAGE/HYSTEROSCOPY WITH RESECTOCOPE;  Surgeon: Kandra Orn, MD;  Location: [facility];  Service: Gynecology;  Laterality: N/A;   LEFT HEART CATH AND CORONARY ANGIOGRAPHY N/A 02/26/2020   Procedure: LEFT HEART CATH AND CORONARY ANGIOGRAPHY;  Surgeon: Arleen Lacer, MD;  Location: [facility] INVASIVE CV LAB;  Service:  Cardiovascular;  Laterality: N/A;   LEFT HEART CATH AND CORONARY ANGIOGRAPHY N/A 11/25/2023   Procedure: LEFT HEART CATH AND CORONARY ANGIOGRAPHY;  Surgeon: Swaz[place], Peter M, MD;  Location: [facility] INVASIVE CV LAB;  Service: Cardiovascular;  Laterality: N/A;   LUMBAR LAMINECTOMY/DECOMPRESSION MICRODISCECTOMY N/A 04/08/2024   Procedure: LUMBAR LAMINECTOMY/DECOMPRESSION MICRODISCECTOMY LUMBAR THREE-FOUR;  Surgeon: Audie Bleacher, MD;  Location: [facility];  Service: Neurosurgery;  Laterality: N/A;   TONSILLECTOMY     TRIGGER FINGER RELEASE     left thumb   TUBAL LIGATION     Patient Active Problem List   Diagnosis Date Noted   Lumbar stenosis with neurogenic claudication 04/08/2024   Diabetes mellitus (HCC) 02/21/2024   COVID-19 vaccination declined 02/21/2024   Class 1 obesity due to excess calories with serious comorbidity and body mass index (BMI) of 30.0 to 30.9 in adult 02/21/2024   Sleep apnea, unspecified 07/27/2021   Pure hypercholesterolemia 05/02/2020   Abnormal cardiac CT angiography 02/26/2020   Angina pectoris (HCC) 02/22/2020   Coronary artery disease involving native coronary artery of native heart with angina pectoris (HCC) 02/22/2020   Chest pain of uncertain etiology 01/06/2020   Snoring 01/06/2020   Mitral valve prolapse 01/06/2020   History of mitral valve prolapse 12/07/2019   Rash 02/02/2019   Loss of hair 02/02/2019   Unilateral primary osteoarthritis,  left knee 10/28/2017   Chronic pain of left knee 09/30/2017   BMI 31.0-31.9,adult 10/02/2014   Hypertensive heart disease without heart failure 10/02/2014    ONSET DATE: 04/10/24 referral  REFERRING DIAG:  Z61.096 (ICD-10-CM) - Lumbar stenosis with neurogenic claudication  Z98.890 (ICD-10-CM) - S/P lumbar laminectomy    THERAPY DIAG:  No diagnosis found.  Rationale for Evaluation and Treatment: Rehabilitation  SUBJECTIVE:                                                                                                                                                                                              SUBJECTIVE STATEMENT: Patient reports doing well. Has been busy running errands and doing a lot of walking. Was a little sore after last session but it subsided after a day. HEP is going okay; no increase in pain as she does them. Pt accompanied by: self  PERTINENT HISTORY: angina, CAD, DM, heart murmur, HLD, HTN, kidney stones, MVP, h/o multiple back sx  PAIN:  Are you having pain? Yes: NPRS scale: 2/10 Pain location: area of sx Pain description: tender Aggravating factors: staying in 1 position for too long  Relieving factors: not able to recall  PRECAUTIONS: Fall   PATIENT GOALS: to strengthen my muscles  OBJECTIVE:  Note: Objective measures were completed at Evaluation unless otherwise noted.  DIAGNOSTIC FINDINGS: 01/19/24 CT lumbar  IMPRESSION: 1. No fracture or acute findings. 2. Intact L4-L5 posterior fusion hardware. 3. Multilevel degenerative change with spinal canal and neural foraminal stenosis as described. 4. Bilateral intrarenal calculi. Aortic Atherosclerosis (ICD10-I70.0).                                                                                                                              TREATMENT: Therapeutic Activity:  SciFit multi-peaks level 8.5 for 10 minutes using BUE/BLEs for neural priming for reciprocal movement, dynamic cardiovascular warmup and increased amplitude of stepping (RPE: 3/10)   Therex: Hip bridges 8 reps: BW > 6# DB > 8# DB - 3 sets total (RPE: 5/10) Pt required VC/TC for glute activation  with decent carryover Deadlift 10 reps: BW with dowel > BW without dowel > two 5# DBs (RPE: 6/10)  PT utilized dowel for the first set to ensure proper form and technique Green t-ball rollout - 5 reps with 3 deep breath holds Pt reported no increase in LBP  PATIENT EDUCATION: Education details: Updates to HEP, new LTG, using heating pad after new  exercises  Person educated: Patient Education method: Explanation, Demonstration, Verbal cues, and Handouts Education comprehension: verbalized understanding, returned demonstration, verbal cues required, and needs further education  HOME EXERCISE PROGRAM: Access Code: [email or web address] URL: [email or web address] Date: 04/27/2024 Prepared by: Nickola Baron  Exercises - Clamshell  - 1 x daily - 7 x weekly - 3 sets - 10 reps - Supine March  - 1 x daily - 7 x weekly - 3 sets - 10 reps - Sit to Stand  - 1 x daily - 7 x weekly - 3 sets - 10 reps - Heel Raises with Counter Support  - 1 x daily - 7 x weekly - 3 sets - 10 reps - Standing Hip Extension with Counter Support  - 1 x daily - 7 x weekly - 3 sets - 10 reps - Standing March with Counter Support  - 1 x daily - 7 x weekly - 3 sets - 10 reps - Cat Cow  - 1 x daily - 7 x weekly - 3 sets - 10 reps - Quadruped Thread the Needle  - 1 x daily - 7 x weekly - 3 sets - 10 reps  Verbally added seated DL w/red theraband on 5/40  GOALS: Goals reviewed with patient? Yes  STG = LTG based on PT POC length   LONG TERM GOALS: Target date: 05/29/24  Pt will be independent with final HEP for improved functional strength  Baseline: to be updated Goal status: IN PROGRESS  2.  Patient will improve Oswestry score to </=16 to demonstrate a reduction in her disability due to back pain Baseline: 23; 19/50 (6/11) Goal status: NOT MET   NEW LONG TERM GOALS FOR EXTENDED POC:  Target date: 07/01/2024  Pt will be independent with final HEP for improved functional strength  Baseline:  Goal status: IN PROGRESS  2.  Pt will lift 10# object off ground w/proper body mechanics and no report of pain for improved functional mobility and work simulation  Baseline:  Goal status: INITIAL  3.  Patient will improve Oswestry score to </=13 to demonstrate a reduction in her disability due to back pain Baseline: 23; 19/50 (6/11) Goal status:  INITIAL  ASSESSMENT:  CLINICAL IMPRESSION: Pt was seen for skilled PT treatment session with an emphasis on core engagement/stabilization and functional bilat LE strengthening. Pt continues to progress towards all goals set at the initial evaluation. Pt did exhibit hesitancy in twisting, seen with supine > sit transfer. PT educated pt on performing this mvmt to tolerance as the BLT restrictions have been removed. PT will continue to incorporate targeted functional therex to improve muscular strength and endurance required for ADLs and occupation. Pt tolerated all the therex well with no increase in pain throughout the entirety of the session. Continue POC.   OBJECTIVE IMPAIRMENTS: Abnormal gait, decreased knowledge of condition, decreased knowledge of use of DME, decreased mobility, difficulty walking, decreased strength, improper body mechanics, postural dysfunction, and pain.   ACTIVITY LIMITATIONS: carrying, lifting, bending, sitting, standing, bed mobility, hygiene/grooming, locomotion level, and caring for others  PARTICIPATION LIMITATIONS: meal prep, cleaning, interpersonal relationship, driving, shopping, community  activity, and occupation  PERSONAL FACTORS: Age, Past/current experiences, Profession, and Social background are also affecting patient's functional outcome.   REHAB POTENTIAL: Good  CLINICAL DECISION MAKING: Stable/uncomplicated  EVALUATION COMPLEXITY: Low  PLAN:  PT FREQUENCY: 1x/week  PT DURATION: 4 weeks + 4 weeks (recert)  PLANNED INTERVENTIONS: 16109- PT Re-evaluation, 97750- Physical Performance Testing, 97110-Therapeutic exercises, 97530- Therapeutic activity, [id number]- Neuromuscular re-education, 97535- Self Care, 60454- Manual therapy, [phone]- Gait training, [phone]- Aquatic Therapy, [phone]- Electrical stimulation (manual), Patient/Family education, Balance training, Stair training, Taping, Scar mobilization, Vestibular training, Visual/preceptual  remediation/compensation, DME instructions, Cryotherapy, and Moist heat  PLAN FOR NEXT SESSION: continue with functional bilat LE strengthening, incorporate SLS therex, core stability progression posterior chain strength; squats, deadlift   Barbara Book, Student-PT [facility], PT, DPT, CBIS  06/11/2024, 12:23 PM

## 2024-06-16 ENCOUNTER — Other Ambulatory Visit: Payer: Self-pay | Admitting: Nurse Practitioner

## 2024-06-16 DIAGNOSIS — E1169 Type 2 diabetes mellitus with other specified complication: Secondary | ICD-10-CM

## 2024-06-17 ENCOUNTER — Ambulatory Visit: Payer: PRIVATE HEALTH INSURANCE

## 2024-06-17 DIAGNOSIS — R2689 Other abnormalities of gait and mobility: Secondary | ICD-10-CM

## 2024-06-17 DIAGNOSIS — R293 Abnormal posture: Secondary | ICD-10-CM

## 2024-06-17 DIAGNOSIS — R2681 Unsteadiness on feet: Secondary | ICD-10-CM

## 2024-06-17 DIAGNOSIS — M6281 Muscle weakness (generalized): Secondary | ICD-10-CM

## 2024-06-17 DIAGNOSIS — R262 Difficulty in walking, not elsewhere classified: Secondary | ICD-10-CM

## 2024-06-17 DIAGNOSIS — M5459 Other low back pain: Secondary | ICD-10-CM

## 2024-06-17 NOTE — Therapy (Signed)
 OUTPATIENT PHYSICAL THERAPY NEURO TREATMENT   Patient Name: Mary Holt MRN: [id number] DOB:[birth year], 63 y.o., female Today's Date: 06/17/2024   PCP: [name], FNP REFERRING PROVIDER: Rockey Peru, MD  END OF SESSION:  PT End of Session - 06/17/24 1144     Visit Number 8    Number of Visits 10    Date for PT Re-Evaluation 07/08/24    Authorization Type [employer]    PT Start Time 1145    PT Stop Time 1228    PT Time Calculation (min) 43 min    Equipment Utilized During Treatment Gait belt    Activity Tolerance Patient tolerated treatment well    Behavior During Therapy WFL for tasks assessed/performed            Past Medical History:  Diagnosis Date   Angina pectoris (HCC) 02/22/2020   CAD in native artery 02/22/2020   Chest pain of uncertain etiology 01/06/2020   Diabetes mellitus without complication (HCC)    Heart murmur    asymptomatic   Hyperlipidemia    Hypertension    Kidney stones    Kidney stones    several times, last  one 2014   Mitral valve prolapse    MVP (mitral valve prolapse)    Pure hypercholesterolemia 05/02/2020   Snoring 01/06/2020   Past Surgical History:  Procedure Laterality Date   BACK SURGERY     x2   BREAST SURGERY     cyst right breast   CARDIAC CATHETERIZATION     CARPAL TUNNEL RELEASE Bilateral    CHOLECYSTECTOMY     CORONARY STENT INTERVENTION N/A 02/26/2020   Procedure: CORONARY STENT INTERVENTION;  Surgeon: Anner Alm ORN, MD;  Location: [facility];  Service: Cardiovascular;  Laterality: N/A;   DILATATION & CURRETTAGE/HYSTEROSCOPY WITH RESECTOCOPE N/A 08/11/2013   Procedure: DILATATION & CURETTAGE/HYSTEROSCOPY WITH RESECTOCOPE;  Surgeon: Dickie DELENA Carder, MD;  Location: [facility];  Service: Gynecology;  Laterality: N/A;   LEFT HEART CATH AND CORONARY ANGIOGRAPHY N/A 02/26/2020   Procedure: LEFT HEART CATH AND CORONARY ANGIOGRAPHY;  Surgeon: Anner Alm ORN, MD;  Location: [facility] INVASIVE CV LAB;  Service:  Cardiovascular;  Laterality: N/A;   LEFT HEART CATH AND CORONARY ANGIOGRAPHY N/A 11/25/2023   Procedure: LEFT HEART CATH AND CORONARY ANGIOGRAPHY;  Surgeon: Swazil[place], Peter M, MD;  Location: [facility] INVASIVE CV LAB;  Service: Cardiovascular;  Laterality: N/A;   LUMBAR LAMINECTOMY/DECOMPRESSION MICRODISCECTOMY N/A 04/08/2024   Procedure: LUMBAR LAMINECTOMY/DECOMPRESSION MICRODISCECTOMY LUMBAR THREE-FOUR;  Surgeon: Peru Rockey, MD;  Location: [facility];  Service: Neurosurgery;  Laterality: N/A;   TONSILLECTOMY     TRIGGER FINGER RELEASE     left thumb   TUBAL LIGATION     Patient Active Problem List   Diagnosis Date Noted   Lumbar stenosis with neurogenic claudication 04/08/2024   Diabetes mellitus (HCC) 02/21/2024   COVID-19 vaccination declined 02/21/2024   Class 1 obesity due to excess calories with serious comorbidity and body mass index (BMI) of 30.0 to 30.9 in adult 02/21/2024   Sleep apnea, unspecified 07/27/2021   Pure hypercholesterolemia 05/02/2020   Abnormal cardiac CT angiography 02/26/2020   Angina pectoris (HCC) 02/22/2020   Coronary artery disease involving native coronary artery of native heart with angina pectoris (HCC) 02/22/2020   Chest pain of uncertain etiology 01/06/2020   Snoring 01/06/2020   Mitral valve prolapse 01/06/2020   History of mitral valve prolapse 12/07/2019   Rash 02/02/2019   Loss of hair 02/02/2019   Unilateral primary osteoarthritis,  left knee 10/28/2017   Chronic pain of left knee 09/30/2017   BMI 31.0-31.9,adult 10/02/2014   Hypertensive heart disease without heart failure 10/02/2014    ONSET DATE: 04/10/24 referral  REFERRING DIAG:  F51.937 (ICD-10-CM) - Lumbar stenosis with neurogenic claudication  Z98.890 (ICD-10-CM) - S/P lumbar laminectomy    THERAPY DIAG:  Muscle weakness (generalized)  Abnormal posture  Other abnormalities of gait and mobility  Other low back pain  Difficulty in walking, not elsewhere classified  Unsteadiness on  feet  Rationale for Evaluation and Treatment: Rehabilitation  SUBJECTIVE:                                                                                                                                                                                             SUBJECTIVE STATEMENT: Patient reports she doing well overall. Has been busy running errands and doing a lot of walking. Was a little sore after last session but it subsided after a day; no increase in pain. Pt states she has not completed HEP since last week.  Pt accompanied by: self  PERTINENT HISTORY: angina, CAD, DM, heart murmur, HLD, HTN, kidney stones, MVP, h/o multiple back sx  PAIN:  Are you having pain? Yes: NPRS scale: 1/10 Pain location: area of sx Pain description: tender Aggravating factors: staying in 1 position for too long  Relieving factors: not able to recall  PRECAUTIONS: Fall   PATIENT GOALS: to strengthen my muscles  OBJECTIVE:  Note: Objective measures were completed at Evaluation unless otherwise noted.  DIAGNOSTIC FINDINGS: 01/19/24 CT lumbar  IMPRESSION: 1. No fracture or acute findings. 2. Intact L4-L5 posterior fusion hardware. 3. Multilevel degenerative change with spinal canal and neural foraminal stenosis as described. 4. Bilateral intrarenal calculi. Aortic Atherosclerosis (ICD10-I70.0).                                                                                                                              TREATMENT: Therapeutic Activity:  SciFit multi-peaks level 8.6 for 10 minutes using BUE/BLEs for neural priming for reciprocal movement, dynamic cardiovascular warmup and increased  amplitude of stepping (RPE: 3/10)   Therex: Deadlift 12 reps: BW with dowel > two 6# DBs (RPE: 6/10)  PT utilized dowel for the first set to ensure proper form and technique Pt good carryover for proper form and technique, minimal verbal cues required Pt reported no increase in LBP STS at 20 mat  table to 10lbs ball throw - 12 reps (RPE: 7/10) Pt challenged with multi-step task sequencing  Green t-ball rollout - 2x with 3 deep breath holds Pt reported no increase in LBP  PATIENT EDUCATION: Education details: Instructions on updated HEP, incorporating heat modality for pain modulation at home  Person educated: Patient Education method: Programmer, multimedia, Demonstration, Verbal cues, and Handouts Education comprehension: verbalized understanding, returned demonstration, verbal cues required, and needs further education  HOME EXERCISE PROGRAM: Access Code: [email or web address] URL: [email or web address] Date: 04/27/2024 Prepared by: Delon Pop  Exercises - Clamshell  - 1 x daily - 7 x weekly - 3 sets - 10 reps - Supine March  - 1 x daily - 7 x weekly - 3 sets - 10 reps - Sit to Stand  - 1 x daily - 7 x weekly - 3 sets - 10 reps - Heel Raises with Counter Support  - 1 x daily - 7 x weekly - 3 sets - 10 reps - Standing Hip Extension with Counter Support  - 1 x daily - 7 x weekly - 3 sets - 10 reps - Standing March with Counter Support  - 1 x daily - 7 x weekly - 3 sets - 10 reps - Cat Cow  - 1 x daily - 7 x weekly - 3 sets - 10 reps - Quadruped Thread the Needle  - 1 x daily - 7 x weekly - 3 sets - 10 reps  Verbally added seated DL w/red theraband on 3/88  Verbally added standing paloff press w/green theraband on 6/25    GOALS: Goals reviewed with patient? Yes  STG = LTG based on PT POC length   LONG TERM GOALS: Target date: 05/29/24  Pt will be independent with final HEP for improved functional strength  Baseline: to be updated Goal status: IN PROGRESS  2.  Patient will improve Oswestry score to </=16 to demonstrate a reduction in her disability due to back pain Baseline: 23; 19/50 (6/11) Goal status: NOT MET   NEW LONG TERM GOALS FOR EXTENDED POC:  Target date: 07/01/2024  Pt will be independent with final HEP for improved functional strength  Baseline:  Goal  status: IN PROGRESS  2.  Pt will lift 10# object off ground w/proper body mechanics and no report of pain for improved functional mobility and work simulation  Baseline:  Goal status: INITIAL  3.  Patient will improve Oswestry score to </=13 to demonstrate a reduction in her disability due to back pain Baseline: 23; 19/50 (6/11) Goal status: INITIAL  ASSESSMENT:  CLINICAL IMPRESSION: Pt was seen for skilled PT treatment session with an emphasis on core engagement/stabilization and functional bilat LE muscular strengthening and endurance. Pt continues to progress towards all goals set at the initial evaluation. Pt highly benefits from PT verbal instructions and demonstration prior to engaging in therex. Pt continues to be challenged with multi-step sequencing as she requires moderate verbal and tactile cues from PT. Pt tolerated all the therex well with no increase in pain throughout the entirety of the session and exhibited great effort. Continue POC.   OBJECTIVE IMPAIRMENTS: Abnormal gait, decreased knowledge of condition, decreased knowledge of use  of DME, decreased mobility, difficulty walking, decreased strength, improper body mechanics, postural dysfunction, and pain.   ACTIVITY LIMITATIONS: carrying, lifting, bending, sitting, standing, bed mobility, hygiene/grooming, locomotion level, and caring for others  PARTICIPATION LIMITATIONS: meal prep, cleaning, interpersonal relationship, driving, shopping, community activity, and occupation  PERSONAL FACTORS: Age, Past/current experiences, Profession, and Social background are also affecting patient's functional outcome.   REHAB POTENTIAL: Good  CLINICAL DECISION MAKING: Stable/uncomplicated  EVALUATION COMPLEXITY: Low  PLAN:  PT FREQUENCY: 1x/week  PT DURATION: 4 weeks + 4 weeks (recert)  PLANNED INTERVENTIONS: 02835- PT Re-evaluation, 97750- Physical Performance Testing, 97110-Therapeutic exercises, 97530- Therapeutic activity,  [id number]- Neuromuscular re-education, 97535- Self Care, 02859- Manual therapy, [phone]- Gait training, [phone]- Aquatic Therapy, [phone]- Electrical stimulation (manual), Patient/Family education, Balance training, Stair training, Taping, Scar mobilization, Vestibular training, Visual/preceptual remediation/compensation, DME instructions, Cryotherapy, and Moist heat  PLAN FOR NEXT SESSION: continue with functional bilat LE strengthening, incorporate SLS therex, core stability progression posterior chain strength; squats, deadlift; incorporate rebounder?, farmer's carry   [employer], Student-PT Delon DELENA Pop, PT, DPT, CBIS  06/17/2024, 1:53 PM

## 2024-06-18 ENCOUNTER — Encounter: Payer: Self-pay | Admitting: Nurse Practitioner

## 2024-06-18 ENCOUNTER — Ambulatory Visit: Payer: PRIVATE HEALTH INSURANCE | Admitting: Nurse Practitioner

## 2024-06-18 VITALS — BP 116/74 | HR 91 | Temp 99.1°F | Ht 63.0 in | Wt 166.0 lb

## 2024-06-18 DIAGNOSIS — Z79899 Other long term (current) drug therapy: Secondary | ICD-10-CM

## 2024-06-18 DIAGNOSIS — Z0000 Encounter for general adult medical examination without abnormal findings: Secondary | ICD-10-CM | POA: Diagnosis not present

## 2024-06-18 DIAGNOSIS — I119 Hypertensive heart disease without heart failure: Secondary | ICD-10-CM | POA: Diagnosis not present

## 2024-06-18 DIAGNOSIS — I25119 Atherosclerotic heart disease of native coronary artery with unspecified angina pectoris: Secondary | ICD-10-CM | POA: Diagnosis not present

## 2024-06-18 DIAGNOSIS — E1159 Type 2 diabetes mellitus with other circulatory complications: Secondary | ICD-10-CM

## 2024-06-18 DIAGNOSIS — E7800 Pure hypercholesterolemia, unspecified: Secondary | ICD-10-CM

## 2024-06-18 NOTE — Assessment & Plan Note (Signed)
 Chronic, continue statin.  Cholesterol levels are improving.

## 2024-06-18 NOTE — Assessment & Plan Note (Signed)

## 2024-06-18 NOTE — Assessment & Plan Note (Addendum)
 Blood pressure is well controlled.  Continue current medications and follow-up with cardiology. EKG done with NSR.

## 2024-06-18 NOTE — Progress Notes (Signed)
 I,[name], CMA,acting as a Neurosurgeon for [employer], FNP.,have documented all relevant documentation on the behalf of Mary Ada, FNP,as directed by  Mary Ada, FNP while in the presence of Mary Ada, FNP.  Subjective:    Patient ID: Mary Holt , female    DOB: 1960/12/28 , 63 y.o.   MRN: [id number]  Chief Complaint  Patient presents with   Annual Exam    The patient is here today for HM.  She is followed by [facility] for her GYN care. Patient doesn't have any questions or concerns at this time. Patient reports she has an eye exam scheduled for July.    HPI  Here today for her HM. She is due to see Dr. Cabbell for neurosurgery, she is going once a week to PT and may be able to go back to work in the next couple of weeks.   Diabetes She presents for her follow-up diabetic visit. She has type 2 diabetes mellitus. Pertinent negatives for hypoglycemia include no headaches. There are no hypoglycemic complications. There are no diabetic complications. Risk factors for coronary artery disease include obesity and sedentary lifestyle. Current diabetic treatment includes oral agent (dual therapy). She has not had a previous visit with a dietitian. She participates in exercise daily (depending on weather). (Not checking her blood sugar regularly) An ACE inhibitor/angiotensin II receptor blocker is being taken. She does not see a podiatrist.Eye exam is not current.  Hypertension This is a chronic problem. The current episode started more than 1 year ago. The problem has been gradually improving since onset. The problem is controlled. Pertinent negatives include no anxiety or headaches. There are no associated agents to hypertension. Risk factors for coronary artery disease include dyslipidemia, sedentary lifestyle and obesity. Past treatments include angiotensin blockers. There are no compliance problems.  There is no history of angina. There is no history of chronic renal disease.      Past Medical History:  Diagnosis Date   Angina pectoris (HCC) 02/22/2020   CAD in native artery 02/22/2020   Chest pain of uncertain etiology 01/06/2020   Diabetes mellitus without complication (HCC)    Heart murmur    asymptomatic   Hyperlipidemia    Hypertension    Kidney stones    Kidney stones    several times, last  one 2014   Mitral valve prolapse    MVP (mitral valve prolapse)    Pure hypercholesterolemia 05/02/2020   Snoring 01/06/2020     Family History  Problem Relation Age of Onset   Stroke Mother    Heart attack Father    Breast cancer Sister    Heart attack Brother    Ovarian cancer Sister    Lymphoma Sister    Heart attack Brother    Colon cancer Neg Hx    Colon polyps Neg Hx    Esophageal cancer Neg Hx    Rectal cancer Neg Hx    Stomach cancer Neg Hx      Current Outpatient Medications:    aspirin  EC 81 MG tablet, Take 1 tablet (81 mg total) by mouth daily., Disp: 90 tablet, Rfl: 3   blood glucose meter kit and supplies KIT, Dispense based on patient and insurance preference. Use up to four times daily as directed., Disp: 1 each, Rfl: 0   Blood Glucose Monitoring Suppl DEVI, 1 each by Does not apply route in the morning, at noon, and at bedtime. May substitute to any manufacturer covered by patient's insurance., Disp:  1 each, Rfl: 0   diazepam  (VALIUM ) 5 MG tablet, Take 1 tablet (5 mg total) by mouth every 6 (six) hours as needed for muscle spasms., Disp: 30 tablet, Rfl: 0   fenofibrate  (TRICOR ) 145 MG tablet, TAKE 1 TABLET(145 MG) BY MOUTH DAILY, Disp: 90 tablet, Rfl: 3   JARDIANCE  25 MG TABS tablet, TAKE 1 TABLET(25 MG) BY MOUTH DAILY BEFORE BREAKFAST, Disp: 90 tablet, Rfl: 1   nitroGLYCERIN  (NITROSTAT ) 0.4 MG SL tablet, Place 1 tablet (0.4 mg total) under the tongue every 5 (five) minutes as needed for chest pain., Disp: 20 tablet, Rfl: 3   olmesartan  (BENICAR ) 40 MG tablet, Take 1 tablet (40 mg total) by mouth daily., Disp: 90 tablet, Rfl: 1   oxyCODONE   (OXY IR/ROXICODONE ) 5 MG immediate release tablet, 1 or 2 tablets every 4 hours as needed for severe pain, Disp: 40 tablet, Rfl: 0   oxyCODONE -acetaminophen  (PERCOCET/ROXICET) 5-325 MG tablet, Take 1 tablet by mouth every 8 (eight) hours as needed for up to 9 doses for severe pain (pain score 7-10)., Disp: 9 tablet, Rfl: 0   rosuvastatin  (CRESTOR ) 40 MG tablet, TAKE 1 TABLET BY MOUTH  DAILY AT 6 PM, Disp: 90 tablet, Rfl: 3   Semaglutide ,0.25 or 0.5MG /DOS, (OZEMPIC , 0.25 OR 0.5 MG/DOSE,) 2 MG/1.5ML SOPN, Inject 0.5 mg into the skin once a week., Disp: 4.5 mL, Rfl: 1   tiZANidine  (ZANAFLEX ) 4 MG tablet, Take 0.5 tablets (2 mg total) by mouth every 8 (eight) hours as needed for up to 10 doses for muscle spasms., Disp: 5 tablet, Rfl: 0   Allergies  Allergen Reactions   Relafen [Nabumetone] Rash   Penicillins Rash      The patient states she uses post menopausal status for birth control. Patient's last menstrual period was 09/30/2014.. Negative for Dysmenorrhea and Negative for Menorrhagia. Negative for: breast discharge, breast lump(s), breast pain and breast self exam. Associated symptoms include abnormal vaginal bleeding. Pertinent negatives include abnormal bleeding (hematology), anxiety, decreased libido, depression, difficulty falling sleep, dyspareunia, history of infertility, nocturia, sexual dysfunction, sleep disturbances, urinary incontinence, urinary urgency, vaginal discharge and vaginal itching. Diet regular; she has been eating [employer] carb smart ice cream, she does purchase many snacks - she will buy the veggie dip or lays low sodium. She does eat a lot of chicken breast. She does eat a lot of salad. The patient states her exercise level is minimal she had recent back surgery. She has been able to drive. She has been helping her sister who had open heart surgery.   The patient's tobacco use is:  Social History   Tobacco Use  Smoking Status Never  Smokeless Tobacco Never   She has  been exposed to passive smoke. The patient's alcohol use is:  Social History   Substance and Sexual Activity  Alcohol Use No   Additional information: Last pap 10/2021, next one scheduled for 10/2024.    Review of Systems  Constitutional: Negative.   HENT: Negative.    Eyes: Negative.   Respiratory: Negative.    Cardiovascular: Negative.   Gastrointestinal: Negative.   Endocrine: Negative.   Genitourinary: Negative.   Musculoskeletal: Negative.   Skin: Negative.   Allergic/Immunologic: Negative.   Neurological: Negative.  Negative for headaches.  Hematological: Negative.   Psychiatric/Behavioral: Negative.       Today's Vitals   06/18/24 1040  BP: 116/74  Pulse: 91  Temp: 99.1 F (37.3 C)  TempSrc: Oral  Weight: 166 lb (75.3 kg)  Height: 5' 3 (  1.6 m)  PainSc: 0-No pain   Body mass index is 29.41 kg/m.  Wt Readings from Last 3 Encounters:  06/18/24 166 lb (75.3 kg)  04/08/24 164 lb (74.4 kg)  03/26/24 164 lb 6.4 oz (74.6 kg)     Objective:  Physical Exam Vitals and nursing note reviewed.  Constitutional:      General: She is not in acute distress.    Appearance: Normal appearance. She is well-developed. She is obese.  HENT:     Head: Normocephalic and atraumatic.     Right Ear: Hearing, tympanic membrane, ear canal and external ear normal. There is no impacted cerumen.     Left Ear: Hearing, tympanic membrane, ear canal and external ear normal. There is no impacted cerumen.     Nose: Nose normal.     Mouth/Throat:     Mouth: Mucous membranes are moist.   Eyes:     General: Lids are normal.     Extraocular Movements: Extraocular movements intact.     Conjunctiva/sclera: Conjunctivae normal.     Pupils: Pupils are equal, round, and reactive to light.     Funduscopic exam:    Right eye: No papilledema.        Left eye: No papilledema.   Neck:     Thyroid : No thyroid  mass.     Vascular: No carotid bruit.   Cardiovascular:     Rate and Rhythm: Normal  rate and regular rhythm.     Pulses: Normal pulses.     Heart sounds: Normal heart sounds. No murmur heard. Pulmonary:     Effort: Pulmonary effort is normal. No respiratory distress.     Breath sounds: Normal breath sounds. No wheezing.  Chest:     Chest wall: No mass.  Breasts:    Tanner Score is 5.     Right: Normal. No mass or tenderness.     Left: Normal. No mass or tenderness.  Abdominal:     General: Abdomen is flat. Bowel sounds are normal. There is no distension.     Palpations: Abdomen is soft.     Tenderness: There is no abdominal tenderness.  Genitourinary:    Comments: Deferred - followed by GYN  Musculoskeletal:        General: No swelling. Normal range of motion.     Cervical back: Full passive range of motion without pain, normal range of motion and neck supple.     Right lower leg: No edema.     Left lower leg: No edema.  Lymphadenopathy:     Upper Body:     Right upper body: No supraclavicular, axillary or pectoral adenopathy.     Left upper body: No supraclavicular, axillary or pectoral adenopathy.   Skin:    General: Skin is warm and dry.     Capillary Refill: Capillary refill takes less than 2 seconds.   Neurological:     General: No focal deficit present.     Mental Status: She is alert and oriented to person, place, and time.     Cranial Nerves: No cranial nerve deficit.     Sensory: No sensory deficit.     Motor: No weakness.   Psychiatric:        Mood and Affect: Mood normal.        Behavior: Behavior normal.        Thought Content: Thought content normal.        Judgment: Judgment normal.  Assessment And Plan:     Encounter for general adult medical examination w/o abnormal findings Assessment & Plan: Behavior modifications discussed and diet history reviewed.   Pt will continue to exercise regularly and modify diet with low GI, plant based foods and decrease intake of processed foods.  Recommend intake of daily multivitamin,  Vitamin D, and calcium .  Recommend mammogram and colonoscopy for preventive screenings, as well as recommend immunizations that include influenza, TDAP, and Shingles   Orders: -     CBC  Hypertensive heart disease without heart failure Assessment & Plan: Blood pressure is well controlled.  Continue current medications and follow-up with cardiology. EKG done with NSR.   Orders: -     EKG 12-Lead  Type 2 diabetes mellitus with other circulatory complication, without long-term current use of insulin  (HCC) Assessment & Plan: She has only been taking Jardiance .  She is on 0.5 mg Ozempic . Will consider increasing once get results from A1c.   Orders: -     CMP14+EGFR -     Hemoglobin A1c  Pure hypercholesterolemia Assessment & Plan: Chronic, continue statin.  Cholesterol levels are improving.  Orders: -     Lipid panel  Coronary artery disease involving native coronary artery of native heart with angina pectoris [facility]) Assessment & Plan: Continue f/u with Cardiology   Other long term (current) drug therapy    Return for 1 year physical. Patient was given opportunity to ask questions. Patient verbalized understanding of the plan and was able to repeat key elements of the plan. All questions were answered to their satisfaction.   Mary Ada, FNP  I, Mary Ada, FNP, have reviewed all documentation for this visit. The documentation on 06/18/24 for the exam, diagnosis, procedures, and orders are all accurate and complete.

## 2024-06-18 NOTE — Assessment & Plan Note (Signed)
 She has only been taking Jardiance .  She is on 0.5 mg Ozempic . Will consider increasing once get results from A1c.

## 2024-06-18 NOTE — Assessment & Plan Note (Signed)
Continue f/u with Cardiology.

## 2024-06-19 LAB — CMP14+EGFR
ALT: 14 IU/L (ref 0–32)
AST: 16 IU/L (ref 0–40)
Albumin: 4.8 g/dL (ref 3.9–4.9)
Alkaline Phosphatase: 100 IU/L (ref 44–121)
BUN/Creatinine Ratio: 18 (ref 12–28)
BUN: 15 mg/dL (ref 8–27)
Bilirubin Total: 0.2 mg/dL (ref 0.0–1.2)
CO2: 19 mmol/L — ABNORMAL LOW (ref 20–29)
Calcium: 10.1 mg/dL (ref 8.7–10.3)
Chloride: 107 mmol/L — ABNORMAL HIGH (ref 96–106)
Creatinine, Ser: 0.84 mg/dL (ref 0.57–1.00)
Globulin, Total: 2.9 g/dL (ref 1.5–4.5)
Glucose: 103 mg/dL — ABNORMAL HIGH (ref 70–99)
Potassium: 4.5 mmol/L (ref 3.5–5.2)
Sodium: 145 mmol/L — ABNORMAL HIGH (ref 134–144)
Total Protein: 7.7 g/dL (ref 6.0–8.5)
eGFR: 78 mL/min/{1.73_m2} (ref >59)

## 2024-06-19 LAB — CBC
Hematocrit: 43.6 % (ref 34.0–46.6)
Hemoglobin: 13.4 g/dL (ref 11.1–15.9)
MCH: 27.4 pg (ref 26.6–33.0)
MCHC: 30.7 g/dL — ABNORMAL LOW (ref 31.5–35.7)
MCV: 89 fL (ref 79–97)
Platelets: 380 10*3/uL (ref 150–450)
RBC: 4.89 x10E6/uL (ref 3.77–5.28)
RDW: 13.1 % (ref 11.7–15.4)
WBC: 7.9 10*3/uL (ref 3.4–10.8)

## 2024-06-19 LAB — HEMOGLOBIN A1C
Est. average glucose Bld gHb Est-mCnc: 154 mg/dL
Hgb A1c MFr Bld: 7 % — ABNORMAL HIGH (ref 4.8–5.6)

## 2024-06-19 LAB — LIPID PANEL
Chol/HDL Ratio: 2.9 ratio (ref 0.0–4.4)
Cholesterol, Total: 148 mg/dL (ref 100–199)
HDL: 51 mg/dL (ref >39)
LDL Chol Calc (NIH): 84 mg/dL (ref 0–99)
Triglycerides: 61 mg/dL (ref 0–149)
VLDL Cholesterol Cal: 13 mg/dL (ref 5–40)

## 2024-06-24 ENCOUNTER — Ambulatory Visit: Payer: PRIVATE HEALTH INSURANCE | Attending: Neurosurgery

## 2024-06-24 DIAGNOSIS — R2681 Unsteadiness on feet: Secondary | ICD-10-CM | POA: Insufficient documentation

## 2024-06-24 DIAGNOSIS — R262 Difficulty in walking, not elsewhere classified: Secondary | ICD-10-CM | POA: Insufficient documentation

## 2024-06-24 DIAGNOSIS — M6281 Muscle weakness (generalized): Secondary | ICD-10-CM | POA: Insufficient documentation

## 2024-06-24 DIAGNOSIS — M5459 Other low back pain: Secondary | ICD-10-CM | POA: Diagnosis present

## 2024-06-24 DIAGNOSIS — R293 Abnormal posture: Secondary | ICD-10-CM | POA: Insufficient documentation

## 2024-06-24 DIAGNOSIS — R2689 Other abnormalities of gait and mobility: Secondary | ICD-10-CM | POA: Insufficient documentation

## 2024-06-24 NOTE — Therapy (Signed)
 OUTPATIENT PHYSICAL THERAPY NEURO TREATMENT   Patient Name: Mary Holt MRN: [id number] DOB:09/17/1961, 63 y.o., female Today's Date: 06/24/2024   PCP: [name], FNP REFERRING PROVIDER: Rockey Peru, MD  END OF SESSION:  PT End of Session - 06/24/24 1100     Visit Number 9    Number of Visits 10    Date for PT Re-Evaluation 07/08/24    Authorization Type [employer]    PT Start Time 1102    PT Stop Time 1145    PT Time Calculation (min) 43 min    Equipment Utilized During Treatment Gait belt    Activity Tolerance Patient tolerated treatment well    Behavior During Therapy WFL for tasks assessed/performed            Past Medical History:  Diagnosis Date   Angina pectoris (HCC) 02/22/2020   CAD in native artery 02/22/2020   Chest pain of uncertain etiology 01/06/2020   Diabetes mellitus without complication (HCC)    Heart murmur    asymptomatic   Hyperlipidemia    Hypertension    Kidney stones    Kidney stones    several times, last  one 2014   Mitral valve prolapse    MVP (mitral valve prolapse)    Pure hypercholesterolemia 05/02/2020   Snoring 01/06/2020   Past Surgical History:  Procedure Laterality Date   BACK SURGERY     x2   BREAST SURGERY     cyst right breast   CARDIAC CATHETERIZATION     CARPAL TUNNEL RELEASE Bilateral    CHOLECYSTECTOMY     CORONARY STENT INTERVENTION N/A 02/26/2020   Procedure: CORONARY STENT INTERVENTION;  Surgeon: Anner Alm ORN, MD;  Location: [facility] INVASIVE CV LAB;  Service: Cardiovascular;  Laterality: N/A;   DILATATION & CURRETTAGE/HYSTEROSCOPY WITH RESECTOCOPE N/A 08/11/2013   Procedure: DILATATION & CURETTAGE/HYSTEROSCOPY WITH RESECTOCOPE;  Surgeon: Dickie DELENA Carder, MD;  Location: [facility] ORS;  Service: Gynecology;  Laterality: N/A;   LEFT HEART CATH AND CORONARY ANGIOGRAPHY N/A 02/26/2020   Procedure: LEFT HEART CATH AND CORONARY ANGIOGRAPHY;  Surgeon: Anner Alm ORN, MD;  Location: [facility] INVASIVE CV LAB;  Service:  Cardiovascular;  Laterality: N/A;   LEFT HEART CATH AND CORONARY ANGIOGRAPHY N/A 11/25/2023   Procedure: LEFT HEART CATH AND CORONARY ANGIOGRAPHY;  Surgeon: [place], Peter M, MD;  Location: [facility] INVASIVE CV LAB;  Service: Cardiovascular;  Laterality: N/A;   LUMBAR LAMINECTOMY/DECOMPRESSION MICRODISCECTOMY N/A 04/08/2024   Procedure: LUMBAR LAMINECTOMY/DECOMPRESSION MICRODISCECTOMY LUMBAR THREE-FOUR;  Surgeon: Peru Rockey, MD;  Location: [facility] OR;  Service: Neurosurgery;  Laterality: N/A;   TONSILLECTOMY     TRIGGER FINGER RELEASE     left thumb   TUBAL LIGATION     Patient Active Problem List   Diagnosis Date Noted   Encounter for general adult medical examination w/o abnormal findings 06/18/2024   Lumbar stenosis with neurogenic claudication 04/08/2024   Diabetes mellitus (HCC) 02/21/2024   COVID-19 vaccination declined 02/21/2024   Class 1 obesity due to excess calories with serious comorbidity and body mass index (BMI) of 30.0 to 30.9 in adult 02/21/2024   Sleep apnea, unspecified 07/27/2021   Pure hypercholesterolemia 05/02/2020   Abnormal cardiac CT angiography 02/26/2020   Angina pectoris (HCC) 02/22/2020   Coronary artery disease involving native coronary artery of native heart with angina pectoris (HCC) 02/22/2020   Chest pain of uncertain etiology 01/06/2020   Snoring 01/06/2020   Mitral valve prolapse 01/06/2020   History of mitral valve prolapse 12/07/2019   Rash  02/02/2019   Loss of hair 02/02/2019   Unilateral primary osteoarthritis, left knee 10/28/2017   Chronic pain of left knee 09/30/2017   BMI 31.0-31.9,adult 10/02/2014   Hypertensive heart disease without heart failure 10/02/2014    ONSET DATE: 04/10/24 referral  REFERRING DIAG:  F51.937 (ICD-10-CM) - Lumbar stenosis with neurogenic claudication  Z98.890 (ICD-10-CM) - S/P lumbar laminectomy    THERAPY DIAG:  Muscle weakness (generalized)  Abnormal posture  Other abnormalities of gait and mobility  Other  low back pain  Difficulty in walking, not elsewhere classified  Unsteadiness on feet  Rationale for Evaluation and Treatment: Rehabilitation  SUBJECTIVE:                                                                                                                                                                                             SUBJECTIVE STATEMENT: Patient reports she doing a lot better. Was a little sore after last session but it subsided after a day; no increase in pain. Pt states she has not completed HEP since last week because she's busy running errands. Pt feels as though she is ready to be discharged next week.   Pt accompanied by: self  PERTINENT HISTORY: angina, CAD, DM, heart murmur, HLD, HTN, kidney stones, MVP, h/o multiple back sx  PAIN:  Are you having pain? No  PRECAUTIONS: Fall   PATIENT GOALS: to strengthen my muscles  OBJECTIVE:  Note: Objective measures were completed at Evaluation unless otherwise noted.  DIAGNOSTIC FINDINGS: 01/19/24 CT lumbar  IMPRESSION: 1. No fracture or acute findings. 2. Intact L4-L5 posterior fusion hardware. 3. Multilevel degenerative change with spinal canal and neural foraminal stenosis as described. 4. Bilateral intrarenal calculi. Aortic Atherosclerosis (ICD10-I70.0).                                                                                                                              TREATMENT: Therapeutic Activity:  SciFit multi-peaks level 9.0 for 10 minutes using BUE/BLEs for neural priming for reciprocal movement, dynamic cardiovascular warmup and increased amplitude of stepping (RPE: 7/10)  Therex: Hip hinges with two 8# DBs - 12 reps (RPE: 7/10)  Pt exhibited great carryover with PT instruction, demonstration, and verbal cues given in previous treatment session for proper form and technique Pt reported no increase in LBP Deadbugs, moving opposite arm, opposite leg - 8 reps on each arm/leg  (RPE: 7/10) Verbal and tactile cues required to stabilize hips with good carryover Pt was able to maintain neutral spinal and core activation throughout Modified plank, resting on forearms and knees - 2x 1 min holds (RPE: 8/10) PT provided instructions and demonstration prior and pt was able to perform with proper form and technique Squat to 10# ball pickup then ball slam - 2x 8 reps > 10 reps (RPE: 7/10) Pt slightly challenged with multi-step task sequencing but improved with increased reps   PATIENT EDUCATION: Education details: see above, updated HEP, d/c planning  Person educated: Patient Education method: Explanation, Demonstration, Verbal cues, and Handouts Education comprehension: verbalized understanding, returned demonstration, verbal cues required, and needs further education  HOME EXERCISE PROGRAM: Access Code: [email or web address] URL: [email or web address] Date: 04/27/2024 Prepared by: Delon Pop  Exercises - Clamshell  - 1 x daily - 7 x weekly - 3 sets - 10 reps - Supine March  - 1 x daily - 7 x weekly - 3 sets - 10 reps - Sit to Stand  - 1 x daily - 7 x weekly - 3 sets - 10 reps - Heel Raises with Counter Support  - 1 x daily - 7 x weekly - 3 sets - 10 reps - Standing Hip Extension with Counter Support  - 1 x daily - 7 x weekly - 3 sets - 10 reps - Standing March with Counter Support  - 1 x daily - 7 x weekly - 3 sets - 10 reps - Cat Cow  - 1 x daily - 7 x weekly - 3 sets - 10 reps - Quadruped Thread the Needle  - 1 x daily - 7 x weekly - 3 sets - 10 reps  Verbally added seated DL w/red theraband on 3/88  Verbally added standing paloff press w/green theraband on 6/25  Verbally added modified plank on knees on 7/2  GOALS: Goals reviewed with patient? Yes  STG = LTG based on PT POC length   LONG TERM GOALS: Target date: 05/29/24  Pt will be independent with final HEP for improved functional strength  Baseline: to be updated Goal status: IN PROGRESS  2.   Patient will improve Oswestry score to </=16 to demonstrate a reduction in her disability due to back pain Baseline: 23; 19/50 (6/11) Goal status: NOT MET   NEW LONG TERM GOALS FOR EXTENDED POC:  Target date: 07/01/2024  Pt will be independent with final HEP for improved functional strength  Baseline:  Goal status: IN PROGRESS  2.  Pt will lift 10# object off ground w/proper body mechanics and no report of pain for improved functional mobility and work simulation  Baseline:  Goal status: INITIAL  3.  Patient will improve Oswestry score to </=13 to demonstrate a reduction in her disability due to back pain Baseline: 23; 19/50 (6/11) Goal status: INITIAL  ASSESSMENT:  CLINICAL IMPRESSION: Pt was seen for skilled PT treatment session with an emphasis on continued core engagement/stabilization and progression of functional bilat LE muscular strengthening and endurance. Pt continues to progress towards all goals set at the initial evaluation and feels she is ready to d/c next session. Pt highly benefits from PT verbal instructions and  demonstration prior to engaging in therex. Pt continues to be slightly challenged with multi-step sequencing however improves with increased reps. Pt HEP compliance is also rather poor and PT discussed the importance of completing it, even if it's 2x/week, especially upon d/c; pt verbalized understanding. Pt tolerated session well with no increase in pain throughout the entirety of the session and put forth great effort. Continue POC.   OBJECTIVE IMPAIRMENTS: Abnormal gait, decreased knowledge of condition, decreased knowledge of use of DME, decreased mobility, difficulty walking, decreased strength, improper body mechanics, postural dysfunction, and pain.   ACTIVITY LIMITATIONS: carrying, lifting, bending, sitting, standing, bed mobility, hygiene/grooming, locomotion level, and caring for others  PARTICIPATION LIMITATIONS: meal prep, cleaning, interpersonal  relationship, driving, shopping, community activity, and occupation  PERSONAL FACTORS: Age, Past/current experiences, Profession, and Social background are also affecting patient's functional outcome.   REHAB POTENTIAL: Good  CLINICAL DECISION MAKING: Stable/uncomplicated  EVALUATION COMPLEXITY: Low  PLAN:  PT FREQUENCY: 1x/week  PT DURATION: 4 weeks + 4 weeks (recert)  PLANNED INTERVENTIONS: 02835- PT Re-evaluation, 97750- Physical Performance Testing, 97110-Therapeutic exercises, 97530- Therapeutic activity, [id number]- Neuromuscular re-education, 97535- Self Care, 02859- Manual therapy, [phone]- Gait training, [phone]- Aquatic Therapy, [phone]- Electrical stimulation (manual), Patient/Family education, Balance training, Stair training, Taping, Scar mobilization, Vestibular training, Visual/preceptual remediation/compensation, DME instructions, Cryotherapy, and Moist heat  PLAN FOR NEXT SESSION: reassess goals and possibly d/c?; if not, continue with functional bilat LE strengthening, incorporate SLS therex, core stability progression posterior chain strength; squats, deadlift, incorporate rebounder?, farmer's carry   [employer], Student-PT Delon DELENA Pop, PT, DPT, CBIS  06/24/2024, 1:39 PM

## 2024-06-25 ENCOUNTER — Other Ambulatory Visit: Payer: Self-pay | Admitting: Nurse Practitioner

## 2024-06-25 DIAGNOSIS — E1159 Type 2 diabetes mellitus with other circulatory complications: Secondary | ICD-10-CM

## 2024-06-25 MED ORDER — SEMAGLUTIDE (1 MG/DOSE) 4 MG/3ML ~~LOC~~ SOPN: 1.0000 mg | PEN_INJECTOR | SUBCUTANEOUS | 2 refills | Status: DC

## 2024-06-28 ENCOUNTER — Ambulatory Visit: Payer: Self-pay | Admitting: Nurse Practitioner

## 2024-06-30 ENCOUNTER — Ambulatory Visit: Payer: PRIVATE HEALTH INSURANCE | Admitting: Physical Therapy

## 2024-06-30 DIAGNOSIS — R293 Abnormal posture: Secondary | ICD-10-CM

## 2024-06-30 DIAGNOSIS — M6281 Muscle weakness (generalized): Secondary | ICD-10-CM

## 2024-06-30 DIAGNOSIS — R2689 Other abnormalities of gait and mobility: Secondary | ICD-10-CM

## 2024-06-30 DIAGNOSIS — M5459 Other low back pain: Secondary | ICD-10-CM

## 2024-06-30 NOTE — Therapy (Signed)
 OUTPATIENT PHYSICAL THERAPY NEURO TREATMENT - DISCHARGE SUMMARY    Patient Name: Mary Holt MRN: [id number] DOB:04/19/1961, 63 y.o., female Today's Date: 06/30/2024   PCP: [name], FNP REFERRING PROVIDER: Rockey Peru, MD  PHYSICAL THERAPY DISCHARGE SUMMARY  Visits from Start of Care: 10  Current functional level related to goals / functional outcomes: Independent w/all mobility w/proper lifting technique of 10# from ground    Remaining deficits: Occasional low back pain   Education / Equipment: HEP   Patient agrees to discharge. Patient goals were met. Patient is being discharged due to being pleased with the current functional level.   END OF SESSION:  PT End of Session - 06/30/24 1103     Visit Number 10    Number of Visits 10    Date for PT Re-Evaluation 07/08/24    Authorization Type [employer]    PT Start Time 1101    PT Stop Time 1140    PT Time Calculation (min) 39 min    Equipment Utilized During Treatment --    Activity Tolerance Patient tolerated treatment well    Behavior During Therapy WFL for tasks assessed/performed             Past Medical History:  Diagnosis Date   Angina pectoris (HCC) 02/22/2020   CAD in native artery 02/22/2020   Chest pain of uncertain etiology 01/06/2020   Diabetes mellitus without complication (HCC)    Heart murmur    asymptomatic   Hyperlipidemia    Hypertension    Kidney stones    Kidney stones    several times, last  one 2014   Mitral valve prolapse    MVP (mitral valve prolapse)    Pure hypercholesterolemia 05/02/2020   Snoring 01/06/2020   Past Surgical History:  Procedure Laterality Date   BACK SURGERY     x2   BREAST SURGERY     cyst right breast   CARDIAC CATHETERIZATION     CARPAL TUNNEL RELEASE Bilateral    CHOLECYSTECTOMY     CORONARY STENT INTERVENTION N/A 02/26/2020   Procedure: CORONARY STENT INTERVENTION;  Surgeon: Anner Alm ORN, MD;  Location: [facility] INVASIVE CV LAB;  Service:  Cardiovascular;  Laterality: N/A;   DILATATION & CURRETTAGE/HYSTEROSCOPY WITH RESECTOCOPE N/A 08/11/2013   Procedure: DILATATION & CURETTAGE/HYSTEROSCOPY WITH RESECTOCOPE;  Surgeon: Dickie DELENA Carder, MD;  Location: [facility];  Service: Gynecology;  Laterality: N/A;   LEFT HEART CATH AND CORONARY ANGIOGRAPHY N/A 02/26/2020   Procedure: LEFT HEART CATH AND CORONARY ANGIOGRAPHY;  Surgeon: Anner Alm ORN, MD;  Location: [facility] INVASIVE CV LAB;  Service: Cardiovascular;  Laterality: N/A;   LEFT HEART CATH AND CORONARY ANGIOGRAPHY N/A 11/25/2023   Procedure: LEFT HEART CATH AND CORONARY ANGIOGRAPHY;  Surgeon: [place], Peter M, MD;  Location: [facility] INVASIVE CV LAB;  Service: Cardiovascular;  Laterality: N/A;   LUMBAR LAMINECTOMY/DECOMPRESSION MICRODISCECTOMY N/A 04/08/2024   Procedure: LUMBAR LAMINECTOMY/DECOMPRESSION MICRODISCECTOMY LUMBAR THREE-FOUR;  Surgeon: Peru Rockey, MD;  Location: [facility] OR;  Service: Neurosurgery;  Laterality: N/A;   TONSILLECTOMY     TRIGGER FINGER RELEASE     left thumb   TUBAL LIGATION     Patient Active Problem List   Diagnosis Date Noted   Encounter for general adult medical examination w/o abnormal findings 06/18/2024   Lumbar stenosis with neurogenic claudication 04/08/2024   Diabetes mellitus (HCC) 02/21/2024   COVID-19 vaccination declined 02/21/2024   Class 1 obesity due to excess calories with serious comorbidity and body mass index (BMI) of 30.0  to 30.9 in adult 02/21/2024   Sleep apnea, unspecified 07/27/2021   Pure hypercholesterolemia 05/02/2020   Abnormal cardiac CT angiography 02/26/2020   Angina pectoris (HCC) 02/22/2020   Coronary artery disease involving native coronary artery of native heart with angina pectoris (HCC) 02/22/2020   Chest pain of uncertain etiology 01/06/2020   Snoring 01/06/2020   Mitral valve prolapse 01/06/2020   History of mitral valve prolapse 12/07/2019   Rash 02/02/2019   Loss of hair 02/02/2019   Unilateral primary  osteoarthritis, left knee 10/28/2017   Chronic pain of left knee 09/30/2017   BMI 31.0-31.9,adult 10/02/2014   Hypertensive heart disease without heart failure 10/02/2014    ONSET DATE: 04/10/24 referral  REFERRING DIAG:  F51.937 (ICD-10-CM) - Lumbar stenosis with neurogenic claudication  Z98.890 (ICD-10-CM) - S/P lumbar laminectomy    THERAPY DIAG:  Muscle weakness (generalized)  Abnormal posture  Other abnormalities of gait and mobility  Other low back pain  Rationale for Evaluation and Treatment: Rehabilitation  SUBJECTIVE:                                                                                                                                                                                             SUBJECTIVE STATEMENT: Patient reports doing well. Feels ready to DC today and is sure she will return to work once she sees her doctor on the 21st. Denies pain today. States she occasionally gets cramping in her upper thighs but overall is doing well.   Pt accompanied by: self  PERTINENT HISTORY: angina, CAD, DM, heart murmur, HLD, HTN, kidney stones, MVP, h/o multiple back sx  PAIN:  Are you having pain? No  PRECAUTIONS: Fall   PATIENT GOALS: to strengthen my muscles  OBJECTIVE:  Note: Objective measures were completed at Evaluation unless otherwise noted.  DIAGNOSTIC FINDINGS: 01/19/24 CT lumbar  IMPRESSION: 1. No fracture or acute findings. 2. Intact L4-L5 posterior fusion hardware. 3. Multilevel degenerative change with spinal canal and neural foraminal stenosis as described. 4. Bilateral intrarenal calculi. Aortic Atherosclerosis (ICD10-I70.0).  TREATMENT: Therapeutic Activity - LTG Assessment   PATIENT SURVEYS:  Modified Oswestry:  MODIFIED OSWESTRY DISABILITY SCALE  Date: 06/30/24 Score  Pain intensity 0 = I can tolerate  the pain I have without having to use pain medication.  2. Personal care (washing, dressing, etc.) 0 =  I can take care of myself normally without causing increased pain.  3. Lifting 3 = Pain prevents me from lifting heavy weights, but I can manage (5) I have hardly any social life because of my pain. light to medium weights if they are conveniently positioned  4. Walking 1 = Pain prevents me from walking more than 1 mile.  5. Sitting 1 =  I can only sit in my favorite chair as long as I like.  6. Standing 1 =  I can stand as long as I want but, it increases my pain.  7. Sleeping 0 = Pain does not prevent me from sleeping well.  8. Social Life 2 = Pain prevents me from participating in more energetic activities (eg. sports, dancing).  9. Traveling 0 =  I can travel anywhere without increased pain.  10. Employment/ Homemaking 1 = My normal homemaking/job activities increase my pain, but I can still perform all that is required of me  Total 9/50   Interpretation of scores: Score Category Description  0-20% Minimal Disability The patient can cope with most living activities. Usually no treatment is indicated apart from advice on lifting, sitting and exercise  21-40% Moderate Disability The patient experiences more pain and difficulty with sitting, lifting and standing. Travel and social life are more difficult and they may be disabled from work. Personal care, sexual activity and sleeping are not grossly affected, and the patient can usually be managed by conservative means  41-60% Severe Disability Pain remains the main problem in this group, but activities of daily living are affected. These patients require a detailed investigation  61-80% Crippled Back pain impinges on all aspects of the patient's life. Positive intervention is required  81-100% Bed-bound  These patients are either bed-bound or exaggerating their symptoms  Bluford FORBES Zoe DELENA Karon DELENA, et al. Surgery versus conservative  management of stable thoracolumbar fracture: the PRESTO feasibility RCT. [place]): [employer]; 2021 Nov. [facility] Technology Assessment, No. 25.62.) Appendix 3, Oswestry Disability Index category descriptors. Available from: FindJewel[employer].cz  Minimally Clinically Important Difference (MCID) = 12.8%  Per pt request, NuStep level 4 for 10 minutes using BUE/BLEs for neural priming for reciprocal movement, dynamic cardiovascular warmup and increased amplitude of stepping. RPE of 3/10 following activity  Practiced lifting 10# milk crate from floor and placing on mat then lifting from mat and placing on floor for improved body mechanics while lifting and work simulation. Pt performed well w/good carryover of proper technique from previous sessions noted. Then had pt carry 10# milk crate x115' around gym and pt able to do so without issue.   Slam balls w/rotation to wall using 10# slam ball, x10 reps per side, for improved stability w/high amplitude movement, core stability w/rotation and work simulation. Pt most challenged when rotating to L side, but no LOB noted or pain reported.  Farmer's carries using 15# KB, 2x115' per arm, for improved anti-rotation strength and work simulation. Pt performed well w/no pain reported   Alt fwd lunge w/rotation, x8 reps per side w/CGA. Noted continued decreased rotation to L side but no LOB noted            PATIENT EDUCATION:  Education details: Importance of doing HEP, goal results, education on how to return to PT in future if mobility needs change  Person educated: Patient Education method: Medical illustrator Education comprehension: verbalized understanding  HOME EXERCISE PROGRAM: Access Code: [email or web address] URL: [email or web address] Date: 04/27/2024 Prepared by: Delon Pop  Exercises - Clamshell  - 1 x daily - 7 x weekly - 3 sets - 10 reps - Supine March  - 1 x daily - 7 x weekly - 3  sets - 10 reps - Sit to Stand  - 1 x daily - 7 x weekly - 3 sets - 10 reps - Heel Raises with Counter Support  - 1 x daily - 7 x weekly - 3 sets - 10 reps - Standing Hip Extension with Counter Support  - 1 x daily - 7 x weekly - 3 sets - 10 reps - Standing March with Counter Support  - 1 x daily - 7 x weekly - 3 sets - 10 reps - Cat Cow  - 1 x daily - 7 x weekly - 3 sets - 10 reps - Quadruped Thread the Needle  - 1 x daily - 7 x weekly - 3 sets - 10 reps  Verbally added seated DL w/red theraband on 3/88  Verbally added standing paloff press w/green theraband on 6/25  Verbally added modified plank on knees on 7/2  GOALS: Goals reviewed with patient? Yes  STG = LTG based on PT POC length   LONG TERM GOALS: Target date: 05/29/24  Pt will be independent with final HEP for improved functional strength  Baseline: to be updated Goal status: IN PROGRESS  2.  Patient will improve Oswestry score to </=16 to demonstrate a reduction in her disability due to back pain Baseline: 23; 19/50 (6/11) Goal status: NOT MET   NEW LONG TERM GOALS FOR EXTENDED POC:  Target date: 07/01/2024  Pt will be independent with final HEP for improved functional strength  Baseline:  Goal status: MET  2.  Pt will lift 10# object off ground w/proper body mechanics and no report of pain for improved functional mobility and work simulation  Baseline:  Goal status: MET  3.  Patient will improve Oswestry score to </=13 to demonstrate a reduction in her disability due to back pain Baseline: 23; 19/50 (6/11); 9/50 (7/8)  Goal status: MET  ASSESSMENT:  CLINICAL IMPRESSION: Emphasis of skilled PT session on LTG assessment and DC from PT. Pt has met 3 of 3 goals, improving her score on Modified Oswestry to 9/50, indicative of minimal disability due to back pain. Pt reports independence w/final HEP but is not compliant, stating she is often too busy to regularly perform them but will work on it. Encouraged pt to perform  exercises throughout the day to better fit her schedule. Pt demonstrated good lifting technique of 10# milk crate this date w/no pain reported and states she is ready to return to work and DC today.   OBJECTIVE IMPAIRMENTS: Abnormal gait, decreased knowledge of condition, decreased knowledge of use of DME, decreased mobility, difficulty walking, decreased strength, improper body mechanics, postural dysfunction, and pain.   ACTIVITY LIMITATIONS: carrying, lifting, bending, sitting, standing, bed mobility, hygiene/grooming, locomotion level, and caring for others  PARTICIPATION LIMITATIONS: meal prep, cleaning, interpersonal relationship, driving, shopping, community activity, and occupation  PERSONAL FACTORS: Age, Past/current experiences, Profession, and Social background are also affecting patient's functional outcome.   REHAB POTENTIAL: Good  CLINICAL DECISION MAKING: Stable/uncomplicated  EVALUATION COMPLEXITY: Low  PLAN:  PT FREQUENCY: 1x/week  PT DURATION: 4 weeks + 4 weeks (recert)  PLANNED INTERVENTIONS: 02835- PT Re-evaluation, 97750- Physical Performance Testing, 97110-Therapeutic exercises, 97530- Therapeutic activity, 97112- Neuromuscular re-education, 97535- Self Care, 02859- Manual therapy, [phone]- Gait training, [phone]- Aquatic Therapy, [phone]- Electrical stimulation (manual), Patient/Family education, Balance training, Stair training, Taping, Scar mobilization, Vestibular training, Visual/preceptual remediation/compensation, DME instructions, Cryotherapy, and Moist heat    Kae Lauman E Tighe Gitto, PT, DPT  06/30/2024, 11:40 AM

## 2024-07-21 ENCOUNTER — Ambulatory Visit: Payer: PRIVATE HEALTH INSURANCE | Admitting: Cardiology

## 2024-07-22 ENCOUNTER — Ambulatory Visit: Payer: PRIVATE HEALTH INSURANCE | Admitting: Cardiology

## 2024-09-01 ENCOUNTER — Ambulatory Visit: Payer: Self-pay

## 2024-09-01 NOTE — Telephone Encounter (Signed)
 FYI Only or Action Required?: Action required by provider: requesting medication.  Patient was last seen in primary care on 06/18/2024 by Georgina Speaks, FNP.  Called Nurse Triage reporting Cough.  Symptoms began several days ago.  Interventions attempted: OTC medications: Nyquil and Dayquil and Rest, hydration, or home remedies.  Symptoms are: unchanged.  Triage Disposition: See Physician Within 24 Hours  Patient/caregiver understands and will follow disposition?: No, wishes to speak with PCP  Copied from CRM #[id number]. Topic: Clinical - Medication Question >> Sep 01, 2024  8:30 AM Myrick T wrote: Reason for CRM: patient called stated she has had a cough that she can't get rid of and the only that has helped her in the past was Benzonatate  100 capsules. Patient is requesting a script for the cough Reason for Disposition  SEVERE coughing spells (e.g., whooping sound after coughing, vomiting after coughing)  Answer Assessment - Initial Assessment Questions 1. ONSET: When did the cough begin?      Started this past Friday 2. SEVERITY: How bad is the cough today?      7 out of 10 3. SPUTUM: Describe the color of your sputum (e.g., none, dry cough; clear, white, yellow, green)     Clear but non productive at times 4. HEMOPTYSIS: Are you coughing up any blood? If Yes, ask: How much? (e.g., flecks, streaks, tablespoons, etc.)     no 5. DIFFICULTY BREATHING: Are you having difficulty breathing? If Yes, ask: How bad is it? (e.g., mild, moderate, severe)      no 6. FEVER: Do you have a fever? If Yes, ask: What is your temperature, how was it measured, and when did it start?     no 7. CARDIAC HISTORY: Do you have any history of heart disease? (e.g., heart attack, congestive heart failure)      Cardiac stents 8. LUNG HISTORY: Do you have any history of lung disease?  (e.g., pulmonary embolus, asthma, emphysema)     no 9. PE RISK FACTORS: Do you have a history of blood  clots? (or: recent major surgery, recent prolonged travel, bedridden)     no 10. OTHER SYMPTOMS: Do you have any other symptoms? (e.g., runny nose, wheezing, chest pain)       Runny nose 12. TRAVEL: Have you traveled out of the country in the last month? (e.g., travel history, exposures)       no  Protocols used: Cough - Acute Non-Productive-A-AH

## 2024-09-02 ENCOUNTER — Encounter: Payer: Self-pay | Admitting: Family Medicine

## 2024-09-02 ENCOUNTER — Ambulatory Visit (INDEPENDENT_AMBULATORY_CARE_PROVIDER_SITE_OTHER): Payer: PRIVATE HEALTH INSURANCE | Admitting: Family Medicine

## 2024-09-02 VITALS — BP 122/64 | HR 83 | Temp 98.5°F | Ht 63.0 in | Wt 167.0 lb

## 2024-09-02 DIAGNOSIS — R051 Acute cough: Secondary | ICD-10-CM

## 2024-09-02 DIAGNOSIS — R350 Frequency of micturition: Secondary | ICD-10-CM

## 2024-09-02 DIAGNOSIS — R0981 Nasal congestion: Secondary | ICD-10-CM

## 2024-09-02 LAB — POC COVID19 BINAXNOW: SARS Coronavirus 2 Ag: NEGATIVE

## 2024-09-02 MED ORDER — BENZONATATE 100 MG PO CAPS: 100.0000 mg | ORAL_CAPSULE | Freq: Three times a day (TID) | ORAL | 0 refills | Status: AC | PRN

## 2024-09-02 NOTE — Progress Notes (Signed)
 Mary Holt, CMA,acting as a Neurosurgeon for [employer], NP.,have documented all relevant documentation on the behalf of Mary Creighton, NP,as directed by  Mary Creighton, NP while in the presence of Mary Creighton, NP.  Subjective:  Patient ID: Mary Holt , female    DOB: April 13, 1961 , 63 y.o.   MRN: [id number]  Chief Complaint  Patient presents with   Cough    Patient presents today for a cough that started a few days ago. She reports she was feeling bad on Friday she had body aches but those are gone. She just now has a cough.     HPI Discussed the use of AI scribe software for clinical note transcription with the patient, who gave verbal consent to proceed.  History of Present Illness     Mary Holt is a 63 year old female who presents with a persistent cough and cold symptoms.  She began experiencing body aches, myalgia, and headaches late Friday, which worsened by Saturday and persisted through Sunday. By Monday, she felt slightly better but developed a persistent cough and nasal congestion.  The cough is described as 'nasty' and sometimes produces sputum. It is particularly bothersome when lying down, causing chest discomfort. She also reports rhinorrhea and frequent nose blowing. Despite the cough, restlessness rather than the cough itself has affected her sleep.  She has experienced increased urination over the weekend, with occasional incontinence upon waking. She has not checked her blood sugar during this period. Her appetite has been slightly affected, and she notes a diminished sense of taste and smell.  She has not been to work since falling ill but plans to return today. She has been managing her symptoms at home by staying hydrated. No wheezing or significant fever, though she felt slightly warm at times. She normally receives a flu shot annually.   Cough This is a new problem. The current episode started in the past 7 days. The problem has been waxing and waning.  The problem occurs every few minutes. The cough is Non-productive. Associated symptoms include postnasal drip. Pertinent negatives include no chest pain, fever, shortness of breath or wheezing. The symptoms are aggravated by lying down. She has tried rest and cool air for the symptoms. The treatment provided mild relief. Her past medical history is significant for environmental allergies.     Past Medical History:  Diagnosis Date   Angina pectoris (HCC) 02/22/2020   CAD in native artery 02/22/2020   Chest pain of uncertain etiology 01/06/2020   Diabetes mellitus without complication (HCC)    Heart murmur    asymptomatic   Hyperlipidemia    Hypertension    Kidney stones    Kidney stones    several times, last  one 2014   Mitral valve prolapse    MVP (mitral valve prolapse)    Pure hypercholesterolemia 05/02/2020   Snoring 01/06/2020     Family History  Problem Relation Age of Onset   Stroke Mother    Heart attack Father    Breast cancer Sister    Heart attack Brother    Ovarian cancer Sister    Lymphoma Sister    Heart attack Brother    Colon cancer Neg Hx    Colon polyps Neg Hx    Esophageal cancer Neg Hx    Rectal cancer Neg Hx    Stomach cancer Neg Hx      Current Outpatient Medications:    aspirin  EC 81 MG tablet, Take 1 tablet (  81 mg total) by mouth daily., Disp: 90 tablet, Rfl: 3   benzonatate  (TESSALON  PERLES) 100 MG capsule, Take 1 capsule (100 mg total) by mouth 3 (three) times daily as needed., Disp: 30 capsule, Rfl: 0   blood glucose meter kit and supplies KIT, Dispense based on patient and insurance preference. Use up to four times daily as directed., Disp: 1 each, Rfl: 0   Blood Glucose Monitoring Suppl DEVI, 1 each by Does not apply route in the morning, at noon, and at bedtime. May substitute to any manufacturer covered by patient's insurance., Disp: 1 each, Rfl: 0   diazepam  (VALIUM ) 5 MG tablet, Take 1 tablet (5 mg total) by mouth every 6 (six) hours as needed for  muscle spasms., Disp: 30 tablet, Rfl: 0   fenofibrate  (TRICOR ) 145 MG tablet, TAKE 1 TABLET(145 MG) BY MOUTH DAILY, Disp: 90 tablet, Rfl: 3   JARDIANCE  25 MG TABS tablet, TAKE 1 TABLET(25 MG) BY MOUTH DAILY BEFORE BREAKFAST, Disp: 90 tablet, Rfl: 1   nitroGLYCERIN  (NITROSTAT ) 0.4 MG SL tablet, Place 1 tablet (0.4 mg total) under the tongue every 5 (five) minutes as needed for chest pain., Disp: 20 tablet, Rfl: 3   olmesartan  (BENICAR ) 40 MG tablet, Take 1 tablet (40 mg total) by mouth daily., Disp: 90 tablet, Rfl: 1   oxyCODONE  (OXY IR/ROXICODONE ) 5 MG immediate release tablet, 1 or 2 tablets every 4 hours as needed for severe pain, Disp: 40 tablet, Rfl: 0   oxyCODONE -acetaminophen  (PERCOCET/ROXICET) 5-325 MG tablet, Take 1 tablet by mouth every 8 (eight) hours as needed for up to 9 doses for severe pain (pain score 7-10)., Disp: 9 tablet, Rfl: 0   rosuvastatin  (CRESTOR ) 40 MG tablet, TAKE 1 TABLET BY MOUTH  DAILY AT 6 PM, Disp: 90 tablet, Rfl: 3   Semaglutide , 1 MG/DOSE, 4 MG/3ML SOPN, Inject 1 mg into the skin once a week., Disp: 3 mL, Rfl: 2   Semaglutide ,0.25 or 0.5MG /DOS, (OZEMPIC , 0.25 OR 0.5 MG/DOSE,) 2 MG/1.5ML SOPN, Inject 0.5 mg into the skin once a week., Disp: 4.5 mL, Rfl: 1   tiZANidine  (ZANAFLEX ) 4 MG tablet, Take 0.5 tablets (2 mg total) by mouth every 8 (eight) hours as needed for up to 10 doses for muscle spasms., Disp: 5 tablet, Rfl: 0   Allergies  Allergen Reactions   Relafen [Nabumetone] Rash   Penicillins Rash     Review of Systems  Constitutional: Negative.  Negative for fever.  HENT:  Positive for postnasal drip.   Eyes: Negative.   Respiratory:  Positive for cough. Negative for shortness of breath and wheezing.   Cardiovascular: Negative.  Negative for chest pain.  Musculoskeletal: Negative.   Skin: Negative.   Allergic/Immunologic: Positive for environmental allergies.  Psychiatric/Behavioral: Negative.       Today's Vitals   09/02/24 0857  BP: 122/64  Pulse:  83  Temp: 98.5 F (36.9 C)  TempSrc: Oral  Weight: 167 lb (75.8 kg)  Height: 5' 3 (1.6 m)  PainSc: 0-No pain   Body mass index is 29.58 kg/m.  Wt Readings from Last 3 Encounters:  09/02/24 167 lb (75.8 kg)  06/18/24 166 lb (75.3 kg)  04/08/24 164 lb (74.4 kg)    The 10-year ASCVD risk score (Arnett DK, et al., 2019) is: 13%   Values used to calculate the score:     Age: 56 years     Clincally relevant sex: Female     Is Non-Hispanic African American: Yes     Diabetic:  Yes     Tobacco smoker: No     Systolic Blood Pressure: 122 mmHg     Is BP treated: Yes     HDL Cholesterol: 51 mg/dL     Total Cholesterol: 148 mg/dL  Objective:  Physical Exam HENT:     Head: Normocephalic.  Cardiovascular:     Rate and Rhythm: Normal rate and regular rhythm.  Pulmonary:     Effort: Pulmonary effort is normal.     Breath sounds: Normal breath sounds.  Skin:    General: Skin is warm and dry.  Neurological:     General: No focal deficit present.     Mental Status: She is alert and oriented to person, place, and time.         Assessment And Plan:  Acute cough -     Benzonatate ; Take 1 capsule (100 mg total) by mouth 3 (three) times daily as needed.  Dispense: 30 capsule; Refill: 0 -     POC COVID-19 BinaxNow  Nasal congestion -     POC COVID-19 BinaxNow    Return if symptoms worsen or fail to improve.  Patient was given opportunity to ask questions. Patient verbalized understanding of the plan and was able to repeat key elements of the plan. All questions were answered to their satisfaction.    I, Mary Creighton, NP, have reviewed all documentation for this visit. The documentation on 09/09/2024 for the exam, diagnosis, procedures, and orders are all accurate and complete.   IF YOU HAVE BEEN REFERRED TO A SPECIALIST, IT MAY TAKE 1-2 WEEKS TO SCHEDULE/PROCESS THE REFERRAL. IF YOU HAVE NOT HEARD FROM US /SPECIALIST IN TWO WEEKS, PLEASE GIVE US  A CALL AT [phone] X [phone].

## 2024-09-09 DIAGNOSIS — R0981 Nasal congestion: Secondary | ICD-10-CM | POA: Insufficient documentation

## 2024-09-09 DIAGNOSIS — R051 Acute cough: Secondary | ICD-10-CM | POA: Insufficient documentation

## 2024-09-16 LAB — LIPID PANEL
Chol/HDL Ratio: 2.7 ratio (ref 0.0–4.4)
Cholesterol, Total: 131 mg/dL (ref 100–199)
HDL: 49 mg/dL (ref >39)
LDL Chol Calc (NIH): 71 mg/dL (ref 0–99)
Triglycerides: 49 mg/dL (ref 0–149)
VLDL Cholesterol Cal: 11 mg/dL (ref 5–40)

## 2024-09-17 ENCOUNTER — Encounter: Payer: Self-pay | Admitting: Cardiology

## 2024-09-17 ENCOUNTER — Ambulatory Visit: Payer: PRIVATE HEALTH INSURANCE | Attending: Cardiology | Admitting: Cardiology

## 2024-09-17 VITALS — BP 132/80 | HR 76 | Ht 63.0 in | Wt 165.4 lb

## 2024-09-17 DIAGNOSIS — I2510 Atherosclerotic heart disease of native coronary artery without angina pectoris: Secondary | ICD-10-CM

## 2024-09-17 DIAGNOSIS — I10 Essential (primary) hypertension: Secondary | ICD-10-CM

## 2024-09-17 DIAGNOSIS — E782 Mixed hyperlipidemia: Secondary | ICD-10-CM

## 2024-09-17 DIAGNOSIS — E1100 Type 2 diabetes mellitus with hyperosmolarity without nonketotic hyperglycemic-hyperosmolar coma (NKHHC): Secondary | ICD-10-CM

## 2024-09-17 MED ORDER — EZETIMIBE 10 MG PO TABS: 10.0000 mg | ORAL_TABLET | Freq: Every day | ORAL | 3 refills | Status: AC

## 2024-09-17 NOTE — Patient Instructions (Signed)
 Medication Instructions:  Your physician has recommended you make the following change in your medication:  START: Zetia  10 mg once daily  *If you need a refill on your cardiac medications before your next appointment, please call your pharmacy*  Lab Work: In 12 weeks: Lipids, Lp(a) If you have labs (blood work) drawn today and your tests are completely normal, you will receive your results only by: MyChart Message (if you have MyChart) OR A paper copy in the mail If you have any lab test that is abnormal or we need to change your treatment, we will call you to review the results.  Follow-Up: At [facility], you and your health needs are our priority.  As part of our continuing mission to provide you with exceptional heart care, our providers are all part of one team.  This team includes your primary Cardiologist (physician) and Advanced Practice Providers or APPs (Physician Assistants and Nurse Practitioners) who all work together to provide you with the care you need, when you need it.  Your next appointment:   1 year(s)  Provider:   Kardie Tobb, DO

## 2024-09-17 NOTE — Progress Notes (Signed)
 Cardiology Office Note:    Date:  09/19/2024   ID:  Mary Holt, DOB 01-Jan-1961, MRN [id number]  PCP:  Georgina Speaks, FNP  Cardiologist:  Keeghan Bialy, DO  Electrophysiologist:  None   Referring MD: Georgina Speaks, FNP    I am ok  History of Present Illness:    Mary Holt is a 63 y.o. female with a hx of coronary artery disease status post DES to the diagonal in the proximal to mid circumflex artery, diabetes most recent hemoglobin A1c 8.9, hyperlipidemia, hypertension, noted mitral valve prolapse here today for follow-up visit.  At her last visit we discussed her Cardiac PET scan which was abnormal. Underwent LHC with no indication for PCI.   She is on Crestor  40 mg, with total cholesterol decreased from 148 to 131, triglycerides at 49, and LDL decreased from 84 to 71. She experiences occasional chest pain, less severe than before, and uses nitroglycerin  as needed. She experienced dizziness once after taking nitroglycerin , but it has not recurred in several months.    Past Medical History:  Diagnosis Date   Angina pectoris 02/22/2020   CAD in native artery 02/22/2020   Chest pain of uncertain etiology 01/06/2020   Diabetes mellitus without complication (HCC)    Heart murmur    asymptomatic   Hyperlipidemia    Hypertension    Kidney stones    Kidney stones    several times, last  one 2014   Mitral valve prolapse    MVP (mitral valve prolapse)    Pure hypercholesterolemia 05/02/2020   Snoring 01/06/2020    Past Surgical History:  Procedure Laterality Date   BACK SURGERY     x2   BREAST SURGERY     cyst right breast   CARDIAC CATHETERIZATION     CARPAL TUNNEL RELEASE Bilateral    CHOLECYSTECTOMY     CORONARY STENT INTERVENTION N/A 02/26/2020   Procedure: CORONARY STENT INTERVENTION;  Surgeon: Anner Alm ORN, MD;  Location: [facility] INVASIVE CV LAB;  Service: Cardiovascular;  Laterality: N/A;   DILATATION & CURRETTAGE/HYSTEROSCOPY WITH RESECTOCOPE N/A 08/11/2013    Procedure: DILATATION & CURETTAGE/HYSTEROSCOPY WITH RESECTOCOPE;  Surgeon: Dickie DELENA Carder, MD;  Location: [facility] ORS;  Service: Gynecology;  Laterality: N/A;   LEFT HEART CATH AND CORONARY ANGIOGRAPHY N/A 02/26/2020   Procedure: LEFT HEART CATH AND CORONARY ANGIOGRAPHY;  Surgeon: Anner Alm ORN, MD;  Location: [facility] INVASIVE CV LAB;  Service: Cardiovascular;  Laterality: N/A;   LEFT HEART CATH AND CORONARY ANGIOGRAPHY N/A 11/25/2023   Procedure: LEFT HEART CATH AND CORONARY ANGIOGRAPHY;  Surgeon: Swazil[place], Peter M, MD;  Location: [facility] INVASIVE CV LAB;  Service: Cardiovascular;  Laterality: N/A;   LUMBAR LAMINECTOMY/DECOMPRESSION MICRODISCECTOMY N/A 04/08/2024   Procedure: LUMBAR LAMINECTOMY/DECOMPRESSION MICRODISCECTOMY LUMBAR THREE-FOUR;  Surgeon: Gillie Duncans, MD;  Location: [facility] OR;  Service: Neurosurgery;  Laterality: N/A;   TONSILLECTOMY     TRIGGER FINGER RELEASE     left thumb   TUBAL LIGATION      Current Medications: Current Meds  Medication Sig   aspirin  EC 81 MG tablet Take 1 tablet (81 mg total) by mouth daily.   benzonatate  (TESSALON  PERLES) 100 MG capsule Take 1 capsule (100 mg total) by mouth 3 (three) times daily as needed.   blood glucose meter kit and supplies KIT Dispense based on patient and insurance preference. Use up to four times daily as directed.   Blood Glucose Monitoring Suppl DEVI 1 each by Does not apply route in the morning, at  noon, and at bedtime. May substitute to any manufacturer covered by patient's insurance.   diazepam  (VALIUM ) 5 MG tablet Take 1 tablet (5 mg total) by mouth every 6 (six) hours as needed for muscle spasms.   ezetimibe  (ZETIA ) 10 MG tablet Take 1 tablet (10 mg total) by mouth daily.   fenofibrate  (TRICOR ) 145 MG tablet TAKE 1 TABLET(145 MG) BY MOUTH DAILY   JARDIANCE  25 MG TABS tablet TAKE 1 TABLET(25 MG) BY MOUTH DAILY BEFORE BREAKFAST   nitroGLYCERIN  (NITROSTAT ) 0.4 MG SL tablet Place 1 tablet (0.4 mg total) under the tongue every 5 (five)  minutes as needed for chest pain.   olmesartan  (BENICAR ) 40 MG tablet Take 1 tablet (40 mg total) by mouth daily.   oxyCODONE  (OXY IR/ROXICODONE ) 5 MG immediate release tablet 1 or 2 tablets every 4 hours as needed for severe pain   oxyCODONE -acetaminophen  (PERCOCET/ROXICET) 5-325 MG tablet Take 1 tablet by mouth every 8 (eight) hours as needed for up to 9 doses for severe pain (pain score 7-10).   rosuvastatin  (CRESTOR ) 40 MG tablet TAKE 1 TABLET BY MOUTH  DAILY AT 6 PM   Semaglutide , 1 MG/DOSE, 4 MG/3ML SOPN Inject 1 mg into the skin once a week.   tiZANidine  (ZANAFLEX ) 4 MG tablet Take 0.5 tablets (2 mg total) by mouth every 8 (eight) hours as needed for up to 10 doses for muscle spasms.     Allergies:   Relafen [nabumetone] and Penicillins   Social History   Socioeconomic History   Marital status: Single    Spouse name: Not on file   Number of children: Not on file   Years of education: 12   Highest education level: Not on file  Occupational History   Not on file  Tobacco Use   Smoking status: Never   Smokeless tobacco: Never  Vaping Use   Vaping status: Never Used  Substance and Sexual Activity   Alcohol use: No   Drug use: No   Sexual activity: Not on file  Other Topics Concern   Not on file  Social History Narrative   Not on file   Social Drivers of Health   Financial Resource Strain: Not on file  Food Insecurity: No Food Insecurity (04/08/2024)   Hunger Vital Sign    Worried About Running Out of Food in the Last Year: Never true    Ran Out of Food in the Last Year: Never true  Transportation Needs: No Transportation Needs (04/08/2024)   PRAPARE - Administrator, Civil Service (Medical): No    Lack of Transportation (Non-Medical): No  Physical Activity: Not on file  Stress: Not on file  Social Connections: Not on file     Family History: The patient's family history includes Breast cancer in her sister; Heart attack in her brother, brother, and  father; Lymphoma in her sister; Ovarian cancer in her sister; Stroke in her mother. There is no history of Colon cancer, Colon polyps, Esophageal cancer, Rectal cancer, or Stomach cancer.  ROS:   Review of Systems  Constitution: Negative for decreased appetite, fever and weight gain.  HENT: Negative for congestion, ear discharge, hoarse voice and sore throat.   Eyes: Negative for discharge, redness, vision loss in right eye and visual halos.  Cardiovascular: Negative for chest pain, dyspnea on exertion, leg swelling, orthopnea and palpitations.  Respiratory: Negative for cough, hemoptysis, shortness of breath and snoring.   Endocrine: Negative for heat intolerance and polyphagia.  Hematologic/Lymphatic: Negative for bleeding  problem. Does not bruise/bleed easily.  Skin: Negative for flushing, nail changes, rash and suspicious lesions.  Musculoskeletal: Negative for arthritis, joint pain, muscle cramps, myalgias, neck pain and stiffness.  Gastrointestinal: Negative for abdominal pain, bowel incontinence, diarrhea and excessive appetite.  Genitourinary: Negative for decreased libido, genital sores and incomplete emptying.  Neurological: Negative for brief paralysis, focal weakness, headaches and loss of balance.  Psychiatric/Behavioral: Negative for altered mental status, depression and suicidal ideas.  Allergic/Immunologic: Negative for HIV exposure and persistent infections.    EKGs/Labs/Other Studies Reviewed:    The following studies were reviewed today:   EKG:  The ekg ordered today demonstrates   Recent Labs: 11/12/2023: Magnesium  1.8 06/18/2024: ALT 14; BUN 15; Creatinine, Ser 0.84; Hemoglobin 13.4; Platelets 380; Potassium 4.5; Sodium 145  Recent Lipid Panel    Component Value Date/Time   CHOL 131 09/15/2024 1017   TRIG 49 09/15/2024 1017   HDL 49 09/15/2024 1017   CHOLHDL 2.7 09/15/2024 1017   LDLCALC 71 09/15/2024 1017    Physical Exam:    VS:  BP 132/80 (BP Location:  Right Arm, Patient Position: Sitting, Cuff Size: Normal)   Pulse 76   Ht 5' 3 (1.6 m)   Wt 165 lb 6.4 oz (75 kg)   LMP 09/30/2014   SpO2 97%   BMI 29.30 kg/m     Wt Readings from Last 3 Encounters:  09/17/24 165 lb 6.4 oz (75 kg)  09/02/24 167 lb (75.8 kg)  06/18/24 166 lb (75.3 kg)     GEN: Well nourished, well developed in no acute distress HEENT: Normal NECK: No JVD; No carotid bruits LYMPHATICS: No lymphadenopathy CARDIAC: S1S2 noted,RRR, no murmurs, rubs, gallops RESPIRATORY:  Clear to auscultation without rales, wheezing or rhonchi  ABDOMEN: Soft, non-tender, non-distended, +bowel sounds, no guarding. EXTREMITIES: No edema, No cyanosis, no clubbing MUSCULOSKELETAL:  No deformity  SKIN: Warm and dry NEUROLOGIC:  Alert and oriented x 3, non-focal PSYCHIATRIC:  Normal affect, good insight  ASSESSMENT:    1. Mixed hyperlipidemia   2. Coronary artery disease involving native coronary artery of native heart without angina pectoris   3. Primary hypertension   4. Type 2 diabetes mellitus with hyperosmolarity without coma, without long-term current use of insulin  (HCC)     PLAN:    Coronary artery disease -  no longer with chest pain.  - Continue aspirin . - Use nitroglycerin  as needed for chest pain,  Hyperlipidemia -  LDL cholesterol decreased to 71 mg/dL, not at goal of less than 55 mg/dL. Discussed adding Zetia . - Continue Crestor  40 mg daily. - Consider adding Zetia  to achieve LDL goal of less than 55 mg/dL.  Hypertension Blood pressure managed with Benicar . - Continue Benicar  daily.  Type 2 diabetes mellitus Increased appetite, needs Ozempic  refill. Dose recently increased. - Continue Jardiance  daily. - Contact office for Ozempic  refill.  Obesity Increased appetite despite Ozempic . Dose recently increased. - Continue Ozempic  with recent dose adjustment   Follow-up in 6 months.  The patient is in agreement with the above plan. The patient left the  office in stable condition.  The patient will follow up in   Medication Adjustments/Labs and Tests Ordered: Current medicines are reviewed at length with the patient today.  Concerns regarding medicines are outlined above.  Orders Placed This Encounter  Procedures   Lipid panel   Lipoprotein A (LPA)   Meds ordered this encounter  Medications   ezetimibe  (ZETIA ) 10 MG tablet    Sig: Take 1 tablet (  10 mg total) by mouth daily.    Dispense:  90 tablet    Refill:  3    Patient Instructions  Medication Instructions:  Your physician has recommended you make the following change in your medication:  START: Zetia  10 mg once daily  *If you need a refill on your cardiac medications before your next appointment, please call your pharmacy*  Lab Work: In 12 weeks: Lipids, Lp(a) If you have labs (blood work) drawn today and your tests are completely normal, you will receive your results only by: MyChart Message (if you have MyChart) OR A paper copy in the mail If you have any lab test that is abnormal or we need to change your treatment, we will call you to review the results.   Follow-Up: At [facility], you and your health needs are our priority.  As part of our continuing mission to provide you with exceptional heart care, our providers are all part of one team.  This team includes your primary Cardiologist (physician) and Advanced Practice Providers or APPs (Physician Assistants and Nurse Practitioners) who all work together to provide you with the care you need, when you need it.  Your next appointment:   1 year(s)  Provider:   Nnamdi Dacus, DO             Adopting a Healthy Lifestyle.  Know what a healthy weight is for you (roughly BMI <25) and aim to maintain this   Aim for 7+ servings of fruits and vegetables daily   65-80+ fluid ounces of water or unsweet tea for healthy kidneys   Limit to max 1 drink of alcohol per day; avoid smoking/tobacco   Limit animal  fats in diet for cholesterol and heart health - choose grass fed whenever available   Avoid highly processed foods, and foods high in saturated/trans fats   Aim for low stress - take time to unwind and care for your mental health   Aim for 150 min of moderate intensity exercise weekly for heart health, and weights twice weekly for bone health   Aim for 7-9 hours of sleep daily   When it comes to diets, agreement about the perfect plan isnt easy to find, even among the experts. Experts at the [facility] [employer] developed an idea known as the Healthy Eating Plate. Just imagine a plate divided into logical, healthy portions.   The emphasis is on diet quality:   Load up on vegetables and fruits - one-half of your plate: Aim for color and variety, and remember that potatoes dont count.   Go for whole grains - one-quarter of your plate: Whole wheat, barley, wheat berries, quinoa, oats, brown rice, and foods made with them. If you want pasta, go with whole wheat pasta.   Protein power - one-quarter of your plate: Fish, chicken, beans, and nuts are all healthy, versatile protein sources. Limit red meat.   The diet, however, does go beyond the plate, offering a few other suggestions.   Use healthy plant oils, such as olive, canola, soy, corn, sunflower and peanut. Check the labels, and avoid partially hydrogenated oil, which have unhealthy trans fats.   If youre thirsty, drink water. Coffee and tea are good in moderation, but skip sugary drinks and limit milk and dairy products to one or two daily servings.   The type of carbohydrate in the diet is more important than the amount. Some sources of carbohydrates, such as vegetables, fruits, whole grains, and  beans-are healthier than others.   Finally, stay active  Signed, Dub Huntsman, DO  09/19/2024 9:13 AM     [facility]

## 2024-09-22 ENCOUNTER — Ambulatory Visit: Payer: Self-pay | Admitting: Cardiology

## 2024-09-24 ENCOUNTER — Other Ambulatory Visit: Payer: Self-pay | Admitting: Nurse Practitioner

## 2024-10-21 ENCOUNTER — Encounter: Payer: Self-pay | Admitting: Nurse Practitioner

## 2024-10-21 ENCOUNTER — Ambulatory Visit: Payer: PRIVATE HEALTH INSURANCE | Admitting: Nurse Practitioner

## 2024-10-21 VITALS — BP 130/78 | HR 70 | Temp 99.0°F | Ht 63.0 in | Wt 167.8 lb

## 2024-10-21 DIAGNOSIS — E1159 Type 2 diabetes mellitus with other circulatory complications: Secondary | ICD-10-CM | POA: Diagnosis not present

## 2024-10-21 DIAGNOSIS — E7800 Pure hypercholesterolemia, unspecified: Secondary | ICD-10-CM | POA: Diagnosis not present

## 2024-10-21 DIAGNOSIS — I119 Hypertensive heart disease without heart failure: Secondary | ICD-10-CM

## 2024-10-21 MED ORDER — SEMAGLUTIDE (1 MG/DOSE) 4 MG/3ML ~~LOC~~ SOPN: 1.0000 mg | PEN_INJECTOR | SUBCUTANEOUS | 1 refills | Status: AC

## 2024-10-21 NOTE — Progress Notes (Signed)
 LILLETTE Kristeen JINNY Gladis, CMA,acting as a neurosurgeon for [name], FNP.,have documented all relevant documentation on the behalf of [name], FNP,as directed by  [name], FNP while in the presence of [name], FNP.  Subjective:  Patient ID: Mary Holt , female    DOB: 1961/07/13 , 63 y.o.   MRN: [id number]  Chief Complaint  Patient presents with   Hypertension    Patient presents today for a bp and dm follow up, Patient reports compliance with medication. Patient denies any chest pain, SOB, or headaches. Patient has no concerns today.     HPI Discussed the use of AI scribe software for clinical note transcription with the patient, who gave verbal consent to proceed.  History of Present Illness Mary Holt is a 63 year old female with diabetes who presents for a follow-up visit.  Her A1c was down to seven at the last visit. She has not been checking her blood sugar at home recently due to personal circumstances, including the passing of her oldest brother in late September.  She recently picked up her Ozempic  prescription but accidentally discarded it, mistaking it for an old one. She took one shot during the week her brother passed away, which was in late September, and has not taken any since. She is concerned about the timing of her next prescription due to insurance coverage and cost. She is currently on a one milligram dose of Ozempic .  She has been walking more regularly, except for the recent rainy week, and notes a slight weight loss. She does not think she has swelling in her feet or ankles. She is still working, although she wants to retire soon.     Hypertension This is a chronic problem. The current episode started more than 1 year ago. The problem has been gradually improving since onset. The problem is controlled. Pertinent negatives include no anxiety, chest pain, headaches or palpitations. There are no associated agents to hypertension. Risk factors for coronary  artery disease include dyslipidemia, sedentary lifestyle and obesity. Past treatments include angiotensin blockers. There are no compliance problems.  There is no history of angina. There is no history of chronic renal disease.  Diabetes She presents for her follow-up diabetic visit. She has type 2 diabetes mellitus. Pertinent negatives for hypoglycemia include no headaches. Pertinent negatives for diabetes include no chest pain, no polydipsia, no polyphagia and no polyuria. There are no hypoglycemic complications. There are no diabetic complications. Risk factors for coronary artery disease include obesity and sedentary lifestyle. Current diabetic treatment includes oral agent (dual therapy). She has not had a previous visit with a dietitian. She participates in exercise daily (depending on weather). (Not checking her blood sugar regularly) An ACE inhibitor/angiotensin II receptor blocker is being taken. She does not see a podiatrist.Eye exam is not current.     Past Medical History:  Diagnosis Date   Angina pectoris 02/22/2020   CAD in native artery 02/22/2020   Chest pain of uncertain etiology 01/06/2020   Diabetes mellitus without complication (HCC)    Heart murmur    asymptomatic   Hyperlipidemia    Hypertension    Kidney stones    Kidney stones    several times, last  one 2014   Mitral valve prolapse    MVP (mitral valve prolapse)    Pure hypercholesterolemia 05/02/2020   Snoring 01/06/2020     Family History  Problem Relation Age of Onset   Stroke Mother    Heart attack Father  Breast cancer Sister    Heart attack Brother    Ovarian cancer Sister    Lymphoma Sister    Heart attack Brother    Colon cancer Neg Hx    Colon polyps Neg Hx    Esophageal cancer Neg Hx    Rectal cancer Neg Hx    Stomach cancer Neg Hx      Current Outpatient Medications:    aspirin  EC 81 MG tablet, Take 1 tablet (81 mg total) by mouth daily., Disp: 90 tablet, Rfl: 3   benzonatate  (TESSALON  PERLES)  100 MG capsule, Take 1 capsule (100 mg total) by mouth 3 (three) times daily as needed., Disp: 30 capsule, Rfl: 0   blood glucose meter kit and supplies KIT, Dispense based on patient and insurance preference. Use up to four times daily as directed., Disp: 1 each, Rfl: 0   Blood Glucose Monitoring Suppl DEVI, 1 each by Does not apply route in the morning, at noon, and at bedtime. May substitute to any manufacturer covered by patient's insurance., Disp: 1 each, Rfl: 0   ezetimibe  (ZETIA ) 10 MG tablet, Take 1 tablet (10 mg total) by mouth daily., Disp: 90 tablet, Rfl: 3   fenofibrate  (TRICOR ) 145 MG tablet, TAKE 1 TABLET(145 MG) BY MOUTH DAILY, Disp: 90 tablet, Rfl: 3   JARDIANCE  25 MG TABS tablet, TAKE 1 TABLET(25 MG) BY MOUTH DAILY BEFORE BREAKFAST, Disp: 90 tablet, Rfl: 1   nitroGLYCERIN  (NITROSTAT ) 0.4 MG SL tablet, Place 1 tablet (0.4 mg total) under the tongue every 5 (five) minutes as needed for chest pain., Disp: 20 tablet, Rfl: 3   olmesartan  (BENICAR ) 40 MG tablet, TAKE 1 TABLET(40 MG) BY MOUTH DAILY, Disp: 90 tablet, Rfl: 1   oxyCODONE -acetaminophen  (PERCOCET/ROXICET) 5-325 MG tablet, Take 1 tablet by mouth every 8 (eight) hours as needed for up to 9 doses for severe pain (pain score 7-10)., Disp: 9 tablet, Rfl: 0   rosuvastatin  (CRESTOR ) 40 MG tablet, TAKE 1 TABLET BY MOUTH  DAILY AT 6 PM, Disp: 90 tablet, Rfl: 3   diazepam  (VALIUM ) 5 MG tablet, Take 1 tablet (5 mg total) by mouth every 6 (six) hours as needed for muscle spasms. (Patient not taking: Reported on 10/21/2024), Disp: 30 tablet, Rfl: 0   oxyCODONE  (OXY IR/ROXICODONE ) 5 MG immediate release tablet, 1 or 2 tablets every 4 hours as needed for severe pain (Patient not taking: Reported on 10/21/2024), Disp: 40 tablet, Rfl: 0   Semaglutide , 1 MG/DOSE, 4 MG/3ML SOPN, Inject 1 mg into the skin once a week., Disp: 9 mL, Rfl: 1   tiZANidine  (ZANAFLEX ) 4 MG tablet, Take 0.5 tablets (2 mg total) by mouth every 8 (eight) hours as needed for up  to 10 doses for muscle spasms. (Patient not taking: Reported on 10/21/2024), Disp: 5 tablet, Rfl: 0   Allergies  Allergen Reactions   Relafen [Nabumetone] Rash   Penicillins Rash     Review of Systems  Constitutional: Negative.   HENT: Negative.    Eyes: Negative.   Respiratory: Negative.    Cardiovascular: Negative.  Negative for chest pain, palpitations and leg swelling.  Gastrointestinal: Negative.   Endocrine: Negative for polydipsia, polyphagia and polyuria.  Neurological: Negative.  Negative for headaches.  Psychiatric/Behavioral: Negative.       Today's Vitals   10/21/24 0933  BP: 130/78  Pulse: 70  Temp: 99 F (37.2 C)  TempSrc: Oral  Weight: 167 lb 12.8 oz (76.1 kg)  Height: 5' 3 (1.6 m)  PainSc: 0-No  pain   Body mass index is 29.72 kg/m.  Wt Readings from Last 3 Encounters:  10/21/24 167 lb 12.8 oz (76.1 kg)  09/17/24 165 lb 6.4 oz (75 kg)  09/02/24 167 lb (75.8 kg)      Objective:  Physical Exam Vitals and nursing note reviewed.  Constitutional:      General: She is not in acute distress.    Appearance: Normal appearance. She is well-developed. She is obese.  HENT:     Head: Normocephalic and atraumatic.     Right Ear: Hearing normal.     Left Ear: Hearing normal.  Eyes:     General: Lids are normal.     Pupils: Pupils are equal, round, and reactive to light.     Funduscopic exam:    Right eye: No papilledema.        Left eye: No papilledema.  Neck:     Thyroid : No thyroid  mass.     Vascular: No carotid bruit.  Cardiovascular:     Rate and Rhythm: Normal rate and regular rhythm.     Pulses: Normal pulses.     Heart sounds: Normal heart sounds. No murmur heard. Pulmonary:     Effort: Pulmonary effort is normal. No respiratory distress.     Breath sounds: Normal breath sounds. No wheezing.  Musculoskeletal:     Cervical back: Full passive range of motion without pain.     Left lower leg: No edema.  Skin:    General: Skin is warm and dry.      Capillary Refill: Capillary refill takes less than 2 seconds.  Neurological:     General: No focal deficit present.     Mental Status: She is alert and oriented to person, place, and time.     Cranial Nerves: No cranial nerve deficit.     Sensory: No sensory deficit.  Psychiatric:        Mood and Affect: Mood normal.        Behavior: Behavior normal.        Thought Content: Thought content normal.        Judgment: Judgment normal.      Assessment And Plan:  Type 2 diabetes mellitus with other circulatory complication, without long-term current use of insulin  (HCC) Assessment & Plan: A1c previously reduced to 7. Inconsistent glucose monitoring and missed semaglutide  doses due to personal issues. Slight weight loss and increased activity noted. Concerns about insurance coverage for early refill. Current dose 1 mg with potential nausea risk if increased rapidly. - Send semaglutide  prescription to pharmacy. - Attempt to obtain semaglutide  sample for pickup. - Check labs for diabetes management status. - Encouraged continued physical activity and regular glucose monitoring.  Orders: -     Hemoglobin A1c -     CMP14+EGFR -     Semaglutide  (1 MG/DOSE); Inject 1 mg into the skin once a week.  Dispense: 9 mL; Refill: 1  Hypertensive heart disease without heart failure Assessment & Plan: Blood pressure is well controlled.  Continue current medications and follow-up with cardiology.   Orders: -     CMP14+EGFR  Pure hypercholesterolemia Assessment & Plan: Chronic, continue statin.  Cholesterol levels are improving.  Orders: -     CMP14+EGFR    Return for controlled DM check 4 months.  Patient was given opportunity to ask questions. Patient verbalized understanding of the plan and was able to repeat key elements of the plan. All questions were answered to their satisfaction.  LILLETTE [name], FNP, have reviewed all documentation for this visit. The documentation on 10/21/24  for the exam, diagnosis, procedures, and orders are all accurate and complete.   IF YOU HAVE BEEN REFERRED TO A SPECIALIST, IT MAY TAKE 1-2 WEEKS TO SCHEDULE/PROCESS THE REFERRAL. IF YOU HAVE NOT HEARD FROM US /SPECIALIST IN TWO WEEKS, PLEASE GIVE US  A CALL AT [phone].

## 2024-10-22 LAB — CMP14+EGFR
ALT: 15 IU/L (ref 0–32)
AST: 18 IU/L (ref 0–40)
Albumin: 4.7 g/dL (ref 3.9–4.9)
Alkaline Phosphatase: 70 IU/L (ref 49–135)
BUN/Creatinine Ratio: 24 (ref 12–28)
BUN: 21 mg/dL (ref 8–27)
Bilirubin Total: 0.2 mg/dL (ref 0.0–1.2)
CO2: 22 mmol/L (ref 20–29)
Calcium: 9.7 mg/dL (ref 8.7–10.3)
Chloride: 106 mmol/L (ref 96–106)
Creatinine, Ser: 0.86 mg/dL (ref 0.57–1.00)
Globulin, Total: 2.7 g/dL (ref 1.5–4.5)
Glucose: 119 mg/dL — ABNORMAL HIGH (ref 70–99)
Potassium: 4.8 mmol/L (ref 3.5–5.2)
Sodium: 143 mmol/L (ref 134–144)
Total Protein: 7.4 g/dL (ref 6.0–8.5)
eGFR: 75 mL/min/1.73 (ref >59)

## 2024-10-22 LAB — HEMOGLOBIN A1C
Est. average glucose Bld gHb Est-mCnc: 169 mg/dL
Hgb A1c MFr Bld: 7.5 % — ABNORMAL HIGH (ref 4.8–5.6)

## 2024-11-01 ENCOUNTER — Ambulatory Visit: Payer: Self-pay | Admitting: Nurse Practitioner

## 2024-11-01 NOTE — Assessment & Plan Note (Signed)
 Blood pressure is well-controlled.  Continue current medications and follow-up with cardiology.

## 2024-11-01 NOTE — Assessment & Plan Note (Signed)
 A1c previously reduced to 7. Inconsistent glucose monitoring and missed semaglutide  doses due to personal issues. Slight weight loss and increased activity noted. Concerns about insurance coverage for early refill. Current dose 1 mg with potential nausea risk if increased rapidly. - Send semaglutide  prescription to pharmacy. - Attempt to obtain semaglutide  sample for pickup. - Check labs for diabetes management status. - Encouraged continued physical activity and regular glucose monitoring.

## 2024-11-01 NOTE — Assessment & Plan Note (Signed)
 Chronic, continue statin.  Cholesterol levels are improving.

## 2024-11-05 ENCOUNTER — Ambulatory Visit: Payer: Self-pay

## 2024-11-05 NOTE — Telephone Encounter (Signed)
 FYI Only or Action Required?: FYI only for provider: appointment scheduled on 11/18.  Patient was last seen in primary care on 10/21/2024 by Georgina Speaks, FNP.  Called Nurse Triage reporting Leg Pain.  Symptoms began about a month ago.  Interventions attempted: OTC medications: tylenol .  Symptoms are: gradually worsening .  Triage Disposition: See PCP When Office is Open (Within 3 Days)  Patient/caregiver understands and will follow disposition?: Yes  Copied from CRM #[id number]. Topic: Clinical - Red Word Triage >> Nov 05, 2024  3:45 PM Wess RAMAN wrote: Red Word that prompted transfer to Nurse Triage: Veins in left leg has been hurting for over a month. Hurts more when bending to grab something from the floor.  Reason for Disposition  [1] MODERATE pain (e.g., interferes with normal activities, limping) AND [2] present > 3 days  Answer Assessment - Initial Assessment Questions Left leg towards the outside of her knee.  Feels like her veins might be hurting. Denies fever, color change , temp change, denies swelling , knot or protrusion. Worse when bending knee. Has been going on for at least a month if not longer.   Back surgery earlier this year- using tylenol - advised to try ibuprofen   PCP appt tues 11/18 and waitlist to be seen sooner. Agrees to ED/UC precautions.  1. ONSET: When did the pain start?      A month or more ago  2. LOCATION: Where is the pain located?      Left leg- outside of left leg  3. PAIN: How bad is the pain?    (Scale 1-10; or mild, moderate, severe)     Worse when knee is bent- 5/10 , mild at rest 2/10  4. WORK OR EXERCISE: Has there been any recent work or exercise that involved this part of the body?      Stands all day for work- no injury that she is aware of  5. CAUSE: What do you think is causing the leg pain?     Feels like the veins in her leg 6. OTHER SYMPTOMS: Do you have any other symptoms? (e.g., chest pain, back pain, breathing  difficulty, swelling, rash, fever, numbness, weakness)     Denies any of the above  Protocols used: Leg Pain-A-AH

## 2024-11-05 NOTE — Telephone Encounter (Signed)
 This RN attempted to contact pt. No answer, left VM with call back number.    Copied from CRM [id number]. Topic: Clinical - Red Word Triage >> Nov 05, 2024  3:45 PM Wess RAMAN wrote: Red Word that prompted transfer to Nurse Triage: Veins in left leg has been hurting for over a month. Hurts more when bending to grab something from the floor.

## 2024-11-10 ENCOUNTER — Ambulatory Visit: Payer: PRIVATE HEALTH INSURANCE | Admitting: Nurse Practitioner

## 2024-11-10 ENCOUNTER — Encounter: Payer: Self-pay | Admitting: Nurse Practitioner

## 2024-11-10 VITALS — BP 110/68 | HR 100 | Temp 98.8°F | Ht 63.0 in | Wt 166.0 lb

## 2024-11-10 DIAGNOSIS — E663 Overweight: Secondary | ICD-10-CM | POA: Diagnosis not present

## 2024-11-10 DIAGNOSIS — Z6829 Body mass index (BMI) 29.0-29.9, adult: Secondary | ICD-10-CM | POA: Diagnosis not present

## 2024-11-10 DIAGNOSIS — I83812 Varicose veins of left lower extremities with pain: Secondary | ICD-10-CM | POA: Diagnosis not present

## 2024-11-10 NOTE — Progress Notes (Signed)
 LILLETTE Kristeen JINNY Gladis, CMA,acting as a neurosurgeon for Mary Ada, FNP.,have documented all relevant documentation on the behalf of Mary Ada, FNP,as directed by  Mary Ada, FNP while in the presence of Mary Ada, FNP.  Subjective:  Patient ID: Mary Holt , female    DOB: Aug 26, 1961 , 63 y.o.   MRN: [id number]  Chief Complaint  Patient presents with   Varicose Veins    Patient presents today for left leg pain. Patient state the pain has been going on for the past few month. Patient reports she feels as if its the veins in her leg, she reports it is worse when bending down. Patient states she has varicose veins.      HPI  Discussed the use of AI scribe software for clinical note transcription with the patient, who gave verbal consent to proceed.  History of Present Illness Mary Holt is a 63 year old female who presents with left leg pain associated with varicose veins.  She has been experiencing low-grade, intermittent pain in her left leg for at least two to three months, which she associates with her varicose veins. The pain intensifies when kneeling, particularly on her left knee, and is described as a 'stretching out' sensation, more pronounced with pressure on the knee.  She has not previously consulted a vascular specialist for her varicose veins. She has not used any medications or pain creams specifically for the leg pain, although she has used Tylenol  in the past for back pain, which has since improved, reducing her need for it.  She occasionally wears compression socks, especially with weather changes, but not consistently. The pain is localized to the left leg, with no similar issues in the right leg. No redness or swelling in her legs.  Past Medical History:  Diagnosis Date   Angina pectoris 02/22/2020   CAD in native artery 02/22/2020   Chest pain of uncertain etiology 01/06/2020   Diabetes mellitus without complication (HCC)    Heart murmur    asymptomatic    Hyperlipidemia    Hypertension    Kidney stones    Kidney stones    several times, last  one 2014   Mitral valve prolapse    MVP (mitral valve prolapse)    Pure hypercholesterolemia 05/02/2020   Snoring 01/06/2020     Family History  Problem Relation Age of Onset   Stroke Mother    Heart attack Father    Breast cancer Sister    Heart attack Brother    Ovarian cancer Sister    Lymphoma Sister    Heart attack Brother    Colon cancer Neg Hx    Colon polyps Neg Hx    Esophageal cancer Neg Hx    Rectal cancer Neg Hx    Stomach cancer Neg Hx      Current Outpatient Medications:    aspirin  EC 81 MG tablet, Take 1 tablet (81 mg total) by mouth daily., Disp: 90 tablet, Rfl: 3   benzonatate  (TESSALON  PERLES) 100 MG capsule, Take 1 capsule (100 mg total) by mouth 3 (three) times daily as needed., Disp: 30 capsule, Rfl: 0   blood glucose meter kit and supplies KIT, Dispense based on patient and insurance preference. Use up to four times daily as directed., Disp: 1 each, Rfl: 0   Blood Glucose Monitoring Suppl DEVI, 1 each by Does not apply route in the morning, at noon, and at bedtime. May substitute to any manufacturer covered by patient's insurance., Disp: 1  each, Rfl: 0   ezetimibe  (ZETIA ) 10 MG tablet, Take 1 tablet (10 mg total) by mouth daily., Disp: 90 tablet, Rfl: 3   fenofibrate  (TRICOR ) 145 MG tablet, TAKE 1 TABLET(145 MG) BY MOUTH DAILY, Disp: 90 tablet, Rfl: 3   JARDIANCE  25 MG TABS tablet, TAKE 1 TABLET(25 MG) BY MOUTH DAILY BEFORE BREAKFAST, Disp: 90 tablet, Rfl: 1   nitroGLYCERIN  (NITROSTAT ) 0.4 MG SL tablet, Place 1 tablet (0.4 mg total) under the tongue every 5 (five) minutes as needed for chest pain., Disp: 20 tablet, Rfl: 3   olmesartan  (BENICAR ) 40 MG tablet, TAKE 1 TABLET(40 MG) BY MOUTH DAILY, Disp: 90 tablet, Rfl: 1   oxyCODONE -acetaminophen  (PERCOCET/ROXICET) 5-325 MG tablet, Take 1 tablet by mouth every 8 (eight) hours as needed for up to 9 doses for severe pain (pain  score 7-10)., Disp: 9 tablet, Rfl: 0   rosuvastatin  (CRESTOR ) 40 MG tablet, TAKE 1 TABLET BY MOUTH  DAILY AT 6 PM, Disp: 90 tablet, Rfl: 3   Semaglutide , 1 MG/DOSE, 4 MG/3ML SOPN, Inject 1 mg into the skin once a week., Disp: 9 mL, Rfl: 1   diazepam  (VALIUM ) 5 MG tablet, Take 1 tablet (5 mg total) by mouth every 6 (six) hours as needed for muscle spasms. (Patient not taking: Reported on 11/10/2024), Disp: 30 tablet, Rfl: 0   oxyCODONE  (OXY IR/ROXICODONE ) 5 MG immediate release tablet, 1 or 2 tablets every 4 hours as needed for severe pain (Patient not taking: Reported on 11/10/2024), Disp: 40 tablet, Rfl: 0   tiZANidine  (ZANAFLEX ) 4 MG tablet, Take 0.5 tablets (2 mg total) by mouth every 8 (eight) hours as needed for up to 10 doses for muscle spasms. (Patient not taking: Reported on 11/10/2024), Disp: 5 tablet, Rfl: 0   Allergies  Allergen Reactions   Relafen [Nabumetone] Rash   Penicillins Rash     Review of Systems  Constitutional: Negative.   HENT: Negative.    Eyes: Negative.   Respiratory: Negative.    Cardiovascular: Negative.  Negative for chest pain, palpitations and leg swelling.  Gastrointestinal: Negative.   Endocrine: Negative for polydipsia, polyphagia and polyuria.  Neurological: Negative.  Negative for headaches.  Psychiatric/Behavioral: Negative.       Today's Vitals   11/10/24 1435  BP: 110/68  Pulse: 100  Temp: 98.8 F (37.1 C)  TempSrc: Oral  Weight: 166 lb (75.3 kg)  Height: 5' 3 (1.6 m)  PainSc: 3   PainLoc: Leg   Body mass index is 29.41 kg/m.  Wt Readings from Last 3 Encounters:  11/10/24 166 lb (75.3 kg)  10/21/24 167 lb 12.8 oz (76.1 kg)  09/17/24 165 lb 6.4 oz (75 kg)      Objective:  Physical Exam Vitals and nursing note reviewed.  Constitutional:      General: She is not in acute distress.    Appearance: Normal appearance. She is well-developed.  HENT:     Head: Normocephalic and atraumatic.     Right Ear: Hearing normal.     Left  Ear: Hearing normal.  Eyes:     General: Lids are normal.     Pupils: Pupils are equal, round, and reactive to light.     Funduscopic exam:    Right eye: No papilledema.        Left eye: No papilledema.  Neck:     Thyroid : No thyroid  mass.  Pulmonary:     Effort: Pulmonary effort is normal. No respiratory distress.  Musculoskeletal:     Cervical  back: Full passive range of motion without pain.     Left lower leg: No edema.  Skin:    General: Skin is warm and dry.     Capillary Refill: Capillary refill takes less than 2 seconds.     Comments: Bilateral lower extremity varicose veins, left lateral leg tender to touch, wearing compression socks,  Neurological:     General: No focal deficit present.     Mental Status: She is alert and oriented to person, place, and time.     Cranial Nerves: No cranial nerve deficit.     Sensory: No sensory deficit.  Psychiatric:        Mood and Affect: Mood normal.        Behavior: Behavior normal.        Thought Content: Thought content normal.        Judgment: Judgment normal.       Assessment And Plan:   Assessment & Plan Varicose veins of left lower extremity with pain  Overweight with body mass index (BMI) of 29 to 29.9 in adult   Orders Placed This Encounter  Procedures   Ambulatory referral to Vascular Surgery   Assessment & Plan Varicose veins of left lower extremity with pain Chronic varicose veins with intermittent pain, no redness or swelling. No prior vascular evaluation. - Referred to vascular specialist for evaluation and potential intervention. - Advised wearing compression socks during prolonged standing, remove at night. - Recommended over-the-counter pain cream for symptomatic relief.  Type 2 diabetes mellitus with circulatory complication A1c increased to 7.5. Current treatment with semaglutide . Kidney function well-managed. - Continue semaglutide  (Ozempic ) 1 mg weekly. - Monitor A1c levels at the next  visit.   Return for controlled DM check 4 months.  Patient was given opportunity to ask questions. Patient verbalized understanding of the plan and was able to repeat key elements of the plan. All questions were answered to their satisfaction.    LILLETTE Mary Ada, FNP, have reviewed all documentation for this visit. The documentation on 11/10/24 for the exam, diagnosis, procedures, and orders are all accurate and complete.   IF YOU HAVE BEEN REFERRED TO A SPECIALIST, IT MAY TAKE 1-2 WEEKS TO SCHEDULE/PROCESS THE REFERRAL. IF YOU HAVE NOT HEARD FROM US /SPECIALIST IN TWO WEEKS, PLEASE GIVE US  A CALL AT [phone].

## 2024-12-08 LAB — LIPID PANEL
Chol/HDL Ratio: 2.9 ratio (ref 0.0–4.4)
Cholesterol, Total: 109 mg/dL (ref 100–199)
HDL: 38 mg/dL — ABNORMAL LOW (ref >39)
LDL Chol Calc (NIH): 60 mg/dL (ref 0–99)
Triglycerides: 44 mg/dL (ref 0–149)
VLDL Cholesterol Cal: 11 mg/dL (ref 5–40)

## 2024-12-08 LAB — LIPOPROTEIN A (LPA): Lipoprotein (a): 93.8 nmol/L — ABNORMAL HIGH (ref <75.0)

## 2024-12-16 ENCOUNTER — Ambulatory Visit: Payer: Self-pay | Admitting: Cardiology

## 2024-12-22 ENCOUNTER — Other Ambulatory Visit: Payer: Self-pay | Admitting: Cardiovascular Disease

## 2024-12-22 NOTE — Telephone Encounter (Signed)
Letter sent to last known address

## 2024-12-23 MED ORDER — FENOFIBRATE 145 MG PO TABS: 145.0000 mg | ORAL_TABLET | Freq: Every day | ORAL | 2 refills | Status: AC

## 2025-01-12 ENCOUNTER — Other Ambulatory Visit: Payer: Self-pay | Admitting: Nurse Practitioner

## 2025-01-12 DIAGNOSIS — E669 Obesity, unspecified: Secondary | ICD-10-CM

## 2025-04-13 ENCOUNTER — Ambulatory Visit (HOSPITAL_COMMUNITY): Payer: PRIVATE HEALTH INSURANCE

## 2025-06-21 ENCOUNTER — Encounter: Payer: Self-pay | Admitting: Nurse Practitioner
